# Patient Record
Sex: Male | Born: 1940 | Race: White | Hispanic: No | Marital: Married | Smoking: Former smoker
Health system: Southern US, Community
[De-identification: ages and names within clinical notes are randomized; demographics above are authoritative.]

## PROBLEM LIST (undated history)

## (undated) DIAGNOSIS — I10 Essential (primary) hypertension: Secondary | ICD-10-CM

## (undated) DIAGNOSIS — N210 Calculus in bladder: Secondary | ICD-10-CM

## (undated) DIAGNOSIS — Z87442 Personal history of urinary calculi: Secondary | ICD-10-CM

## (undated) DIAGNOSIS — K635 Polyp of colon: Secondary | ICD-10-CM

## (undated) DIAGNOSIS — H269 Unspecified cataract: Secondary | ICD-10-CM

## (undated) DIAGNOSIS — J302 Other seasonal allergic rhinitis: Secondary | ICD-10-CM

## (undated) DIAGNOSIS — K449 Diaphragmatic hernia without obstruction or gangrene: Secondary | ICD-10-CM

## (undated) DIAGNOSIS — G4730 Sleep apnea, unspecified: Secondary | ICD-10-CM

## (undated) DIAGNOSIS — N400 Enlarged prostate without lower urinary tract symptoms: Secondary | ICD-10-CM

## (undated) DIAGNOSIS — Z8679 Personal history of other diseases of the circulatory system: Secondary | ICD-10-CM

## (undated) DIAGNOSIS — K2270 Barrett's esophagus without dysplasia: Secondary | ICD-10-CM

## (undated) DIAGNOSIS — T7840XA Allergy, unspecified, initial encounter: Secondary | ICD-10-CM

## (undated) DIAGNOSIS — Z974 Presence of external hearing-aid: Secondary | ICD-10-CM

## (undated) DIAGNOSIS — Z87898 Personal history of other specified conditions: Secondary | ICD-10-CM

## (undated) DIAGNOSIS — E785 Hyperlipidemia, unspecified: Secondary | ICD-10-CM

## (undated) DIAGNOSIS — M1990 Unspecified osteoarthritis, unspecified site: Secondary | ICD-10-CM

## (undated) DIAGNOSIS — T783XXA Angioneurotic edema, initial encounter: Secondary | ICD-10-CM

## (undated) DIAGNOSIS — G709 Myoneural disorder, unspecified: Secondary | ICD-10-CM

## (undated) DIAGNOSIS — C801 Malignant (primary) neoplasm, unspecified: Secondary | ICD-10-CM

## (undated) HISTORY — PX: VASECTOMY: SHX75

## (undated) HISTORY — DX: Polyp of colon: K63.5

## (undated) HISTORY — PX: BLADDER STONE REMOVAL: SHX568

## (undated) HISTORY — DX: Diaphragmatic hernia without obstruction or gangrene: K44.9

## (undated) HISTORY — DX: Allergy, unspecified, initial encounter: T78.40XA

## (undated) HISTORY — DX: Unspecified cataract: H26.9

## (undated) HISTORY — DX: Malignant (primary) neoplasm, unspecified: C80.1

## (undated) HISTORY — DX: Unspecified osteoarthritis, unspecified site: M19.90

## (undated) HISTORY — PX: UPPER GASTROINTESTINAL ENDOSCOPY: SHX188

## (undated) HISTORY — DX: Angioneurotic edema, initial encounter: T78.3XXA

## (undated) HISTORY — PX: FRACTURE SURGERY: SHX138

## (undated) HISTORY — DX: Barrett's esophagus without dysplasia: K22.70

---

## 1974-11-07 HISTORY — PX: CERVICAL FUSION: SHX112

## 1999-04-12 ENCOUNTER — Ambulatory Visit (HOSPITAL_COMMUNITY): Admission: RE | Admit: 1999-04-12 | Discharge: 1999-04-12 | Payer: Self-pay | Admitting: *Deleted

## 2000-07-07 ENCOUNTER — Encounter: Payer: Self-pay | Admitting: Neurosurgery

## 2000-07-07 ENCOUNTER — Ambulatory Visit (HOSPITAL_COMMUNITY): Admission: RE | Admit: 2000-07-07 | Discharge: 2000-07-07 | Payer: Self-pay | Admitting: Neurosurgery

## 2001-11-07 HISTORY — PX: QUADRICEPS TENDON REPAIR: SHX756

## 2002-06-09 ENCOUNTER — Inpatient Hospital Stay (HOSPITAL_COMMUNITY): Admission: EM | Admit: 2002-06-09 | Discharge: 2002-06-12 | Payer: Self-pay | Admitting: Emergency Medicine

## 2002-06-09 ENCOUNTER — Encounter: Payer: Self-pay | Admitting: Orthopaedic Surgery

## 2003-03-24 ENCOUNTER — Ambulatory Visit (HOSPITAL_COMMUNITY): Admission: RE | Admit: 2003-03-24 | Discharge: 2003-03-24 | Payer: Self-pay | Admitting: *Deleted

## 2003-03-24 ENCOUNTER — Encounter (INDEPENDENT_AMBULATORY_CARE_PROVIDER_SITE_OTHER): Payer: Self-pay

## 2003-12-24 ENCOUNTER — Encounter (INDEPENDENT_AMBULATORY_CARE_PROVIDER_SITE_OTHER): Payer: Self-pay | Admitting: *Deleted

## 2003-12-24 ENCOUNTER — Ambulatory Visit (HOSPITAL_COMMUNITY): Admission: RE | Admit: 2003-12-24 | Discharge: 2003-12-24 | Payer: Self-pay | Admitting: *Deleted

## 2004-10-25 ENCOUNTER — Ambulatory Visit (HOSPITAL_COMMUNITY): Admission: RE | Admit: 2004-10-25 | Discharge: 2004-10-25 | Payer: Self-pay | Admitting: *Deleted

## 2004-10-25 ENCOUNTER — Encounter (INDEPENDENT_AMBULATORY_CARE_PROVIDER_SITE_OTHER): Payer: Self-pay | Admitting: *Deleted

## 2006-12-04 ENCOUNTER — Ambulatory Visit (HOSPITAL_COMMUNITY): Admission: RE | Admit: 2006-12-04 | Discharge: 2006-12-04 | Payer: Self-pay | Admitting: *Deleted

## 2006-12-04 ENCOUNTER — Encounter (INDEPENDENT_AMBULATORY_CARE_PROVIDER_SITE_OTHER): Payer: Self-pay | Admitting: Specialist

## 2008-10-20 ENCOUNTER — Encounter (INDEPENDENT_AMBULATORY_CARE_PROVIDER_SITE_OTHER): Payer: Self-pay | Admitting: *Deleted

## 2008-10-20 ENCOUNTER — Ambulatory Visit (HOSPITAL_COMMUNITY): Admission: RE | Admit: 2008-10-20 | Discharge: 2008-10-20 | Payer: Self-pay | Admitting: *Deleted

## 2008-11-07 DIAGNOSIS — K2270 Barrett's esophagus without dysplasia: Secondary | ICD-10-CM

## 2008-11-07 HISTORY — DX: Barrett's esophagus without dysplasia: K22.70

## 2010-07-14 ENCOUNTER — Ambulatory Visit (HOSPITAL_BASED_OUTPATIENT_CLINIC_OR_DEPARTMENT_OTHER): Admission: RE | Admit: 2010-07-14 | Discharge: 2010-07-14 | Payer: Self-pay | Admitting: Urology

## 2010-11-07 HISTORY — PX: EYE SURGERY: SHX253

## 2011-01-20 LAB — POCT I-STAT 4, (NA,K, GLUC, HGB,HCT)
Glucose, Bld: 105 mg/dL — ABNORMAL HIGH (ref 70–99)
HCT: 47 % (ref 39.0–52.0)
Hemoglobin: 16 g/dL (ref 13.0–17.0)
Potassium: 3.9 mEq/L (ref 3.5–5.1)
Sodium: 138 mEq/L (ref 135–145)

## 2011-02-14 ENCOUNTER — Other Ambulatory Visit: Payer: Self-pay | Admitting: Gastroenterology

## 2011-03-22 NOTE — Op Note (Signed)
NAMESHARIF, RENDELL NO.:  [id number]   MEDICAL RECORD NO.:  [id number]          PATIENT TYPE:  AMB   LOCATION:  ENDO                         FACILITY:  [facility]   PHYSICIAN:  Georgiana Spinner, M.D.    DATE OF BIRTH:  Feb 12, 1941   DATE OF PROCEDURE:  DATE OF DISCHARGE:                               OPERATIVE REPORT   PROCEDURE:  Upper endoscopy.   INDICATIONS:  GERD with Barrett esophagus.   ANESTHESIA:  Fentanyl 62.5 mcg, Versed 7 mg.   PROCEDURE:  With the patient mildly sedated in the left lateral  decubitus position the Pentax videoscopic endoscope was inserted in the  mouth and passed under direct vision through the esophagus which  appeared normal until we reached distal esophagus and there appeared to  be two areas of Barrett's, photographed and biopsied.  We entered into  the stomach fundus, body, antrum, duodenal bulb, second portion duodenum  and all appeared normal.  From this point, the endoscope was slowly  withdrawn taking circumferential views of duodenal mucosa until the  endoscope had been pulled back into stomach, placed in retroflexion to  view the stomach from below.  The endoscope was then straightened and  withdrawn taking circumferential views of the remaining gastric and  esophageal mucosa.  The patient's vital signs and pulse oximeter  remained stable.  The patient tolerated the procedure well without  apparent complication.   FINDINGS:  Question of Barrett esophagus biopsied.  Await biopsy report.  The patient will call me for results and follow-up with me as an  outpatient.  Proceed to colonoscopy as planned.           ______________________________  Georgiana Spinner, M.D.     GMO/MEDQ  D:  10/20/2008  T:  10/20/2008  Job:  [id number]   cc:   Jonita Albee, M.D.  Fax: [phone]

## 2011-03-22 NOTE — Op Note (Signed)
NAMEANEL, PUROHIT NO.:  [id number]   MEDICAL RECORD NO.:  [id number]          PATIENT TYPE:  AMB   LOCATION:  ENDO                         FACILITY:  [facility]   PHYSICIAN:  Georgiana Spinner, M.D.    DATE OF BIRTH:  [birth year]   DATE OF PROCEDURE:  DATE OF DISCHARGE:                               OPERATIVE REPORT   PROCEDURE:  Colonoscopy.   INDICATIONS:  1. Colon polyps.  2. Colon cancer screening.   ANESTHESIA:  1. Fentanyl 37.5 mcg.  2. Versed 3 mg.   PROCEDURE:  With the patient mildly sedated in the left lateral  decubitus position, a rectal examination was performed which was  unremarkable to my examination.  The prostate felt normal.  Subsequently, the Pentax videoscopic colonoscope was inserted in the  rectum and passed under direct vision to the cecum identified by  ileocecal valve and appendiceal orifice, both of which were  photographed.  From this point, the colonoscope was slowly withdrawn  taking circumferential views of colonic mucosa stopping only in the  rectum which appeared normal on direct and showed hemorrhoids on  retroflexed view.  The endoscope was straightened and withdrawn.  The  patient's vital signs and pulse oximeter remained stable.  The patient  tolerated the procedure well without apparent complications.   FINDINGS:  Internal hemorrhoids, otherwise unremarkable colonoscopic  examination to the cecum.   PLAN:  Repeat examination in 5 years           ______________________________  Georgiana Spinner, M.D.     GMO/MEDQ  D:  10/20/2008  T:  10/20/2008  Job:  [id number]   cc:   Jonita Albee, M.D.  Fax: [phone]

## 2011-03-25 NOTE — Op Note (Signed)
NAME:  Todd [name], Todd [name]                            ACCOUNT NO.:  [id number]   MEDICAL RECORD NO.:  [id number]                   PATIENT TYPE:  INP   LOCATION:  1823                                 FACILITY:  [facility]   PHYSICIAN:  Lubertha Basque. Jerl Santos, M.D.             DATE OF BIRTH:  01/14/1941   DATE OF PROCEDURE:  DATE OF DISCHARGE:                                 OPERATIVE REPORT   DATE OF PROCEDURE:  June 09, 2002   PREOPERATIVE DIAGNOSIS:  Right and left quadriceps tendon ruptures.   POSTOPERATIVE DIAGNOSIS:  Right and left quadriceps tendon ruptures.   OPERATION:  Right and left quadriceps tendon repairs.   ANESTHESIA:  General   SURGEON:  Lubertha Basque. Jerl Santos, M.D.   INDICATIONS FOR PROCEDURE:  The patient is a 70 year old man who fell down  his garage steps this afternoon and sustained bilateral complete quadriceps  tendon ruptures.  He was unable to stand.  [facility] was taken to [facility]  and then to the [facility] Emergency Room where  orthopedics was consulted for treatment.  He was offered bilateral  quadriceps tendon repairs in hopes that he might again walk.  The procedures  were discussed with the patient and informed operative consent was obtained  after discussion and possible complications of, reactions to anesthesia,  infection, and DVT.   DESCRIPTION OF PROCEDURE:  The patient was taken to the operating room suite  where general anesthesia was achieved without difficulty.  He was positioned  supine and prepped and draped in a normal sterile fashion.  After the  administration of preoperative IV antibiotics, the right leg was elevated,  exsanguinated, and a tourniquet inflated about the thigh.  A longitudinal  incision was made in the area of the rupture with dissection down to the  distal quadriceps and the patella.  Some small pieces of bone had been  avulsed with the tendon and these were excised.  The superior pole of the  tendon was then  cleared with a rongeur in the area of our intended repair.  Retinaculum was also torn in both directions.  The knee was thoroughly  irrigated to remove the hemarthrosis.  Ethi bon, #2 was placed in [place]  fashion in the distal quadriceps tendon.  Porous bands eminated for the  rupture down to this tendon.  These were then passed through a total of  three longitudinal drill holes in the patella.  The sutures were tied near  the inferior pole of the patella.  I repaired the retinaculum on both  aspects with #2 Ethibon as well.  The knee was flexed to 90 and the repair  appeared to be stable.  The tourniquet was deflated and the leg became pink  and warm immediately.  The wound was again irrigated, followed by  reapproximation of subcutaneous tissues with 2-0 undyed Vicryl and skin with  staples.  Marcaine  was injected about the wound, followed by Adaptic and a  dry gauze dressing with loose Ace wrap and a knee immobilizer.  At this  point, the left leg was elevated, exsanguinated, and a tourniquet inflated  about the thigh. An identical procedure was done here.  At the end, this was  injected with Marcaine at the end of the case and a sterile dressing and  knee immobilizer were applied.  Estimated blood loss and intraoperative  fluids can be obtained from anesthesia record as can accurate tourniquet  times.    DISPOSITION:  The patient was extubated in the operating room and taken to  the recovery room in stable condition.  Plans were for him to stay at least  overnight with probable discharge home in the morning once he is independent  with transfers and for crutch ambulation in his braces.                                                 Lubertha Basque Jerl Santos, M.D.    PGD/MEDQ  D:  06/09/2002  T:  06/13/2002  Job:  [id number]

## 2011-03-25 NOTE — Op Note (Signed)
NAMECHEYENNE, Todd [name] NO.:  [id number]   MEDICAL RECORD NO.:  [id number]          PATIENT TYPE:  AMB   LOCATION:  ENDO                         FACILITY:  [facility]   PHYSICIAN:  Georgiana Spinner, M.D.    DATE OF BIRTH:  09-02-1941   DATE OF PROCEDURE:  10/25/2004  DATE OF DISCHARGE:                                 OPERATIVE REPORT   PROCEDURE:  Upper endoscopy.   INDICATIONS FOR PROCEDURE:  GERD with known Barrett's esophagus.   ANESTHESIA:  Demerol 50, Versed 6 mg.   DESCRIPTION OF PROCEDURE:  With the patient mildly sedated in the left  lateral decubitus position, the Olympus videoscopic endoscope was inserted  in the mouth and passed under direct vision through the esophagus which  appeared normal until we reached the distal esophagus and then we  photographed this area and took biopsies of areas of presumed Barrett's.  We  entered into the stomach. The fundus, body, antrum, duodenal bulb and second  portion of the duodenum and were visualized. From this point, the endoscope  was slowly withdrawn taking circumferential views of the  duodenal mucosa  until the endoscope had been pulled back into the stomach, placed in  retroflexion to view the stomach from below. The endoscope was then  straightened and withdrawn taking circumferential views of the remaining  gastric and esophageal mucosa. The patient's vital signs and pulse oximeter  remained stable. The patient tolerated the procedure well without apparent  complications.   FINDINGS:  Hiatal hernia with Barrett's esophagus. Await biopsy report. The  patient will call me for results and followup with me as an outpatient.      GMO/MEDQ  D:  10/25/2004  T:  10/25/2004  Job:  [id number]

## 2011-03-25 NOTE — Op Note (Signed)
   NAME:  Todd [name], DOEDEN NO.:  [id number]   MEDICAL RECORD NO.:  [id number]                   PATIENT TYPE:  AMB   LOCATION:  ENDO                                 FACILITY:  [facility]   PHYSICIAN:  Georgiana Spinner, M.D.                 DATE OF BIRTH:  1941-07-25   DATE OF PROCEDURE:  DATE OF DISCHARGE:                                 OPERATIVE REPORT   PROCEDURE:  Upper endoscopy.   INDICATION:  GERD.   ANESTHESIA:  Demerol 60 mg, Versed 6 mg.   DESCRIPTION OF PROCEDURE:  With the patient mildly sedated in the left  lateral decubitus position, the Olympus videoscopic endoscope was inserted  into the mouth and passed under direct vision through the esophagus which  appeared normal except at the distal esophagus there were some changes of  possibly esophagitis photographed and biopsied.  We entered into the stomach  and the fundus, body, antrum, duodenal bulb, and second portion of the  duodenum all were visualized and photographed.  From this point, the  endoscope was slowly withdrawn, taking circumferential views of the duodenal  mucosa until the endoscope was then pulled into the stomach and placed in  retroflexion, viewing the stomach from below.  The endoscope was  straightened and withdrawn, taking circumferential views of the remaining  gastric and esophageal mucosa.  The patient's vital signs and pulse oximetry  remained stable and the patient tolerated the procedure well without  apparent complications.   FINDINGS:  Changes of distal esophagus as noted, biopsied.   PLAN:  Await biopsy report, the patient will call me for results and follow  up with me as an outpatient.  Proceed to colonoscopy as planned.                                               Georgiana Spinner, M.D.    GMO/MEDQ  D:  03/24/2003  T:  03/24/2003  Job:  [id number]   cc:   Jonita Albee, M.D.  [facility]  [address]  [place]  [place] [address]  Fax:  [phone]

## 2011-03-25 NOTE — Op Note (Signed)
   NAME:  Todd [name], Todd [name] NO.:  [id number]   MEDICAL RECORD NO.:  [id number]                   PATIENT TYPE:  AMB   LOCATION:  ENDO                                 FACILITY:  [facility]   PHYSICIAN:  Georgiana Spinner, M.D.                 DATE OF BIRTH:  Aug 16, 1941   DATE OF PROCEDURE:  DATE OF DISCHARGE:                                 OPERATIVE REPORT   PROCEDURE:  Colonoscopy.   INDICATION:  Colon polyp.   ANESTHESIA:  Demerol 20 mg, Versed 2 mg.   DESCRIPTION OF PROCEDURE:  With the patient mildly sedated in the left  lateral decubitus position, the Olympus videoscopic colonoscope was inserted  in the rectum and passed under direct vision to the cecum identified by the  crow's foot of the cecum and the ileocecal valve, both of which were  photographed.  From this point, the colonoscope was slowly withdrawn, taking  circumferential views of the entire colonic mucosa and stopping only in the  rectum which appeared normal on direct and retroflex view.  The endoscope  was straightened and withdrawn.  The patient's vital signs and pulse  oximetry remained stable.  The patient tolerated the procedure well without  apparent complications.   FINDINGS:  Unremarkable colonoscopic examination to the cecum.   PLAN:  Repeat examination possibly in five years.                                               Georgiana Spinner, M.D.    GMO/MEDQ  D:  03/24/2003  T:  03/24/2003  Job:  [id number]   cc:   Jonita Albee, M.D.  [facility]  [address]  [place]  [place] [address]  Fax: [phone]

## 2011-03-25 NOTE — Op Note (Signed)
NAMESTEADMAN, PROSPERI NO.:  [id number]   MEDICAL RECORD NO.:  [id number]          PATIENT TYPE:  AMB   LOCATION:  ENDO                         FACILITY:  [facility]   PHYSICIAN:  Georgiana Spinner, M.D.    DATE OF BIRTH:  1940-12-01   DATE OF PROCEDURE:  DATE OF DISCHARGE:                               OPERATIVE REPORT   PROCEDURE:  Upper endoscopy.   INDICATIONS:  GERD with Barrett's esophagus.   ANESTHESIA:  Fentanyl 50 mcg, Versed 6 mg.   PROCEDURE:  With the patient mildly sedated in the left lateral  decubitus position, the Pentax videoscopic endoscope was inserted into  the mouth and passed under direct vision through the esophagus which  appeared normal until we reached the distal esophagus, and there were  changes of Barrett's photographed and biopsied.  We entered into the  stomach.  The fundus, body, antrum, duodenal bulb, and second portion  duodenum were visualized.  From this point the endoscope was slowly  withdrawn, taking circumferential views of the duodenal mucosa until the  endoscope had been pulled back into the stomach, placed in retroflexion  to view the stomach from below.  The endoscope was then straightened and  withdrawn, taking circumferential views of the remaining gastric and  esophageal mucosa.  The patient's vital signs and pulse oximetry  remained stable.  The patient tolerated the procedure well without  apparent complications.   FINDINGS:  Barrett's esophagus; otherwise, unremarkable exam.  Await  biopsy report.  The patient will call me for results and follow up with  me as an outpatient.           ______________________________  Georgiana Spinner, M.D.     GMO/MEDQ  D:  12/04/2006  T:  12/04/2006  Job:  [id number]

## 2011-03-25 NOTE — Op Note (Signed)
NAME:  Todd [name], Todd [name] NO.:  [id number]   MEDICAL RECORD NO.:  [id number]                   PATIENT TYPE:  AMB   LOCATION:  ENDO                                 FACILITY:  [facility]   PHYSICIAN:  Georgiana Spinner, M.D.                 DATE OF BIRTH:  [birth year]   DATE OF PROCEDURE:  12/24/2003  DATE OF DISCHARGE:                                 OPERATIVE REPORT   PROCEDURE:  Upper endoscopy with biopsy.   INDICATIONS:  Barrett's esophagus, see previous notes.   ANESTHESIA:  Demerol 60 mg, Versed 6 mg.   DESCRIPTION OF PROCEDURE:  With the patient mildly sedated in the left  lateral decubitus position, the Olympus videoscopic endoscope was inserted  the mouth, passed under direct vision through the esophagus.  The distal  esophagus was approached, and there were some possible areas of Barrett's  photographed and biopsied.  We entered into the stomach.  The fundus, body,  antrum, duodenal bulb, second portion of the duodenum appeared normal.  From  this point the endoscope was slowly withdrawn taking circumferential views  of the duodenal mucosa until the endoscope was pulled back into the stomach,  placed in retroflexion to view the stomach from below.  The endoscope was  straightened and withdrawn, taking circumferential views of the remaining  gastric and esophageal mucosa.  The patient's vital signs and pulse oximetry  remained stable.  The patient tolerated the procedure well without apparent  complications.   FINDINGS:  Changes of Barrett's esophagus above a hiatal hernia.   Await biopsy report.  The patient will call me for results and follow up  with me as an outpatient.                                               Georgiana Spinner, M.D.    GMO/MEDQ  D:  12/24/2003  T:  12/24/2003  Job:  [id number]   cc:   Jonita Albee, M.D.  [facility]  [address]  [place]  [place] [address]  Fax: [phone]

## 2011-03-25 NOTE — Discharge Summary (Signed)
   NAME:  Todd [name], Todd [name]                            ACCOUNT NO.:  [id number]   MEDICAL RECORD NO.:  [id number]                   PATIENT TYPE:  INP   LOCATION:  5016                                 FACILITY:  [facility]   PHYSICIAN:  Lubertha Basque. Jerl Santos, M.D.             DATE OF BIRTH:  1941/01/08   DATE OF ADMISSION:  06/09/2002  DATE OF DISCHARGE:  06/12/2002                                 DISCHARGE SUMMARY   ADMITTING DIAGNOSIS:  Bilateral quadriceps tendon ruptures.   DISCHARGE DIAGNOSIS:  Bilateral quadriceps tendon ruptures.   OPERATIONS:  Bilateral quadriceps tendon repair.   BRIEF HISTORY:  This is a 70 year old white male who slipped and fell  unloading his car on the way to the beach on vacation.  He was transported  to the emergency room, at which time he was unable to extend either knee,  unable to bear weight comfortably.  Upon examination and x-ray, it was noted  that he had had bilateral quadriceps tendon ruptures.  Discussed treatment  options with the patient and his wife, that being repair of these quadriceps  tendon ruptures.   PERTINENT LABORATORY AND X-RAY FINDINGS:  He was in a normal sinus rhythm on  an EKG.  Hemoglobin was 11.6, hematocrit 36.4.  INR 1.0.  Other blood  chemistries normal.   HOSPITAL COURSE:  He was admitted through the emergency room, taken to the  operating room for repair of both quadriceps tendons.  He was in knee  immobilizers.  He could bear weight in the brace with the help of physical  therapy and crutches while walking.  He was given appropriate pain  medication and IV antibiotics.  A gram of Ancef q.8h. x3 doses.  Dressings  were changed numerous times in his hospital stay and his wounds were benign.  He was also on Lovenox x7 days 30 mg subcu b.i.d.  Occupational therapist  and physical therapist consultation were also held and he was discharged  home.   CONDITION ON DISCHARGE:  Improved.   PLAN:  We had discontinued his Lovenox, as  he had had some blood in his  urine, and we put him on an 81-mg aspirin per day, and given a prescription  for Percocet for pain.  Arrangements for home equipment was arranged through  physical therapy.  We discussed with him care of his wounds and staying in  his knee immobilizers and weightbearing as tolerated.  He will return to our  office in 7-10 days.      Prince Rome, P.A.                 Lubertha Basque Jerl Santos, M.D.    MRC/MEDQ  D:  06/20/2002  T:  06/24/2002  Job:  [id number]

## 2011-11-13 ENCOUNTER — Ambulatory Visit (INDEPENDENT_AMBULATORY_CARE_PROVIDER_SITE_OTHER): Payer: Medicare Other

## 2011-11-13 DIAGNOSIS — J069 Acute upper respiratory infection, unspecified: Secondary | ICD-10-CM

## 2011-11-13 DIAGNOSIS — R911 Solitary pulmonary nodule: Secondary | ICD-10-CM

## 2011-11-13 DIAGNOSIS — R05 Cough: Secondary | ICD-10-CM

## 2011-12-05 ENCOUNTER — Ambulatory Visit (INDEPENDENT_AMBULATORY_CARE_PROVIDER_SITE_OTHER): Payer: Medicare Other | Admitting: Internal Medicine

## 2011-12-05 DIAGNOSIS — I10 Essential (primary) hypertension: Secondary | ICD-10-CM

## 2011-12-05 DIAGNOSIS — E782 Mixed hyperlipidemia: Secondary | ICD-10-CM

## 2011-12-05 DIAGNOSIS — R05 Cough: Secondary | ICD-10-CM

## 2011-12-05 DIAGNOSIS — M25549 Pain in joints of unspecified hand: Secondary | ICD-10-CM

## 2011-12-05 DIAGNOSIS — R209 Unspecified disturbances of skin sensation: Secondary | ICD-10-CM

## 2011-12-05 DIAGNOSIS — Z888 Allergy status to other drugs, medicaments and biological substances status: Secondary | ICD-10-CM

## 2012-01-12 ENCOUNTER — Other Ambulatory Visit: Payer: Self-pay | Admitting: Orthopedic Surgery

## 2012-01-12 ENCOUNTER — Encounter (HOSPITAL_BASED_OUTPATIENT_CLINIC_OR_DEPARTMENT_OTHER): Payer: Self-pay | Admitting: *Deleted

## 2012-01-12 NOTE — Progress Notes (Signed)
No labs needed

## 2012-01-16 NOTE — H&P (Signed)
Todd [name] is an 71 y.o. male.   Chief Complaint: Complaining of chronic and progressive numbness and tingling bilateral arms and hands. HPI: .Todd [name] is a very active 71 year old right hand dominant retired [employer]. He has had a history of bilateral hand numbness and discomfort dating back to May 2012. He saw you for an evaluation and was very appropriately advised that he more likely than not had carpal tunnel syndrome. He is very active in his retirement. He plays the piano 2-3 hours daily. He enjoys hunting and walking with his bird dog.   Past Medical History  Diagnosis Date  . Hypertension     no meds  . Hyperlipemia   . Neuromuscular disorder     numbness rt hand    Past Surgical History  Procedure Date  . Bladder stone removal 2011  . Colonoscopy   . Upper gastrointestinal endoscopy   . Quadriceps tendon repair 2003    bilateral  . Cervical fusion 1976  . Eye surgery 2012    both cataracts    History reviewed. No pertinent family history. Social History:  reports that he quit smoking about 38 years ago. He does not have any smokeless tobacco history on file. He reports that he drinks alcohol. He reports that he does not use illicit drugs.  Allergies:  Allergies  Allergen Reactions  . Amoxicillin Nausea And Vomiting  . Darvon     Chest pain    No current facility-administered medications on file as of .   Medications Prior to Admission  Medication Sig Dispense Refill  . aspirin 325 MG tablet Take 325 mg by mouth daily.      Marland Kitchen atorvastatin (LIPITOR) 20 MG tablet Take 20 mg by mouth every evening.      . fish oil-omega-3 fatty acids 1000 MG capsule Take 2 g by mouth daily.      . niacin 100 MG tablet Take 100 mg by mouth daily with breakfast.      . saw palmetto 160 MG capsule Take 160 mg by mouth 2 (two) times daily.        No results found for this or any previous visit (from the past 48 hour(s)).  No results  found.   Pertinent items are noted in HPI.  Height 6' (1.829 m), weight 97.523 kg (215 lb).  General appearance: alert Head: Normocephalic, without obvious abnormality Neck: supple, symmetrical, trachea midline Resp: clear to auscultation bilaterally Cardio: regular rate and rhythm, S1, S2 normal, no murmur, click, rub or gallop GI: normal findings: bowel sounds normal Extremities:. Inspection of his hands reveals no intrinsic atrophy. He has marked stiffness of his neck with limited flexion/extension, rotation right and left of approximately 40 degrees. He has full ROM of his fingers in flexion/extension. He has diminished sensibility in the median nerve distribution bilaterally. He has no intrinsic atrophy. There is no sign of stenosing tenosynovitis. His pulses and capillary refill are intact bilaterally. His dermatoglyphics are preserved.  Dr. Johna Roles completed screening electrodiagnostic studies of the median and ulnar nerves. 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 He had moderate left carpal tunnel syndrome and moderate ulnar neuropathy at the left cubital tunnel. There was no evidence of generalized polyneuropathy.  Based on his multiple entrapment neuropathy symptoms and his history of prior neck injury and surgery, screening films of his cervical spine are obtained. 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 He has large posterior projecting osteophytes at C6/7 that compromise his canal diameter moderately.   His deep tendon reflexes are checked in the upper and lower extremities. He is hypoactive in the biceps, triceps, brachioradialis, knee jerks and ankle jerks bilaterally. He has no clonus.   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 Transposition ulnar nerve right cubital tunnel. The procedure, risks and post-op course were discussed with the patient and he was in agreement with the plan.  Todd [name] 01/16/2012, 4:32 PM   H&P documentation: 01/17/2012  -History and Physical Reviewed  -Patient has been re-examined  -No change in the plan of care  [name], MD

## 2012-01-17 ENCOUNTER — Encounter (HOSPITAL_BASED_OUTPATIENT_CLINIC_OR_DEPARTMENT_OTHER): Payer: Self-pay

## 2012-01-17 ENCOUNTER — Encounter (HOSPITAL_BASED_OUTPATIENT_CLINIC_OR_DEPARTMENT_OTHER): Payer: Self-pay | Admitting: Anesthesiology

## 2012-01-17 ENCOUNTER — Ambulatory Visit (HOSPITAL_BASED_OUTPATIENT_CLINIC_OR_DEPARTMENT_OTHER)
Admission: RE | Admit: 2012-01-17 | Discharge: 2012-01-17 | Disposition: A | Discharge status: Home / Self Care | Payer: Medicare Other | Source: Ambulatory Visit | Attending: Orthopedic Surgery | Admitting: Orthopedic Surgery

## 2012-01-17 ENCOUNTER — Ambulatory Visit (HOSPITAL_BASED_OUTPATIENT_CLINIC_OR_DEPARTMENT_OTHER): Payer: Medicare Other | Admitting: Anesthesiology

## 2012-01-17 ENCOUNTER — Encounter (HOSPITAL_BASED_OUTPATIENT_CLINIC_OR_DEPARTMENT_OTHER)
Admission: RE | Disposition: A | Discharge status: Home / Self Care | Payer: Self-pay | Source: Ambulatory Visit | Attending: Orthopedic Surgery

## 2012-01-17 DIAGNOSIS — I10 Essential (primary) hypertension: Secondary | ICD-10-CM | POA: Insufficient documentation

## 2012-01-17 DIAGNOSIS — E785 Hyperlipidemia, unspecified: Secondary | ICD-10-CM | POA: Insufficient documentation

## 2012-01-17 DIAGNOSIS — G5620 Lesion of ulnar nerve, unspecified upper limb: Secondary | ICD-10-CM | POA: Insufficient documentation

## 2012-01-17 DIAGNOSIS — Z01812 Encounter for preprocedural laboratory examination: Secondary | ICD-10-CM | POA: Insufficient documentation

## 2012-01-17 DIAGNOSIS — G5600 Carpal tunnel syndrome, unspecified upper limb: Secondary | ICD-10-CM | POA: Insufficient documentation

## 2012-01-17 HISTORY — PX: ULNAR NERVE TRANSPOSITION: SHX2595

## 2012-01-17 HISTORY — DX: Myoneural disorder, unspecified: G70.9

## 2012-01-17 HISTORY — PX: CARPAL TUNNEL RELEASE: SHX101

## 2012-01-17 HISTORY — DX: Essential (primary) hypertension: I10

## 2012-01-17 HISTORY — DX: Hyperlipidemia, unspecified: E78.5

## 2012-01-17 LAB — POCT HEMOGLOBIN-HEMACUE: Hemoglobin: 15 g/dL (ref 13.0–17.0)

## 2012-01-17 SURGERY — CARPAL TUNNEL RELEASE
Anesthesia: General | Site: Wrist | Laterality: Right | Wound class: Clean

## 2012-01-17 MED ORDER — OXYCODONE-ACETAMINOPHEN 5-325 MG PO TABS: 1.0000 | ORAL_TABLET | ORAL | Status: AC | PRN

## 2012-01-17 MED ORDER — MIDAZOLAM HCL 2 MG/2ML IJ SOLN
0.5000 mg | INTRAMUSCULAR | Status: DC | PRN
  Administered 2012-01-17: 2 mg via INTRAVENOUS

## 2012-01-17 MED ORDER — ONDANSETRON HCL 4 MG/2ML IJ SOLN
INTRAMUSCULAR | Status: DC | PRN
  Administered 2012-01-17: 4 mg via INTRAVENOUS

## 2012-01-17 MED ORDER — LIDOCAINE HCL 1 % IJ SOLN
INTRAMUSCULAR | Status: DC | PRN
  Administered 2012-01-17: 2 mL via INTRADERMAL

## 2012-01-17 MED ORDER — LACTATED RINGERS IV SOLN
INTRAVENOUS | Status: DC
  Administered 2012-01-17 (×2): via INTRAVENOUS

## 2012-01-17 MED ORDER — PROPOFOL 10 MG/ML IV EMUL
INTRAVENOUS | Status: DC | PRN
  Administered 2012-01-17: 250 mg via INTRAVENOUS

## 2012-01-17 MED ORDER — FENTANYL CITRATE 0.05 MG/ML IJ SOLN
50.0000 ug | INTRAMUSCULAR | Status: DC | PRN
  Administered 2012-01-17: 100 ug via INTRAVENOUS

## 2012-01-17 MED ORDER — DOXYCYCLINE HYCLATE 100 MG PO TABS: 100.0000 mg | ORAL_TABLET | Freq: Two times a day (BID) | ORAL | Status: AC

## 2012-01-17 MED ORDER — DEXAMETHASONE SODIUM PHOSPHATE 10 MG/ML IJ SOLN
INTRAMUSCULAR | Status: DC | PRN
  Administered 2012-01-17: 10 mg via INTRAVENOUS

## 2012-01-17 MED ORDER — ROPIVACAINE HCL 5 MG/ML IJ SOLN
INTRAMUSCULAR | Status: DC | PRN
  Administered 2012-01-17: 30 mL via EPIDURAL

## 2012-01-17 MED ORDER — LIDOCAINE HCL (CARDIAC) 20 MG/ML IV SOLN
INTRAVENOUS | Status: DC | PRN
  Administered 2012-01-17: 60 mg via INTRAVENOUS

## 2012-01-17 SURGICAL SUPPLY — 50 items
BANDAGE ADHESIVE 1X3 (GAUZE/BANDAGES/DRESSINGS) IMPLANT
BANDAGE ELASTIC 3 VELCRO ST LF (GAUZE/BANDAGES/DRESSINGS) ×3 IMPLANT
BANDAGE ELASTIC 4 VELCRO ST LF (GAUZE/BANDAGES/DRESSINGS) ×3 IMPLANT
BLADE MINI RND TIP GREEN BEAV (BLADE) ×3 IMPLANT
BLADE SURG 15 STRL LF DISP TIS (BLADE) ×2 IMPLANT
BLADE SURG 15 STRL SS (BLADE) ×1
BNDG ESMARK 4X9 LF (GAUZE/BANDAGES/DRESSINGS) ×3 IMPLANT
BRUSH SCRUB EZ PLAIN DRY (MISCELLANEOUS) ×3 IMPLANT
CLOTH BEACON ORANGE TIMEOUT ST (SAFETY) ×3 IMPLANT
CORDS BIPOLAR (ELECTRODE) ×3 IMPLANT
COVER MAYO STAND STRL (DRAPES) ×3 IMPLANT
COVER TABLE BACK 60X90 (DRAPES) ×3 IMPLANT
CUFF TOURNIQUET SINGLE 18IN (TOURNIQUET CUFF) ×3 IMPLANT
DECANTER SPIKE VIAL GLASS SM (MISCELLANEOUS) IMPLANT
DRAPE EXTREMITY T 121X128X90 (DRAPE) ×3 IMPLANT
DRAPE SURG 17X23 STRL (DRAPES) ×3 IMPLANT
DRSG TEGADERM 4X4.75 (GAUZE/BANDAGES/DRESSINGS) ×3 IMPLANT
GAUZE SPONGE 4X4 12PLY STRL LF (GAUZE/BANDAGES/DRESSINGS) IMPLANT
GLOVE BIO SURGEON STRL SZ 6.5 (GLOVE) ×3 IMPLANT
GLOVE BIOGEL M STRL SZ7.5 (GLOVE) ×3 IMPLANT
GLOVE EXAM NITRILE PF MED BLUE (GLOVE) ×3 IMPLANT
GLOVE ORTHO TXT STRL SZ7.5 (GLOVE) ×3 IMPLANT
GOWN BRE IMP PREV XXLGXLNG (GOWN DISPOSABLE) ×6 IMPLANT
GOWN PREVENTION PLUS XLARGE (GOWN DISPOSABLE) ×3 IMPLANT
GOWN PREVENTION PLUS XXLARGE (GOWN DISPOSABLE) IMPLANT
LOOP VESSEL MAXI BLUE (MISCELLANEOUS) IMPLANT
NEEDLE 27GAX1X1/2 (NEEDLE) IMPLANT
PACK BASIN DAY SURGERY FS (CUSTOM PROCEDURE TRAY) ×3 IMPLANT
PAD CAST 3X4 CTTN HI CHSV (CAST SUPPLIES) ×2 IMPLANT
PADDING CAST ABS 4INX4YD NS (CAST SUPPLIES)
PADDING CAST ABS COTTON 4X4 ST (CAST SUPPLIES) IMPLANT
PADDING CAST COTTON 3X4 STRL (CAST SUPPLIES) ×1
SLEEVE SCD COMPRESS KNEE MED (MISCELLANEOUS) ×3 IMPLANT
SLING ARM FOAM STRAP XLG (SOFTGOODS) ×3 IMPLANT
SPLINT PLASTER CAST XFAST 3X15 (CAST SUPPLIES) ×10 IMPLANT
SPLINT PLASTER XTRA FASTSET 3X (CAST SUPPLIES) ×5
SPONGE GAUZE 4X4 12PLY (GAUZE/BANDAGES/DRESSINGS) IMPLANT
STOCKINETTE 4X48 STRL (DRAPES) ×3 IMPLANT
STRIP CLOSURE SKIN 1/2X4 (GAUZE/BANDAGES/DRESSINGS) ×3 IMPLANT
SUT PROLENE 3 0 PS 2 (SUTURE) ×3 IMPLANT
SUT VIC AB 3-0 X1 27 (SUTURE) IMPLANT
SUT VIC AB 4-0 P-3 18XBRD (SUTURE) IMPLANT
SUT VIC AB 4-0 P3 18 (SUTURE)
SYR 3ML 23GX1 SAFETY (SYRINGE) IMPLANT
SYR BULB 3OZ (MISCELLANEOUS) ×3 IMPLANT
SYR CONTROL 10ML LL (SYRINGE) IMPLANT
TOWEL OR 17X24 6PK STRL BLUE (TOWEL DISPOSABLE) ×6 IMPLANT
TRAY DSU PREP LF (CUSTOM PROCEDURE TRAY) ×3 IMPLANT
UNDERPAD 30X30 INCONTINENT (UNDERPADS AND DIAPERS) ×3 IMPLANT
WATER STERILE IRR 1000ML POUR (IV SOLUTION) ×3 IMPLANT

## 2012-01-17 NOTE — Anesthesia Preprocedure Evaluation (Signed)
Anesthesia Evaluation  Patient identified by MRN, date of birth, ID band Patient awake    Reviewed: Allergy & Precautions, H&P , NPO status , Patient's Chart, lab work & pertinent test results, reviewed documented beta blocker date and time   Airway Mallampati: II TM Distance: >3 FB Neck ROM: full    Dental   Pulmonary neg pulmonary ROS,          Cardiovascular hypertension, On Medications     Neuro/Psych  Neuromuscular disease negative psych ROS   GI/Hepatic negative GI ROS, Neg liver ROS,   Endo/Other  negative endocrine ROS  Renal/GU negative Renal ROS  negative genitourinary   Musculoskeletal   Abdominal   Peds  Hematology negative hematology ROS (+)   Anesthesia Other Findings See surgeon's H&P   Reproductive/Obstetrics negative OB ROS                           Anesthesia Physical Anesthesia Plan  ASA: II  Anesthesia Plan: General   Post-op Pain Management:    Induction: Intravenous  Airway Management Planned: LMA  Additional Equipment:   Intra-op Plan:   Post-operative Plan: Extubation in OR  Informed Consent: I have reviewed the patients History and Physical, chart, labs and discussed the procedure including the risks, benefits and alternatives for the proposed anesthesia with the patient or authorized representative who has indicated his/her understanding and acceptance.     Plan Discussed with: CRNA and Surgeon  Anesthesia Plan Comments:         Anesthesia Quick Evaluation

## 2012-01-17 NOTE — Progress Notes (Signed)
Assisted Dr. Frederick with right, ultrasound guided, supraclavicular block. Side rails up, monitors on throughout procedure. See vital signs in flow sheet. Tolerated Procedure well. 

## 2012-01-17 NOTE — Transfer of Care (Signed)
Immediate Anesthesia Transfer of Care Note  Patient: Todd [name]  Procedure(s) Performed: Procedure(s) (LRB): CARPAL TUNNEL RELEASE (Right) ULNAR NERVE DECOMPRESSION/TRANSPOSITION (Right)  Patient Location: PACU  Anesthesia Type: GA combined with regional for post-op pain  Level of Consciousness: sedated  Airway & Oxygen Therapy: Patient Spontanous Breathing and Patient connected to face mask oxygen  Post-op Assessment: Report given to PACU RN and Post -op Vital signs reviewed and stable  Post vital signs: Reviewed and stable  Complications: No apparent anesthesia complications

## 2012-01-17 NOTE — Discharge Instructions (Signed)
[facility] Instructions Hand Surgery  Wound Care: Keep your hand elevated above the level of your heart.  Do not allow it to dangle  by your side.  Keep the dressing dry and do not remove it unless your doctor advises you to do so.  He will usually change it at the time of your post-op visit.  Moving your fingers is advised to stimulate circulation but will depend on the site of your surgery.  If you have a splint applied, your doctor will advise you regarding movement.  Activity: Do not drive or operate machinery today.  Rest today and then you may return to your normal activity and work as indicated by your physician.  Diet:  Drink liquids today or eat a light diet.  You may resume a regular diet tomorrow.    General expectations: Pain for two to three days. Fingers may become slightly swollen.  Call your doctor if any of the following occur: Severe pain not relieved by pain medication. Elevated temperature. Dressing soaked with blood. Inability to move fingers. White or bluish color to fingers.  Regional Anesthesia Blocks  1. Numbness or the inability to move the "blocked" extremity may last from 3-48 hours after placement. The length of time depends on the medication injected and your individual response to the medication. If the numbness is not going away after 48 hours, call your surgeon.  2. The extremity that is blocked will need to be protected until the numbness is gone and the  Strength has returned. Because you cannot feel it, you will need to take extra care to avoid injury. Because it may be weak, you may have difficulty moving it or using it. You may not know what position it is in without looking at it while the block is in effect.  3. For blocks in the legs and feet, returning to weight bearing and walking needs to be done carefully. You will need to wait until the numbness is entirely gone and the strength has returned. You should be able to move your leg and foot  normally before you try and bear weight or walk. You will need someone to be with you when you first try to ensure you do not fall and possibly risk injury.  4. Bruising and tenderness at the needle site are common side effects and will resolve in a few days.  5. Persistent numbness or new problems with movement should be communicated to the surgeon or the [facility] ([phone]).    [facility]  [address] [place], [place] [address] [phone]   Post Anesthesia Home Care Instructions  Activity: Get plenty of rest for the remainder of the day. A responsible adult should stay with you for 24 hours following the procedure.  For the next 24 hours, DO NOT: -Drive a car -Operate machinery -Drink alcoholic beverages -Take any medication unless instructed by your physician -Make any legal decisions or sign important papers.  Meals: Start with liquid foods such as gelatin or soup. Progress to regular foods as tolerated. Avoid greasy, spicy, heavy foods. If nausea and/or vomiting occur, drink only clear liquids until the nausea and/or vomiting subsides. Call your physician if vomiting continues.  Special Instructions/Symptoms: Your throat may feel dry or sore from the anesthesia or the breathing tube placed in your throat during surgery. If this causes discomfort, gargle with warm salt water. The discomfort should disappear within 24 hours.   

## 2012-01-17 NOTE — Anesthesia Procedure Notes (Addendum)
Anesthesia Regional Block:  Supraclavicular block  Pre-Anesthetic Checklist: ,, timeout performed, Correct Patient, Correct Site, Correct Laterality, Correct Procedure, Correct Position, site marked, Risks and benefits discussed,  Surgical consent,  Pre-op evaluation,  At surgeon's request and post-op pain management  Laterality: Right  Prep: chloraprep       Needles:   Needle Type: Other   (Arrow Echogenic)   Needle Length: 9cm  Needle Gauge: 21    Additional Needles:  Procedures: ultrasound guided Supraclavicular block Narrative:  Start time: 01/17/2012 7:00 AM End time: 01/17/2012 7:07 AM Injection made incrementally with aspirations every 5 mL.  Performed by: Personally  Anesthesiologist: C Frederick  Additional Notes: Ultrasound guidance used to: id relevant anatomy, confirm needle position, local anesthetic spread, avoidance of vascular puncture. Picture saved. No complications. Block performed personally by Janetta Hora. Gelene Mink, MD    Supraclavicular block Procedure Name: LMA Insertion Date/Time: 01/17/2012 7:53 AM Performed by: Burna Cash Pre-anesthesia Checklist: Patient identified, Emergency Drugs available, Suction available and Patient being monitored Patient Re-evaluated:Patient Re-evaluated prior to inductionOxygen Delivery Method: Circle System Utilized Preoxygenation: Pre-oxygenation with 100% oxygen Intubation Type: IV induction Ventilation: Mask ventilation without difficulty LMA: LMA inserted LMA Size: 5.0 Number of attempts: 1 Airway Equipment and Method: bite block Placement Confirmation: positive ETCO2 Tube secured with: Tape Dental Injury: Teeth and Oropharynx as per pre-operative assessment

## 2012-01-17 NOTE — Op Note (Signed)
OP NOTE DICTATED: 01/17/12 [id number]

## 2012-01-17 NOTE — Anesthesia Postprocedure Evaluation (Signed)
Anesthesia Post Note  Patient: Todd [name]  Procedure(s) Performed: Procedure(s) (LRB): CARPAL TUNNEL RELEASE (Right) ULNAR NERVE DECOMPRESSION/TRANSPOSITION (Right)  Anesthesia type: General  Patient location: PACU  Post pain: Pain level controlled  Post assessment: Patient's Cardiovascular Status Stable  Last Vitals:  Filed Vitals:   01/17/12 0915  BP: 129/76  Pulse: 63  Temp:   Resp: 16    Post vital signs: Reviewed and stable  Level of consciousness: alert  Complications: No apparent anesthesia complications

## 2012-01-17 NOTE — Brief Op Note (Signed)
01/17/2012  8:28 AM  PATIENT:  Todd [name]  71 y.o. 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  PROCEDURE:  Procedure(s) (LRB): CARPAL TUNNEL RELEASE (Right) ULNAR NERVE DECOMPRESSION IN SITU RIGHT ARM   SURGEON: [name]., MD   PHYSICIAN ASSISTANT:   ASSISTANTS:Esaias Cleavenger [name],P.A-C   ANESTHESIA:   general  EBL:  Total I/O In: 900 [I.V.:900] Out: -   BLOOD ADMINISTERED:none  DRAINS: none   LOCAL MEDICATIONS USED:  NONE  SPECIMEN:  No Specimen  DISPOSITION OF SPECIMEN:  N/A  COUNTS:  YES  TOURNIQUET:   Total Tourniquet Time Documented: Upper Arm (Right) - 23 minutes  DICTATION: .Other Dictation: Dictation Number [id number]  PLAN OF CARE: Discharge to home after PACU  PATIENT DISPOSITION:  PACU - hemodynamically stable.

## 2012-01-18 ENCOUNTER — Encounter (HOSPITAL_BASED_OUTPATIENT_CLINIC_OR_DEPARTMENT_OTHER): Payer: Self-pay | Admitting: Orthopedic Surgery

## 2012-01-19 NOTE — Op Note (Signed)
NAME:  ,                                 ACCOUNT NO.:  MEDICAL RECORD NO.:  [id number]  LOCATION:                                 FACILITY:  PHYSICIAN:  Katy Fitch. Aloysious Vangieson, M.D.      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  PROCEDURE: 1. Release of right transcarpal ligament. 2. In situ decompression of right ulnar nerve at cubital tunnel.  OPERATING SURGEON:  Katy Fitch. Sache Sane, MD  ASSISTANT:  Marveen Reeks [name], PA-C  ANESTHESIA:  General by LMA supplemented by right interscalene block.  SUPERVISING ANESTHESIOLOGIST:  Janetta Hora. Gelene Mink, M.D.  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 Nili Honda Bellow for evaluation and management of hand and arm numbness.  He is a healthy and very active 71 year old gentleman.  He exercises regularly.  He developed bilateral hand numbness that was impairing his ability to do household activities and play piano which is one of his passions.  Clinical examination suggested that he had degenerative disk disease of cervical spine as well as carpal tunnel syndrome and possible ulnar neuropathy.  Electrodiagnostic studies revealed very significant bilateral carpal tunnel syndrome and bilateral ulnar nerve entrapment at the cubital tunnels.  X-rays of cervical spine demonstrated significant degenerative disk disease.  He had a prior posterior disk procedure by Dr. Milagros Loll many years ago.  He did not show signs of spinal stenosis, however.  He had hypoactive reflexes, no clonus.  We advised him to undergo staged bilateral  median and ulnar nerve release.  He was brought to the operating at this time anticipating that procedure.  Preoperatively, he was reminded of the potential risks and benefits of surgery.  He understands that he will notice a change following carpal tunnel surgery usually within several months; however, ulnar nerve decompression can take up to 18 months to see the full results of surgery.  After informed consent, he was brought to the operating at this time.  PROCEDURE:  The patient was brought to room #2 of the [facility] and placed in supine position on the operating table.  Under Dr. Thornton Dales direct supervision, general anesthesia by LMA technique was induced.  The patient had a scalene block placed in the holding area by Dr. Gelene Mink with ultrasound control leading to very satisfactory anesthesia of the right upper extremity.  A pneumatic tourniquet was applied to proximal right brachium followed by routine Betadine scrub and paint.  The arm was exsanguinated with an Esmarch bandage and the arterial tourniquet on the proximal brachium inflated to 250 mmHg.  Routine surgical time-out was accomplished followed by initiation of surgery.  At the level of the palm, a short incision was fashioned in line of the ring finger.  Subcutaneous tissues were  carefully divided revealing the palmar fascia.  This was split longitudinally in line of its fibers to reveal the common sense branch of the median nerve and superficial palmar arch.  The carpal canal was found with a Penfield 4 elevator followed by use of scissors to release the transcarpal ligament along its ulnar border extending into the distal forearm.  The ulnar bursa was quite hypertrophic.  Bleeding points along the margin of the released ligament were electrocauterized with bipolar current followed by repair of the skin with intradermal 3-0 Prolene suture.  Compressive dressing was applied with Steri-Strips,  sterile gauze, sterile Webril, and later a plaster splint. Attention was then directed to the medial elbow.  The ulnar nerve was palpated posterior to the epicondyle.  A 3 cm incision was fashioned paralleling the path of the ulnar nerve posterior to the epicondyle. Subcutaneous tissues were carefully divided revealing a very hypertrophic fascia at the head of the flexor carpi ulnaris.  The arcuate ligament was identified and released with scissors.  The ulnar nerve was decompressed 6 cm above the epicondyle and 6 cm distal by release of the flexor carpi ulnaris fascia, release of multiple fibrous bands deep to the head of flexor carpi ulnaris, and gentle dissection of multiple vessels crossing the nerve.  Proximally, the brachial fascia was released to the level of the arcade of Struthers.  Bleeding points were electrocauterized with bipolar current followed by examination of the nerve.  I had left a anteriorly based flap of the flexor carpi ulnaris fascia to prevent anterior subluxation of the nerve in flexion.  We were able to arrange the elbow from a 15 degree flexion contracture to further flexion of 130 degrees, noting the nerve remained stable in the groove.  In view of this, I elected to provide a in situ decompression.  It did not appear indications are transposition at this time.  The wound was inspected for bleeding points followed by repair in layers with subcutaneous 3-0 Vicryl and intradermal 3-0 Prolene.  This wound was then dressed with Steri-Strips, sterile gauze, Tegaderm followed by Ace wrap.  A volar plaster splint was applied to the wrist maintaining the wrist in 10 degrees of dorsiflexion.  The tourniquet was released with immediate refill to the fingers and thumb.  The patient was awakened from general anesthesia and transferred to recovery room with stable signs.  We will see him back for followup in our office in approximately 8 days for suture removal  from the palm followed by removal of suture from the elbow at 14-15 days.     Katy Fitch Courtnie Brenes, M.D.     RVS/MEDQ  D:  01/17/2012  T:  01/18/2012  Job:  [id number]  cc:   Jonita Albee, M.D.

## 2012-01-30 ENCOUNTER — Ambulatory Visit (INDEPENDENT_AMBULATORY_CARE_PROVIDER_SITE_OTHER): Payer: Medicare Other | Admitting: Internal Medicine

## 2012-01-30 ENCOUNTER — Encounter: Payer: Self-pay | Admitting: Internal Medicine

## 2012-01-30 VITALS — BP 141/84 | HR 91 | Temp 97.4°F | Resp 16 | Ht 70.5 in | Wt 218.4 lb

## 2012-01-30 DIAGNOSIS — R030 Elevated blood-pressure reading, without diagnosis of hypertension: Secondary | ICD-10-CM

## 2012-01-30 DIAGNOSIS — Z79899 Other long term (current) drug therapy: Secondary | ICD-10-CM

## 2012-01-30 DIAGNOSIS — E7889 Other lipoprotein metabolism disorders: Secondary | ICD-10-CM | POA: Insufficient documentation

## 2012-01-30 NOTE — Progress Notes (Signed)
  Subjective:    Patient ID: Todd [name], male    DOB: Jun 12, 1941, 71 y.o.   MRN: [id number]  HPI See home bps, off meds bp is normal, no meds needed. CTS surgery is healing nicely   Review of Systems See ROS form    Objective:   Physical Exam Normal  Home BPs run 107/67--138/88 ovver 2 months     Assessment & Plan:  CPE August

## 2012-05-03 ENCOUNTER — Other Ambulatory Visit: Payer: Self-pay | Admitting: Internal Medicine

## 2012-06-18 ENCOUNTER — Encounter: Payer: Self-pay | Admitting: Internal Medicine

## 2012-06-18 ENCOUNTER — Ambulatory Visit (INDEPENDENT_AMBULATORY_CARE_PROVIDER_SITE_OTHER): Payer: Medicare Other | Admitting: Internal Medicine

## 2012-06-18 VITALS — BP 126/82 | HR 76 | Temp 98.0°F | Resp 16 | Ht 70.5 in | Wt 223.0 lb

## 2012-06-18 DIAGNOSIS — I10 Essential (primary) hypertension: Secondary | ICD-10-CM

## 2012-06-18 DIAGNOSIS — Z79899 Other long term (current) drug therapy: Secondary | ICD-10-CM

## 2012-06-18 DIAGNOSIS — E782 Mixed hyperlipidemia: Secondary | ICD-10-CM

## 2012-06-18 DIAGNOSIS — Z125 Encounter for screening for malignant neoplasm of prostate: Secondary | ICD-10-CM

## 2012-06-18 DIAGNOSIS — Z0000 Encounter for general adult medical examination without abnormal findings: Secondary | ICD-10-CM

## 2012-06-18 LAB — CBC WITH DIFFERENTIAL/PLATELET
Basophils Absolute: 0 10*3/uL (ref 0.0–0.1)
Eosinophils Absolute: 0.2 10*3/uL (ref 0.0–0.7)
Eosinophils Relative: 4 % (ref 0–5)
MCH: 29.9 pg (ref 26.0–34.0)
MCHC: 35.6 g/dL (ref 30.0–36.0)
MCV: 83.9 fL (ref 78.0–100.0)
Monocytes Absolute: 0.5 10*3/uL (ref 0.1–1.0)
Platelets: 238 10*3/uL (ref 150–400)
RDW: 13.6 % (ref 11.5–15.5)

## 2012-06-18 LAB — POCT URINALYSIS DIPSTICK
Glucose, UA: NEGATIVE
Ketones, UA: NEGATIVE
Leukocytes, UA: NEGATIVE
Protein, UA: NEGATIVE
Spec Grav, UA: 1.02
Urobilinogen, UA: 0.2

## 2012-06-18 LAB — COMPREHENSIVE METABOLIC PANEL
ALT: 35 U/L (ref 0–53)
AST: 26 U/L (ref 0–37)
Alkaline Phosphatase: 81 U/L (ref 39–117)
CO2: 26 mEq/L (ref 19–32)
Sodium: 138 mEq/L (ref 135–145)
Total Bilirubin: 0.9 mg/dL (ref 0.3–1.2)
Total Protein: 6.9 g/dL (ref 6.0–8.3)

## 2012-06-18 LAB — POCT UA - MICROSCOPIC ONLY
Bacteria, U Microscopic: NEGATIVE
Casts, Ur, LPF, POC: NEGATIVE

## 2012-06-18 LAB — LIPID PANEL
LDL Cholesterol: 74 mg/dL (ref 0–99)
Total CHOL/HDL Ratio: 3.8 Ratio
VLDL: 36 mg/dL (ref 0–40)

## 2012-06-18 LAB — IFOBT (OCCULT BLOOD): IFOBT: NEGATIVE

## 2012-06-18 NOTE — Progress Notes (Signed)
  Subjective:    Patient ID: Todd [name], male    DOB: 1941/04/10, 71 y.o.   MRN: [id number]  HPI Doing well. BP controlled on no meds. See scanned hx   Review of Systems See scanned ros    Objective:   Physical Exam  Constitutional: He is oriented to person, place, and time. He appears well-developed and well-nourished.  HENT:  Right Ear: External ear normal.  Left Ear: External ear normal.  Nose: Nose normal.  Eyes: EOM are normal. Pupils are equal, round, and reactive to light.  Neck: Normal range of motion. No thyromegaly present.  Cardiovascular: Normal rate, regular rhythm and normal heart sounds.   Pulmonary/Chest: Effort normal and breath sounds normal.  Abdominal: Soft. He exhibits no mass. There is no tenderness.  Genitourinary: Rectum normal, prostate normal and penis normal.  Musculoskeletal: Normal range of motion.  Lymphadenopathy:    He has no cervical adenopathy.  Neurological: He is alert and oriented to person, place, and time. He has normal reflexes. No cranial nerve deficit. He exhibits normal muscle tone. Coordination normal.  Skin: Skin is warm and dry.  Psychiatric: He has a normal mood and affect.   ekg ok  Results for orders placed in visit on 06/18/12  POCT UA - MICROSCOPIC ONLY      Component Value Range   WBC, Ur, HPF, POC neg     RBC, urine, microscopic 0-1     Bacteria, U Microscopic neg     Mucus, UA neg     Epithelial cells, urine per micros 0-2     Crystals, Ur, HPF, POC neg     Casts, Ur, LPF, POC neg     Yeast, UA neg    POCT URINALYSIS DIPSTICK      Component Value Range   Color, UA yellow     Clarity, UA clear     Glucose, UA neg     Bilirubin, UA neg     Ketones, UA neg     Spec Grav, UA 1.020     Blood, UA neg     pH, UA 7.0     Protein, UA neg     Urobilinogen, UA 0.2     Nitrite, UA neg     Leukocytes, UA Negative    IFOBT (OCCULT BLOOD)      Component Value Range   IFOBT Negative          Assessment & Plan:    Healthy RF meds 16yr

## 2012-06-20 ENCOUNTER — Encounter: Payer: Self-pay | Admitting: *Deleted

## 2012-08-16 ENCOUNTER — Ambulatory Visit (INDEPENDENT_AMBULATORY_CARE_PROVIDER_SITE_OTHER): Payer: Medicare Other | Admitting: Family Medicine

## 2012-08-16 VITALS — BP 132/78 | HR 95 | Temp 97.7°F | Resp 18 | Ht 71.0 in | Wt 222.8 lb

## 2012-08-16 DIAGNOSIS — M5380 Other specified dorsopathies, site unspecified: Secondary | ICD-10-CM

## 2012-08-16 DIAGNOSIS — M545 Low back pain: Secondary | ICD-10-CM

## 2012-08-16 DIAGNOSIS — M62830 Muscle spasm of back: Secondary | ICD-10-CM

## 2012-08-16 LAB — POCT URINALYSIS DIPSTICK
Glucose, UA: NEGATIVE
Leukocytes, UA: NEGATIVE
Protein, UA: NEGATIVE
Spec Grav, UA: 1.02
Urobilinogen, UA: 0.2

## 2012-08-16 LAB — POCT UA - MICROSCOPIC ONLY
Bacteria, U Microscopic: NEGATIVE
Casts, Ur, LPF, POC: NEGATIVE
Crystals, Ur, HPF, POC: NEGATIVE

## 2012-08-16 MED ORDER — CYCLOBENZAPRINE HCL 10 MG PO TABS: 10.0000 mg | ORAL_TABLET | Freq: Three times a day (TID) | ORAL | Status: DC | PRN

## 2012-08-16 MED ORDER — MELOXICAM 7.5 MG PO TABS: 7.5000 mg | ORAL_TABLET | Freq: Every day | ORAL | Status: DC | PRN

## 2012-08-16 NOTE — Patient Instructions (Addendum)

## 2012-08-16 NOTE — Progress Notes (Signed)
Subjective:    Patient ID: Todd [name], male    DOB: May 01, 1941, 71 y.o.   MRN: [id number] Chief Complaint  Patient presents with  . Back Pain    lower back has been hurting off and on since summer time.  thinks it started in the spring with lifting something wrong.  does have two kidney stones but not having any urinary sx    HPI Todd [name] is a delightful 72 yo man who has had left low back pain at his beltline intermittently for sev mos, no numbness, but shoots pain when he begins squatting but doesn't hurt to bend over and touch toes. Does know he had 2 kidney stones 2 yrs prev but does not have any urine sxs. Urine clear and unchanged in freq. He is very active  Runs, hunts, works out, no pain yest and so he did go work out but then felt it today when he started moving again.  Has tried some aleve - yest and today but this morning it has not helped so he decided to come in for eval.   Past Medical History  Diagnosis Date  . Hypertension     no meds  . Hyperlipemia   . Neuromuscular disorder     numbness rt hand   Current Outpatient Prescriptions on File Prior to Visit  Medication Sig Dispense Refill  . aspirin 325 MG tablet Take 325 mg by mouth daily.      Marland Kitchen atorvastatin (LIPITOR) 20 MG tablet TAKE 1 TABLET BY MOUTH AT BEDTIME  30 tablet  7  . fish oil-omega-3 fatty acids 1000 MG capsule Take 2 g by mouth daily.      Marland Kitchen GARLIC PO Take 409 mg by mouth 3 (three) times daily.      Marland Kitchen GLUCOSAMINE PO Take by mouth daily.      . Multiple Vitamin (MULTIVITAMIN) tablet Take 1 tablet by mouth daily.      . niacin 100 MG tablet Take 500 mg by mouth daily with breakfast.       . POLICOSANOL PO Take 20 mg by mouth 2 (two) times daily.      . saw palmetto 160 MG capsule Take 160 mg by mouth 2 (two) times daily.       No current facility-administered medications on file prior to visit.   Allergies  Allergen Reactions  . Amoxicillin Nausea And Vomiting  . Darvon     Chest pain     Review  of Systems  Constitutional: Negative for fever, chills, diaphoresis, activity change, appetite change, fatigue and unexpected weight change.  Cardiovascular: Negative for leg swelling.  Gastrointestinal: Negative for nausea, vomiting, abdominal pain, diarrhea, constipation and blood in stool.  Genitourinary: Positive for flank pain. Negative for dysuria, urgency, frequency, hematuria, decreased urine volume, discharge, enuresis and difficulty urinating.  Musculoskeletal: Positive for myalgias, back pain and arthralgias. Negative for joint swelling and gait problem.  Skin: Negative for rash.  Neurological: Negative for tremors, weakness and numbness.  Hematological: Negative for adenopathy. Does not bruise/bleed easily.  Psychiatric/Behavioral: Negative for sleep disturbance.      BP 132/78  Pulse 95  Temp(Src) 97.7 F (36.5 C) (Oral)  Resp 18  Ht 5\' 11"  (1.803 m)  Wt 222 lb 12.8 oz (101.061 kg)  BMI 31.09 kg/m2  SpO2 96% Objective:   Physical Exam  Constitutional: He is oriented to person, place, and time. He appears well-developed and well-nourished. No distress.  HENT:  Head: Normocephalic and atraumatic.  Cardiovascular: Intact distal pulses.   Pulmonary/Chest: Effort normal.  Musculoskeletal: Normal range of motion. He exhibits tenderness. He exhibits no edema.       Thoracic back: Normal. He exhibits normal range of motion, no tenderness, no bony tenderness, no swelling, no deformity and no spasm.       Lumbar back: He exhibits tenderness and spasm. He exhibits normal range of motion, no bony tenderness, no edema and no deformity.  Negative straight leg raise bilaterally  Neurological: He is alert and oriented to person, place, and time. He has normal strength and normal reflexes. He displays no atrophy. No sensory deficit. He exhibits normal muscle tone. Coordination and gait normal.  Reflex Scores:      Patellar reflexes are 2+ on the right side and 2+ on the left side.       Achilles reflexes are 2+ on the right side and 2+ on the left side. Skin: Skin is warm and dry. No rash noted. He is not diaphoretic. No erythema.  Psychiatric: He has a normal mood and affect. His behavior is normal.       Results for orders placed in visit on 08/16/12  POCT UA - MICROSCOPIC ONLY      Result Value Range   WBC, Ur, HPF, POC 0-1     RBC, urine, microscopic 1-2     Bacteria, U Microscopic neg     Mucus, UA neg     Epithelial cells, urine per micros neg     Crystals, Ur, HPF, POC neg     Casts, Ur, LPF, POC neg     Yeast, UA neg    POCT URINALYSIS DIPSTICK      Result Value Range   Color, UA yellow     Clarity, UA clear     Glucose, UA neg     Bilirubin, UA neg     Ketones, UA neg     Spec Grav, UA 1.020     Blood, UA neg     pH, UA 6.5     Protein, UA neg     Urobilinogen, UA 0.2     Nitrite, UA neg     Leukocytes, UA Negative      Assessment & Plan:   1. Muscle spasm of back    2. Low back pain without sciatica  POCT UA - Microscopic Only   POCT UA - Microscopic Only   POCT urinalysis dipstick   Meds ordered this encounter  Medications         . cyclobenzaprine (FLEXERIL) 10 MG tablet    Sig: Take 1 tablet (10 mg total) by mouth 3 (three) times daily as needed for muscle spasms.    Dispense:  30 tablet    Refill:  0  . meloxicam (MOBIC) 7.5 MG tablet    Sig: Take 1 tablet (7.5 mg total) by mouth daily as needed for pain.    Dispense:  30 tablet    Refill:  1  Start mobic daily with prn flexeril followed by heat and gentle stretching - try to do these tid. May consider gentle massage.

## 2012-11-07 DIAGNOSIS — Z8719 Personal history of other diseases of the digestive system: Secondary | ICD-10-CM

## 2012-11-07 HISTORY — DX: Personal history of other diseases of the digestive system: Z87.19

## 2012-11-20 ENCOUNTER — Ambulatory Visit: Payer: Medicare Other

## 2012-11-20 ENCOUNTER — Encounter: Payer: Self-pay | Admitting: Internal Medicine

## 2012-11-20 ENCOUNTER — Ambulatory Visit (INDEPENDENT_AMBULATORY_CARE_PROVIDER_SITE_OTHER): Payer: Medicare Other | Admitting: Internal Medicine

## 2012-11-20 VITALS — BP 151/83 | HR 83 | Temp 97.7°F | Resp 16 | Ht 73.0 in | Wt 228.6 lb

## 2012-11-20 DIAGNOSIS — IMO0001 Reserved for inherently not codable concepts without codable children: Secondary | ICD-10-CM

## 2012-11-20 DIAGNOSIS — R109 Unspecified abdominal pain: Secondary | ICD-10-CM

## 2012-11-20 DIAGNOSIS — M7918 Myalgia, other site: Secondary | ICD-10-CM

## 2012-11-20 DIAGNOSIS — R102 Pelvic and perineal pain: Secondary | ICD-10-CM

## 2012-11-20 DIAGNOSIS — M79604 Pain in right leg: Secondary | ICD-10-CM

## 2012-11-20 DIAGNOSIS — M79605 Pain in left leg: Secondary | ICD-10-CM

## 2012-11-20 DIAGNOSIS — M79609 Pain in unspecified limb: Secondary | ICD-10-CM

## 2012-11-20 LAB — CK: Total CK: 228 U/L (ref 7–232)

## 2012-11-20 NOTE — Patient Instructions (Addendum)
Back Exercises Back exercises help treat and prevent back injuries. The goal of back exercises is to increase the strength of your abdominal and back muscles and the flexibility of your back. These exercises should be started when you no longer have back pain. Back exercises include:  Pelvic Tilt. Lie on your back with your knees bent. Tilt your pelvis until the lower part of your back is against the floor. Hold this position 5 to 10 sec and repeat 5 to 10 times.  Knee to Chest. Pull first 1 knee up against your chest and hold for 20 to 30 seconds, repeat this with the other knee, and then both knees. This may be done with the other leg straight or bent, whichever feels better.  Sit-Ups or Curl-Ups. Bend your knees 90 degrees. Start with tilting your pelvis, and do a partial, slow sit-up, lifting your trunk only 30 to 45 degrees off the floor. Take at least 2 to 3 seconds for each sit-up. Do not do sit-ups with your knees out straight. If partial sit-ups are difficult, simply do the above but with only tightening your abdominal muscles and holding it as directed.  Hip-Lift. Lie on your back with your knees flexed 90 degrees. Push down with your feet and shoulders as you raise your hips a couple inches off the floor; hold for 10 seconds, repeat 5 to 10 times.  Back arches. Lie on your stomach, propping yourself up on bent elbows. Slowly press on your hands, causing an arch in your low back. Repeat 3 to 5 times. Any initial stiffness and discomfort should lessen with repetition over time.  Shoulder-Lifts. Lie face down with arms beside your body. Keep hips and torso pressed to floor as you slowly lift your head and shoulders off the floor. Do not overdo your exercises, especially in the beginning. Exercises may cause you some mild back discomfort which lasts for a few minutes; however, if the pain is more severe, or lasts for more than 15 minutes, do not continue exercises until you see your caregiver.  Improvement with exercise therapy for back problems is slow.  See your caregivers for assistance with developing a proper back exercise program. Document Released: 12/01/2004 Document Revised: 01/16/2012 Document Reviewed: 08/25/2011 [employer] Patient Information 2013 [employer].  

## 2012-11-20 NOTE — Progress Notes (Signed)
  Subjective:    Patient ID: Todd [name], male    DOB: 11-Oct-1941, 72 y.o.   MRN: [id number]  HPI Has 2 mos of leg and buttock pain, no lbp.Does exercise a lot. Is on lipitor and wonders about side affect, he did stop it with no improvement. Has full rom, mobility, and strength. Home bps 120-130/70-80 x many. Does not have this pain while exercising.  Review of Systems     Objective:   Physical Exam  Vitals reviewed. Constitutional: He is oriented to person, place, and time. He appears well-developed and well-nourished.  Musculoskeletal: Normal range of motion. He exhibits no tenderness.       Right knee: Normal.       Left knee: Normal.       Lumbar back: Normal.  Neurological: He is alert and oriented to person, place, and time. He has normal reflexes. He exhibits normal muscle tone. Coordination normal.   Alert appears well and strong.   [facility] reading (PRIMARY) by  Dr Perrin Maltese. Moderate spondylosis       Assessment & Plan:  Buttock and back pain Stretches

## 2012-11-24 ENCOUNTER — Encounter: Payer: Self-pay | Admitting: *Deleted

## 2012-12-17 ENCOUNTER — Ambulatory Visit (INDEPENDENT_AMBULATORY_CARE_PROVIDER_SITE_OTHER): Payer: Medicare Other | Admitting: Emergency Medicine

## 2012-12-17 VITALS — BP 133/82 | HR 90 | Temp 97.9°F | Resp 18 | Wt 229.0 lb

## 2012-12-17 DIAGNOSIS — K1370 Unspecified lesions of oral mucosa: Secondary | ICD-10-CM

## 2012-12-17 DIAGNOSIS — R070 Pain in throat: Secondary | ICD-10-CM

## 2012-12-17 DIAGNOSIS — K1379 Other lesions of oral mucosa: Secondary | ICD-10-CM

## 2012-12-17 MED ORDER — METHYLPREDNISOLONE SODIUM SUCC 125 MG IJ SOLR
125.0000 mg | Freq: Once | INTRAMUSCULAR | Status: AC
  Administered 2012-12-17: 125 mg via INTRAMUSCULAR

## 2012-12-17 MED ORDER — METHYLPREDNISOLONE ACETATE 80 MG/ML IJ SUSP
120.0000 mg | Freq: Once | INTRAMUSCULAR | Status: AC
  Administered 2012-12-17: 120 mg via INTRAMUSCULAR
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
## 2012-12-22 ENCOUNTER — Other Ambulatory Visit: Payer: Self-pay

## 2013-01-06 ENCOUNTER — Other Ambulatory Visit: Payer: Self-pay | Admitting: Physician Assistant

## 2013-01-06 NOTE — Telephone Encounter (Signed)
Needs office visit before runs out

## 2013-02-10 ENCOUNTER — Ambulatory Visit (INDEPENDENT_AMBULATORY_CARE_PROVIDER_SITE_OTHER): Payer: Medicare Other | Admitting: Emergency Medicine

## 2013-02-10 VITALS — BP 134/72 | HR 98 | Temp 98.5°F | Resp 18 | Ht 73.0 in | Wt 220.0 lb

## 2013-02-10 DIAGNOSIS — L255 Unspecified contact dermatitis due to plants, except food: Secondary | ICD-10-CM

## 2013-02-10 DIAGNOSIS — L259 Unspecified contact dermatitis, unspecified cause: Secondary | ICD-10-CM

## 2013-02-10 MED ORDER — METHYLPREDNISOLONE SODIUM SUCC 125 MG IJ SOLR
125.0000 mg | Freq: Once | INTRAMUSCULAR | Status: AC
  Administered 2013-02-10: 125 mg via INTRAMUSCULAR

## 2013-02-10 MED ORDER — METHYLPREDNISOLONE ACETATE 80 MG/ML IJ SUSP
80.0000 mg | Freq: Once | INTRAMUSCULAR | Status: AC
  Administered 2013-02-10: 80 mg via INTRAMUSCULAR
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
## 2013-02-10 NOTE — Patient Instructions (Addendum)

## 2013-02-14 ENCOUNTER — Ambulatory Visit (INDEPENDENT_AMBULATORY_CARE_PROVIDER_SITE_OTHER): Payer: Medicare Other | Admitting: Internal Medicine

## 2013-02-14 VITALS — BP 132/80 | HR 79 | Temp 97.9°F | Resp 18 | Ht 73.0 in | Wt 220.0 lb

## 2013-02-14 DIAGNOSIS — T783XXD Angioneurotic edema, subsequent encounter: Secondary | ICD-10-CM

## 2013-02-14 DIAGNOSIS — K148 Other diseases of tongue: Secondary | ICD-10-CM

## 2013-02-14 DIAGNOSIS — J384 Edema of larynx: Secondary | ICD-10-CM

## 2013-02-14 DIAGNOSIS — R220 Localized swelling, mass and lump, head: Secondary | ICD-10-CM

## 2013-02-14 DIAGNOSIS — T783XXA Angioneurotic edema, initial encounter: Secondary | ICD-10-CM

## 2013-02-14 MED ORDER — PREDNISONE 10 MG PO TABS: ORAL_TABLET | ORAL | Status: DC

## 2013-02-14 MED ORDER — EPINEPHRINE 0.3 MG/0.3ML IJ DEVI: 0.3000 mg | Freq: Once | INTRAMUSCULAR | Status: DC

## 2013-02-14 MED ORDER — LORATADINE 10 MG PO TABS: 10.0000 mg | ORAL_TABLET | Freq: Every day | ORAL | Status: DC

## 2013-02-14 MED ORDER — CETIRIZINE HCL 10 MG PO TABS
10.0000 mg | ORAL_TABLET | Freq: Every day | ORAL | Status: DC
Start: 2013-02-14 — End: 2013-03-12

## 2013-02-14 MED ORDER — METHYLPREDNISOLONE SODIUM SUCC 125 MG IJ SOLR
125.0000 mg | Freq: Once | INTRAMUSCULAR | Status: AC
  Administered 2013-02-14: 125 mg via INTRAVENOUS

## 2013-02-14 NOTE — Patient Instructions (Addendum)
You have an appt scheduled with the allergist for April 23rd at 9:30 You are on a cancellation list, they will call you if anything opens sooner. They would like for you to discontinue your antihistamines 3 days prior to appt. If you have swelling or mouth symptoms, please take the antihistamines. The office will mail you information. The phone number for the allergist is [phone]. Angioedema Angioedema (AE) is a sudden swelling of the eyelids, lips, lobes of ears, external genitalia, skin, and other parts of the body. AE can happen by itself. It usually begins during the night and is found on awakening. It can happen with hives and other allergic reactions. Attacks can be mild and annoying, or life-threatening if the air passages swell. AE generally occurs in a short time period (over minutes to hours) and gets better in 24 to 48 hours. It usually does not cause any serious problems.  There are 2 different kinds of AE:   Allergic AE.  Nonallergic AE.  There may be an overreaction or direct stimulation of cells that are a part of the immune system (mast cells).  There may be problems with the release of chemicals made by the body that cause swelling and inflammation (kinins). AE due to kinins can be inherited from parents (hereditary), or it can develop on its own (acquired). Acquired AE either shows up before, or along with, certain diseases or is due to the body's immune system attacking parts of the body's own cells (autoimmune). CAUSES  Allergic  AE due to allergic reactions are caused by something that causes the body to react (trigger). Common triggers include:  Foods.  Medicines.  Latex.  Direct contact with certain fruits, vegetables, or animal saliva.  Insect stings. Nonallergic  Mast cell stimulation may be caused by:  Medicines.  Dyes used in X-rays.  The body's own immune system reactions to parts of the body (autoimmune disease).  Possibly, some virus  infections.  AE due to problems with kinins can be hereditary or acquired. Attacks are triggered by:  Mild injury.  Dental work or any surgery.  Stress.  Sudden changes in temperature.  Exercise.  Medicines.  AE due to problems with kinins can also be due to certain medicines, especially blood pressure medicines like angiotensin-converting enzyme (ACE) inhibitors. African Americans are at nearly 5 times greater risk of developing AE than Caucasians from ACE inhibitors. SYMPTOMS  Allergic symptoms:  Non-itchy swelling of the skin. Often the swelling is on the face and lips, but any area of the skin can swell. Sometimes, the swelling can be painful. If hives are present, there is intense itching.  Breathing problems if the air passages swell. Nonallergic symptoms:  If internal organs are involved, there may be:  Nausea.  Abdominal pain.  Vomiting.  Difficulty swallowing.  Difficulty passing urine.  Breathing problems if the air passages swell. Depending on the cause of AE, episodes may:  Only happen once (if triggers are removed or avoided).  Come back in unpredictable patterns.  Repeat for several years and then gradually fade away. DIAGNOSIS  AE is diagnosed by:   Asking questions to find out how fast the symptoms began.  Taking a family history.  Physical exam.  Diagnostic tests. Tests could include:  Allergy skin tests to see if the problem is allergic.  Blood tests to diagnose hereditary and some acquired types of AE.  Other tests to see if there is a hidden disease leading to the AE. TREATMENT  Treatment  depends on the type and cause (if any) of the AE. Allergic  Allergic types of AE are treated with:  Immediate removal of the trigger or medicine (if any).  Epinephrine injection.  Steroids.  Antihistamines.  Hospitalization for severe attacks. Nonallergic  Mast cell stimulation types of AE are treated with:  Immediate removal of the  trigger or medicine (if any).  Epinephrine injection.  Steroids.  Antihistamines.  Hospitalization for severe attacks.  Hereditary AE is treated with:  Medicines to prevent and treat attacks. There is little response to antihistamines, epinephrine, or steroids.  Preventive medicines before dental work or surgery.  Removing or avoiding medicines that trigger attacks.  Hospitalization for severe attacks.  Acquired AE is treated with:  Treating underlying disease (if any).  Medicines to prevent and treat attacks. HOME CARE INSTRUCTIONS   Always carry your emergency allergy treatment medicines with you.  Wear a medical bracelet.  Avoid known triggers. SEEK MEDICAL CARE IF:   You get repeat attacks.  Your attacks are more frequent or more severe despite preventive measures.  You have hereditary AE and are considering having children. It is important to discuss the risks of passing this on to your children. SEEK IMMEDIATE MEDICAL CARE IF:   You have difficulty breathing.  You have difficulty swallowing.  You experience fainting. This condition should be treated immediately. It can be life-threatening if it involves throat swelling. Document Released: 01/02/2002 Document Revised: 01/16/2012 Document Reviewed: 10/23/2008 [facility] Patient Information 2013 [place], [place].
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
## 2013-02-16 ENCOUNTER — Ambulatory Visit (INDEPENDENT_AMBULATORY_CARE_PROVIDER_SITE_OTHER): Payer: Medicare Other | Admitting: Internal Medicine

## 2013-02-16 VITALS — BP 131/72 | HR 78 | Temp 97.4°F | Resp 16 | Ht 70.75 in | Wt 211.2 lb

## 2013-02-16 DIAGNOSIS — T783XXA Angioneurotic edema, initial encounter: Secondary | ICD-10-CM | POA: Insufficient documentation

## 2013-02-16 DIAGNOSIS — Z79899 Other long term (current) drug therapy: Secondary | ICD-10-CM

## 2013-02-16 DIAGNOSIS — T783XXD Angioneurotic edema, subsequent encounter: Secondary | ICD-10-CM

## 2013-02-16 DIAGNOSIS — Z87898 Personal history of other specified conditions: Secondary | ICD-10-CM

## 2013-02-16 NOTE — Progress Notes (Signed)
  Subjective:    Patient ID: Todd [name], male    DOB: July 12, 1941, 72 y.o.   MRN: [id number]  HPI Angioedema resolved, no problems with prednisone and antihistamines. Is scheduled to see allergist in 2 weeks. Further hx his brother has had spontaneous angioedema. Feels well   Review of Systems     Objective:   Physical Exam  Vitals reviewed. Constitutional: He is oriented to person, place, and time. He appears well-nourished.  HENT:  Head: Normocephalic.  Right Ear: External ear normal.  Left Ear: External ear normal.  Nose: Nose normal.  Mouth/Throat: Oropharynx is clear and moist.  Eyes: EOM are normal. Pupils are equal, round, and reactive to light.  Cardiovascular: Normal rate, regular rhythm and normal heart sounds.   Pulmonary/Chest: Effort normal and breath sounds normal. No respiratory distress.  Musculoskeletal: Normal range of motion.  Neurological: He is alert and oriented to person, place, and time. No cranial nerve deficit. He exhibits normal muscle tone. Coordination normal.  Skin: No rash noted. No erythema.  Psychiatric: He has a normal mood and affect.          Assessment & Plan:  See allergist

## 2013-03-12 ENCOUNTER — Emergency Department (HOSPITAL_COMMUNITY): Payer: Medicare Other

## 2013-03-12 ENCOUNTER — Encounter (HOSPITAL_COMMUNITY): Payer: Self-pay | Admitting: *Deleted

## 2013-03-12 ENCOUNTER — Observation Stay (HOSPITAL_COMMUNITY)
Admission: EM | Admit: 2013-03-12 | Discharge: 2013-03-12 | Disposition: A | Discharge status: Home / Self Care | Payer: Medicare Other | Attending: Family Medicine | Admitting: Family Medicine

## 2013-03-12 DIAGNOSIS — G709 Myoneural disorder, unspecified: Secondary | ICD-10-CM | POA: Insufficient documentation

## 2013-03-12 DIAGNOSIS — E785 Hyperlipidemia, unspecified: Secondary | ICD-10-CM | POA: Insufficient documentation

## 2013-03-12 DIAGNOSIS — I10 Essential (primary) hypertension: Secondary | ICD-10-CM | POA: Insufficient documentation

## 2013-03-12 DIAGNOSIS — E789 Disorder of lipoprotein metabolism, unspecified: Secondary | ICD-10-CM

## 2013-03-12 DIAGNOSIS — T783XXA Angioneurotic edema, initial encounter: Secondary | ICD-10-CM

## 2013-03-12 DIAGNOSIS — R209 Unspecified disturbances of skin sensation: Secondary | ICD-10-CM | POA: Insufficient documentation

## 2013-03-12 DIAGNOSIS — E7889 Other lipoprotein metabolism disorders: Secondary | ICD-10-CM

## 2013-03-12 DIAGNOSIS — R079 Chest pain, unspecified: Principal | ICD-10-CM | POA: Insufficient documentation

## 2013-03-12 LAB — CBC WITH DIFFERENTIAL/PLATELET
Basophils Absolute: 0 10*3/uL (ref 0.0–0.1)
Basophils Relative: 0 % (ref 0–1)
MCHC: 36.7 g/dL — ABNORMAL HIGH (ref 30.0–36.0)
Monocytes Absolute: 0.8 10*3/uL (ref 0.1–1.0)
Neutro Abs: 4.4 10*3/uL (ref 1.7–7.7)
Neutrophils Relative %: 68 % (ref 43–77)
Platelets: 221 10*3/uL (ref 150–400)
RDW: 14.1 % (ref 11.5–15.5)
WBC: 6.6 10*3/uL (ref 4.0–10.5)

## 2013-03-12 LAB — COMPREHENSIVE METABOLIC PANEL
ALT: 28 U/L (ref 0–53)
Alkaline Phosphatase: 74 U/L (ref 39–117)
BUN: 19 mg/dL (ref 6–23)
CO2: 27 mEq/L (ref 19–32)
GFR calc Af Amer: 83 mL/min — ABNORMAL LOW (ref >90)
GFR calc non Af Amer: 72 mL/min — ABNORMAL LOW (ref >90)
Glucose, Bld: 107 mg/dL — ABNORMAL HIGH (ref 70–99)
Potassium: 3.5 mEq/L (ref 3.5–5.1)
Sodium: 138 mEq/L (ref 135–145)
Total Bilirubin: 0.4 mg/dL (ref 0.3–1.2)

## 2013-03-12 LAB — CBC
MCH: 30.3 pg (ref 26.0–34.0)
MCV: 82.1 fL (ref 78.0–100.0)
Platelets: 216 10*3/uL (ref 150–400)
RBC: 4.42 MIL/uL (ref 4.22–5.81)
RDW: 14 % (ref 11.5–15.5)

## 2013-03-12 LAB — BASIC METABOLIC PANEL
BUN: 19 mg/dL (ref 6–23)
CO2: 27 mEq/L (ref 19–32)
Calcium: 9 mg/dL (ref 8.4–10.5)
Creatinine, Ser: 1.01 mg/dL (ref 0.50–1.35)
Glucose, Bld: 97 mg/dL (ref 70–99)

## 2013-03-12 LAB — LIPID PANEL: Total CHOL/HDL Ratio: 2.8 RATIO

## 2013-03-12 LAB — TROPONIN I: Troponin I: <0.3 ng/mL (ref <0.30)

## 2013-03-12 MED ORDER — METOPROLOL SUCCINATE ER 50 MG PO TB24: 50.0000 mg | ORAL_TABLET | Freq: Every day | ORAL | Status: DC

## 2013-03-12 MED ORDER — ASPIRIN EC 81 MG PO TBEC: 81.0000 mg | DELAYED_RELEASE_TABLET | Freq: Every evening | ORAL | Status: DC

## 2013-03-12 MED ORDER — EPINEPHRINE 0.3 MG/0.3ML IJ DEVI: 0.3000 mg | Freq: Once | INTRAMUSCULAR | Status: DC

## 2013-03-12 MED ORDER — NITROGLYCERIN 0.4 MG SL SUBL: 0.4000 mg | SUBLINGUAL_TABLET | SUBLINGUAL | Status: DC | PRN

## 2013-03-12 MED ORDER — SAW PALMETTO (SERENOA REPENS) 160 MG PO CAPS: 160.0000 mg | ORAL_CAPSULE | Freq: Two times a day (BID) | ORAL | Status: DC

## 2013-03-12 MED ORDER — ADULT MULTIVITAMIN W/MINERALS CH
1.0000 | ORAL_TABLET | Freq: Every day | ORAL | Status: DC
  Administered 2013-03-12: 1 via ORAL
  Filled 2013-03-12: qty 1

## 2013-03-12 MED ORDER — SODIUM CHLORIDE 0.9 % IV SOLN: 250.0000 mL | INTRAVENOUS | Status: DC | PRN

## 2013-03-12 MED ORDER — ASPIRIN 81 MG PO CHEW: 324.0000 mg | CHEWABLE_TABLET | Freq: Once | ORAL | Status: AC

## 2013-03-12 MED ORDER — ACETAMINOPHEN 325 MG PO TABS: 650.0000 mg | ORAL_TABLET | ORAL | Status: DC | PRN

## 2013-03-12 MED ORDER — SODIUM CHLORIDE 0.9 % IJ SOLN: 3.0000 mL | Freq: Two times a day (BID) | INTRAMUSCULAR | Status: DC

## 2013-03-12 MED ORDER — OMEGA-3 FATTY ACIDS 1000 MG PO CAPS: 1.0000 g | ORAL_CAPSULE | Freq: Two times a day (BID) | ORAL | Status: DC

## 2013-03-12 MED ORDER — ATORVASTATIN CALCIUM 20 MG PO TABS
20.0000 mg | ORAL_TABLET | Freq: Every day | ORAL | Status: DC
  Filled 2013-03-12: qty 1

## 2013-03-12 MED ORDER — HEPARIN SODIUM (PORCINE) 5000 UNIT/ML IJ SOLN
5000.0000 [IU] | Freq: Three times a day (TID) | INTRAMUSCULAR | Status: DC
  Filled 2013-03-12 (×3): qty 1

## 2013-03-12 MED ORDER — OMEGA-3-ACID ETHYL ESTERS 1 G PO CAPS
1.0000 g | ORAL_CAPSULE | Freq: Two times a day (BID) | ORAL | Status: DC
  Administered 2013-03-12: 1 g via ORAL
  Filled 2013-03-12 (×2): qty 1

## 2013-03-12 MED ORDER — ASPIRIN 300 MG RE SUPP: 300.0000 mg | RECTAL | Status: DC

## 2013-03-12 MED ORDER — ASPIRIN 81 MG PO CHEW: 324.0000 mg | CHEWABLE_TABLET | ORAL | Status: DC

## 2013-03-12 MED ORDER — EPINEPHRINE 0.3 MG/0.3ML IJ SOAJ
0.3000 mg | Freq: Once | INTRAMUSCULAR | Status: DC | PRN
  Filled 2013-03-12: qty 0.6

## 2013-03-12 MED ORDER — ASPIRIN EC 81 MG PO TBEC: 81.0000 mg | DELAYED_RELEASE_TABLET | Freq: Every day | ORAL | Status: DC

## 2013-03-12 MED ORDER — SODIUM CHLORIDE 0.9 % IJ SOLN: 3.0000 mL | INTRAMUSCULAR | Status: DC | PRN

## 2013-03-12 MED ORDER — NIACIN 500 MG PO TABS
500.0000 mg | ORAL_TABLET | Freq: Every day | ORAL | Status: DC
  Filled 2013-03-12 (×2): qty 1

## 2013-03-12 MED ORDER — ONE-DAILY MULTI VITAMINS PO TABS: 1.0000 | ORAL_TABLET | Freq: Every day | ORAL | Status: DC

## 2013-03-12 MED ORDER — ONDANSETRON HCL 4 MG/2ML IJ SOLN: 4.0000 mg | Freq: Four times a day (QID) | INTRAMUSCULAR | Status: DC | PRN

## 2013-03-12 NOTE — Progress Notes (Signed)
Utilization review completed. Devi Hopman, RN, BSN. 

## 2013-03-12 NOTE — ED Provider Notes (Signed)
History     CSN: [id number]  Arrival date & time 03/12/13  0032   First MD Initiated Contact with Patient 03/12/13 [phone]      Chief Complaint  Patient presents with  . Chest Pain    (Consider location/radiation/quality/duration/timing/severity/associated sxs/prior treatment) HPI 72 year old male presents to emergency department with complaint of chest tightness, with extension into his jaw.  Symptoms started around 9 PM tonight.  He denies diaphoresis, nausea or shortness of breath.  Patient checked his blood pressure, and noted it was elevated, 140s over 90s.  He reports this is unusual for him.  He denies previous history of coronary disease.  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  He reports recently being seen by allergist, blood tests and scratch test, showing only allergy to mold.  He is taking a baby aspirin a day.  He has history of hyperlipidemia and hypertension.  No family history of coronary disease.  He is nonsmoker.  Patient reports he recently started back on an exercise regimen after being off for 2 weeks due to house renovations.  Initially blamed his jaw and neck pain on exercising this morning.  He reports it was harder to get to his target heart rate when he was on the treadmill this morning.  Past Medical History  Diagnosis Date  . Hypertension     no meds  . Hyperlipemia   . Neuromuscular disorder     numbness rt hand  . Angioedema     Past Surgical History  Procedure Laterality Date  . Bladder stone removal  2011  . Colonoscopy    . Upper gastrointestinal endoscopy    . Quadriceps tendon repair  2003    bilateral  . Cervical fusion  1976  . Eye surgery  2012    both cataracts  . Carpal tunnel release  01/17/2012    Procedure: CARPAL TUNNEL RELEASE;  Surgeon: [name]., MD;  Location: [facility];  Service: Orthopedics;  Laterality: Right;  . Ulnar nerve transposition  01/17/2012    Procedure: ULNAR NERVE  DECOMPRESSION/TRANSPOSITION;  Surgeon: [name]., MD;  Location: [facility];  Service: Orthopedics;  Laterality: Right;  decompression of ulnar nerve only  at right cubital tunnel  . Fracture surgery      No family history on file.  History  Substance Use Topics  . Smoking status: Former Smoker    Quit date: 01/11/1974  . Smokeless tobacco: Not on file  . Alcohol Use: Yes     Comment: occ      Review of Systems  All other systems reviewed and are negative.    Allergies  Amoxicillin and Darvon  Home Medications   Current Outpatient Rx  Name  Route  Sig  Dispense  Refill  . aspirin 325 MG tablet   Oral   Take 81 mg by mouth daily.          Marland Kitchen atorvastatin (LIPITOR) 20 MG tablet      TAKE 1 TABLET BY MOUTH AT BEDTIME   30 tablet   2     Then needs office visit   . cetirizine (ZYRTEC) 10 MG tablet   Oral   Take 1 tablet (10 mg total) by mouth daily.   3 tablet   3   . cyclobenzaprine (FLEXERIL) 10 MG tablet   Oral   Take 1 tablet (10 mg total) by mouth 3 (three) times daily as needed for muscle spasms.  30 tablet   0   . EPINEPHrine (EPIPEN) 0.3 mg/0.3 mL DEVI   Intramuscular   Inject 0.3 mLs (0.3 mg total) into the muscle once.   2 Device   1   . fish oil-omega-3 fatty acids 1000 MG capsule   Oral   Take 2 g by mouth daily.         Marland Kitchen GARLIC PO   Oral   Take 500 mg by mouth 3 (three) times daily.         Marland Kitchen GLUCOSAMINE PO   Oral   Take by mouth daily.         . Multiple Vitamin (MULTIVITAMIN) tablet   Oral   Take 1 tablet by mouth daily.         . naproxen sodium (ANAPROX) 220 MG tablet   Oral   Take 220 mg by mouth 2 (two) times daily with a meal.         . niacin 100 MG tablet   Oral   Take 500 mg by mouth daily with breakfast.          . OVER THE COUNTER MEDICATION      Amidren Andro T-5         . predniSONE (DELTASONE) 10 MG tablet      6-5-02-08-20-   21 tablet   3     BP 134/84   Pulse 76  Temp(Src) 97.4 F (36.3 C) (Oral)  Resp 16  SpO2 96%  Physical Exam  Nursing note and vitals reviewed. Constitutional: He is oriented to person, place, and time. He appears well-developed and well-nourished. He appears distressed (anxious appearing).  HENT:  Head: Normocephalic and atraumatic.  Nose: Nose normal.  Mouth/Throat: Oropharynx is clear and moist.  Eyes: Conjunctivae and EOM are normal. Pupils are equal, round, and reactive to light.  Neck: Normal range of motion. Neck supple. No JVD present. No tracheal deviation present. No thyromegaly present.  Cardiovascular: Normal rate, regular rhythm, normal heart sounds and intact distal pulses.  Exam reveals no gallop and no friction rub.   No murmur heard. Pulmonary/Chest: Effort normal and breath sounds normal. No stridor. No respiratory distress. He has no wheezes. He has no rales. He exhibits no tenderness.  Abdominal: Soft. Bowel sounds are normal. He exhibits no distension and no mass. There is no tenderness. There is no rebound and no guarding.  Musculoskeletal: Normal range of motion. He exhibits no edema and no tenderness.  Lymphadenopathy:    He has no cervical adenopathy.  Neurological: He is alert and oriented to person, place, and time. He exhibits normal muscle tone. Coordination normal.  Skin: Skin is warm and dry. No rash noted. No erythema. No pallor.  Psychiatric: His behavior is normal. Judgment and thought content normal.  Mild anxiety noted.    ED Course  Procedures (including critical care time)  Labs Reviewed  CBC WITH DIFFERENTIAL - Abnormal; Notable for the following:    HCT 38.1 (*)    MCHC 36.7 (*)    All other components within normal limits  COMPREHENSIVE METABOLIC PANEL - Abnormal; Notable for the following:    Glucose, Bld 107 (*)    GFR calc non Af Amer 72 (*)    GFR calc Af Amer 83 (*)    All other components within normal limits  POCT I-STAT TROPONIN I   Dg Chest 2  View  03/12/2013  *RADIOLOGY REPORT*  Clinical Data: 72 year old male with chest pain.  CHEST -  2 VIEW  Comparison: Chest radiographs 06/09/2002.  Findings: Upper limits of normal lung volumes.  Cardiac size and mediastinal contours are within normal limits.  Visualized tracheal air column is within normal limits.  No pneumothorax, pulmonary edema, pleural effusion or confluent pulmonary opacity. No acute osseous abnormality identified.  IMPRESSION: No acute cardiopulmonary abnormality.   Original Report Authenticated By: Erskine Speed, M.D.      Date: 03/12/2013  Rate: 75  Rhythm: normal sinus rhythm  QRS Axis: normal  Intervals: normal  ST/T Wave abnormalities: normal  Conduction Disutrbances:none  Narrative Interpretation:   Old EKG Reviewed: unchanged    1. Chest pain       MDM  71 year old man with chest pressure with radiation into his jaw.  He is currently asymptomatic.  No prior history of same.  EKG without ST elevation.  We'll get labs and chest x-ray, and continue to monitor.       Olivia Mackie, MD 03/12/13 [phone]

## 2013-03-12 NOTE — H&P (Signed)
Family Medicine Teaching [facility] Admission History and Physical Service Pager: [phone]  Patient name: Todd [name] Medical record number: [id number] Date of birth: 1941/08/17 Age: 72 y.o. Gender: male  Primary Care Provider: Tally Due, MD  Chief Complaint: chest pain  Assessment and Plan: Todd [name] is a 72 y.o. year old male with a history of hyperlipidemia and angioedema presenting with with complaint of chest pain.  1. Chest pain: patient with new onset chest pain while sitting in chair. Gives history of some typical features such as location and radiation of pain, though atypical in occurring at rest. Must consider angina/ACS (TIMI score of 2 placing patient at 8% risk at 14 days of: all-cause mortality, new or recurrent MI, or severe recurrent ischemia requiring urgent revascularization), PE (though less likely given stable vital signs and normal O2 sat), aortic dissection (unlikely given stable vitals and no mediastinal enlargement), MSK pain (possible given exercise today following break, though pain not reproducible on exam). -will admit to tele, FMTS, Attending Dr. Leveda Anna -will cycle troponins and obtain am EKG -risk stratify-lipid panel and A1c-last LDL 74 -will continue on ASA 81 mg daily -continue on home lipitor 20 mg daily, fish oil capsule, niacin 500 mg daily -SL nitro prn  2. HLD: patient with last LDL of 74. -continue home statin, fish oil, and niacin per above -will obtain lipid panel  3. H/o angioedema: being evaluated in OP setting by Dr. Perrin Maltese. No respiratory compromise with this, had tongue swelling and voice change. Has not had any issues in the past several weeks. No trigger identified. Has been treated with steroids, zyrtec, and zantac. Also given epi pen. -will continue to monitor for signs of angio edema -epi pen ordered-consider steroids if has issue with this while in the hospital  FEN/GI: heart healthy, SLIV Prophylaxis: SQ  heparin Disposition: admit to tele for CP rule out, discharge pending negative cardiac work-up Code Status: full  History of Present Illness: Todd [name] is a 72 y.o. year old male with a history of hyperlipidemia and angioedema presenting with complaint of chest pain.  Started around 9:30 pm while he was sitting down. Described as tightness in chest in a band across his chest. Then radiated to neck and into back. Additionally notes some jaw pain on way to hospital. When initially had pain patient took Goody powder and the pain eased off, though came back at less intensity until 2 am when it let off completely. Patient checked BP at home and noted to be 140-90 at time of onset of pain, which is abnormal for the patient. Denies diaphoresis and dyspnea. Has never had this occur before. Denies family history of CV issues. Is a former smoker. Notably patient had taken 2-3 weeks off from working out until today. States he initially attributed chest pain to having worked out. He did not have pain while working out, though notes that he had a more difficult time getting to his target HR.  In the ED a POC trop was negative, EKG revealed NSR with no ST or T wave abnormalities, CXR revealed no acute processes. Patient received chewable ASA. Currently having minimal pain.  @ 7:00 AM: He reports his pain is now completely resolved. He is not sure what made it better. He wonders if anxiety contributed to the pain, although he denies new significant stressors; he was watching television at the onset of the chest pain.   Patient Active Problem List   Diagnosis Date Noted  .  Angioedema 02/16/2013  . Lipids abnormal 01/30/2012   Past Medical History: Past Medical History  Diagnosis Date  . Hypertension     no meds  . Hyperlipemia   . Neuromuscular disorder     numbness rt hand  . Angioedema    Past Surgical History: Past Surgical History  Procedure Laterality Date  . Bladder stone removal  2011  .  Colonoscopy    . Upper gastrointestinal endoscopy    . Quadriceps tendon repair  2003    bilateral  . Cervical fusion  1976  . Eye surgery  2012    both cataracts  . Carpal tunnel release  01/17/2012    Procedure: CARPAL TUNNEL RELEASE;  Surgeon: [name]., MD;  Location: [facility];  Service: Orthopedics;  Laterality: Right;  . Ulnar nerve transposition  01/17/2012    Procedure: ULNAR NERVE DECOMPRESSION/TRANSPOSITION;  Surgeon: [name]., MD;  Location: [facility];  Service: Orthopedics;  Laterality: Right;  decompression of ulnar nerve only  at right cubital tunnel  . Fracture surgery     Social History: History  Substance Use Topics  . Smoking status: Former Smoker    Quit date: 01/11/1974  . Smokeless tobacco: Not on file  . Alcohol Use: Yes     Comment: occ  Smoked from high school to age of 72 He lives with his wife at home He is a social alcohol drinker; last drank 2 beers and a glass of wine a couple of days ago at a wedding He denies other drugs  For any additional social history documentation, please refer to relevant sections of EMR.  Family History: No family history on file. Allergies: Allergies  Allergen Reactions  . Amoxicillin Nausea And Vomiting  . Darvon     Chest pain   Current Facility-Administered Medications on File Prior to Encounter  Medication Dose Route Frequency Provider Last Rate Last Dose  . loratadine (CLARITIN) tablet 10 mg  10 mg Oral Daily Jonita Albee, MD       Current Outpatient Prescriptions on File Prior to Encounter  Medication Sig Dispense Refill  . EPINEPHrine (EPIPEN) 0.3 mg/0.3 mL DEVI Inject 0.3 mLs (0.3 mg total) into the muscle once.  2 Device  1  . fish oil-omega-3 fatty acids 1000 MG capsule Take 1 g by mouth 2 (two) times daily.       Marland Kitchen GARLIC PO Take 161 mg by mouth 2 (two) times daily.       Marland Kitchen GLUCOSAMINE PO Take 30 mLs by mouth every morning. 2 oz      . Multiple Vitamin  (MULTIVITAMIN) tablet Take 1 tablet by mouth daily.       Review Of Systems: Per HPI with the following additions: none Otherwise 12 point review of systems was performed and was unremarkable.  Physical Exam: BP 124/81  Pulse 68  Temp(Src) 97.4 F (36.3 C) (Oral)  Resp 20  SpO2 96% Exam: General: NAD, resting comfortably in bed HEENT: NCAT, PERRLA, MMM Cardiovascular: rrr, no mrg Respiratory: NI WOB; CTAB, no crackles or wheezes Chest wall: NT to palpation  Abdomen: s, NT, ND, +BS Extremities: no edema or cyanosis Skin: no lesions noted Neuro: alert, no focal deficits, moves all extremities well, alert and oriented, appropriate to all questions  Labs and Imaging: CBC BMET   Recent Labs Lab 03/12/13 0044  WBC 6.6  HGB 14.0  HCT 38.1*  PLT 221    Recent Labs Lab  03/12/13 0044  NA 138  K 3.5  CL 101  CO2 27  BUN 19  CREATININE 1.03  GLUCOSE 107*  CALCIUM 9.6     Results for orders placed during the hospital encounter of 03/12/13 (from the past 24 hour(s))  CBC WITH DIFFERENTIAL     Status: Abnormal   Collection Time    03/12/13 12:44 AM      Result Value Range   WBC 6.6  4.0 - 10.5 K/uL   RBC 4.68  4.22 - 5.81 MIL/uL   Hemoglobin 14.0  13.0 - 17.0 g/dL   HCT 16.1 (*) 09.6 - 04.5 %   MCV 81.4  78.0 - 100.0 fL   MCH 29.9  26.0 - 34.0 pg   MCHC 36.7 (*) 30.0 - 36.0 g/dL   RDW 40.9  81.1 - 91.4 %   Platelets 221  150 - 400 K/uL   Neutrophils Relative 68  43 - 77 %   Neutro Abs 4.4  1.7 - 7.7 K/uL   Lymphocytes Relative 17  12 - 46 %   Lymphs Abs 1.1  0.7 - 4.0 K/uL   Monocytes Relative 12  3 - 12 %   Monocytes Absolute 0.8  0.1 - 1.0 K/uL   Eosinophils Relative 3  0 - 5 %   Eosinophils Absolute 0.2  0.0 - 0.7 K/uL   Basophils Relative 0  0 - 1 %   Basophils Absolute 0.0  0.0 - 0.1 K/uL  COMPREHENSIVE METABOLIC PANEL     Status: Abnormal   Collection Time    03/12/13 12:44 AM      Result Value Range   Sodium 138  135 - 145 mEq/L   Potassium 3.5  3.5  - 5.1 mEq/L   Chloride 101  96 - 112 mEq/L   CO2 27  19 - 32 mEq/L   Glucose, Bld 107 (*) 70 - 99 mg/dL   BUN 19  6 - 23 mg/dL   Creatinine, Ser 7.82  0.50 - 1.35 mg/dL   Calcium 9.6  8.4 - 95.6 mg/dL   Total Protein 6.6  6.0 - 8.3 g/dL   Albumin 3.7  3.5 - 5.2 g/dL   AST 31  0 - 37 U/L   ALT 28  0 - 53 U/L   Alkaline Phosphatase 74  39 - 117 U/L   Total Bilirubin 0.4  0.3 - 1.2 mg/dL   GFR calc non Af Amer 72 (*) >90 mL/min   GFR calc Af Amer 83 (*) >90 mL/min  POCT I-STAT TROPONIN I     Status: None   Collection Time    03/12/13  1:08 AM      Result Value Range   Troponin i, poc 0.01  0.00 - 0.08 ng/mL   Comment 3            Dg Chest 2 View  03/12/2013 IMPRESSION: No acute cardiopulmonary abnormality.   Original Report Authenticated By: Erskine Speed, M.D.    Marikay Alar, MD 03/12/2013, 4:09 AM  Madolyn Frieze, Marylene Land 03/12/2013, 7:00 AM

## 2013-03-12 NOTE — Progress Notes (Signed)
Pt provided with dc instructions and education. Pt verbalized understanding. Pt has no questions. IV removed with tip intact. Heart monitor cleaned and returned to front. Makynli Stills, RN 

## 2013-03-12 NOTE — Discharge Summary (Signed)
Physician Discharge Summary  Patient ID: EDDY [name] MRN: [id number] DOB: 07-30-1941 Age: 72 y.o.  Admit date: 03/12/2013 Discharge date: 03/12/2013 Admitting Physician: Sanjuana Letters, MD  PCP: Tally Due, MD  Consultants: none     Discharge Diagnosis: Principal Problem:   Chest pain at rest Active Problems:   Lipids abnormal   Angioedema    Hospital Course Todd [name] is a 72 y.o. year old male with a history of hyperlipidemia and angioedema presenting with with complaint of chest pain.   1. Chest pain: patient with new onset chest pain while sitting in chair at home. Gives history of some typical features such as location and radiation of pain, though atypical in occurring at rest. Patient admitted for chest pain rule-out. Troponins were negative x3. EKG negative for ischemic changes. A1c 6.0. LDL 74. Continued ASA 81 mg and lipitor 20 mg. SL nitro prn. Corinda Gubler was contacted to set up cardiology f/u to arrange for outpatient stress test. He was discharged with supply of SL nitro.   2. HLD: LDL 74. Continued home statin, fish oil, and niacin per above.   3. H/o angioedema: being evaluated in OP setting by Dr. Perrin Maltese. No respiratory compromise with this, had tongue swelling and voice change. Has not had any issues in the past several weeks. No trigger identified. Has been treated with steroids, zyrtec, and zantac. Also given epi pen. Monitored for signs of angioedema and had none. Epi pen ordered.  Problem List 1. Chest pain 2. HLD 3. H/o angioedema    Procedures/Imaging:  Dg Chest 2 View  03/12/2013  *RADIOLOGY REPORT*  Clinical Data: 72 year old male with chest pain.  CHEST - 2 VIEW  Comparison: Chest radiographs 06/09/2002.  Findings: Upper limits of normal lung volumes.  Cardiac size and mediastinal contours are within normal limits.  Visualized tracheal air column is within normal limits.  No pneumothorax, pulmonary edema, pleural effusion or confluent pulmonary  opacity. No acute osseous abnormality identified.  IMPRESSION: No acute cardiopulmonary abnormality.   Original Report Authenticated By: Erskine Speed, M.D.     Labs  CBC  Recent Labs Lab 03/12/13 0044 03/12/13 0545  WBC 6.6 5.8  HGB 14.0 13.4  HCT 38.1* 36.3*  PLT 221 216   BMET  Recent Labs Lab 03/12/13 0044 03/12/13 0545  NA 138 138  K 3.5 3.4*  CL 101 101  CO2 27 27  BUN 19 19  CREATININE 1.03 1.01  CALCIUM 9.6 9.0  PROT 6.6  --   BILITOT 0.4  --   ALKPHOS 74  --   ALT 28  --   AST 31  --   GLUCOSE 107* 97   Results for orders placed during the hospital encounter of 03/12/13 (from the past 72 hour(s))  CBC WITH DIFFERENTIAL     Status: Abnormal   Collection Time    03/12/13 12:44 AM      Result Value Range   WBC 6.6  4.0 - 10.5 K/uL   RBC 4.68  4.22 - 5.81 MIL/uL   Hemoglobin 14.0  13.0 - 17.0 g/dL   HCT 14.7 (*) 82.9 - 56.2 %   MCV 81.4  78.0 - 100.0 fL   MCH 29.9  26.0 - 34.0 pg   MCHC 36.7 (*) 30.0 - 36.0 g/dL   RDW 13.0  86.5 - 78.4 %   Platelets 221  150 - 400 K/uL   Neutrophils Relative 68  43 - 77 %   Neutro Abs  4.4  1.7 - 7.7 K/uL   Lymphocytes Relative 17  12 - 46 %   Lymphs Abs 1.1  0.7 - 4.0 K/uL   Monocytes Relative 12  3 - 12 %   Monocytes Absolute 0.8  0.1 - 1.0 K/uL   Eosinophils Relative 3  0 - 5 %   Eosinophils Absolute 0.2  0.0 - 0.7 K/uL   Basophils Relative 0  0 - 1 %   Basophils Absolute 0.0  0.0 - 0.1 K/uL  COMPREHENSIVE METABOLIC PANEL     Status: Abnormal   Collection Time    03/12/13 12:44 AM      Result Value Range   Sodium 138  135 - 145 mEq/L   Potassium 3.5  3.5 - 5.1 mEq/L   Chloride 101  96 - 112 mEq/L   CO2 27  19 - 32 mEq/L   Glucose, Bld 107 (*) 70 - 99 mg/dL   BUN 19  6 - 23 mg/dL   Creatinine, Ser 6.57  0.50 - 1.35 mg/dL   Calcium 9.6  8.4 - 84.6 mg/dL   Total Protein 6.6  6.0 - 8.3 g/dL   Albumin 3.7  3.5 - 5.2 g/dL   AST 31  0 - 37 U/L   ALT 28  0 - 53 U/L   Alkaline Phosphatase 74  39 - 117 U/L    Total Bilirubin 0.4  0.3 - 1.2 mg/dL   GFR calc non Af Amer 72 (*) >90 mL/min   GFR calc Af Amer 83 (*) >90 mL/min   Comment:            The eGFR has been calculated     using the CKD EPI equation.     This calculation has not been     validated in all clinical     situations.     eGFR's persistently     <90 mL/min signify     possible Chronic Kidney Disease.  POCT I-STAT TROPONIN I     Status: None   Collection Time    03/12/13  1:08 AM      Result Value Range   Troponin i, poc 0.01  0.00 - 0.08 ng/mL   Comment 3            Comment: Due to the release kinetics of cTnI,     a negative result within the first hours     of the onset of symptoms does not rule out     myocardial infarction with certainty.     If myocardial infarction is still suspected,     repeat the test at appropriate intervals.  TROPONIN I     Status: None   Collection Time    03/12/13  5:14 AM      Result Value Range   Troponin I <0.30  <0.30 ng/mL   Comment:            Due to the release kinetics of cTnI,     a negative result within the first hours     of the onset of symptoms does not rule out     myocardial infarction with certainty.     If myocardial infarction is still suspected,     repeat the test at appropriate intervals.  BASIC METABOLIC PANEL     Status: Abnormal   Collection Time    03/12/13  5:45 AM      Result Value Range   Sodium 138  135 - 145  mEq/L   Potassium 3.4 (*) 3.5 - 5.1 mEq/L   Chloride 101  96 - 112 mEq/L   CO2 27  19 - 32 mEq/L   Glucose, Bld 97  70 - 99 mg/dL   BUN 19  6 - 23 mg/dL   Creatinine, Ser 9.56  0.50 - 1.35 mg/dL   Calcium 9.0  8.4 - 21.3 mg/dL   GFR calc non Af Amer 73 (*) >90 mL/min   GFR calc Af Amer 85 (*) >90 mL/min   Comment:            The eGFR has been calculated     using the CKD EPI equation.     This calculation has not been     validated in all clinical     situations.     eGFR's persistently     <90 mL/min signify     possible Chronic Kidney  Disease.  CBC     Status: Abnormal   Collection Time    03/12/13  5:45 AM      Result Value Range   WBC 5.8  4.0 - 10.5 K/uL   RBC 4.42  4.22 - 5.81 MIL/uL   Hemoglobin 13.4  13.0 - 17.0 g/dL   HCT 08.6 (*) 57.8 - 46.9 %   MCV 82.1  78.0 - 100.0 fL   MCH 30.3  26.0 - 34.0 pg   MCHC 36.9 (*) 30.0 - 36.0 g/dL   RDW 62.9  52.8 - 41.3 %   Platelets 216  150 - 400 K/uL  LIPID PANEL     Status: None   Collection Time    03/12/13  5:45 AM      Result Value Range   Cholesterol 138  0 - 200 mg/dL   Triglycerides 95  <244 mg/dL   HDL 49  >01 mg/dL   Total CHOL/HDL Ratio 2.8     VLDL 19  0 - 40 mg/dL   LDL Cholesterol 70  0 - 99 mg/dL   Comment:            Total Cholesterol/HDL:CHD Risk     Coronary Heart Disease Risk Table                         Men   Women      1/2 Average Risk   3.4   3.3      Average Risk       5.0   4.4      2 X Average Risk   9.6   7.1      3 X Average Risk  23.4   11.0                Use the calculated Patient Ratio     above and the CHD Risk Table     to determine the patient's CHD Risk.                ATP III CLASSIFICATION (LDL):      <100     mg/dL   Optimal      027-253  mg/dL   Near or Above                        Optimal      130-159  mg/dL   Borderline      664-403  mg/dL   High      >474  mg/dL   Very High       Patient condition at time of discharge/disposition: stable  Disposition-home   Follow up issues: 1. Stress test-patient was supposed to have this scheduled by [facility], please ensure that this occurred 2. Continue to follow angioedema  Discharge follow up:  Follow-up Information   Schedule an appointment as soon as possible for a visit with GUEST, Loretha Stapler, MD.   Contact information:   [address] [place] [address] [phone]       Follow up with [facility]). (Someone from [facility] cardiology will call you to schedule a stress test. If you do not hear from them, please  discuss with Dr. Perrin Maltese or call [facility] cardiology)    Contact information:   [address] [place] [address] [phone]        Discharge Instructions: Please refer to Patient Instructions section of EMR for full details.  Patient was counseled important signs and symptoms that should prompt return to medical care, changes in medications, dietary instructions, activity restrictions, and follow up appointments.    Discharge Medications   Medication List    TAKE these medications       aspirin EC 81 MG tablet  Take 81 mg by mouth every evening.     atorvastatin 20 MG tablet  Commonly known as:  LIPITOR  Take 20 mg by mouth at bedtime.     EPINEPHrine 0.3 mg/0.3 mL Devi  Commonly known as:  EPIPEN  Inject 0.3 mLs (0.3 mg total) into the muscle once.     fish oil-omega-3 fatty acids 1000 MG capsule  Take 1 g by mouth 2 (two) times daily.     GARLIC PO  Take 500 mg by mouth 2 (two) times daily.     GLUCOSAMINE PO  Take 30 mLs by mouth every morning. 2 oz     GOODY HEADACHE PO  Take 1 packet by mouth once.     multivitamin tablet  Take 1 tablet by mouth daily.     niacin 500 MG tablet  Take 500 mg by mouth daily with breakfast.     saw palmetto 160 MG capsule  Take 160 mg by mouth 2 (two) times daily.     VITAMIN C PO  Take 1 capsule by mouth daily.        Marikay Alar, MD of [facility] 03/14/2013 12:13 PM

## 2013-03-12 NOTE — ED Notes (Signed)
03/11/13 - 2100 -  Anterior chest wall pain, radiating to back; took goody powder with relief. Takes a baby asa everyday. Tried Rolaids and didn't begin to touch him.

## 2013-03-12 NOTE — H&P (Signed)
Seen and examined.  Chart reviewed.  Discussed with Dr. Madolyn Frieze.  Agree with admit and her management and documentation. 72 yo male with know GERD (Barrett's esophagitis), high cholesterol well controled on statin admitted for episode of chest pain last a few hours.  No palpitations, nausea, vomiting or diaphoresis.  Today pain free and wonders if anxiety played a role.  Issues: Chest pain.  Ruling out for MI.  Despite few risk factors (age is biggest one), his story is good enough that I would favor stress test - which can be done as outpatient provided it can be promptly arranged.  Of note, Todd [name] is working on his [employer].  He would like the stress test to be arranged promptly.  He has a ride in a [employer] car and [place] trip [place] planned this [place].

## 2013-03-12 NOTE — ED Notes (Signed)
MD Otter at bedside. 

## 2013-03-14 ENCOUNTER — Encounter: Payer: Self-pay | Admitting: Cardiovascular Disease

## 2013-03-14 ENCOUNTER — Ambulatory Visit (INDEPENDENT_AMBULATORY_CARE_PROVIDER_SITE_OTHER): Payer: Medicare Other | Admitting: Cardiovascular Disease

## 2013-03-14 VITALS — BP 128/76 | HR 70 | Ht 72.0 in | Wt 210.0 lb

## 2013-03-14 DIAGNOSIS — R079 Chest pain, unspecified: Secondary | ICD-10-CM

## 2013-03-14 NOTE — Discharge Summary (Signed)
FMTS Attending Admission Note: Todd Mccole,MD I  have seen and examined this patient, reviewed their chart. I have discussed this patient with the resident. I agree with the resident's findings, assessment and care plan.  

## 2013-03-14 NOTE — Patient Instructions (Signed)
Your physician recommends that you schedule a follow-up appointment in:  5-6 weeks.  Your physician has requested that you have an exercise stress myoview. For further information please visit [email or web address] Please follow instruction sheet, as given.  Your physician has requested that you have an echocardiogram. Echocardiography is a painless test that uses sound waves to create images of your heart. It provides your doctor with information about the size and shape of your heart and how well your heart's chambers and valves are working. This procedure takes approximately one hour. There are no restrictions for this procedure.

## 2013-03-14 NOTE — Progress Notes (Signed)
History of Present Illness: 72 yo male with history of HTN, HLD admitted to [facility] 03/12/13 with chest pain. He ruled out for MI with serial markers. He was managed by the [facility] service. Discharged home on 03/12/13 with plans for cardiac follow up. He has had recent angioedema treated in primary care. This seems to be getting better. He tells me that he worked out three days ago, did well with workout without chest pain or SOB. Later that night he was watching TV and had a band-like tightness across his chest with radiation to his jaws that lasted for several hours and resolved in the hospital.   He has felt well since discharge without recurrent chest pains or SOB since discharge. He exercised yesterday without chest pain.   Primary Care Physician: Robert Bellow  Last Lipid Profile:Lipid Panel     Component Value Date/Time   CHOL 138 03/12/2013 0545   TRIG 95 03/12/2013 0545   HDL 49 03/12/2013 0545   CHOLHDL 2.8 03/12/2013 0545   VLDL 19 03/12/2013 0545   LDLCALC 70 03/12/2013 0545     Past Medical History  Diagnosis Date  . Hypertension     no meds  . Hyperlipemia   . Neuromuscular disorder     numbness rt hand  . Angioedema     Past Surgical History  Procedure Laterality Date  . Bladder stone removal  2011  . Colonoscopy    . Upper gastrointestinal endoscopy    . Quadriceps tendon repair  2003    bilateral  . Cervical fusion  1976  . Eye surgery  2012    both cataracts  . Carpal tunnel release  01/17/2012    Procedure: CARPAL TUNNEL RELEASE;  Surgeon: [name]., MD;  Location: [facility];  Service: Orthopedics;  Laterality: Right;  . Ulnar nerve transposition  01/17/2012    Procedure: ULNAR NERVE DECOMPRESSION/TRANSPOSITION;  Surgeon: [name]., MD;  Location: [facility];  Service: Orthopedics;  Laterality: Right;  decompression of ulnar nerve only  at right cubital tunnel  . Fracture surgery      Current Outpatient  Prescriptions  Medication Sig Dispense Refill  . Ascorbic Acid (VITAMIN C PO) Take 1 capsule by mouth daily.      Marland Kitchen aspirin EC 81 MG tablet Take 81 mg by mouth every evening.      . Aspirin-Acetaminophen-Caffeine (GOODY HEADACHE PO) Take 1 packet by mouth once.      Marland Kitchen atorvastatin (LIPITOR) 20 MG tablet Take 20 mg by mouth at bedtime.      Marland Kitchen EPINEPHrine (EPIPEN) 0.3 mg/0.3 mL DEVI Inject 0.3 mLs (0.3 mg total) into the muscle once.  2 Device  1  . fish oil-omega-3 fatty acids 1000 MG capsule Take 1 g by mouth 2 (two) times daily.       . fluticasone (FLONASE) 50 MCG/ACT nasal spray AS NEEDED      . GARLIC PO Take 161 mg by mouth 2 (two) times daily.       Marland Kitchen GLUCOSAMINE PO Take 30 mLs by mouth every morning. 2 oz      . Multiple Vitamin (MULTIVITAMIN) tablet Take 1 tablet by mouth daily.      . niacin 500 MG tablet Take 500 mg by mouth daily with breakfast.      . predniSONE (DELTASONE) 10 MG tablet AS DIRECTED      . saw palmetto 160 MG capsule Take 160 mg by  mouth 2 (two) times daily.       Current Facility-Administered Medications  Medication Dose Route Frequency Provider Last Rate Last Dose  . loratadine (CLARITIN) tablet 10 mg  10 mg Oral Daily Jonita Albee, MD        Allergies  Allergen Reactions  . Amoxicillin Nausea And Vomiting  . Darvon     Chest pain    History   Social History  . Marital Status: Married    Spouse Name: N/A    Number of Children: 2  . Years of Education: N/A   Occupational History  . Retired    Social History Main Topics  . Smoking status: Former Smoker -- 2.00 packs/day for 15 years    Types: Cigarettes    Quit date: 01/11/1974  . Smokeless tobacco: Not on file  . Alcohol Use: 0.5 oz/week    1 drink(s) per week     Comment: occ  . Drug Use: No  . Sexually Active: No   Other Topics Concern  . Not on file   Social History Narrative  . No narrative on file    Family History  Problem Relation Age of Onset  . Cirrhosis Father   .  Heart failure Mother     in 55s    Review of Systems:  As stated in the HPI and otherwise negative.   BP 128/76  Pulse 70  Ht 6' (1.829 m)  Wt 210 lb (95.255 kg)  BMI 28.47 kg/m2  Physical Examination: General: Well developed, well nourished, NAD HEENT: OP clear, mucus membranes moist SKIN: warm, dry. No rashes. Neuro: No focal deficits Musculoskeletal: Muscle strength 5/5 all ext Psychiatric: Mood and affect normal Neck: No JVD, no carotid bruits, no thyromegaly, no lymphadenopathy. Lungs:Clear bilaterally, no wheezes, rhonci, crackles Cardiovascular: Regular rate and rhythm. No murmurs, gallops or rubs. Abdomen:Soft. Bowel sounds present. Non-tender.  Extremities: No lower extremity edema. Pulses are 2 + in the bilateral DP/PT.  EKG: 03/12/13: NSR, no ST changes.   Assessment and Plan:   1. Chest pain:  His risk factors for CAD include age and HTN but BP well controlled. Symptoms have some typical and atypical features. Will arrange an exercise stress myoview to exclude ischemia. Will arrange echo to exclude structural heart disease.

## 2013-03-15 ENCOUNTER — Ambulatory Visit (HOSPITAL_COMMUNITY): Payer: Medicare Other | Attending: Cardiovascular Disease

## 2013-03-15 DIAGNOSIS — R079 Chest pain, unspecified: Secondary | ICD-10-CM | POA: Insufficient documentation

## 2013-03-15 DIAGNOSIS — I10 Essential (primary) hypertension: Secondary | ICD-10-CM | POA: Insufficient documentation

## 2013-03-15 DIAGNOSIS — R072 Precordial pain: Secondary | ICD-10-CM

## 2013-03-15 DIAGNOSIS — Z87891 Personal history of nicotine dependence: Secondary | ICD-10-CM | POA: Insufficient documentation

## 2013-03-15 DIAGNOSIS — E785 Hyperlipidemia, unspecified: Secondary | ICD-10-CM | POA: Insufficient documentation

## 2013-03-15 NOTE — Progress Notes (Signed)
Echocardiogram performed.  

## 2013-03-19 ENCOUNTER — Ambulatory Visit (HOSPITAL_COMMUNITY): Payer: Medicare Other | Attending: Cardiology | Admitting: Radiology

## 2013-03-19 VITALS — BP 136/80 | HR 74 | Ht 72.0 in | Wt 208.0 lb

## 2013-03-19 DIAGNOSIS — I10 Essential (primary) hypertension: Secondary | ICD-10-CM | POA: Insufficient documentation

## 2013-03-19 DIAGNOSIS — E785 Hyperlipidemia, unspecified: Secondary | ICD-10-CM | POA: Insufficient documentation

## 2013-03-19 DIAGNOSIS — Z87891 Personal history of nicotine dependence: Secondary | ICD-10-CM | POA: Insufficient documentation

## 2013-03-19 DIAGNOSIS — R0789 Other chest pain: Secondary | ICD-10-CM | POA: Insufficient documentation

## 2013-03-19 DIAGNOSIS — I4949 Other premature depolarization: Secondary | ICD-10-CM

## 2013-03-19 DIAGNOSIS — R079 Chest pain, unspecified: Secondary | ICD-10-CM

## 2013-03-19 MED ORDER — TECHNETIUM TC 99M SESTAMIBI GENERIC - CARDIOLITE
30.0000 | Freq: Once | INTRAVENOUS | Status: AC | PRN
  Administered 2013-03-19: 30 via INTRAVENOUS

## 2013-03-19 MED ORDER — TECHNETIUM TC 99M SESTAMIBI GENERIC - CARDIOLITE
10.0000 | Freq: Once | INTRAVENOUS | Status: AC | PRN
  Administered 2013-03-19: 10 via INTRAVENOUS

## 2013-03-19 NOTE — Progress Notes (Signed)
  [facility] SITE 3 NUCLEAR MED [address] [place], [place] [address] [phone]    Cardiology Nuclear Med Study  Todd [name] is a 72 y.o. male     MRN : [id number]     DOB: Mar 31, 1941  Procedure Date: 03/19/2013  Nuclear Med Background Indication for Stress Test:  Evaluation for Ischemia and Post Hospital on 03/12/13 [facility] with Chest Pain and (-) Enzymes History:  ~25 yrs ago ION:GEXBMW per patient (no report) Cardiac Risk Factors: History of Smoking, Hypertension and Lipids  Symptoms:  Chest Tightness>Back/Jaw (last episode of chest discomfort has been none since discharge)   Nuclear Pre-Procedure Caffeine/Decaff Intake:  None NPO After: 7:00pm   Lungs:  Clear. O2 Sat: 95% on room air. IV 0.9% NS with Angio Cath:  20g  IV Site: R Antecubital  IV Started by:  Irean Hong, RN  Chest Size (in):  50 Cup Size: n/a  Height: 6' (1.829 m)  Weight:  208 lb (94.348 kg)  BMI:  Body mass index is 28.2 kg/(m^2). Tech Comments:  n/a    Nuclear Med Study 1 or 2 day study: 1 day  Stress Test Type:  Stress  Reading MD: Cassell Clement, MD  Order Authorizing Provider:  Verne Carrow, MD  Resting Radionuclide: Technetium 2m Sestamibi  Resting Radionuclide Dose: 11.0 mCi   Stress Radionuclide:  Technetium 48m Sestamibi  Stress Radionuclide Dose: 33.0 mCi           Stress Protocol Rest HR: 74 Stress HR: 139  Rest BP: 136/80 Stress BP: 170/75  Exercise Time (min): 10:15 METS: 11.5   Predicted Max HR: 150 bpm % Max HR: 92.67 bpm Rate Pressure Product: 41324   Dose of Adenosine (mg):  n/a Dose of Lexiscan: n/a mg  Dose of Atropine (mg): n/a Dose of Dobutamine: n/a mcg/kg/min (at max HR)  Stress Test Technologist: Smiley Houseman, CMA-N  Nuclear Technologist:  Domenic Polite, CNMT     Rest Procedure:  Myocardial perfusion imaging was performed at rest 45 minutes following the intravenous administration of Technetium 55m Sestamibi.  Rest ECG: NSR - Normal  EKG  Stress Procedure:  The patient exercised on the treadmill utilizing the Bruce Protocol for 10:15 minutes. The patient stopped due to fatigue and denied any chest pain.  Technetium 11m Sestamibi was injected at peak exercise and myocardial perfusion imaging was performed after a brief delay.  Stress ECG: No significant change from baseline ECG  QPS Raw Data Images:  Normal; no motion artifact; normal heart/lung ratio. Stress Images:  Normal homogeneous uptake in all areas of the myocardium. Rest Images:  Normal homogeneous uptake in all areas of the myocardium. Subtraction (SDS):  No evidence of ischemia. Transient Ischemic Dilatation (Normal <1.22):  0.93 Lung/Heart Ratio (Normal <0.45):  0.27  Quantitative Gated Spect Images QGS EDV:  82 ml QGS ESV:  34 ml  Impression Exercise Capacity:  Good exercise capacity. BP Response:  Normal blood pressure response. Clinical Symptoms:  No chest pain. ECG Impression:  No significant ST segment change suggestive of ischemia. Comparison with Prior Nuclear Study: No images to compare  Overall Impression:  Normal stress nuclear study.  LV Ejection Fraction: 58%.  LV Wall Motion:  NL LV Function; NL Wall Motion  [employer]

## 2013-05-07 ENCOUNTER — Other Ambulatory Visit: Payer: Self-pay | Admitting: Physician Assistant

## 2013-05-07 ENCOUNTER — Encounter: Payer: Self-pay | Admitting: *Deleted

## 2013-05-08 ENCOUNTER — Ambulatory Visit (INDEPENDENT_AMBULATORY_CARE_PROVIDER_SITE_OTHER): Payer: Medicare Other | Admitting: Cardiovascular Disease

## 2013-05-08 ENCOUNTER — Encounter: Payer: Self-pay | Admitting: Cardiovascular Disease

## 2013-05-08 VITALS — BP 121/79 | HR 88 | Ht 72.0 in | Wt 222.0 lb

## 2013-05-08 DIAGNOSIS — R079 Chest pain, unspecified: Secondary | ICD-10-CM

## 2013-05-08 NOTE — Patient Instructions (Addendum)
Your physician recommends that you schedule a follow-up appointment  As needed with Dr. McAlhany  

## 2013-05-08 NOTE — Progress Notes (Signed)
History of Present Illness: 72 yo male with history of HTN, HLD and chest pain here today for cardiac follow up. He was admitted to [facility] 03/12/13 with chest pain. He ruled out for MI with serial markers. He was managed by the [facility] Teaching service. Discharged home on 03/12/13 with plans for cardiac follow up. I saw him 03/14/13 for the first time in the office. He had recent angioedema treated in primary care. He described the episode of chest pain which prompted ED visit as band-like tightness across his chest with radiation to his jaws that lasted for several hours and resolved in the hospital. I arranged an echo and exercise stress myoview. Exercise stress myoview 03/19/13 with no evidence of ischemia. He exercised for 10 minutes. Echo 03/15/13 overall normal.   He is here today for follow up. He has been feeling well. No recurrent chest pain. No SOB. He is very active.   Primary Care Physician: Robert Bellow  Last Lipid Profile:Lipid Panel     Component Value Date/Time   CHOL 138 03/12/2013 0545   TRIG 95 03/12/2013 0545   HDL 49 03/12/2013 0545   CHOLHDL 2.8 03/12/2013 0545   VLDL 19 03/12/2013 0545   LDLCALC 70 03/12/2013 0545     Past Medical History  Diagnosis Date  . Hypertension     no meds  . Hyperlipemia   . Neuromuscular disorder     numbness rt hand  . Angioedema     Past Surgical History  Procedure Laterality Date  . Bladder stone removal  2011  . Colonoscopy    . Upper gastrointestinal endoscopy    . Quadriceps tendon repair  2003    bilateral  . Cervical fusion  1976  . Eye surgery  2012    both cataracts  . Carpal tunnel release  01/17/2012    Procedure: CARPAL TUNNEL RELEASE;  Surgeon: [name]., MD;  Location: [facility];  Service: Orthopedics;  Laterality: Right;  . Ulnar nerve transposition  01/17/2012    Procedure: ULNAR NERVE DECOMPRESSION/TRANSPOSITION;  Surgeon: [name]., MD;  Location: [facility];   Service: Orthopedics;  Laterality: Right;  decompression of ulnar nerve only  at right cubital tunnel  . Fracture surgery      Current Outpatient Prescriptions  Medication Sig Dispense Refill  . Ascorbic Acid (VITAMIN C PO) Take 1 capsule by mouth daily.      Marland Kitchen aspirin EC 81 MG tablet Take 81 mg by mouth every evening.      . Aspirin-Acetaminophen-Caffeine (GOODY HEADACHE PO) Take 1 packet by mouth once. As needed      . atorvastatin (LIPITOR) 20 MG tablet Take 20 mg by mouth at bedtime.      . cetirizine (ZYRTEC) 10 MG tablet Take 10 mg by mouth daily.      Marland Kitchen EPINEPHrine (EPIPEN) 0.3 mg/0.3 mL DEVI Inject 0.3 mLs (0.3 mg total) into the muscle once.  2 Device  1  . fish oil-omega-3 fatty acids 1000 MG capsule Take 1 g by mouth 2 (two) times daily.       . fluticasone (FLONASE) 50 MCG/ACT nasal spray AS NEEDED      . GARLIC PO Take 161 mg by mouth 2 (two) times daily.       Marland Kitchen GLUCOSAMINE PO Take 30 mLs by mouth every morning. 2 oz      . Multiple Vitamin (MULTIVITAMIN) tablet Take 1 tablet by mouth daily.      Marland Kitchen  niacin 500 MG tablet Take 500 mg by mouth Nightly.       . predniSONE (DELTASONE) 10 MG tablet AS DIRECTED      . saw palmetto 160 MG capsule Take 160 mg by mouth 2 (two) times daily.       Current Facility-Administered Medications  Medication Dose Route Frequency Provider Last Rate Last Dose  . loratadine (CLARITIN) tablet 10 mg  10 mg Oral Daily Jonita Albee, MD        Allergies  Allergen Reactions  . Amoxicillin Nausea And Vomiting  . Darvon     Chest pain    History   Social History  . Marital Status: Married    Spouse Name: N/A    Number of Children: 2  . Years of Education: N/A   Occupational History  . Retired    Social History Main Topics  . Smoking status: Former Smoker -- 2.00 packs/day for 15 years    Types: Cigarettes    Quit date: 01/11/1974  . Smokeless tobacco: Not on file  . Alcohol Use: 0.5 oz/week    1 drink(s) per week     Comment: occ    . Drug Use: No  . Sexually Active: No   Other Topics Concern  . Not on file   Social History Narrative  . No narrative on file    Family History  Problem Relation Age of Onset  . Cirrhosis Father   . Heart failure Mother     in 18s    Review of Systems:  As stated in the HPI and otherwise negative.   BP 121/79  Pulse 88  Ht 6' (1.829 m)  Wt 222 lb (100.699 kg)  BMI 30.1 kg/m2  Physical Examination: General: Well developed, well nourished, NAD HEENT: OP clear, mucus membranes moist SKIN: warm, dry. No rashes. Neuro: No focal deficits Musculoskeletal: Muscle strength 5/5 all ext Psychiatric: Mood and affect normal Neck: No JVD, no carotid bruits, no thyromegaly, no lymphadenopathy. Lungs:Clear bilaterally, no wheezes, rhonci, crackles Cardiovascular: Regular rate and rhythm. No murmurs, gallops or rubs. Abdomen:Soft. Bowel sounds present. Non-tender.  Extremities: No lower extremity edema. Pulses are 2 + in the bilateral DP/PT.  Exercise stress myoview 03/19/13: Rest ECG: NSR - Normal EKG  Stress Procedure: The patient exercised on the treadmill utilizing the Bruce Protocol for 10:15 minutes. The patient stopped due to fatigue and denied any chest pain. Technetium 76m Sestamibi was injected at peak exercise and myocardial perfusion imaging was performed after a brief delay.  Stress ECG: No significant change from baseline ECG  QPS  Raw Data Images: Normal; no motion artifact; normal heart/lung ratio.  Stress Images: Normal homogeneous uptake in all areas of the myocardium.  Rest Images: Normal homogeneous uptake in all areas of the myocardium.  Subtraction (SDS): No evidence of ischemia.  Transient Ischemic Dilatation (Normal <1.22): 0.93  Lung/Heart Ratio (Normal <0.45): 0.27  Quantitative Gated Spect Images  QGS EDV: 82 ml  QGS ESV: 34 ml  Impression  Exercise Capacity: Good exercise capacity.  BP Response: Normal blood pressure response.  Clinical Symptoms: No  chest pain.  ECG Impression: No significant ST segment change suggestive of ischemia.  Comparison with Prior Nuclear Study: No images to compare  Overall Impression: Normal stress nuclear study.  LV Ejection Fraction: 58%. LV Wall Motion: NL LV Function; NL Wall Motion  Echo 03/15/13: Left ventricle: The cavity size was normal. Wall thickness was increased in a pattern of moderate LVH. The estimated  ejection fraction was 60%. Wall motion was normal; there were no regional wall motion abnormalities. Doppler parameters are consistent with abnormal left ventricular relaxation (grade 1 diastolic dysfunction). - Right ventricle: The cavity size was normal. Systolic function was normal.  Assessment and Plan:   1. Chest pain: No evidence of ischemia on stress myoview. No chest pain with exercise. Echo overall normal. No further cardiac workup at this time.

## 2013-06-12 ENCOUNTER — Other Ambulatory Visit: Payer: Self-pay

## 2013-06-14 ENCOUNTER — Other Ambulatory Visit: Payer: Self-pay | Admitting: Physician Assistant

## 2013-07-15 ENCOUNTER — Ambulatory Visit (INDEPENDENT_AMBULATORY_CARE_PROVIDER_SITE_OTHER): Payer: Medicare Other | Admitting: Internal Medicine

## 2013-07-15 ENCOUNTER — Encounter: Payer: Self-pay | Admitting: Internal Medicine

## 2013-07-15 VITALS — BP 128/80 | HR 73 | Temp 97.5°F | Resp 16 | Ht 71.0 in | Wt 226.0 lb

## 2013-07-15 DIAGNOSIS — Z0000 Encounter for general adult medical examination without abnormal findings: Secondary | ICD-10-CM

## 2013-07-15 DIAGNOSIS — E782 Mixed hyperlipidemia: Secondary | ICD-10-CM

## 2013-07-15 DIAGNOSIS — I10 Essential (primary) hypertension: Secondary | ICD-10-CM

## 2013-07-15 DIAGNOSIS — Z87898 Personal history of other specified conditions: Secondary | ICD-10-CM

## 2013-07-15 DIAGNOSIS — Z139 Encounter for screening, unspecified: Secondary | ICD-10-CM

## 2013-07-15 DIAGNOSIS — E785 Hyperlipidemia, unspecified: Secondary | ICD-10-CM

## 2013-07-15 DIAGNOSIS — Z79899 Other long term (current) drug therapy: Secondary | ICD-10-CM

## 2013-07-15 DIAGNOSIS — Z23 Encounter for immunization: Secondary | ICD-10-CM

## 2013-07-15 LAB — CBC WITH DIFFERENTIAL/PLATELET
Basophils Absolute: 0 10*3/uL (ref 0.0–0.1)
Basophils Relative: 0 % (ref 0–1)
Eosinophils Absolute: 0.1 10*3/uL (ref 0.0–0.7)
Eosinophils Relative: 3 % (ref 0–5)
Hemoglobin: 15.4 g/dL (ref 13.0–17.0)
MCHC: 35.5 g/dL (ref 30.0–36.0)
MCV: 82 fL (ref 78.0–100.0)
Monocytes Absolute: 0.6 10*3/uL (ref 0.1–1.0)
Platelets: 253 10*3/uL (ref 150–400)
RBC: 5.29 MIL/uL (ref 4.22–5.81)
RDW: 14.3 % (ref 11.5–15.5)
WBC: 4.5 10*3/uL (ref 4.0–10.5)

## 2013-07-15 LAB — POCT URINALYSIS DIPSTICK
Bilirubin, UA: NEGATIVE
Leukocytes, UA: NEGATIVE
Nitrite, UA: NEGATIVE
pH, UA: 5.5

## 2013-07-15 LAB — IFOBT (OCCULT BLOOD): IFOBT: NEGATIVE

## 2013-07-15 LAB — POCT UA - MICROSCOPIC ONLY
Bacteria, U Microscopic: NEGATIVE
Casts, Ur, LPF, POC: NEGATIVE
Yeast, UA: NEGATIVE

## 2013-07-15 MED ORDER — ATORVASTATIN CALCIUM 20 MG PO TABS: 20.0000 mg | ORAL_TABLET | Freq: Every day | ORAL | Status: DC

## 2013-07-15 NOTE — Progress Notes (Signed)
  Subjective:    Patient ID: Todd [name], male    DOB: 13-May-1941, 72 y.o.   MRN: [id number]  HPI Doing well. Angioedema recurs occ, not taking daily antihistamines and needs to. HTN not an issue Home BPs all normal on no meds.   Review of Systems  Constitutional: Negative.   HENT: Positive for facial swelling.   Eyes: Negative.   Respiratory: Negative.   Cardiovascular: Negative.   Gastrointestinal: Negative.   Endocrine: Negative.   Musculoskeletal: Negative.   Skin: Negative.   Allergic/Immunologic: Positive for environmental allergies.  Neurological: Negative.   Hematological: Negative.   Psychiatric/Behavioral: Negative.        Objective:   Physical Exam  Vitals reviewed. Constitutional: He is oriented to person, place, and time. He appears well-developed and well-nourished. No distress.  HENT:  Head: Normocephalic.  Right Ear: External ear normal.  Left Ear: External ear normal.  Nose: Nose normal.  Mouth/Throat: Oropharynx is clear and moist.  Eyes: Conjunctivae and EOM are normal. Pupils are equal, round, and reactive to light. Right eye exhibits no discharge. Left eye exhibits no discharge. No scleral icterus.  Neck: Normal range of motion. Neck supple. No tracheal deviation present. No thyromegaly present.  Cardiovascular: Normal rate, regular rhythm, normal heart sounds and intact distal pulses.   Pulmonary/Chest: Breath sounds normal.  Abdominal: Soft. Bowel sounds are normal.  Genitourinary: Rectum normal, prostate normal and penis normal.  Musculoskeletal: Normal range of motion.  Lymphadenopathy:    He has no cervical adenopathy.  Neurological: He is alert and oriented to person, place, and time. He has normal reflexes. No cranial nerve deficit. He exhibits normal muscle tone. Coordination normal.  Skin: No rash noted.  Psychiatric: He has a normal mood and affect. His behavior is normal. Judgment and thought content normal.     Results for orders  placed in visit on 07/15/13  POCT URINALYSIS DIPSTICK      Result Value Range   Color, UA yellow     Clarity, UA clear     Glucose, UA neg     Bilirubin, UA neg     Ketones, UA neg     Spec Grav, UA 1.025     Blood, UA trace     pH, UA 5.5     Protein, UA neg     Urobilinogen, UA 0.2     Nitrite, UA neg     Leukocytes, UA Negative    POCT UA - MICROSCOPIC ONLY      Result Value Range   WBC, Ur, HPF, POC 0-1     RBC, urine, microscopic 0-4     Bacteria, U Microscopic neg     Mucus, UA trace     Epithelial cells, urine per micros 0-3     Crystals, Ur, HPF, POC neg     Casts, Ur, LPF, POC neg     Yeast, UA neg          Assessment & Plan:  Healthy/Flu shot RF meds 1 yr

## 2013-07-15 NOTE — Patient Instructions (Signed)
Immunization Schedule, Adult  Influenza vaccine.  Adults should be given 1 dose every year.  Tetanus, diphtheria, and pertussis (Td, Tdap) vaccine.  Adults who have not previously been given Tdap or who do not know their vaccine status should be given 1 dose of Tdap.  Adults should have a Td booster every 10 years.  Doses should be given if needed to catch up on missed doses in the past.  Pregnant women should be given 1 dose of Tdap vaccine during each pregnancy.  Varicella vaccine.  All adults without evidence of immunity to varicella should receive 2 doses or a second dose if they have received only 1 dose.  Pregnant women who do not have evidence of immunity should be given the first dose after their pregnancy.  Human papillomavirus (HPV) vaccine.  Women aged 13 through 26 years who have not been given the vaccine previously should be given the 3 dose series. The second dose should be given 1 to 2 months after the first dose. The third dose should be given at least 24 weeks after the first dose.  The vaccine is not recommended for use in pregnant women. However, pregnancy testing is not needed before being given a dose. If a woman is found to be pregnant after being given a dose, no treatment is needed. In that case, the remaining doses should be delayed until after the pregnancy.  Men aged 13 through 21 years who have not been given the vaccine previously should be given the 3 dose series. Men aged 22 through 26 years may be given the 3 dose series. The second dose should be given 1 to 2 months after the first dose. The third dose should be given at least 24 weeks after the first dose.  Zoster vaccine.  One dose is recommended for adults aged 60 years and older unless certain conditions are present.  Measles, mumps, and rubella (MMR) vaccine.  Adults born before 1957 generally are considered immune to measles and mumps. Healthcare workers born before 1957 who do not have  evidence of immunity should consider vaccination.  Adults born in 1957 or later should have 1 or more doses of MMR vaccine unless there is a contraindication for the vaccine or they have evidence of immunity to the diseases. A second dose should be given at least 28 days after the first dose. Adults receiving certain types of previous vaccines should consider or be given vaccine doses.  For women of childbearing age, rubella immunity should be determined. If there is no evidence of immunity, women who are not pregnant should be vaccinated. If there is no evidence of immunity, women who are pregnant should delay vaccination until after their pregnancy.  Pneumococcal polysaccharide (PPSV23) vaccine.  All adults aged 65 years and older should be given 1 dose.  Adults younger than age 65 years who have certain medical conditions, who smoke cigarettes, who reside in nursing homes or long-term care facilities, or who have an unknown vaccination history should usually be given 1 or 2 doses of the vaccine.  Pneumococcal 13-valent conjugate (PVC13) vaccine.  Adults aged 19 years or older with certain medical conditions and an unknown or incomplete pneumococcal vaccination history should usually be given 1 dose of the vaccine. This dose may be in addition to a PPSV23 vaccine dose.  Meningococcal vaccine.  First-year college students up to age 21 years who are living in residence halls should be given a dose if they did not receive a dose on   or after their 16th birthday.  A dose should be given to microbiologists working with certain meningitis bacteria, military recruits, and people who travel to or live in countries with a high rate of meningitis.  One or 2 doses should be given to adults who have certain high-risk conditions.  Hepatitis A vaccine.  Adults who wish to be protected from this disease, who have certain high-risk conditions, who work with hepatitis A-infected animals, who work in  hepatitis A research labs, or who travel to or work in countries with a high rate of hepatitis A should be given the 2 dose series of the vaccine.  Adults who were previously unvaccinated and who anticipate close contact with an international adoptee during the first 60 days after arrival in the [place] from a country with a high rate of hepatitis A should be given the vaccine. The first dose of the 2 dose series should be given 2 or more weeks before the arrival of the adoptee.  Hepatitis B vaccine.  Adults who wish to be protected from this disease, who have certain high-risk conditions, who may be exposed to blood or other infectious body fluids, who are household contacts or sex partners of hepatitis B positive people, who are clients or workers in certain care facilities, or who travel to or work in countries with a high rate of hepatitis B should be given the 3 dose series of the vaccine. If you travel outside the [place], additional vaccines may be needed. The Centers for Disease Control and Prevention (CDC) provides information about the vaccines, medicines, and other measures necessary to prevent illness and injury during international travel. Visit the CDC website at www.cdc.gov/travel or call (800) CDC-INFO [800-232-4636]. You may also consult a travel clinic or your caregiver. Document Released: 01/14/2004 Document Revised: 01/16/2012 Document Reviewed: 12/09/2011 [employer] Patient Information 2014 [employer].  

## 2013-09-21 ENCOUNTER — Other Ambulatory Visit: Payer: Self-pay | Admitting: Internal Medicine

## 2013-10-20 DIAGNOSIS — K449 Diaphragmatic hernia without obstruction or gangrene: Secondary | ICD-10-CM

## 2013-10-20 HISTORY — DX: Diaphragmatic hernia without obstruction or gangrene: K44.9

## 2013-10-20 HISTORY — PX: ESOPHAGOGASTRODUODENOSCOPY: SHX1529

## 2013-10-20 HISTORY — PX: COLONOSCOPY: SHX174

## 2013-10-21 ENCOUNTER — Other Ambulatory Visit: Payer: Self-pay | Admitting: Gastroenterology

## 2014-02-10 ENCOUNTER — Ambulatory Visit (INDEPENDENT_AMBULATORY_CARE_PROVIDER_SITE_OTHER): Payer: Medicare Other | Admitting: Internal Medicine

## 2014-02-10 VITALS — BP 120/80 | HR 93 | Temp 98.0°F | Resp 17 | Ht 71.0 in | Wt 223.0 lb

## 2014-02-10 DIAGNOSIS — F439 Reaction to severe stress, unspecified: Secondary | ICD-10-CM

## 2014-02-10 DIAGNOSIS — I10 Essential (primary) hypertension: Secondary | ICD-10-CM

## 2014-02-10 DIAGNOSIS — Z733 Stress, not elsewhere classified: Secondary | ICD-10-CM

## 2014-02-10 MED ORDER — METOPROLOL SUCCINATE ER 25 MG PO TB24: 25.0000 mg | ORAL_TABLET | Freq: Every day | ORAL | Status: DC

## 2014-02-10 NOTE — Progress Notes (Signed)
   Subjective:    Patient ID: Todd [name], male    DOB: 04-Apr-1941, 73 y.o.   MRN: [id number]  HPI 73 year old male here for HTN. It has been running high for the past 2 weeks. He also complains of nasal congestion and headaches. He states his left ear feels clogged. He has been having sinus issues for the past 3 days. He taken a couple of goody powders and holds his head in the steam to relieve the headaches. Had full cardiac w/up in er all normal, thought to be stress.   Review of Systems     Objective:   Physical Exam  Constitutional: He is oriented to person, place, and time. He appears well-developed and well-nourished. No distress.  HENT:  Head: Normocephalic.  Right Ear: External ear normal.  Left Ear: External ear normal.  Nose: Nose normal.  Mouth/Throat: Oropharynx is clear and moist.  Eyes: EOM are normal. Pupils are equal, round, and reactive to light.  Neck: Normal range of motion. Neck supple.  Cardiovascular: Normal rate, regular rhythm, normal heart sounds and intact distal pulses.   No murmur heard. Pulmonary/Chest: Effort normal and breath sounds normal.  Abdominal: Soft.  Musculoskeletal: Normal range of motion.  Neurological: He is alert and oriented to person, place, and time. No cranial nerve deficit. He exhibits normal muscle tone. Coordination normal.  Skin: No rash noted.  Psychiatric: He has a normal mood and affect. His behavior is normal.          Assessment & Plan:  BP elevation at home often Trial toprol xl 25mg  qd RTC 1 month

## 2014-02-10 NOTE — Patient Instructions (Signed)

## 2014-04-18 ENCOUNTER — Ambulatory Visit (INDEPENDENT_AMBULATORY_CARE_PROVIDER_SITE_OTHER): Payer: Medicare Other

## 2014-04-18 ENCOUNTER — Ambulatory Visit (INDEPENDENT_AMBULATORY_CARE_PROVIDER_SITE_OTHER): Payer: Medicare Other | Admitting: Internal Medicine

## 2014-04-18 VITALS — BP 136/82 | HR 85 | Temp 97.8°F | Resp 16 | Ht 71.0 in | Wt 221.2 lb

## 2014-04-18 DIAGNOSIS — M25562 Pain in left knee: Secondary | ICD-10-CM

## 2014-04-18 DIAGNOSIS — M6580 Other synovitis and tenosynovitis, unspecified site: Secondary | ICD-10-CM

## 2014-04-18 DIAGNOSIS — M25569 Pain in unspecified knee: Secondary | ICD-10-CM

## 2014-04-18 DIAGNOSIS — M76899 Other specified enthesopathies of unspecified lower limb, excluding foot: Secondary | ICD-10-CM

## 2014-04-18 DIAGNOSIS — S8392XA Sprain of unspecified site of left knee, initial encounter: Secondary | ICD-10-CM

## 2014-04-18 DIAGNOSIS — IMO0002 Reserved for concepts with insufficient information to code with codable children: Secondary | ICD-10-CM

## 2014-04-18 MED ORDER — PREDNISONE 10 MG PO TABS: ORAL_TABLET | ORAL | Status: DC

## 2014-04-18 MED ORDER — METHYLPREDNISOLONE ACETATE 80 MG/ML IJ SUSP
120.0000 mg | Freq: Once | INTRAMUSCULAR | Status: AC
  Administered 2014-04-18: 120 mg via INTRAMUSCULAR

## 2014-04-18 MED ORDER — CLOBETASOL PROPIONATE 0.05 % EX OINT: 1.0000 "application " | TOPICAL_OINTMENT | Freq: Two times a day (BID) | CUTANEOUS | Status: DC

## 2014-04-18 NOTE — Progress Notes (Signed)
   Subjective:    Patient ID: Todd [name], male    DOB: 1941-03-25, 73 y.o.   MRN: [id number]  HPI    Review of Systems     Objective:   Physical Exam        Assessment & Plan:

## 2014-04-18 NOTE — Progress Notes (Signed)
Subjective:    Patient ID: Todd [name], male    DOB: July 05, 1941, 73 y.o.   MRN: [id number]  HPI 73 year old male complains of left knee pain. He twisted his knee yesterday and it is still swollen and painful. It hurt more when trying to lift his left leg up. No swelling of joint. Pain medial knee and over infa patella tendon.. No erythema, no hx of gout, no hx for infection. He was fly fishin [place] w[place] in [place] and twisted knee as cause of pain.   Review of Systems     Objective:   Physical Exam  Constitutional: He is oriented to person, place, and time. He appears well-developed and well-nourished. No distress.  HENT:  Head: Normocephalic.  Eyes: EOM are normal.  Neck: Normal range of motion.  Pulmonary/Chest: Effort normal.  Musculoskeletal:       Left knee: He exhibits swelling and abnormal alignment. He exhibits normal range of motion, no effusion, no ecchymosis, no deformity, no laceration, no erythema, no LCL laxity, normal patellar mobility and no MCL laxity. Tenderness found. Medial joint line and patellar tendon tenderness noted. No lateral joint line, no MCL and no LCL tenderness noted.  Neurological: He is alert and oriented to person, place, and time. No cranial nerve deficit or sensory deficit. He exhibits normal muscle tone. Coordination and gait normal.  Skin: No rash noted.  Psychiatric: He has a normal mood and affect. His behavior is normal. Judgment and thought content normal.      [facility] reading (PRIMARY) by  Dr.Maximos Zayas DJD knee, no fx seen      Assessment & Plan:  Sprain/Tendonitis knee/Arthritis knee Clobetasol TID apply tendonitis/Depomedrol Tylenol for pain Prednisone 6d taper

## 2014-04-18 NOTE — Patient Instructions (Signed)
Meniscus Tear with Phase I Rehab The meniscus is a C-shaped cartilage structure, located in the knee joint between the thigh bone (femur) and the shinbone (tibia). Two menisci are located in each knee joint: the inner and outer meniscus. The meniscus acts as an adapter between the thigh bone and shinbone, allowing them to fit properly together. It also functions as a shock absorber, to reduce the stress placed on the knee joint and to help supply nutrients to the knee joint cartilage. As people age, the meniscus begins to harden and become more vulnerable to injury. Meniscus tears are a common injury, especially in older athletes. Inner meniscus tears are more common than outer meniscus tears.  SYMPTOMS   Pain in the knee, especially with standing or squatting with the affected leg.  Tenderness along the joint line.  Swelling in the knee joint (effusion), usually starting 1 to 2 days after injury.  Locking or catching of the knee joint, causing inability to straighten the knee completely.  Giving way or buckling of the knee. CAUSES  A meniscus tear occurs when a force is placed on the meniscus that is greater than it can handle. Common causes of injury include:  Direct hit (trauma) to the knee.  Twisting, pivoting, or cutting (rapidly changing direction while running), kneeling or squatting.  Without injury, due to aging. RISK INCREASES WITH:  Contact sports (football, rugby).  Sports in which cleats are used with pivoting (soccer, lacrosse) or sports in which good shoe grip and sudden change in direction are required (racquetball, basketball, squash).  Previous knee injury.  Associated knee injury, particularly ligament injuries.  Poor strength and flexibility. PREVENTION  Warm up and stretch properly before activity.  Maintain physical fitness:  Strength, flexibility, and endurance.  Cardiovascular fitness.  Protect the knee with a brace or elastic bandage.  Wear  properly fitted protective equipment (proper cleats for the surface). PROGNOSIS  Sometimes, meniscus tears heal on their own. However, definitive treatment requires surgery, followed by at least 6 weeks of recovery.  RELATED COMPLICATIONS   Recurring symptoms that result in a chronic problem.  Repeated knee injury, especially if sports are resumed too soon after injury or surgery.  Progression of the tear (the tear gets larger), if untreated.  Arthritis of the knee in later years (with or without surgery).  Complications of surgery, including infection, bleeding, injury to nerves (numbness, weakness, paralysis) continued pain, giving way, locking, nonhealing of meniscus (if repaired), need for further surgery, and knee stiffness (loss of motion). TREATMENT  Treatment first involves the use of ice and medicine, to reduce pain and inflammation. You may find using crutches to walk more comfortable. However, it is okay to bear weight on the injured knee, if the pain will allow it. Surgery is often advised as a definitive treatment. Surgery is performed through an incision near the joint (arthroscopically). The torn piece of the meniscus is removed, and if possible the joint cartilage is repaired. After surgery, the joint must be restrained. After restraint, it is important to perform strengthening and stretching exercises to help regain strength and a full range of motion. These exercises may be completed at home or with a therapist.  MEDICATION  If pain medicine is needed, nonsteroidal anti-inflammatory medicines (aspirin and ibuprofen), or other minor pain relievers (acetaminophen), are often advised.  Do not take pain medicine for 7 days before surgery.  Prescription pain relievers may be given, if your caregiver thinks they are needed. Use only as directed and   only as much as you need. HEAT AND COLD  Cold treatment (icing) should be applied for 10 to 15 minutes every 2 to 3 hours for  inflammation and pain, and immediately after activity that aggravates your symptoms. Use ice packs or an ice massage.  Heat treatment may be used before performing stretching and strengthening activities prescribed by your caregiver, physical therapist, or athletic trainer. Use a heat pack or a warm water soak. SEEK MEDICAL CARE IF:   Symptoms get worse or do not improve in 2 weeks, despite treatment.  New, unexplained symptoms develop. (Drugs used in treatment may produce side effects.) EXERCISES RANGE OF MOTION (ROM) AND STRETCHING EXERCISES - Meniscus Tear, Non-operative, Phase I These are some of the initial exercises with which you may start your rehabilitation program, until you see your caregiver again or until your symptoms are resolved. Remember:   These initial exercises are intended to be gentle. They will help you restore motion without increasing any swelling.  Completing these exercises allows less painful movement and prepares you for the more aggressive strengthening exercises in Phase II.  An effective stretch should be held for at least 30 seconds.  A stretch should never be painful. You should only feel a gentle lengthening or release in the stretched tissue. RANGE OF MOTION - Knee Flexion, Active  Lie on your back with both knees straight. (If this causes back discomfort, bend your healthy knee, placing your foot flat on the floor.)  Slowly slide your heel back toward your buttocks until you feel a gentle stretch in the front of your knee or thigh.  Hold for __________ seconds. Slowly slide your heel back to the starting position. Repeat __________ times. Complete this exercise __________ times per day.  RANGE OF MOTION - Knee Flexion and Extension, Active-Assisted  Sit on the edge of a table or chair with your thighs firmly supported. It may be helpful to place a folded towel under the end of your right / left thigh.  Flexion (bending): Place the ankle of your  healthy leg on top of the other ankle. Use your healthy leg to gently bend your right / left knee until you feel a mild tension across the top of your knee.  Hold for __________ seconds.  Extension (straightening): Switch your ankles so your right / left leg is on top. Use your healthy leg to straighten your right / left knee until you feel a mild tension on the backside of your knee.  Hold for __________ seconds. Repeat __________ times. Complete __________ times per day. STRETCH - Knee Flexion, Supine  Lie on the floor with your right / left heel and foot lightly touching the wall. (Place both feet on the wall if you do not use a door frame.)  Without using any effort, allow gravity to slide your foot down the wall slowly until you feel a gentle stretch in the front of your right / left knee.  Hold this stretch for __________ seconds. Then return the leg to the starting position, using your healthy leg for help, if needed. Repeat __________ times. Complete this stretch __________ times per day.  STRETCH - Knee Extension Sitting  Sit with your right / left leg/heel propped on another chair, coffee table, or foot stool.  Allow your leg muscles to relax, letting gravity straighten out your knee.*  You should feel a stretch behind your right / left knee. Hold this position for __________ seconds. Repeat __________ times. Complete this stretch __________   times per day.  *Your physician, physical therapist or athletic trainer may instruct you place a __________ weight on your thigh, just above your kneecap, to deepen the stretch.  STRENGTHENING EXERCISES - Meniscus Tear, Non-operative, Phase I These exercises may help you when beginning to rehabilitate your injury. They may resolve your symptoms with or without further involvement from your physician, physical therapist or athletic trainer. While completing these exercises, remember:   Muscles can gain both the endurance and the strength  needed for everyday activities through controlled exercises.  Complete these exercises as instructed by your physician, physical therapist or athletic trainer. Progress the resistance and repetitions only as guided. STRENGTH - Quadriceps, Isometrics  Lie on your back with your right / left leg extended and your opposite knee bent.  Gradually tense the muscles in the front of your right / left thigh. You should see either your knee cap slide up toward your hip or increased dimpling just above the knee. This motion will push the back of the knee down toward the floor, mat, or bed on which you are lying.  Hold the muscle as tight as you can, without increasing your pain, for __________ seconds.  Relax the muscles slowly and completely between each repetition. Repeat __________ times. Complete this exercise __________ times per day.  STRENGTH - Quadriceps, Short Arcs   Lie on your back. Place a __________ inch towel roll under your right / left knee, so that the knee bends slightly.  Raise only your lower leg by tightening the muscles in the front of your thigh. Do not allow your thigh to rise.  Hold this position for __________ seconds. Repeat __________ times. Complete this exercise __________ times per day.  OPTIONAL ANKLE WEIGHTS: Begin with ____________________, but DO NOT exceed ____________________. Increase in 1 pound/0.5 kilogram increments. STRENGTH - Quadriceps, Straight Leg Raises  Quality counts! Watch for signs that the quadriceps muscle is working, to be sure you are strengthening the correct muscles and not "cheating" by substituting with healthier muscles.  Lay on your back with your right / left leg extended and your opposite knee bent.  Tense the muscles in the front of your right / left thigh. You should see either your knee cap slide up or increased dimpling just above the knee. Your thigh may even shake a bit.  Tighten these muscles even more and raise your leg 4 to 6  inches off the floor. Hold for __________ seconds.  Keeping these muscles tense, lower your leg.  Relax the muscles slowly and completely in between each repetition. Repeat __________ times. Complete this exercise __________ times per day.  STRENGTH - Hamstring, Curls   Lay on your stomach with your legs extended. (If you lay on a bed, your feet may hang over the edge.)  Tighten the muscles in the back of your thigh to bend your right / left knee up to 90 degrees. Keep your hips flat on the bed.  Hold this position for __________ seconds.  Slowly lower your leg back to the starting position. Repeat __________ times. Complete this exercise __________ times per day.  STRENGTH  Quadriceps, Squats  Stand in a door frame so that your feet and knees are in line with the frame.  Use your hands for balance, not support, on the frame.  Slowly lower your weight, bending at the hips and knees. Keep your lower legs upright so that they are parallel with the door frame. Squat only within the range that does   not increase your knee pain. Never let your hips drop below your knees.  Slowly return upright, pushing with your legs, not pulling with your hands. Repeat __________ times. Complete this exercise __________ times per day.  STRENGTH - Quad/VMO, Isometric   Sit in a chair with your right / left knee slightly bent. With your fingertips, feel the VMO muscle just above the inside of your knee. The VMO is important in controlling the position of your kneecap.  Keeping your fingertips on this muscle. Without actually moving your leg, attempt to drive your knee down as if straightening your leg. You should feel your VMO tense. If you have a difficult time, you may wish to try the same exercise on your healthy knee first.  Tense this muscle as hard as you can without increasing any knee pain.  Hold for __________ seconds. Relax the muscles slowly and completely in between each repetition. Repeat  __________ times. Complete exercise __________ times per day.  Document Released: 11/07/1998 Document Revised: 01/16/2012 Document Reviewed: 02/05/2009 [facility] Patient Information 2014 [place], [place]. Arthritis, Nonspecific Arthritis is inflammation of a joint. This usually means pain, redness, warmth or swelling are present. One or more joints may be involved. There are a number of types of arthritis. Your caregiver may not be able to tell what type of arthritis you have right away. CAUSES  The most common cause of arthritis is the wear and tear on the joint (osteoarthritis). This causes damage to the cartilage, which can break down over time. The knees, hips, back and neck are most often affected by this type of arthritis. Other types of arthritis and common causes of joint pain include:  Sprains and other injuries near the joint. Sometimes minor sprains and injuries cause pain and swelling that develop hours later.  Rheumatoid arthritis. This affects hands, feet and knees. It usually affects both sides of your body at the same time. It is often associated with chronic ailments, fever, weight loss and general weakness.  Crystal arthritis. Gout and pseudo gout can cause occasional acute severe pain, redness and swelling in the foot, ankle, or knee.  Infectious arthritis. Bacteria can get into a joint through a break in overlying skin. This can cause infection of the joint. Bacteria and viruses can also spread through the blood and affect your joints.  Drug, infectious and allergy reactions. Sometimes joints can become mildly painful and slightly swollen with these types of illnesses. SYMPTOMS   Pain is the main symptom.  Your joint or joints can also be red, swollen and warm or hot to the touch.  You may have a fever with certain types of arthritis, or even feel overall ill.  The joint with arthritis will hurt with movement. Stiffness is present with some types of arthritis. DIAGNOSIS    Your caregiver will suspect arthritis based on your description of your symptoms and on your exam. Testing may be needed to find the type of arthritis:  Blood and sometimes urine tests.  X-ray tests and sometimes CT or MRI scans.  Removal of fluid from the joint (arthrocentesis) is done to check for bacteria, crystals or other causes. Your caregiver (or a specialist) will numb the area over the joint with a local anesthetic, and use a needle to remove joint fluid for examination. This procedure is only minimally uncomfortable.  Even with these tests, your caregiver may not be able to tell what kind of arthritis you have. Consultation with a specialist (rheumatologist) may be helpful. TREATMENT  Your caregiver will discuss with you treatment specific to your type of arthritis. If the specific type cannot be determined, then the following general recommendations may apply. Treatment of severe joint pain includes:  Rest.  Elevation.  Anti-inflammatory medication (for example, ibuprofen) may be prescribed. Avoiding activities that cause increased pain.  Only take over-the-counter or prescription medicines for pain and discomfort as recommended by your caregiver.  Cold packs over an inflamed joint may be used for 10 to 15 minutes every hour. Hot packs sometimes feel better, but do not use overnight. Do not use hot packs if you are diabetic without your caregiver's permission.  A cortisone shot into arthritic joints may help reduce pain and swelling.  Any acute arthritis that gets worse over the next 1 to 2 days needs to be looked at to be sure there is no joint infection. Long-term arthritis treatment involves modifying activities and lifestyle to reduce joint stress jarring. This can include weight loss. Also, exercise is needed to nourish the joint cartilage and remove waste. This helps keep the muscles around the joint strong. HOME CARE INSTRUCTIONS   Do not take aspirin to relieve pain  if gout is suspected. This elevates uric acid levels.  Only take over-the-counter or prescription medicines for pain, discomfort or fever as directed by your caregiver.  Rest the joint as much as possible.  If your joint is swollen, keep it elevated.  Use crutches if the painful joint is in your leg.  Drinking plenty of fluids may help for certain types of arthritis.  Follow your caregiver's dietary instructions.  Try low-impact exercise such as:  Swimming.  Water aerobics.  Biking.  Walking.  Morning stiffness is often relieved by a warm shower.  Put your joints through regular range-of-motion. SEEK MEDICAL CARE IF:   You do not feel better in 24 hours or are getting worse.  You have side effects to medications, or are not getting better with treatment. SEEK IMMEDIATE MEDICAL CARE IF:   You have a fever.  You develop severe joint pain, swelling or redness.  Many joints are involved and become painful and swollen.  There is severe back pain and/or leg weakness.  You have loss of bowel or bladder control. Document Released: 12/01/2004 Document Revised: 01/16/2012 Document Reviewed: 12/17/2008 [facility] Patient Information 2014 [place].

## 2014-07-21 ENCOUNTER — Ambulatory Visit (INDEPENDENT_AMBULATORY_CARE_PROVIDER_SITE_OTHER): Payer: Medicare Other | Admitting: Emergency Medicine

## 2014-07-21 VITALS — BP 144/90 | HR 80 | Temp 97.8°F | Resp 20 | Ht 69.0 in | Wt 222.6 lb

## 2014-07-21 DIAGNOSIS — M79609 Pain in unspecified limb: Secondary | ICD-10-CM

## 2014-07-21 DIAGNOSIS — Z23 Encounter for immunization: Secondary | ICD-10-CM

## 2014-07-21 DIAGNOSIS — I10 Essential (primary) hypertension: Secondary | ICD-10-CM

## 2014-07-21 DIAGNOSIS — M79606 Pain in leg, unspecified: Secondary | ICD-10-CM

## 2014-07-21 LAB — COMPREHENSIVE METABOLIC PANEL
ALK PHOS: 66 U/L (ref 39–117)
ALT: 22 U/L (ref 0–53)
AST: 23 U/L (ref 0–37)
Albumin: 4.5 g/dL (ref 3.5–5.2)
BUN: 14 mg/dL (ref 6–23)
CO2: 27 mEq/L (ref 19–32)
Calcium: 9.5 mg/dL (ref 8.4–10.5)
Chloride: 99 mEq/L (ref 96–112)
Creat: 0.98 mg/dL (ref 0.50–1.35)
GLUCOSE: 90 mg/dL (ref 70–99)
POTASSIUM: 4.1 meq/L (ref 3.5–5.3)
Sodium: 136 mEq/L (ref 135–145)
TOTAL PROTEIN: 7 g/dL (ref 6.0–8.3)
Total Bilirubin: 0.8 mg/dL (ref 0.2–1.2)

## 2014-07-21 LAB — POCT CBC
GRANULOCYTE PERCENT: 63.8 % (ref 37–80)
HCT, POC: 46.9 % (ref 43.5–53.7)
Hemoglobin: 15.9 g/dL (ref 14.1–18.1)
Lymph, poc: 1.3 (ref 0.6–3.4)
MCH, POC: 30 pg (ref 27–31.2)
MCHC: 33.9 g/dL (ref 31.8–35.4)
MCV: 88.7 fL (ref 80–97)
MID (CBC): 0.5 (ref 0–0.9)
MPV: 8.1 fL (ref 0–99.8)
PLATELET COUNT, POC: 214 10*3/uL (ref 142–424)
POC GRANULOCYTE: 3.2 (ref 2–6.9)
POC LYMPH %: 26.2 % (ref 10–50)
POC MID %: 10 %M (ref 0–12)
RBC: 5.28 M/uL (ref 4.69–6.13)
RDW, POC: 13.9 %
WBC: 5 10*3/uL (ref 4.6–10.2)

## 2014-07-21 LAB — MAGNESIUM: Magnesium: 2.1 mg/dL (ref 1.5–2.5)

## 2014-07-21 LAB — CK: Total CK: 166 U/L (ref 7–232)

## 2014-07-21 NOTE — Patient Instructions (Signed)

## 2014-07-21 NOTE — Progress Notes (Signed)
   Subjective:    Patient ID: Todd [name], male    DOB: February 07, 1941, 73 y.o.   MRN: [id number]  HPI patient enters with a chief complaint of weakness in his legs. This is not a true pain or cramps. He is currently on metoprolol tartrate he states when he gets up especially in the middle of the night he feels weak and dizzy and feels as if he may fall. He denies any chest pain or shortness of breath. During the day when he works out he is able to lift weights without any difficulty. He is also on niacin at night.    Review of Systems     Objective:   Physical Exam patient is alert and cooperative he is not in any distress. Chest was clear heart regular rate no murmurs rubs or gallops appreciated abdomen soft and no masses deep tendon reflexes of the lower extremities are trace motor strength 5 out of 5 EKG normal sinus rhythm no acute changes. No orders of the defined types were placed in this encounter.        Assessment & Plan:  Will hold BP meds at the present time. Re-evaluate at his PE.

## 2014-08-11 ENCOUNTER — Ambulatory Visit (INDEPENDENT_AMBULATORY_CARE_PROVIDER_SITE_OTHER): Payer: Medicare Other | Admitting: Family Medicine

## 2014-08-11 VITALS — BP 127/85 | HR 85 | Temp 97.6°F | Resp 16 | Ht 71.5 in | Wt 216.0 lb

## 2014-08-11 DIAGNOSIS — Z0000 Encounter for general adult medical examination without abnormal findings: Secondary | ICD-10-CM

## 2014-08-11 DIAGNOSIS — E7889 Other lipoprotein metabolism disorders: Secondary | ICD-10-CM

## 2014-08-11 DIAGNOSIS — Z23 Encounter for immunization: Secondary | ICD-10-CM

## 2014-08-11 DIAGNOSIS — I10 Essential (primary) hypertension: Secondary | ICD-10-CM

## 2014-08-11 DIAGNOSIS — Z125 Encounter for screening for malignant neoplasm of prostate: Secondary | ICD-10-CM

## 2014-08-11 LAB — CBC WITH DIFFERENTIAL/PLATELET
Basophils Absolute: 0 10*3/uL (ref 0.0–0.1)
Basophils Relative: 0 % (ref 0–1)
Eosinophils Absolute: 0.1 10*3/uL (ref 0.0–0.7)
Eosinophils Relative: 2 % (ref 0–5)
HCT: 46.5 % (ref 39.0–52.0)
Hemoglobin: 16.6 g/dL (ref 13.0–17.0)
Lymphocytes Relative: 27 % (ref 12–46)
Lymphs Abs: 1.4 10*3/uL (ref 0.7–4.0)
MCH: 30.5 pg (ref 26.0–34.0)
MCHC: 35.7 g/dL (ref 30.0–36.0)
MCV: 85.3 fL (ref 78.0–100.0)
Monocytes Absolute: 0.6 10*3/uL (ref 0.1–1.0)
Monocytes Relative: 12 % (ref 3–12)
Neutro Abs: 3 10*3/uL (ref 1.7–7.7)
Neutrophils Relative %: 59 % (ref 43–77)
Platelets: 268 10*3/uL (ref 150–400)
RBC: 5.45 MIL/uL (ref 4.22–5.81)
RDW: 13.3 % (ref 11.5–15.5)
WBC: 5 10*3/uL (ref 4.0–10.5)

## 2014-08-11 LAB — COMPREHENSIVE METABOLIC PANEL
ALT: 24 U/L (ref 0–53)
AST: 26 U/L (ref 0–37)
Albumin: 5 g/dL (ref 3.5–5.2)
Alkaline Phosphatase: 71 U/L (ref 39–117)
BUN: 13 mg/dL (ref 6–23)
CHLORIDE: 100 meq/L (ref 96–112)
CO2: 29 mEq/L (ref 19–32)
CREATININE: 1.11 mg/dL (ref 0.50–1.35)
Calcium: 9.8 mg/dL (ref 8.4–10.5)
GLUCOSE: 97 mg/dL (ref 70–99)
Potassium: 4.3 mEq/L (ref 3.5–5.3)
Sodium: 137 mEq/L (ref 135–145)
Total Bilirubin: 1 mg/dL (ref 0.2–1.2)
Total Protein: 7.7 g/dL (ref 6.0–8.3)

## 2014-08-11 LAB — POCT URINALYSIS DIPSTICK
Bilirubin, UA: NEGATIVE
Blood, UA: NEGATIVE
Glucose, UA: NEGATIVE
Ketones, UA: NEGATIVE
Leukocytes, UA: NEGATIVE
Nitrite, UA: NEGATIVE
Protein, UA: NEGATIVE
Spec Grav, UA: 1.015
Urobilinogen, UA: 0.2
pH, UA: 7.5

## 2014-08-11 LAB — LIPID PANEL
Cholesterol: 226 mg/dL — ABNORMAL HIGH (ref 0–200)
HDL: 39 mg/dL — ABNORMAL LOW (ref >39)
LDL Cholesterol: 146 mg/dL — ABNORMAL HIGH (ref 0–99)
Total CHOL/HDL Ratio: 5.8 Ratio
Triglycerides: 203 mg/dL — ABNORMAL HIGH (ref <150)
VLDL: 41 mg/dL — ABNORMAL HIGH (ref 0–40)

## 2014-08-11 NOTE — Patient Instructions (Signed)

## 2014-08-11 NOTE — Progress Notes (Addendum)
Subjective:    Patient ID: Todd [name], male    DOB: 1941-10-18, 73 y.o.   MRN: [id number] This chart was scribed for Reginia Forts, MD by Zola Button, Medical Scribe. This patient was seen in Room 22 and the patient's care was started at 9:22 AM.   08/11/2014  Annual Exam, Hypertension and Hyperlipidemia   HPI HPI Comments: Todd [name] is a 73 y.o. male who presents to the [facility] for an annual wellness exam. His last physical exam was 1 year ago and his last colonoscopy was done on 10/2013 by Dr. Watt Climes with no abnormal results. 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   He has been taking his blood pressure at home.  He was advised to stop Metoprolol by Dr. Everlene Farrier last month due to orthostatic dizziness.  Blood pressures have been good at home off of medication.   In May, he was taking a cholesterol medicine but had pain in his legs so he stopped. He currently takes fish oil and vitamins. Patient has lost weight over the last few months. Patient did have some occasional mild dizziness when laying down at night the past winter.   Patient is UTD on tetanus, flu shot and shingles, and he has had his first shot of PNA. Patient has an eye exam every year with cataracts in Jan and sees his dentist every 6 months.   Both of his parents are deceased. His mother died at 3 of old age and sudden heart problems and his father died at 50 due to cirrhosis and possibly hepatitis. Patient has 2 sisters and 1 brother; his brother developed CVA with rheumatoid arthritis 2 years ago.   Patient also has a hx of angioedema for which he takes cetirizine daily.   Patient has been married for 43 years with 2 daughters; one in [place] and one here. 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 Patient currently lives with his wife. Previously smoked, but quit when he was 57. Patient  does not use EtOH regularly, but only on special occasions. He goes to the gym 3 days a week but exercises 7 days a week. Patient still does everything independently. He does have a living will and would like to be resuscitated if necessary.   He has allergies to amoxicillin and darvon.   Patient takes goody powder every day for arthritis pains.  He usually sleeps around 10-11 PM. Patient notes bowel movement every day and does snore at night. He has noted frequent urination at night, as many as 5 times a night, but he denies weak stream or urine leaking. Patient has occasional ringing in his ears. He is followed by Dr. Nevada Crane for skin cancer on his right shoulder and left arm. Patient notes less frequent erections and that he masturbates occasionally to clear it out, although he has not done so lately. He states that he has regular irritability, but not outside of baseline. He has had a vasectomy done in the past. He denies shoulder pain, CP, palpitations, HA, SOB, cough, numbness and tingling, constipation, blood in stool, melena, diarrhea, vomiting, abdominal pain, prostate infection, dysuria, weak stream, trouble sleeping, and anxiety. Patient denies hx of MI, CVA, CA, and DM.   Review of Systems  Constitutional: Positive for unexpected weight change (decrease). Negative for fever, chills, diaphoresis, activity change, appetite change and fatigue.  HENT: Positive for sneezing. Negative for congestion, dental problem, drooling, ear discharge, ear pain, facial swelling, hearing loss, mouth sores, nosebleeds, postnasal drip, rhinorrhea, sinus pressure, sore throat, tinnitus, trouble swallowing and voice change.   Eyes: Negative for photophobia, pain, discharge, redness, itching and visual disturbance.  Respiratory: Negative for apnea, cough, choking, chest tightness, shortness of breath, wheezing and stridor.   Cardiovascular: Negative for chest pain, palpitations and leg swelling.  Gastrointestinal:  Negative for nausea, vomiting, abdominal pain, diarrhea, constipation and blood in stool.  Endocrine: Negative for cold intolerance, heat intolerance, polydipsia, polyphagia and polyuria.  Genitourinary: Positive for frequency. Negative for dysuria, urgency, hematuria, flank pain, decreased urine volume, discharge, penile swelling, scrotal swelling, enuresis, difficulty urinating, genital sores, penile pain and testicular pain.  Musculoskeletal: Negative for arthralgias, back pain, gait problem, joint swelling, myalgias, neck pain and neck stiffness.  Skin: Negative for color change, pallor, rash and wound.  Allergic/Immunologic: Positive for environmental allergies. Negative for food allergies and immunocompromised state.  Neurological: Positive for dizziness. Negative for tremors, seizures, syncope, facial asymmetry, speech difficulty, weakness, light-headedness, numbness and headaches.  Hematological: Negative for adenopathy. Does not bruise/bleed easily.  Psychiatric/Behavioral: Positive for agitation. Negative for suicidal ideas, hallucinations, behavioral problems, confusion, sleep disturbance, self-injury, dysphoric mood and decreased concentration. The patient is not nervous/anxious and is not hyperactive.     Past Medical History  Diagnosis Date  . Hypertension     no meds  . Hyperlipemia   . Neuromuscular disorder     numbness rt hand  . Angioedema   . Cataract   . Colon polyps      Previous polyps on colonoscopy.  [facility].    . 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  [facility].  . Arthritis    Past Surgical History  Procedure Laterality Date  . Bladder stone removal  2011  . Colonoscopy    . Upper gastrointestinal endoscopy    . Quadriceps tendon repair  2003    bilateral  . Cervical fusion  1976  . Eye surgery  2012    both cataracts  . Carpal tunnel release  01/17/2012    Procedure: CARPAL TUNNEL RELEASE;  Surgeon: Cammie Sickle., MD;  Location: [facility];  Service: Orthopedics;  Laterality: Right;  . Ulnar nerve transposition  01/17/2012    Procedure: ULNAR NERVE DECOMPRESSION/TRANSPOSITION;  Surgeon: Cammie Sickle., MD;  Location: [facility];  Service: Orthopedics;  Laterality: Right;  decompression of ulnar nerve only  at right cubital tunnel  . Fracture surgery     Allergies  Allergen Reactions  . Ace Inhibitors Cough  . Amoxicillin Nausea And Vomiting  . Darvon     Chest pain  . Toprol Xl [Metoprolol Tartrate]    Current Outpatient Prescriptions  Medication Sig Dispense Refill  . Ascorbic Acid (VITAMIN C PO) Take 1 capsule by mouth daily.      . Aspirin-Acetaminophen-Caffeine (GOODY HEADACHE PO) Take 1 packet by mouth once. As needed      . cetirizine (ZYRTEC) 10 MG tablet Take 10 mg by mouth daily.      Marland Kitchen EPINEPHrine (EPIPEN) 0.3 mg/0.3 mL DEVI Inject 0.3 mLs (0.3 mg total) into the muscle once.  2 Device  1  . fish oil-omega-3 fatty acids 1000 MG capsule Take 1 g by mouth 2 (two) times daily.       . fluticasone (FLONASE) 50 MCG/ACT nasal spray AS NEEDED      .  GARLIC PO Take 952 mg by mouth 2 (two) times daily.       Marland Kitchen GLUCOSAMINE PO Take 30 mLs by mouth every morning. 2 oz      . Multiple Vitamin (MULTIVITAMIN) tablet Take 1 tablet by mouth daily.      . niacin 500 MG tablet Take 500 mg by mouth Nightly.       Marland Kitchen PRESCRIPTION MEDICATION Policosanol 20 mg taking 1 daily at night time.      . saw palmetto 160 MG capsule Take 160 mg by mouth 2 (two) times daily.       Current Facility-Administered Medications  Medication Dose Route Frequency Provider Last Rate Last Dose  . loratadine (CLARITIN) tablet 10 mg  10 mg Oral Daily Orma Flaming, MD           Objective:    BP 127/85  Pulse 85  Temp(Src) 97.6 F (36.4 C)  Resp 16  Ht 5' 11.5" (1.816 m)  Wt 216 lb (97.977 kg)  BMI 29.71 kg/m2  SpO2 96% Physical Exam  Nursing note and vitals reviewed. Constitutional: He is oriented to person,  place, and time. He appears well-developed and well-nourished. No distress.  HENT:  Head: Normocephalic and atraumatic.  Right Ear: External ear normal.  Left Ear: External ear normal.  Nose: Nose normal.  Mouth/Throat: Oropharynx is clear and moist. No oropharyngeal exudate.  Eyes: Conjunctivae and EOM are normal. Pupils are equal, round, and reactive to light.  Neck: Normal range of motion. Neck supple. Carotid bruit is not present. No thyromegaly present.  Cardiovascular: Normal rate, regular rhythm, normal heart sounds and intact distal pulses.  Exam reveals no gallop and no friction rub.   No murmur heard. Pulmonary/Chest: Effort normal and breath sounds normal. No respiratory distress. He has no wheezes. He has no rales.  Abdominal: Soft. Bowel sounds are normal. He exhibits no distension and no mass. There is no tenderness. There is no rebound and no guarding. Hernia confirmed negative in the right inguinal area and confirmed negative in the left inguinal area.  Genitourinary: Rectum normal, prostate normal and penis normal. 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 Left testis shows no mass, no swelling and no tenderness. Circumcised. No penile tenderness.  Musculoskeletal: Normal range of motion. He exhibits no edema.       Right shoulder: Normal.       Left shoulder: Normal.       Cervical back: Normal.  Lymphadenopathy:    He has no cervical adenopathy.       Right: No inguinal adenopathy present.       Left: No inguinal adenopathy present.  Neurological: He is alert and oriented to person, place, and time. He has normal reflexes. No cranial nerve deficit. He exhibits normal muscle tone. Coordination normal.  Skin: Skin is warm and dry. No rash noted. He is not diaphoretic.  Diffuse sun related changes on extremities and torso.  Psychiatric: He has a normal mood and affect. His behavior is normal. Judgment and thought content normal.   Results for orders placed in visit on 08/11/14  CBC  WITH DIFFERENTIAL      Result Value Ref Range   WBC 5.0  4.0 - 10.5 K/uL   RBC 5.45  4.22 - 5.81 MIL/uL   Hemoglobin 16.6  13.0 - 17.0 g/dL   HCT 46.5  39.0 - 52.0 %   MCV 85.3  78.0 - 100.0 fL   MCH 30.5  26.0 - 34.0 pg  MCHC 35.7  30.0 - 36.0 g/dL   RDW 13.3  11.5 - 15.5 %   Platelets 268  150 - 400 K/uL   Neutrophils Relative % 59  43 - 77 %   Neutro Abs 3.0  1.7 - 7.7 K/uL   Lymphocytes Relative 27  12 - 46 %   Lymphs Abs 1.4  0.7 - 4.0 K/uL   Monocytes Relative 12  3 - 12 %   Monocytes Absolute 0.6  0.1 - 1.0 K/uL   Eosinophils Relative 2  0 - 5 %   Eosinophils Absolute 0.1  0.0 - 0.7 K/uL   Basophils Relative 0  0 - 1 %   Basophils Absolute 0.0  0.0 - 0.1 K/uL   Smear Review Criteria for review not met    COMPREHENSIVE METABOLIC PANEL      Result Value Ref Range   Sodium 137  135 - 145 mEq/L   Potassium 4.3  3.5 - 5.3 mEq/L   Chloride 100  96 - 112 mEq/L   CO2 29  19 - 32 mEq/L   Glucose, Bld 97  70 - 99 mg/dL   BUN 13  6 - 23 mg/dL   Creat 1.11  0.50 - 1.35 mg/dL   Total Bilirubin 1.0  0.2 - 1.2 mg/dL   Alkaline Phosphatase 71  39 - 117 U/L   AST 26  0 - 37 U/L   ALT 24  0 - 53 U/L   Total Protein 7.7  6.0 - 8.3 g/dL   Albumin 5.0  3.5 - 5.2 g/dL   Calcium 9.8  8.4 - 10.5 mg/dL  LIPID PANEL      Result Value Ref Range   Cholesterol 226 (*) 0 - 200 mg/dL   Triglycerides 203 (*) <150 mg/dL   HDL 39 (*) >39 mg/dL   Total CHOL/HDL Ratio 5.8     VLDL 41 (*) 0 - 40 mg/dL   LDL Cholesterol 146 (*) 0 - 99 mg/dL  PSA      Result Value Ref Range   PSA 1.15  <=4.00 ng/mL  POCT URINALYSIS DIPSTICK      Result Value Ref Range   Color, UA yellow     Clarity, UA clear     Glucose, UA neg     Bilirubin, UA neg     Ketones, UA neg     Spec Grav, UA 1.015     Blood, UA neg     pH, UA 7.5     Protein, UA neg]     Urobilinogen, UA 0.2     Nitrite, UA neg     Leukocytes, UA Negative     PREVNAR-13 ADMINISTERED IN OFFICE.    Assessment & Plan:   1. Encounter for  Medicare annual wellness exam   2. Essential hypertension   3. Lipids abnormal   4. Screening for prostate cancer   5. Need for prophylactic vaccination with Streptococcus pneumoniae (Pneumococcus) and Influenza vaccines     1. Annual Wellness Exam:  Anticipatory guidance --- recommend developing formalized advanced directives including HCPOA and Living Will; desires FULL CODE but no prolonged measures.  2.  HTN: controlled off of medication.   3.  Hyperlipidemia: stable; obtain labs; stopped statin in past six months.  4.  Screening for prostate cancer: DRE completed and PSA obtained.   5.  S/p Prevnar-13.     No orders of the defined types were placed in this encounter.    Return in  about 1 year (around 08/12/2015) for complete physical examiniation.   I personally performed the services described in this documentation, which was scribed in my presence.  The recorded information has been reviewed and is accurate.  Reginia Forts, M.D.  [facility] [address] [place], [place]  [address] [phone] phone [phone] fax

## 2014-08-12 LAB — PSA: PSA: 1.15 ng/mL (ref <=4.00)

## 2014-08-14 ENCOUNTER — Encounter: Payer: Self-pay | Admitting: Family Medicine

## 2014-08-23 ENCOUNTER — Encounter: Payer: Self-pay | Admitting: Family Medicine

## 2014-10-17 ENCOUNTER — Other Ambulatory Visit: Payer: Self-pay | Admitting: Internal Medicine

## 2014-10-17 NOTE — Telephone Encounter (Signed)
Updated sig according to Dr Thompson Caul lab result notes 08/2014.

## 2015-01-25 ENCOUNTER — Other Ambulatory Visit: Payer: Self-pay | Admitting: Family Medicine

## 2015-02-21 ENCOUNTER — Other Ambulatory Visit: Payer: Self-pay | Admitting: Family Medicine

## 2015-02-23 NOTE — Telephone Encounter (Signed)
Call --- I provided pt with one month refill of Atorvastatin; he is due for six month follow-up for cholesterol check. He must be seen to receive further refills.  Please schedule OV with me in upcoming month for repeat cholesterol check. Pt should be fasting for appointment.

## 2015-02-23 NOTE — Telephone Encounter (Signed)
Dr Tamala Julian, pt has scheduled appt, but not until 08/2015 for a CPE. Do you need to see him before then for med check up?

## 2015-02-24 NOTE — Telephone Encounter (Signed)
Left message for patient to call back to schedule an appointment with Dr Tamala Julian for a follow-up.

## 2015-03-09 ENCOUNTER — Ambulatory Visit (INDEPENDENT_AMBULATORY_CARE_PROVIDER_SITE_OTHER): Payer: Medicare Other | Admitting: Family Medicine

## 2015-03-09 ENCOUNTER — Encounter: Payer: Self-pay | Admitting: Family Medicine

## 2015-03-09 VITALS — BP 146/76 | HR 96 | Temp 98.2°F | Resp 16 | Ht 71.5 in | Wt 223.2 lb

## 2015-03-09 DIAGNOSIS — S86812A Strain of other muscle(s) and tendon(s) at lower leg level, left leg, initial encounter: Secondary | ICD-10-CM

## 2015-03-09 DIAGNOSIS — E785 Hyperlipidemia, unspecified: Secondary | ICD-10-CM | POA: Diagnosis not present

## 2015-03-09 DIAGNOSIS — J301 Allergic rhinitis due to pollen: Secondary | ICD-10-CM

## 2015-03-09 DIAGNOSIS — S86912A Strain of unspecified muscle(s) and tendon(s) at lower leg level, left leg, initial encounter: Secondary | ICD-10-CM

## 2015-03-09 DIAGNOSIS — IMO0001 Reserved for inherently not codable concepts without codable children: Secondary | ICD-10-CM

## 2015-03-09 DIAGNOSIS — R030 Elevated blood-pressure reading, without diagnosis of hypertension: Secondary | ICD-10-CM

## 2015-03-09 NOTE — Patient Instructions (Signed)

## 2015-03-09 NOTE — Progress Notes (Signed)
Subjective:    Patient ID: Todd [name], male    DOB: [birth year], 73 y.o.   MRN: [id number]  03/09/2015  Follow-up   HPI This 74 y.o. male presents for evaluation of hyperlipidemia.  Six months ago, started on Lipitor 20mg  1/2 tablet daily.  Has known hand OA with joint swelling and ongoing L knee pain.  Myalgias did not improve after holding statin for six months.  Doing well on Lipitor 20mg  1/2 tablet daily at this time.  Denies CP/palp/SOB/leg swelling. Denies HA/dizziness/focal weakness/paresthesias.    2.  L knee strain: onset one year ago;  s/p evaluation by Dr. Fara Chute; s/p xrays.  Changed work out; no more squatting.  This past week, L knee pain has improved. + L lateral and posterior pain.  Can walk all day and climb steps without pain or swelling.  With prolonged sitting, develops pain.  Taking Aleve one per day with food with relief.  Started Aleve two weeks ago.  Went on cruise in past month; this week, doing a real walk one mile; doing 65 steps with improvement in knee pain.  Also, quit doing reverse curls.  Does an elliptical.  Stopped cholesterol medication in 12/2014.  Then restarted medication in October 2015.  Realized that knee pain not due to cholesterol medication.  Goes to the gym.  Also very active outside of gym.    3. HTN: blood pressure checked daily; 117/82; last night 120/70s.  Stopped medication one year ago.   Review of Systems  Constitutional: Negative for fever, chills, diaphoresis, activity change, appetite change and fatigue.  Respiratory: Negative for cough and shortness of breath.   Cardiovascular: Negative for chest pain, palpitations and leg swelling.  Gastrointestinal: Negative for nausea, vomiting, abdominal pain and diarrhea.  Endocrine: Negative for cold intolerance, heat intolerance, polydipsia, polyphagia and polyuria.  Musculoskeletal: Positive for joint swelling and arthralgias.  Skin: Negative for color change, rash and wound.  Neurological:  Negative for dizziness, tremors, seizures, syncope, facial asymmetry, speech difficulty, weakness, light-headedness, numbness and headaches.  Psychiatric/Behavioral: Negative for sleep disturbance and dysphoric mood. The patient is not nervous/anxious.     Past Medical History  Diagnosis Date  . Hypertension     no meds  . Hyperlipemia   . Neuromuscular disorder     numbness rt hand  . Angioedema   . Cataract   . Colon polyps      Previous polyps on colonoscopy.  [facility].    . 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  [facility].  . Arthritis   . Hiatal hernia 10/20/2013    EGD confirmed.  [facility].  . Cancer     skin cancer multiple; followed by [facility]/dermatology yearly.   Past Surgical History  Procedure Laterality Date  . Bladder stone removal  2011  . Colonoscopy  10/20/2013    [facility].  Polyps.   Marland Kitchen Upper gastrointestinal endoscopy    . Quadriceps tendon repair  2003    bilateral  . Cervical fusion  1976  . Eye surgery  2012    both cataracts  . Carpal tunnel release  01/17/2012    Procedure: CARPAL TUNNEL RELEASE;  Surgeon: [name]., MD;  Location: [facility];  Service: Orthopedics;  Laterality: Right;  . Ulnar nerve transposition  01/17/2012    Procedure: ULNAR NERVE DECOMPRESSION/TRANSPOSITION;  Surgeon: [name]., MD;  Location: [facility];  Service: Orthopedics;  Laterality: Right;  decompression of ulnar  nerve only  at right cubital tunnel  . Fracture surgery    . Esophagogastroduodenoscopy  10/20/2013    [facility]; biopsy of area concernig for Barrett's; gastritis.  . Vasectomy     Allergies  Allergen Reactions  . Ace Inhibitors Cough  . Amoxicillin Nausea And Vomiting  . Darvon     Chest pain  . Toprol Xl [Metoprolol Tartrate]    Current Outpatient Prescriptions  Medication Sig Dispense Refill  . Ascorbic Acid (VITAMIN C PO) Take 1 capsule by mouth daily.    . Aspirin-Acetaminophen-Caffeine (GOODY HEADACHE PO) Take 1  packet by mouth once. As needed    . atorvastatin (LIPITOR) 20 MG tablet Take 0.5 tablets (10 mg total) by mouth daily. 15 tablet 0  . cetirizine (ZYRTEC) 10 MG tablet Take 10 mg by mouth daily.    . fish oil-omega-3 fatty acids 1000 MG capsule Take 1 g by mouth 2 (two) times daily.     . fluticasone (FLONASE) 50 MCG/ACT nasal spray AS NEEDED    . GARLIC PO Take 725 mg by mouth 2 (two) times daily.     . Glucosamine HCl (GLUCOSAMINE PO) Take by mouth daily.    . Multiple Vitamin (MULTIVITAMIN) tablet Take 1 tablet by mouth daily.    . Naproxen Sodium (ALEVE PO) Take by mouth as needed.    . niacin 500 MG tablet Take 500 mg by mouth Nightly.     Marland Kitchen PRESCRIPTION MEDICATION Policosanol 20 mg taking 1 daily at night time.    . saw palmetto 160 MG capsule Take 160 mg by mouth 2 (two) times daily.    Marland Kitchen EPINEPHrine (EPIPEN) 0.3 mg/0.3 mL DEVI Inject 0.3 mLs (0.3 mg total) into the muscle once. (Patient not taking: Reported on 03/09/2015) 2 Device 1   Current Facility-Administered Medications  Medication Dose Route Frequency Provider Last Rate Last Dose  . loratadine (CLARITIN) tablet 10 mg  10 mg Oral Daily Jonita Albee, MD           Objective:    BP 146/76 mmHg  Pulse 96  Temp(Src) 98.2 F (36.8 C) (Oral)  Resp 16  Ht 5' 11.5" (1.816 m)  Wt 223 lb 3.2 oz (101.243 kg)  BMI 30.70 kg/m2  SpO2 96% Physical Exam  Constitutional: Todd [name] is oriented to person, place, and time. Todd [name] appears well-developed and well-nourished. No distress.  HENT:  Head: Normocephalic and atraumatic.  Right Ear: External ear normal.  Left Ear: External ear normal.  Nose: Nose normal.  Mouth/Throat: Oropharynx is clear and moist.  Eyes: Conjunctivae and EOM are normal. Pupils are equal, round, and reactive to light.  Neck: Normal range of motion. Neck supple. Carotid bruit is not present. No thyromegaly present.  Cardiovascular: Normal rate, regular rhythm, normal heart sounds and intact distal pulses.  Exam reveals  no gallop and no friction rub.   No murmur heard. Pulmonary/Chest: Effort normal and breath sounds normal. Todd [name] has no wheezes. Todd [name] has no rales.  Abdominal: Soft. Bowel sounds are normal. Todd [name] exhibits no distension and no mass. There is no tenderness. There is no rebound and no guarding.  Musculoskeletal:       Left knee: Todd [name] exhibits normal range of motion, no swelling, no effusion and no bony tenderness. No tenderness found. No medial joint line, no lateral joint line, no MCL, no LCL and no patellar tendon tenderness noted.  Lymphadenopathy:    Todd [name] has no cervical adenopathy.  Neurological: Todd [name] is alert and oriented to person,  place, and time. No cranial nerve deficit.  Skin: Skin is warm and dry. No rash noted. Todd [name] is not diaphoretic.  Psychiatric: Todd [name] has a normal mood and affect. His behavior is normal.  Nursing note and vitals reviewed.  Results for orders placed or performed in visit on 08/11/14  CBC with Differential  Result Value Ref Range   WBC 5.0 4.0 - 10.5 K/uL   RBC 5.45 4.22 - 5.81 MIL/uL   Hemoglobin 16.6 13.0 - 17.0 g/dL   HCT 32.4 40.1 - 02.7 %   MCV 85.3 78.0 - 100.0 fL   MCH 30.5 26.0 - 34.0 pg   MCHC 35.7 30.0 - 36.0 g/dL   RDW 25.3 66.4 - 40.3 %   Platelets 268 150 - 400 K/uL   Neutrophils Relative % 59 43 - 77 %   Neutro Abs 3.0 1.7 - 7.7 K/uL   Lymphocytes Relative 27 12 - 46 %   Lymphs Abs 1.4 0.7 - 4.0 K/uL   Monocytes Relative 12 3 - 12 %   Monocytes Absolute 0.6 0.1 - 1.0 K/uL   Eosinophils Relative 2 0 - 5 %   Eosinophils Absolute 0.1 0.0 - 0.7 K/uL   Basophils Relative 0 0 - 1 %   Basophils Absolute 0.0 0.0 - 0.1 K/uL   Smear Review Criteria for review not met   Comprehensive metabolic panel  Result Value Ref Range   Sodium 137 135 - 145 mEq/L   Potassium 4.3 3.5 - 5.3 mEq/L   Chloride 100 96 - 112 mEq/L   CO2 29 19 - 32 mEq/L   Glucose, Bld 97 70 - 99 mg/dL   BUN 13 6 - 23 mg/dL   Creat 4.74 2.59 - 5.63 mg/dL   Total Bilirubin 1.0 0.2 - 1.2 mg/dL    Alkaline Phosphatase 71 39 - 117 U/L   AST 26 0 - 37 U/L   ALT 24 0 - 53 U/L   Total Protein 7.7 6.0 - 8.3 g/dL   Albumin 5.0 3.5 - 5.2 g/dL   Calcium 9.8 8.4 - 87.5 mg/dL  Lipid panel  Result Value Ref Range   Cholesterol 226 (H) 0 - 200 mg/dL   Triglycerides 643 (H) <150 mg/dL   HDL 39 (L) >32 mg/dL   Total CHOL/HDL Ratio 5.8 Ratio   VLDL 41 (H) 0 - 40 mg/dL   LDL Cholesterol 951 (H) 0 - 99 mg/dL  PSA  Result Value Ref Range   PSA 1.15 <=4.00 ng/mL  POCT urinalysis dipstick  Result Value Ref Range   Color, UA yellow    Clarity, UA clear    Glucose, UA neg    Bilirubin, UA neg    Ketones, UA neg    Spec Grav, UA 1.015    Blood, UA neg    pH, UA 7.5    Protein, UA neg]    Urobilinogen, UA 0.2    Nitrite, UA neg    Leukocytes, UA Negative        Assessment & Plan:   1. Hyperlipidemia   2. Knee strain, left, initial encounter   3. Blood pressure elevated   4. Allergic rhinitis due to pollen     1. Hyperlipidemia: uncontrolled; tolerating Lipitor 20mg  1/2 tablet daily; RTC tomorrow for fasting labs.  Will adjust Lipitor as warranted. 2.  L knee strain/tendonitis: persistent with mild improvement in past two weeks with Aleve once daily, walking.  S/p ortho consultation without abnormal xrays; benign exam in office; proceed  with current treatment plan. 3.  Blood pressure elevated: stable home readings; continue to monitor daily at home.  No current medications and doing well. 4.  Allergic Rhinitis: worsening during allergy season; continue current medications.   Meds ordered this encounter  Medications  . Naproxen Sodium (ALEVE PO)    Sig: Take by mouth as needed.  . Glucosamine HCl (GLUCOSAMINE PO)    Sig: Take by mouth daily.    Return in about 6 months (around 09/09/2015) for complete physical examiniation.     Chalon Zobrist Paulita Fujita, M.D. [facility] [address] [place], [place]  [address] [phone] phone [phone]  fax

## 2015-03-10 ENCOUNTER — Other Ambulatory Visit (INDEPENDENT_AMBULATORY_CARE_PROVIDER_SITE_OTHER): Payer: Medicare Other

## 2015-03-10 DIAGNOSIS — E785 Hyperlipidemia, unspecified: Secondary | ICD-10-CM

## 2015-03-10 LAB — CBC WITH DIFFERENTIAL/PLATELET
Basophils Absolute: 0 10*3/uL (ref 0.0–0.1)
Basophils Relative: 0 % (ref 0–1)
EOS ABS: 0.2 10*3/uL (ref 0.0–0.7)
Eosinophils Relative: 5 % (ref 0–5)
HEMATOCRIT: 45.4 % (ref 39.0–52.0)
Hemoglobin: 15.9 g/dL (ref 13.0–17.0)
Lymphocytes Relative: 25 % (ref 12–46)
Lymphs Abs: 1.2 10*3/uL (ref 0.7–4.0)
MCH: 29.7 pg (ref 26.0–34.0)
MCHC: 35 g/dL (ref 30.0–36.0)
MCV: 84.7 fL (ref 78.0–100.0)
MONO ABS: 0.6 10*3/uL (ref 0.1–1.0)
MONOS PCT: 13 % — AB (ref 3–12)
MPV: 10.4 fL (ref 8.6–12.4)
NEUTROS ABS: 2.7 10*3/uL (ref 1.7–7.7)
Neutrophils Relative %: 57 % (ref 43–77)
Platelets: 234 10*3/uL (ref 150–400)
RBC: 5.36 MIL/uL (ref 4.22–5.81)
RDW: 13.9 % (ref 11.5–15.5)
WBC: 4.7 10*3/uL (ref 4.0–10.5)

## 2015-03-10 LAB — COMPREHENSIVE METABOLIC PANEL
ALT: 33 U/L (ref 0–53)
AST: 26 U/L (ref 0–37)
Albumin: 4.3 g/dL (ref 3.5–5.2)
Alkaline Phosphatase: 78 U/L (ref 39–117)
BILIRUBIN TOTAL: 0.7 mg/dL (ref 0.2–1.2)
BUN: 19 mg/dL (ref 6–23)
CO2: 24 mEq/L (ref 19–32)
CREATININE: 0.94 mg/dL (ref 0.50–1.35)
Calcium: 9.1 mg/dL (ref 8.4–10.5)
Chloride: 104 mEq/L (ref 96–112)
Glucose, Bld: 104 mg/dL — ABNORMAL HIGH (ref 70–99)
Potassium: 4.3 mEq/L (ref 3.5–5.3)
SODIUM: 137 meq/L (ref 135–145)
TOTAL PROTEIN: 6.9 g/dL (ref 6.0–8.3)

## 2015-03-10 LAB — LIPID PANEL
Cholesterol: 149 mg/dL (ref 0–200)
HDL: 44 mg/dL (ref >=40)
LDL Cholesterol: 79 mg/dL (ref 0–99)
Total CHOL/HDL Ratio: 3.4 Ratio
Triglycerides: 131 mg/dL (ref <150)
VLDL: 26 mg/dL (ref 0–40)

## 2015-03-10 NOTE — Progress Notes (Signed)
Pt is here for lab work only. 

## 2015-03-13 ENCOUNTER — Other Ambulatory Visit: Payer: Self-pay | Admitting: Family Medicine

## 2015-03-13 ENCOUNTER — Telehealth: Payer: Self-pay

## 2015-03-13 MED ORDER — ATORVASTATIN CALCIUM 20 MG PO TABS: 10.0000 mg | ORAL_TABLET | Freq: Every day | ORAL | Status: DC

## 2015-03-13 NOTE — Telephone Encounter (Signed)
Refill on lipitor.

## 2015-03-13 NOTE — Telephone Encounter (Signed)
Pt would like a refill on his cholesterol medication, he uses [facility] on [address]  Best# [phone]

## 2015-03-13 NOTE — Telephone Encounter (Signed)
Refilled. Pt notified.

## 2015-03-22 ENCOUNTER — Ambulatory Visit (INDEPENDENT_AMBULATORY_CARE_PROVIDER_SITE_OTHER): Payer: Medicare Other | Admitting: Family Medicine

## 2015-03-22 VITALS — BP 128/80 | HR 86 | Temp 98.6°F | Resp 18 | Ht 71.5 in | Wt 229.0 lb

## 2015-03-22 DIAGNOSIS — K148 Other diseases of tongue: Secondary | ICD-10-CM

## 2015-03-22 DIAGNOSIS — J384 Edema of larynx: Secondary | ICD-10-CM

## 2015-03-22 DIAGNOSIS — J0100 Acute maxillary sinusitis, unspecified: Secondary | ICD-10-CM

## 2015-03-22 DIAGNOSIS — T783XXD Angioneurotic edema, subsequent encounter: Secondary | ICD-10-CM

## 2015-03-22 MED ORDER — CEFDINIR 300 MG PO CAPS: 300.0000 mg | ORAL_CAPSULE | Freq: Two times a day (BID) | ORAL | Status: DC

## 2015-03-22 MED ORDER — EPINEPHRINE 0.15 MG/0.3ML IJ SOAJ: 0.1500 mg | INTRAMUSCULAR | Status: DC | PRN

## 2015-03-22 MED ORDER — CETIRIZINE HCL 10 MG PO TABS: 10.0000 mg | ORAL_TABLET | Freq: Every day | ORAL | Status: DC

## 2015-03-22 MED ORDER — GUAIFENESIN ER 1200 MG PO TB12: 1.0000 | ORAL_TABLET | Freq: Two times a day (BID) | ORAL | Status: DC | PRN

## 2015-03-22 MED ORDER — FLUTICASONE PROPIONATE 50 MCG/ACT NA SUSP: 2.0000 | Freq: Every day | NASAL | Status: DC

## 2015-03-22 NOTE — Patient Instructions (Signed)

## 2015-03-22 NOTE — Progress Notes (Signed)
Subjective:  This chart was scribed for Delman Cheadle MD, by Tamsen Roers, at [facility]  This patient was seen in room 9 and the patient's care was started at 9:21 AM.    Patient ID: Todd [name], male    DOB: 08-Oct-1941, 74 y.o.   MRN: [id number] Chief Complaint  Patient presents with  . URI    x 1 week   . Ear Pain    URI  Associated symptoms include congestion, ear pain, headaches, rhinorrhea and a sore throat. Pertinent negatives include no coughing, nausea or vomiting.    HPI Comments: Todd [name] is a 74 y.o. male who presents to the [facility] complaining of ear pain, sinus pressure, congestion, a scratchy throat and headache onset a week ago.  Has associated symptoms of post nasal drip and is getting up 2-3 times a night.  Patient takes Flonase and cetrizine everyday and took aleve for relief. He currenly denies a cough.  He has not been on antibiotics for the last three months and this is the first time he has come in for sinus issues.      Patient has a history of vertigo in the past.    He is also requesting an epi pen today since he has had an allergic reaction in the past.    Patient has no other complaints today.      Past Medical History  Diagnosis Date  . Hypertension     no meds  . Hyperlipemia   . Neuromuscular disorder     numbness rt hand  . Angioedema   . Cataract   . Colon polyps      Previous polyps on colonoscopy.  [facility].    . 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  [facility].  . Arthritis   . Hiatal hernia 10/20/2013    EGD confirmed.  [facility].  . Cancer     skin cancer multiple; followed by [facility]/dermatology yearly.    Current Outpatient Prescriptions on File Prior to Visit  Medication Sig Dispense Refill  . Ascorbic Acid (VITAMIN C PO) Take 1 capsule by mouth daily.    . Aspirin-Acetaminophen-Caffeine (GOODY HEADACHE PO) Take 1 packet by mouth once. As needed    . atorvastatin (LIPITOR) 20 MG  tablet Take 0.5 tablets (10 mg total) by mouth daily. 30 tablet 3  . cetirizine (ZYRTEC) 10 MG tablet Take 10 mg by mouth daily.    . fish oil-omega-3 fatty acids 1000 MG capsule Take 1 g by mouth 2 (two) times daily.     . fluticasone (FLONASE) 50 MCG/ACT nasal spray AS NEEDED    . GARLIC PO Take 166 mg by mouth 2 (two) times daily.     . Glucosamine HCl (GLUCOSAMINE PO) Take by mouth daily.    . Multiple Vitamin (MULTIVITAMIN) tablet Take 1 tablet by mouth daily.    . Naproxen Sodium (ALEVE PO) Take by mouth as needed.    . niacin 500 MG tablet Take 500 mg by mouth Nightly.     Marland Kitchen PRESCRIPTION MEDICATION Policosanol 20 mg taking 1 daily at night time.    . saw palmetto 160 MG capsule Take 160 mg by mouth 2 (two) times daily.    Marland Kitchen EPINEPHrine (EPIPEN) 0.3 mg/0.3 mL DEVI Inject 0.3 mLs (0.3 mg total) into the muscle once. (Patient not taking: Reported on 03/09/2015) 2 Device 1   Current Facility-Administered Medications on File Prior to  Visit  Medication Dose Route Frequency Provider Last Rate Last Dose  . loratadine (CLARITIN) tablet 10 mg  10 mg Oral Daily Orma Flaming, MD        Filed Vitals:   03/22/15 0852  BP: 128/80  Pulse: 86  Temp: 98.6 F (37 C)  Resp: 18     Review of Systems  Constitutional: Negative for fever and chills.  HENT: Positive for congestion, ear pain, rhinorrhea, sinus pressure and sore throat.   Respiratory: Negative for cough, choking and shortness of breath.   Gastrointestinal: Negative for nausea and vomiting.  Neurological: Positive for headaches.       Objective:   Physical Exam  Constitutional: He appears well-developed and well-nourished. No distress.  HENT:  Head: Normocephalic and atraumatic.  Left TM is severely retracted and injected with erythema.   Nasal mucosa - erythema edema and purulent rhinorrhea.  oraphayrnx- erythema and petechia Clear postnasal drip Right TM had a little bit of retraction.   Eyes: Pupils are equal, round, and  reactive to light. Right eye exhibits no discharge. Left eye exhibits no discharge.  Neck: No thyromegaly present.  Cardiovascular: Normal rate, regular rhythm, S1 normal, S2 normal and normal heart sounds.   No murmur heard. Pulmonary/Chest: Effort normal and breath sounds normal. No respiratory distress. He has no wheezes. He has no rales.  Good air movement.   Lymphadenopathy:    He has no cervical adenopathy.  Skin: He is not diaphoretic.  Psychiatric: He has a normal mood and affect. His behavior is normal.  Nursing note and vitals reviewed.         Assessment & Plan:  Omnicef  Mucin ex.   1. Acute maxillary sinusitis, recurrence not specified   2. Angioedema, subsequent encounter - h/o, non currently  3. Tongue edema    refilled epipen  Meds ordered this encounter  Medications  . cefdinir (OMNICEF) 300 MG capsule    Sig: Take 1 capsule (300 mg total) by mouth 2 (two) times daily.    Dispense:  20 capsule    Refill:  0  . fluticasone (FLONASE) 50 MCG/ACT nasal spray    Sig: Place 2 sprays into both nostrils daily. AS NEEDED    Dispense:  16 g    Refill:  11  . cetirizine (ZYRTEC) 10 MG tablet    Sig: Take 1 tablet (10 mg total) by mouth at bedtime.    Dispense:  30 tablet    Refill:  11  . EPINEPHrine (EPIPEN JR) 0.15 MG/0.3ML injection    Sig: Inject 0.3 mLs (0.15 mg total) into the muscle as needed for anaphylaxis.    Dispense:  1 each    Refill:  1  . Guaifenesin (MUCINEX MAXIMUM STRENGTH) 1200 MG TB12    Sig: Take 1 tablet (1,200 mg total) by mouth every 12 (twelve) hours as needed.    Dispense:  14 tablet    Refill:  1    I personally performed the services described in this documentation, which was scribed in my presence. The recorded information has been reviewed and considered, and addended by me as needed.  Delman Cheadle, MD MPH

## 2015-04-28 ENCOUNTER — Ambulatory Visit (INDEPENDENT_AMBULATORY_CARE_PROVIDER_SITE_OTHER): Payer: Medicare Other | Admitting: Internal Medicine

## 2015-04-28 ENCOUNTER — Ambulatory Visit: Payer: Self-pay

## 2015-04-28 VITALS — BP 122/82 | HR 91 | Temp 98.0°F | Resp 18 | Ht 71.75 in | Wt 224.2 lb

## 2015-04-28 DIAGNOSIS — H9202 Otalgia, left ear: Secondary | ICD-10-CM | POA: Diagnosis not present

## 2015-04-28 MED ORDER — CIPROFLOXACIN-DEXAMETHASONE 0.3-0.1 % OT SUSP: 4.0000 [drp] | Freq: Two times a day (BID) | OTIC | Status: DC

## 2015-04-28 NOTE — Progress Notes (Signed)
   Subjective:    Patient ID: Todd [name], male    DOB: 1941/05/02, 74 y.o.   MRN: [id number]  HPI This note is scribed by [facility] for Dr. Lou Miner, MD. 74 y.o male c/o ear pain in his left ear. This is an on going problem with ear infection in both ears. He does not have a sore throat. He has ringing in his ear but it has been on going since he was [age]. He hasn't experienced any hearing loss. He doesn't c/o pain while chewing or by touch. He does have a Gaffer. He has been taking Allegra D.     Review of Systems     Objective:   Physical Exam        Assessment & Plan:

## 2015-04-28 NOTE — Patient Instructions (Signed)

## 2015-07-07 ENCOUNTER — Ambulatory Visit (INDEPENDENT_AMBULATORY_CARE_PROVIDER_SITE_OTHER): Payer: Medicare Other | Admitting: Internal Medicine

## 2015-07-07 VITALS — BP 130/76 | HR 82 | Temp 97.6°F | Resp 16 | Ht 72.0 in | Wt 225.8 lb

## 2015-07-07 DIAGNOSIS — Z23 Encounter for immunization: Secondary | ICD-10-CM

## 2015-07-07 DIAGNOSIS — Z719 Counseling, unspecified: Secondary | ICD-10-CM

## 2015-07-07 DIAGNOSIS — Z7189 Other specified counseling: Secondary | ICD-10-CM | POA: Diagnosis not present

## 2015-07-07 NOTE — Progress Notes (Signed)
Patient ID: Todd [name], male   DOB: December 22, 1940, 74 y.o.   MRN: [id number]   07/07/2015 at 9:29 AM  Todd [name] / DOB: 06/22/1941 / MRN: [id number]  Problem list reviewed and updated by me where necessary.   SUBJECTIVE  Todd [name] is a 74 y.o. well appearing male presenting for the chief complaint of bp running little high, and assoc with HA at times.He kept record of BPs for last few weeks and lowest 107/62 and highest 142/88. He is not on BP meds. He exercises every day. 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    Medications reviewed and updated by myself where necessary, and exist elsewhere in the encounter.   Todd [name] is allergic to ace inhibitors; amoxicillin; darvon; and toprol xl. He  reports that he quit smoking about 41 years ago. His smoking use included Cigarettes. He has a 30 pack-year smoking history. He does not have any smokeless tobacco history on file. He reports that he does not drink alcohol or use illicit drugs. He  reports that he does not engage in sexual activity. The patient  has past surgical history that includes Bladder stone removal (2011); Colonoscopy (10/20/2013); Upper gastrointestinal endoscopy; Quadriceps tendon repair (2003); Cervical fusion (1976); Eye surgery (2012); Carpal tunnel release (01/17/2012); Ulnar nerve transposition (01/17/2012); Fracture surgery; Esophagogastroduodenoscopy (10/20/2013); and Vasectomy.  His family history includes Alcohol abuse in his father; Arthritis in his brother; Cirrhosis in his father; Heart disease in his mother; Heart failure in his mother; Hypertension in his father.  Review of Systems  Constitutional: Negative for fever.  Respiratory: Negative for shortness of breath.   Cardiovascular: Negative for chest pain.  Gastrointestinal: Negative  for nausea.  Skin: Negative for rash.  Neurological: Negative for dizziness and headaches.    OBJECTIVE  His  height is 6' (1.829 m) and weight is 225 lb 12.8 oz (102.422 kg). His oral temperature is 97.6 F (36.4 C). His blood pressure is 130/76 and his pulse is 82. His respiration is 16 and oxygen saturation is 97%.  The patient's body mass index is 30.62 kg/(m^2).  Physical Exam  Constitutional: He is oriented to person, place, and time. He appears well-developed and well-nourished. No distress.  HENT:  Head: Normocephalic.  Nose: Nose normal.  Eyes: Conjunctivae and EOM are normal.  Respiratory: Effort normal.  Neurological: He is alert and oriented to person, place, and time. He exhibits normal muscle tone. Coordination normal.  Psychiatric: He has a normal mood and affect.    No results found for this or any previous visit (from the past 24 hour(s)).  ASSESSMENT & PLAN  Bence was seen today for headache, hypertension and immunizations.  Diagnoses and all orders for this visit:  Health counseling  Flu vaccine need

## 2015-07-07 NOTE — Patient Instructions (Signed)
Stress Stress-related medical problems are becoming increasingly common. The body has a built-in physical response to stressful situations. Faced with pressure, challenge or danger, we need to react quickly. Our bodies release hormones such as cortisol and adrenaline to help do this. These hormones are part of the "fight or flight" response and affect the metabolic rate, heart rate and blood pressure, resulting in a heightened, stressed state that prepares the body for optimum performance in dealing with a stressful situation. It is likely that early man required these mechanisms to stay alive, but usually modern stresses do not call for this, and the same hormones released in today's world can damage health and reduce coping ability. CAUSES  Pressure to perform at work, at school or in sports.  Threats of physical violence.  Money worries.  Arguments.  Family conflicts.  Divorce or separation from significant other.  Bereavement.  New job or unemployment.  Changes in location.  Alcohol or drug abuse. SOMETIMES, THERE IS NO PARTICULAR REASON FOR DEVELOPING STRESS. Almost all people are at risk of being stressed at some time in their lives. It is important to know that some stress is temporary and some is long term.  Temporary stress will go away when a situation is resolved. Most people can cope with short periods of stress, and it can often be relieved by relaxing, taking a walk or getting any type of exercise, chatting through issues with friends, or having a good night's sleep.  Chronic (long-term, continuous) stress is much harder to deal with. It can be psychologically and emotionally damaging. It can be harmful both for an individual and for friends and family. SYMPTOMS Everyone reacts to stress differently. There are some common effects that help us recognize it. In times of extreme stress, people may:  Shake uncontrollably.  Breathe faster and deeper than normal  (hyperventilate).  Vomit.  For people with asthma, stress can trigger an attack.  For some people, stress may trigger migraine headaches, ulcers, and body pain. PHYSICAL EFFECTS OF STRESS MAY INCLUDE:  Loss of energy.  Skin problems.  Aches and pains resulting from tense muscles, including neck ache, backache and tension headaches.  Increased pain from arthritis and other conditions.  Irregular heart beat (palpitations).  Periods of irritability or anger.  Apathy or depression.  Anxiety (feeling uptight or worrying).  Unusual behavior.  Loss of appetite.  Comfort eating.  Lack of concentration.  Loss of, or decreased, sex-drive.  Increased smoking, drinking, or recreational drug use.  For women, missed periods.  Ulcers, joint pain, and muscle pain. Post-traumatic stress is the stress caused by any serious accident, strong emotional damage, or extremely difficult or violent experience such as rape or war. Post-traumatic stress victims can experience mixtures of emotions such as fear, shame, depression, guilt or anger. It may include recurrent memories or images that may be haunting. These feelings can last for weeks, months or even years after the traumatic event that triggered them. Specialized treatment, possibly with medicines and psychological therapies, is available. If stress is causing physical symptoms, severe distress or making it difficult for you to function as normal, it is worth seeing your caregiver. It is important to remember that although stress is a usual part of life, extreme or prolonged stress can lead to other illnesses that will need treatment. It is better to visit a doctor sooner rather than later. Stress has been linked to the development of high blood pressure and heart disease, as well as insomnia and depression.   There is no diagnostic test for stress since everyone reacts to it differently. But a caregiver will be able to spot the physical  symptoms, such as:  Headaches.  Shingles.  Ulcers. Emotional distress such as intense worry, low mood or irritability should be detected when the doctor asks pertinent questions to identify any underlying problems that might be the cause. In case there are physical reasons for the symptoms, the doctor may also want to do some tests to exclude certain conditions. If you feel that you are suffering from stress, try to identify the aspects of your life that are causing it. Sometimes you may not be able to change or avoid them, but even a small change can have a positive ripple effect. A simple lifestyle change can make all the difference. STRATEGIES THAT CAN HELP DEAL WITH STRESS:  Delegating or sharing responsibilities.  Avoiding confrontations.  Learning to be more assertive.  Regular exercise.  Avoid using alcohol or street drugs to cope.  Eating a healthy, balanced diet, rich in fruit and vegetables and proteins.  Finding humor or absurdity in stressful situations.  Never taking on more than you know you can handle comfortably.  Organizing your time better to get as much done as possible.  Talking to friends or family and sharing your thoughts and fears.  Listening to music or relaxation tapes.  Relaxation techniques like deep breathing, meditation, and yoga.  Tensing and then relaxing your muscles, starting at the toes and working up to the head and neck. If you think that you would benefit from help, either in identifying the things that are causing your stress or in learning techniques to help you relax, see a caregiver who is capable of helping you with this. Rather than relying on medications, it is usually better to try and identify the things in your life that are causing stress and try to deal with them. There are many techniques of managing stress including counseling, psychotherapy, aromatherapy, yoga, and exercise. Your caregiver can help you determine what is best  for you. Document Released: 01/14/2003 Document Revised: 10/29/2013 Document Reviewed: 12/11/2007 [facility] Patient Information 2015 [place], [place]. This information is not intended to replace advice given to you by your health care provider. Make sure you discuss any questions you have with your health care provider. DASH Eating Plan DASH stands for "Dietary Approaches to Stop Hypertension." The DASH eating plan is a healthy eating plan that has been shown to reduce high blood pressure (hypertension). Additional health benefits may include reducing the risk of type 2 diabetes mellitus, heart disease, and stroke. The DASH eating plan may also help with weight loss. WHAT DO I NEED TO KNOW ABOUT THE DASH EATING PLAN? For the DASH eating plan, you will follow these general guidelines:  Choose foods with a percent daily value for sodium of less than 5% (as listed on the food label).  Use salt-free seasonings or herbs instead of table salt or sea salt.  Check with your health care provider or pharmacist before using salt substitutes.  Eat lower-sodium products, often labeled as "lower sodium" or "no salt added."  Eat fresh foods.  Eat more vegetables, fruits, and low-fat dairy products.  Choose whole grains. Look for the word "whole" as the first word in the ingredient list.  Choose fish and skinless chicken or [place] more often than red meat. Limit fish, poultry, and meat to 6 oz (170 g) each day.  Limit sweets, desserts, sugars, and sugary drinks.  Choose  heart-healthy fats.  Limit cheese to 1 oz (28 g) per day.  Eat more home-cooked food and less restaurant, buffet, and fast food.  Limit fried foods.  Cook foods using methods other than frying.  Limit canned vegetables. If you do use them, rinse them well to decrease the sodium.  When eating at a restaurant, ask that your food be prepared with less salt, or no salt if possible. WHAT FOODS CAN I EAT? Seek help from a dietitian  for individual calorie needs. Grains Whole grain or whole wheat bread. Brown rice. Whole grain or whole wheat pasta. Quinoa, bulgur, and whole grain cereals. Low-sodium cereals. Corn or whole wheat flour tortillas. Whole grain cornbread. Whole grain crackers. Low-sodium crackers. Vegetables Fresh or frozen vegetables (raw, steamed, roasted, or grilled). Low-sodium or reduced-sodium tomato and vegetable juices. Low-sodium or reduced-sodium tomato sauce and paste. Low-sodium or reduced-sodium canned vegetables.  Fruits All fresh, canned (in natural juice), or frozen fruits. Meat and Other Protein Products Ground beef (85% or leaner), grass-fed beef, or beef trimmed of fat. Skinless chicken or [place]. Ground chicken or [place]. Pork trimmed of fat. All fish and seafood. Eggs. Dried beans, peas, or lentils. Unsalted nuts and seeds. Unsalted canned beans. Dairy Low-fat dairy products, such as skim or 1% milk, 2% or reduced-fat cheeses, low-fat ricotta or cottage cheese, or plain low-fat yogurt. Low-sodium or reduced-sodium cheeses. Fats and Oils Tub margarines without trans fats. Light or reduced-fat mayonnaise and salad dressings (reduced sodium). Avocado. Safflower, olive, or canola oils. Natural peanut or almond butter. Other Unsalted popcorn and pretzels. The items listed above may not be a complete list of recommended foods or beverages. Contact your dietitian for more options. WHAT FOODS ARE NOT RECOMMENDED? Grains White bread. White pasta. White rice. Refined cornbread. Bagels and croissants. Crackers that contain trans fat. Vegetables Creamed or fried vegetables. Vegetables in a cheese sauce. Regular canned vegetables. Regular canned tomato sauce and paste. Regular tomato and vegetable juices. Fruits Dried fruits. Canned fruit in light or heavy syrup. Fruit juice. Meat and Other Protein Products Fatty cuts of meat. Ribs, chicken wings, bacon, sausage, bologna, salami, chitterlings, fatback,  hot dogs, bratwurst, and packaged luncheon meats. Salted nuts and seeds. Canned beans with salt. Dairy Whole or 2% milk, cream, half-and-half, and cream cheese. Whole-fat or sweetened yogurt. Full-fat cheeses or blue cheese. Nondairy creamers and whipped toppings. Processed cheese, cheese spreads, or cheese curds. Condiments Onion and garlic salt, seasoned salt, table salt, and sea salt. Canned and packaged gravies. Worcestershire sauce. Tartar sauce. Barbecue sauce. Teriyaki sauce. Soy sauce, including reduced sodium. Steak sauce. Fish sauce. Oyster sauce. Cocktail sauce. Horseradish. Ketchup and mustard. Meat flavorings and tenderizers. Bouillon cubes. Hot sauce. Tabasco sauce. Marinades. Taco seasonings. Relishes. Fats and Oils Butter, stick margarine, lard, shortening, ghee, and bacon fat. Coconut, palm kernel, or palm oils. Regular salad dressings. Other Pickles and olives. Salted popcorn and pretzels. The items listed above may not be a complete list of foods and beverages to avoid. Contact your dietitian for more information. WHERE CAN I FIND MORE INFORMATION? National Heart, Lung, and Blood Institute: travelstabloid.com Document Released: 10/13/2011 Document Revised: 03/10/2014 Document Reviewed: 08/28/2013 [facility] Patient Information 2015 [place], [place]. This information is not intended to replace advice given to you by your health care provider. Make sure you discuss any questions you have with your health care provider.

## 2015-08-17 ENCOUNTER — Ambulatory Visit (INDEPENDENT_AMBULATORY_CARE_PROVIDER_SITE_OTHER): Payer: Medicare Other | Admitting: Family Medicine

## 2015-08-17 ENCOUNTER — Encounter: Payer: Self-pay | Admitting: Family Medicine

## 2015-08-17 VITALS — BP 132/88 | HR 85 | Temp 98.0°F | Resp 16 | Ht 71.0 in | Wt 224.4 lb

## 2015-08-17 DIAGNOSIS — Z125 Encounter for screening for malignant neoplasm of prostate: Secondary | ICD-10-CM | POA: Diagnosis not present

## 2015-08-17 DIAGNOSIS — I10 Essential (primary) hypertension: Secondary | ICD-10-CM | POA: Diagnosis not present

## 2015-08-17 DIAGNOSIS — E785 Hyperlipidemia, unspecified: Secondary | ICD-10-CM | POA: Diagnosis not present

## 2015-08-17 DIAGNOSIS — Z0000 Encounter for general adult medical examination without abnormal findings: Secondary | ICD-10-CM

## 2015-08-17 LAB — CBC WITH DIFFERENTIAL/PLATELET
BASOS PCT: 1 % (ref 0–1)
Basophils Absolute: 0.1 10*3/uL (ref 0.0–0.1)
Eosinophils Absolute: 0.2 10*3/uL (ref 0.0–0.7)
Eosinophils Relative: 3 % (ref 0–5)
HCT: 46.4 % (ref 39.0–52.0)
HEMOGLOBIN: 16.4 g/dL (ref 13.0–17.0)
Lymphocytes Relative: 25 % (ref 12–46)
Lymphs Abs: 1.3 10*3/uL (ref 0.7–4.0)
MCH: 29.7 pg (ref 26.0–34.0)
MCHC: 35.3 g/dL (ref 30.0–36.0)
MCV: 84.1 fL (ref 78.0–100.0)
MPV: 10 fL (ref 8.6–12.4)
Monocytes Absolute: 0.7 10*3/uL (ref 0.1–1.0)
Monocytes Relative: 13 % — ABNORMAL HIGH (ref 3–12)
NEUTROS ABS: 3.1 10*3/uL (ref 1.7–7.7)
NEUTROS PCT: 58 % (ref 43–77)
Platelets: 245 10*3/uL (ref 150–400)
RBC: 5.52 MIL/uL (ref 4.22–5.81)
RDW: 13.6 % (ref 11.5–15.5)
WBC: 5.3 10*3/uL (ref 4.0–10.5)

## 2015-08-17 LAB — LIPID PANEL
CHOLESTEROL: 166 mg/dL (ref 125–200)
HDL: 38 mg/dL — ABNORMAL LOW (ref >=40)
LDL Cholesterol: 85 mg/dL (ref <130)
Total CHOL/HDL Ratio: 4.4 Ratio (ref <=5.0)
Triglycerides: 213 mg/dL — ABNORMAL HIGH (ref <150)
VLDL: 43 mg/dL — ABNORMAL HIGH (ref <30)

## 2015-08-17 LAB — POCT URINALYSIS DIP (MANUAL ENTRY)
Bilirubin, UA: NEGATIVE
Blood, UA: NEGATIVE
Glucose, UA: NEGATIVE
Ketones, POC UA: NEGATIVE
LEUKOCYTES UA: NEGATIVE
Nitrite, UA: NEGATIVE
Protein Ur, POC: NEGATIVE
Spec Grav, UA: 1.015
UROBILINOGEN UA: 0.2
pH, UA: 7

## 2015-08-17 LAB — COMPREHENSIVE METABOLIC PANEL
ALT: 32 U/L (ref 9–46)
AST: 27 U/L (ref 10–35)
Albumin: 4.7 g/dL (ref 3.6–5.1)
Alkaline Phosphatase: 81 U/L (ref 40–115)
BILIRUBIN TOTAL: 1.1 mg/dL (ref 0.2–1.2)
BUN: 16 mg/dL (ref 7–25)
CO2: 25 mmol/L (ref 20–31)
CREATININE: 0.95 mg/dL (ref 0.70–1.18)
Calcium: 9.3 mg/dL (ref 8.6–10.3)
Chloride: 100 mmol/L (ref 98–110)
Glucose, Bld: 103 mg/dL — ABNORMAL HIGH (ref 65–99)
Potassium: 4.1 mmol/L (ref 3.5–5.3)
SODIUM: 137 mmol/L (ref 135–146)
Total Protein: 7.2 g/dL (ref 6.1–8.1)

## 2015-08-17 MED ORDER — ATORVASTATIN CALCIUM 20 MG PO TABS: 10.0000 mg | ORAL_TABLET | Freq: Every day | ORAL | Status: DC

## 2015-08-17 NOTE — Progress Notes (Signed)
Subjective:    Patient ID: Todd [name], male    DOB: 1941/03/06, 74 y.o.   MRN: [id number]  08/17/2015  Annual Exam   HPI This 74 y.o. male presents for Complete Physical Examination.  Last physical:  08-11-2014 Colonoscopy: 2014 TDAP:  2011 Pneumovax:  2009; Prevnar 13 2015 Zostavax: 2009 [facility] Influenza: 07/07/2015 Eye exam:  2016; s/p cataract surgery B.   Dental exam:  Every six months.  HTN: blood pressure running 120-139/80s  Hyperlipidemia:  Patient reports good compliance with medication, good tolerance to medication, and good symptom control.      Review of Systems  Constitutional: Negative for fever, chills, diaphoresis, activity change, appetite change, fatigue and unexpected weight change.  HENT: Negative for congestion, dental problem, drooling, ear discharge, ear pain, facial swelling, hearing loss, mouth sores, nosebleeds, postnasal drip, rhinorrhea, sinus pressure, sneezing, sore throat, tinnitus, trouble swallowing and voice change.   Eyes: Negative for photophobia, pain, discharge, redness, itching and visual disturbance.  Respiratory: Negative for apnea, cough, choking, chest tightness, shortness of breath, wheezing and stridor.   Cardiovascular: Negative for chest pain, palpitations and leg swelling.  Gastrointestinal: Negative for nausea, vomiting, abdominal pain, diarrhea, constipation and blood in stool.  Endocrine: Negative for cold intolerance, heat intolerance, polydipsia, polyphagia and polyuria.  Genitourinary: Negative for dysuria, urgency, frequency, hematuria, flank pain, decreased urine volume, discharge, penile swelling, scrotal swelling, enuresis, difficulty urinating, genital sores, penile pain and testicular pain.  Musculoskeletal: Negative for myalgias, back pain, joint swelling, arthralgias, gait problem, neck pain and neck stiffness.  Skin: Negative for color change, pallor, rash and wound.  Allergic/Immunologic: Negative for  environmental allergies, food allergies and immunocompromised state.  Neurological: Negative for dizziness, tremors, seizures, syncope, facial asymmetry, speech difficulty, weakness, light-headedness, numbness and headaches.  Hematological: Negative for adenopathy. Does not bruise/bleed easily.  Psychiatric/Behavioral: Negative for suicidal ideas, hallucinations, behavioral problems, confusion, sleep disturbance, self-injury, dysphoric mood, decreased concentration and agitation. The patient is not nervous/anxious and is not hyperactive.     Past Medical History  Diagnosis Date  . Hypertension     no meds  . Hyperlipemia   . Neuromuscular disorder (HCC)     numbness rt hand  . Angioedema   . Colon polyps      Previous polyps on colonoscopy.  [facility].    . 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  [facility].  . Arthritis   . Hiatal hernia 10/20/2013    EGD confirmed.  [facility].  . Cancer (HCC)     skin cancer multiple; followed by [facility]/dermatology yearly.  . Cataract     4 YEARS - BILATERAL   Past Surgical History  Procedure Laterality Date  . Bladder stone removal  2011  . Colonoscopy  10/20/2013    [facility].  Polyps.   Marland Kitchen Upper gastrointestinal endoscopy    . Quadriceps tendon repair  2003    bilateral  . Cervical fusion  1976  . Eye surgery  2012    both cataracts  . Carpal tunnel release  01/17/2012    Procedure: CARPAL TUNNEL RELEASE;  Surgeon: [name]., MD;  Location: [facility];  Service: Orthopedics;  Laterality: Right;  . Ulnar nerve transposition  01/17/2012    Procedure: ULNAR NERVE DECOMPRESSION/TRANSPOSITION;  Surgeon: [name]., MD;  Location: [facility];  Service: Orthopedics;  Laterality: Right;  decompression of ulnar nerve only  at right cubital tunnel  . Fracture surgery    .  Esophagogastroduodenoscopy  10/20/2013    [facility]; biopsy of area concernig for Barrett's; gastritis.  . Vasectomy     Allergies  Allergen  Reactions  . Ace Inhibitors Cough  . Amoxicillin Nausea And Vomiting  . Darvon     Chest pain  . Toprol Xl [Metoprolol Tartrate]    Current Outpatient Prescriptions  Medication Sig Dispense Refill  . Ascorbic Acid (VITAMIN C PO) Take 1 capsule by mouth daily.    Marland Kitchen atorvastatin (LIPITOR) 20 MG tablet Take 0.5 tablets (10 mg total) by mouth daily. 30 tablet 5  . cetirizine (ZYRTEC) 10 MG tablet Take 1 tablet (10 mg total) by mouth at bedtime. 30 tablet 11  . fish oil-omega-3 fatty acids 1000 MG capsule Take 1 g by mouth 2 (two) times daily.     . fluticasone (FLONASE) 50 MCG/ACT nasal spray Place 2 sprays into both nostrils daily. AS NEEDED 16 g 11  . GARLIC PO Take 034 mg by mouth 2 (two) times daily.     . Glucosamine HCl (GLUCOSAMINE PO) Take by mouth daily.    . Multiple Vitamin (MULTIVITAMIN) tablet Take 1 tablet by mouth daily.    . Naproxen Sodium (ALEVE PO) Take by mouth as needed.    . niacin 500 MG tablet Take 500 mg by mouth Nightly.     Marland Kitchen OVER THE COUNTER MEDICATION as needed.    Marland Kitchen PRESCRIPTION MEDICATION Policosanol 20 mg taking 1 daily at night time.    . saw palmetto 160 MG capsule Take 160 mg by mouth 2 (two) times daily.     Current Facility-Administered Medications  Medication Dose Route Frequency Provider Last Rate Last Dose  . loratadine (CLARITIN) tablet 10 mg  10 mg Oral Daily Jonita Albee, MD       Social History   Social History  . Marital Status: Married    Spouse Name: N/A  . Number of Children: 2  . Years of Education: N/A   Occupational History  . Retired    Social History Main Topics  . Smoking status: Former Smoker -- 2.00 packs/day for 15 years    Types: Cigarettes    Quit date: 01/11/1974  . Smokeless tobacco: Not on file  . Alcohol Use: No     Comment: occ  . Drug Use: No  . Sexual Activity: No   Other Topics Concern  . Not on file   Social History Narrative   Marital status:  Married x 41 years      Children: 2 daughters; 3  grandchildren.      Employment; retired in 1998 from [employer]; retired age 17 from [employer] after 14 years.      Lives: with wife.      Tobacco: former smoker; quit at age 69.  Smoked 15 years.      Alcohol:  On vacation or special occasions.        Education: [employer].       Exercise: Gym 3-4 times a week for 2 hours.; exercises 7 days per week.   Elliptical for one hour.      Advanced Directives: none; +desires FULL CODE.        ADLs: independent with ADLs; drives.  Does not walk with assistant devices.     Family History  Problem Relation Age of Onset  . Cirrhosis Father   . Hypertension Father   . Alcohol abuse Father   . Heart failure Mother     in 67s  .  Arthritis Brother     Rheumatoid arthritis  . Hypertension Brother        Objective:    BP 132/88 mmHg  Pulse 85  Temp(Src) 98 F (36.7 C) (Oral)  Resp 16  Ht 5\' 11"  (1.803 m)  Wt 224 lb 6.4 oz (101.787 kg)  BMI 31.31 kg/m2 Physical Exam  Constitutional: He is oriented to person, place, and time. He appears well-developed and well-nourished. No distress.  HENT:  Head: Normocephalic and atraumatic.  Right Ear: External ear normal.  Left Ear: External ear normal.  Nose: Nose normal.  Mouth/Throat: Oropharynx is clear and moist.  Eyes: Conjunctivae and EOM are normal. Pupils are equal, round, and reactive to light.  Neck: Normal range of motion. Neck supple. Carotid bruit is not present. No thyromegaly present.  Cardiovascular: Normal rate, regular rhythm, normal heart sounds and intact distal pulses.  Exam reveals no gallop and no friction rub.   No murmur heard. Pulmonary/Chest: Effort normal and breath sounds normal. He has no wheezes. He has no rales.  Abdominal: Soft. Bowel sounds are normal. He exhibits no distension and no mass. There is no tenderness. There is no rebound and no guarding.  Genitourinary: Rectum normal, prostate normal and penis normal.  Musculoskeletal:       Right  shoulder: Normal.       Left shoulder: Normal.       Cervical back: Normal.  Lymphadenopathy:    He has no cervical adenopathy.  Neurological: He is alert and oriented to person, place, and time. He has normal reflexes. No cranial nerve deficit. He exhibits normal muscle tone. Coordination normal.  Skin: Skin is warm and dry. No rash noted. He is not diaphoretic.  Psychiatric: He has a normal mood and affect. His behavior is normal. Judgment and thought content normal.        Assessment & Plan:   1. Encounter for Medicare annual wellness exam   2. Routine physical examination   3. Essential hypertension   4. Hyperlipidemia   5. Encounter for prostate cancer screening     Orders Placed This Encounter  Procedures  . CBC with Differential/Platelet  . Comprehensive metabolic panel    Order Specific Question:  Has the patient fasted?    Answer:  Yes  . Lipid panel    Order Specific Question:  Has the patient fasted?    Answer:  Yes  . PSA, Medicare  . POCT urinalysis dipstick   Meds ordered this encounter  Medications  . OVER THE COUNTER MEDICATION    Sig: as needed.  Marland Kitchen atorvastatin (LIPITOR) 20 MG tablet    Sig: Take 0.5 tablets (10 mg total) by mouth daily.    Dispense:  30 tablet    Refill:  5    Return in about 6 months (around 02/15/2016) for recheck high cholesterol.   Noelle Hoogland Paulita Fujita, M.D. [facility] [address] [place], [place]  [address] [phone] phone [phone] fax

## 2015-08-17 NOTE — Patient Instructions (Signed)

## 2015-08-18 LAB — PSA, MEDICARE: PSA: 0.89 ng/mL (ref <=4.00)

## 2015-11-25 ENCOUNTER — Ambulatory Visit (INDEPENDENT_AMBULATORY_CARE_PROVIDER_SITE_OTHER): Payer: Medicare Other | Admitting: Family Medicine

## 2015-11-25 VITALS — BP 128/80 | HR 86 | Temp 98.2°F | Resp 16 | Ht 72.0 in | Wt 229.0 lb

## 2015-11-25 DIAGNOSIS — R002 Palpitations: Secondary | ICD-10-CM

## 2015-11-25 DIAGNOSIS — I10 Essential (primary) hypertension: Secondary | ICD-10-CM

## 2015-11-25 LAB — POCT URINALYSIS DIP (MANUAL ENTRY)
BILIRUBIN UA: NEGATIVE
GLUCOSE UA: NEGATIVE
Ketones, POC UA: NEGATIVE
Leukocytes, UA: NEGATIVE
NITRITE UA: NEGATIVE
Protein Ur, POC: NEGATIVE
RBC UA: NEGATIVE
Spec Grav, UA: 1.02
Urobilinogen, UA: 0.2
pH, UA: 6.5

## 2015-11-25 MED ORDER — AMLODIPINE BESYLATE 2.5 MG PO TABS: 2.5000 mg | ORAL_TABLET | Freq: Every day | ORAL | Status: DC

## 2015-11-25 NOTE — Patient Instructions (Signed)
DASH Eating Plan  DASH stands for "Dietary Approaches to Stop Hypertension." The DASH eating plan is a healthy eating plan that has been shown to reduce high blood pressure (hypertension). Additional health benefits may include reducing the risk of type 2 diabetes mellitus, heart disease, and stroke. The DASH eating plan may also help with weight loss.  WHAT DO I NEED TO KNOW ABOUT THE DASH EATING PLAN?  For the DASH eating plan, you will follow these general guidelines:  · Choose foods with a percent daily value for sodium of less than 5% (as listed on the food label).  · Use salt-free seasonings or herbs instead of table salt or sea salt.  · Check with your health care provider or pharmacist before using salt substitutes.  · Eat lower-sodium products, often labeled as "lower sodium" or "no salt added."  · Eat fresh foods.  · Eat more vegetables, fruits, and low-fat dairy products.  · Choose whole grains. Look for the word "whole" as the first word in the ingredient list.  · Choose fish and skinless chicken or turkey more often than red meat. Limit fish, poultry, and meat to 6 oz (170 g) each day.  · Limit sweets, desserts, sugars, and sugary drinks.  · Choose heart-healthy fats.  · Limit cheese to 1 oz (28 g) per day.  · Eat more home-cooked food and less restaurant, buffet, and fast food.  · Limit fried foods.  · Cook foods using methods other than frying.  · Limit canned vegetables. If you do use them, rinse them well to decrease the sodium.  · When eating at a restaurant, ask that your food be prepared with less salt, or no salt if possible.  WHAT FOODS CAN I EAT?  Seek help from a dietitian for individual calorie needs.  Grains  Whole grain or whole wheat bread. Brown rice. Whole grain or whole wheat pasta. Quinoa, bulgur, and whole grain cereals. Low-sodium cereals. Corn or whole wheat flour tortillas. Whole grain cornbread. Whole grain crackers. Low-sodium crackers.  Vegetables  Fresh or frozen vegetables  (raw, steamed, roasted, or grilled). Low-sodium or reduced-sodium tomato and vegetable juices. Low-sodium or reduced-sodium tomato sauce and paste. Low-sodium or reduced-sodium canned vegetables.   Fruits  All fresh, canned (in natural juice), or frozen fruits.  Meat and Other Protein Products  Ground beef (85% or leaner), grass-fed beef, or beef trimmed of fat. Skinless chicken or turkey. Ground chicken or turkey. Pork trimmed of fat. All fish and seafood. Eggs. Dried beans, peas, or lentils. Unsalted nuts and seeds. Unsalted canned beans.  Dairy  Low-fat dairy products, such as skim or 1% milk, 2% or reduced-fat cheeses, low-fat ricotta or cottage cheese, or plain low-fat yogurt. Low-sodium or reduced-sodium cheeses.  Fats and Oils  Tub margarines without trans fats. Light or reduced-fat mayonnaise and salad dressings (reduced sodium). Avocado. Safflower, olive, or canola oils. Natural peanut or almond butter.  Other  Unsalted popcorn and pretzels.  The items listed above may not be a complete list of recommended foods or beverages. Contact your dietitian for more options.  WHAT FOODS ARE NOT RECOMMENDED?  Grains  White bread. White pasta. White rice. Refined cornbread. Bagels and croissants. Crackers that contain trans fat.  Vegetables  Creamed or fried vegetables. Vegetables in a cheese sauce. Regular canned vegetables. Regular canned tomato sauce and paste. Regular tomato and vegetable juices.  Fruits  Dried fruits. Canned fruit in light or heavy syrup. Fruit juice.  Meat and Other Protein   Products  Fatty cuts of meat. Ribs, chicken wings, bacon, sausage, bologna, salami, chitterlings, fatback, hot dogs, bratwurst, and packaged luncheon meats. Salted nuts and seeds. Canned beans with salt.  Dairy  Whole or 2% milk, cream, half-and-half, and cream cheese. Whole-fat or sweetened yogurt. Full-fat cheeses or blue cheese. Nondairy creamers and whipped toppings. Processed cheese, cheese spreads, or cheese  curds.  Condiments  Onion and garlic salt, seasoned salt, table salt, and sea salt. Canned and packaged gravies. Worcestershire sauce. Tartar sauce. Barbecue sauce. Teriyaki sauce. Soy sauce, including reduced sodium. Steak sauce. Fish sauce. Oyster sauce. Cocktail sauce. Horseradish. Ketchup and mustard. Meat flavorings and tenderizers. Bouillon cubes. Hot sauce. Tabasco sauce. Marinades. Taco seasonings. Relishes.  Fats and Oils  Butter, stick margarine, lard, shortening, ghee, and bacon fat. Coconut, palm kernel, or palm oils. Regular salad dressings.  Other  Pickles and olives. Salted popcorn and pretzels.  The items listed above may not be a complete list of foods and beverages to avoid. Contact your dietitian for more information.  WHERE CAN I FIND MORE INFORMATION?  National Heart, Lung, and Blood Institute: www.nhlbi.nih.gov/health/health-topics/topics/dash/     This information is not intended to replace advice given to you by your health care provider. Make sure you discuss any questions you have with your health care provider.     Document Released: 10/13/2011 Document Revised: 11/14/2014 Document Reviewed: 08/28/2013  Elsevier Interactive Patient Education ©2016 Elsevier Inc.

## 2015-11-25 NOTE — Progress Notes (Signed)
Subjective:    Patient ID: Todd [name], male    DOB: 1941-09-02, 76 y.o.   MRN: [id number]  11/25/2015  Hypertension   HPI This 75 y.o. male presents for evaluation for high blood pressure.  Morning readings are 134/102, 139/101, 127/87.  Still exercising; watching diet.  No stressors. Weight up five pounds with holidays.   Previous medicatoin and developed cough; then Guest started 20mg  of something; caused dizziness.  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  Still taking zyrtec daily to prevent angioedema. Did not tolerate Metoprolol.  Gets up every hour to urinate. Denies HA/dizziness/blurred vision/chest pain/SOB/leg swelling. +intermittent palpitations.   Sinus congestion:  Much better.  Review of Systems  Constitutional: Negative for fever, chills, diaphoresis, activity change, appetite change and fatigue.  HENT: Positive for congestion.   Respiratory: Negative for cough and shortness of breath.   Cardiovascular: Positive for palpitations. Negative for chest pain and leg swelling.  Gastrointestinal: Negative for nausea, vomiting, abdominal pain and diarrhea.  Endocrine: Negative for cold intolerance, heat intolerance, polydipsia, polyphagia and polyuria.  Skin: Negative for color change, rash and wound.  Neurological: Negative for dizziness, tremors, seizures, syncope, facial asymmetry, speech difficulty, weakness, light-headedness, numbness and headaches.  Psychiatric/Behavioral: Negative for sleep disturbance and dysphoric mood. The patient is not nervous/anxious.     Past Medical History  Diagnosis Date  . Hypertension     no meds  . Hyperlipemia   . Neuromuscular disorder (HCC)     numbness rt hand  . Angioedema   . Colon polyps      Previous polyps on colonoscopy.  [facility].    . 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  [facility].  . Arthritis   . Hiatal hernia 10/20/2013    EGD confirmed.  [facility].  . Cancer (HCC)     skin cancer multiple; followed by  [facility]/dermatology yearly.  . Cataract     4 YEARS - BILATERAL   Past Surgical History  Procedure Laterality Date  . Bladder stone removal  2011  . Colonoscopy  10/20/2013    [facility].  Polyps.   Marland Kitchen Upper gastrointestinal endoscopy    . Quadriceps tendon repair  2003    bilateral  . Cervical fusion  1976  . Eye surgery  2012    both cataracts  . Carpal tunnel release  01/17/2012    Procedure: CARPAL TUNNEL RELEASE;  Surgeon: [name]., MD;  Location: [facility];  Service: Orthopedics;  Laterality: Right;  . Ulnar nerve transposition  01/17/2012    Procedure: ULNAR NERVE DECOMPRESSION/TRANSPOSITION;  Surgeon: [name]., MD;  Location: [facility];  Service: Orthopedics;  Laterality: Right;  decompression of ulnar nerve only  at right cubital tunnel  . Fracture surgery    . Esophagogastroduodenoscopy  10/20/2013    [facility]; biopsy of area concernig for Barrett's; gastritis.  . Vasectomy     Allergies  Allergen Reactions  . Ace Inhibitors Cough  . Amoxicillin Nausea And Vomiting  . Darvon     Chest pain  . Toprol Xl [Metoprolol Tartrate]    Current Outpatient Prescriptions  Medication Sig Dispense Refill  . Ascorbic Acid (VITAMIN C PO) Take 1 capsule by mouth daily.    Marland Kitchen atorvastatin (LIPITOR) 20 MG tablet Take 0.5 tablets (10 mg total) by mouth daily. 30 tablet 5  . cetirizine (ZYRTEC) 10 MG tablet Take 1 tablet (10 mg total) by mouth at bedtime. 30 tablet 11  .  fish oil-omega-3 fatty acids 1000 MG capsule Take 1 g by mouth 2 (two) times daily.     . fluticasone (FLONASE) 50 MCG/ACT nasal spray Place 2 sprays into both nostrils daily. AS NEEDED 16 g 11  . GARLIC PO Take 324 mg by mouth 2 (two) times daily.     . Glucosamine HCl (GLUCOSAMINE PO) Take by mouth daily.    . Multiple Vitamin (MULTIVITAMIN) tablet Take 1 tablet by mouth daily.    . Naproxen Sodium (ALEVE PO) Take by mouth as needed.    . niacin 500 MG tablet Take 500 mg by mouth  Nightly.     Marland Kitchen OVER THE COUNTER MEDICATION as needed.    Marland Kitchen PRESCRIPTION MEDICATION Policosanol 20 mg taking 1 daily at night time.    . saw palmetto 160 MG capsule Take 160 mg by mouth 2 (two) times daily.    Marland Kitchen amLODipine (NORVASC) 2.5 MG tablet Take 1 tablet (2.5 mg total) by mouth daily. 30 tablet 5   Current Facility-Administered Medications  Medication Dose Route Frequency Provider Last Rate Last Dose  . loratadine (CLARITIN) tablet 10 mg  10 mg Oral Daily Jonita Albee, MD       Social History   Social History  . Marital Status: Married    Spouse Name: N/A  . Number of Children: 2  . Years of Education: N/A   Occupational History  . Retired    Social History Main Topics  . Smoking status: Former Smoker -- 2.00 packs/day for 15 years    Types: Cigarettes    Quit date: 01/11/1974  . Smokeless tobacco: Not on file  . Alcohol Use: No     Comment: occ  . Drug Use: No  . Sexual Activity: No   Other Topics Concern  . Not on file   Social History Narrative   Marital status:  Married x 41 years      Children: 2 daughters; 3 grandchildren.      Employment; retired in 1998 from [employer]; retired age 20 from [employer] after 14 years.      Lives: with wife.      Tobacco: former smoker; quit at age 32.  Smoked 15 years.      Alcohol:  On vacation or special occasions.        Education: [employer].       Exercise: Gym 3-4 times a week for 2 hours.; exercises 7 days per week.   Elliptical for one hour.      Advanced Directives: none; +desires FULL CODE.        ADLs: independent with ADLs; drives.  Does not walk with assistant devices.     Family History  Problem Relation Age of Onset  . Cirrhosis Father   . Hypertension Father   . Alcohol abuse Father   . Heart failure Mother     in 33s  . Arthritis Brother     Rheumatoid arthritis  . Hypertension Brother        Objective:    BP 128/80 mmHg  Pulse 86  Temp(Src) 98.2 F (36.8 C)  Resp 16   Ht 6' (1.829 m)  Wt 229 lb (103.874 kg)  BMI 31.05 kg/m2  SpO2 94% Physical Exam  Constitutional: He is oriented to person, place, and time. He appears well-developed and well-nourished. No distress.  HENT:  Head: Normocephalic and atraumatic.  Right Ear: External ear normal.  Left Ear: External ear normal.  Nose: Nose  normal.  Mouth/Throat: Oropharynx is clear and moist.  Eyes: Conjunctivae and EOM are normal. Pupils are equal, round, and reactive to light.  Neck: Normal range of motion. Neck supple. Carotid bruit is not present. No thyromegaly present.  Cardiovascular: Normal rate, regular rhythm, normal heart sounds and intact distal pulses.  Exam reveals no gallop and no friction rub.   No murmur heard. Pulmonary/Chest: Effort normal and breath sounds normal. He has no wheezes. He has no rales.  Abdominal: Soft. Bowel sounds are normal. He exhibits no distension and no mass. There is no tenderness. There is no rebound and no guarding.  Lymphadenopathy:    He has no cervical adenopathy.  Neurological: He is alert and oriented to person, place, and time. No cranial nerve deficit.  Skin: Skin is warm and dry. No rash noted. He is not diaphoretic.  Psychiatric: He has a normal mood and affect. His behavior is normal.  Nursing note and vitals reviewed.  Results for orders placed or performed in visit on 08/17/15  CBC with Differential/Platelet  Result Value Ref Range   WBC 5.3 4.0 - 10.5 K/uL   RBC 5.52 4.22 - 5.81 MIL/uL   Hemoglobin 16.4 13.0 - 17.0 g/dL   HCT 40.9 81.1 - 91.4 %   MCV 84.1 78.0 - 100.0 fL   MCH 29.7 26.0 - 34.0 pg   MCHC 35.3 30.0 - 36.0 g/dL   RDW 78.2 95.6 - 21.3 %   Platelets 245 150 - 400 K/uL   MPV 10.0 8.6 - 12.4 fL   Neutrophils Relative % 58 43 - 77 %   Neutro Abs 3.1 1.7 - 7.7 K/uL   Lymphocytes Relative 25 12 - 46 %   Lymphs Abs 1.3 0.7 - 4.0 K/uL   Monocytes Relative 13 (H) 3 - 12 %   Monocytes Absolute 0.7 0.1 - 1.0 K/uL   Eosinophils Relative  3 0 - 5 %   Eosinophils Absolute 0.2 0.0 - 0.7 K/uL   Basophils Relative 1 0 - 1 %   Basophils Absolute 0.1 0.0 - 0.1 K/uL   Smear Review Criteria for review not met   Comprehensive metabolic panel  Result Value Ref Range   Sodium 137 135 - 146 mmol/L   Potassium 4.1 3.5 - 5.3 mmol/L   Chloride 100 98 - 110 mmol/L   CO2 25 20 - 31 mmol/L   Glucose, Bld 103 (H) 65 - 99 mg/dL   BUN 16 7 - 25 mg/dL   Creat 0.86 5.78 - 4.69 mg/dL   Total Bilirubin 1.1 0.2 - 1.2 mg/dL   Alkaline Phosphatase 81 40 - 115 U/L   AST 27 10 - 35 U/L   ALT 32 9 - 46 U/L   Total Protein 7.2 6.1 - 8.1 g/dL   Albumin 4.7 3.6 - 5.1 g/dL   Calcium 9.3 8.6 - 62.9 mg/dL  Lipid panel  Result Value Ref Range   Cholesterol 166 125 - 200 mg/dL   Triglycerides 528 (H) <150 mg/dL   HDL 38 (L) >=41 mg/dL   Total CHOL/HDL Ratio 4.4 <=5.0 Ratio   VLDL 43 (H) <30 mg/dL   LDL Cholesterol 85 <324 mg/dL  PSA, Medicare  Result Value Ref Range   PSA 0.89 <=4.00 ng/mL  POCT urinalysis dipstick  Result Value Ref Range   Color, UA yellow yellow   Clarity, UA clear clear   Glucose, UA negative negative   Bilirubin, UA negative negative   Ketones, POC UA negative negative  Spec Grav, UA 1.015    Blood, UA negative negative   pH, UA 7.0    Protein Ur, POC negative negative   Urobilinogen, UA 0.2    Nitrite, UA Negative Negative   Leukocytes, UA Negative Negative       Assessment & Plan:   1. Essential hypertension, benign   2. Palpitations     Orders Placed This Encounter  Procedures  . CBC with Differential/Platelet  . Comprehensive metabolic panel  . POCT urinalysis dipstick  . EKG 12-Lead   Meds ordered this encounter  Medications  . amLODipine (NORVASC) 2.5 MG tablet    Sig: Take 1 tablet (2.5 mg total) by mouth daily.    Dispense:  30 tablet    Refill:  5    No Follow-up on file.    Kayton Ripp Paulita Fujita, M.D. [facility] [address] [place], [place]   [address] [phone] phone [phone] fax

## 2015-11-26 LAB — CBC WITH DIFFERENTIAL/PLATELET
Basophils Absolute: 0.1 10*3/uL (ref 0.0–0.1)
Basophils Relative: 1 % (ref 0–1)
Eosinophils Absolute: 0.2 10*3/uL (ref 0.0–0.7)
Eosinophils Relative: 3 % (ref 0–5)
HEMATOCRIT: 45.5 % (ref 39.0–52.0)
HEMOGLOBIN: 15.7 g/dL (ref 13.0–17.0)
LYMPHS ABS: 1.4 10*3/uL (ref 0.7–4.0)
LYMPHS PCT: 23 % (ref 12–46)
MCH: 28.9 pg (ref 26.0–34.0)
MCHC: 34.5 g/dL (ref 30.0–36.0)
MCV: 83.8 fL (ref 78.0–100.0)
MONOS PCT: 12 % (ref 3–12)
MPV: 10.4 fL (ref 8.6–12.4)
Monocytes Absolute: 0.8 10*3/uL (ref 0.1–1.0)
NEUTROS ABS: 3.8 10*3/uL (ref 1.7–7.7)
NEUTROS PCT: 61 % (ref 43–77)
Platelets: 274 10*3/uL (ref 150–400)
RBC: 5.43 MIL/uL (ref 4.22–5.81)
RDW: 13.7 % (ref 11.5–15.5)
WBC: 6.3 10*3/uL (ref 4.0–10.5)

## 2015-11-26 LAB — COMPREHENSIVE METABOLIC PANEL
ALBUMIN: 4.6 g/dL (ref 3.6–5.1)
ALT: 32 U/L (ref 9–46)
AST: 30 U/L (ref 10–35)
Alkaline Phosphatase: 80 U/L (ref 40–115)
BUN: 16 mg/dL (ref 7–25)
CHLORIDE: 103 mmol/L (ref 98–110)
CO2: 28 mmol/L (ref 20–31)
CREATININE: 1.05 mg/dL (ref 0.70–1.18)
Calcium: 9.5 mg/dL (ref 8.6–10.3)
Glucose, Bld: 100 mg/dL — ABNORMAL HIGH (ref 65–99)
POTASSIUM: 4 mmol/L (ref 3.5–5.3)
Sodium: 136 mmol/L (ref 135–146)
TOTAL PROTEIN: 7.2 g/dL (ref 6.1–8.1)
Total Bilirubin: 0.7 mg/dL (ref 0.2–1.2)

## 2015-11-30 ENCOUNTER — Encounter: Payer: Self-pay | Admitting: Family Medicine

## 2015-12-01 ENCOUNTER — Encounter: Payer: Self-pay | Admitting: Family Medicine

## 2015-12-10 ENCOUNTER — Other Ambulatory Visit: Payer: Self-pay | Admitting: Family Medicine

## 2015-12-11 MED ORDER — AMLODIPINE BESYLATE 5 MG PO TABS: 5.0000 mg | ORAL_TABLET | Freq: Every day | ORAL | Status: DC

## 2016-02-15 ENCOUNTER — Ambulatory Visit: Payer: Medicare Other | Admitting: Family Medicine

## 2016-03-01 ENCOUNTER — Ambulatory Visit (INDEPENDENT_AMBULATORY_CARE_PROVIDER_SITE_OTHER): Payer: Medicare Other | Admitting: Family Medicine

## 2016-03-01 ENCOUNTER — Encounter: Payer: Self-pay | Admitting: Family Medicine

## 2016-03-01 VITALS — BP 132/80 | HR 83 | Temp 97.7°F | Resp 16 | Ht 70.5 in | Wt 224.4 lb

## 2016-03-01 DIAGNOSIS — E669 Obesity, unspecified: Secondary | ICD-10-CM | POA: Insufficient documentation

## 2016-03-01 DIAGNOSIS — I10 Essential (primary) hypertension: Secondary | ICD-10-CM | POA: Diagnosis not present

## 2016-03-01 DIAGNOSIS — M19041 Primary osteoarthritis, right hand: Secondary | ICD-10-CM | POA: Diagnosis not present

## 2016-03-01 DIAGNOSIS — Z6831 Body mass index (BMI) 31.0-31.9, adult: Secondary | ICD-10-CM | POA: Diagnosis not present

## 2016-03-01 DIAGNOSIS — M19042 Primary osteoarthritis, left hand: Secondary | ICD-10-CM | POA: Diagnosis not present

## 2016-03-01 DIAGNOSIS — E7800 Pure hypercholesterolemia, unspecified: Secondary | ICD-10-CM

## 2016-03-01 DIAGNOSIS — J301 Allergic rhinitis due to pollen: Secondary | ICD-10-CM

## 2016-03-01 DIAGNOSIS — E782 Mixed hyperlipidemia: Secondary | ICD-10-CM | POA: Insufficient documentation

## 2016-03-01 DIAGNOSIS — H6982 Other specified disorders of Eustachian tube, left ear: Secondary | ICD-10-CM | POA: Diagnosis not present

## 2016-03-01 LAB — COMPREHENSIVE METABOLIC PANEL
ALK PHOS: 80 U/L (ref 40–115)
ALT: 33 U/L (ref 9–46)
AST: 25 U/L (ref 10–35)
Albumin: 4.3 g/dL (ref 3.6–5.1)
BILIRUBIN TOTAL: 0.9 mg/dL (ref 0.2–1.2)
BUN: 16 mg/dL (ref 7–25)
CO2: 25 mmol/L (ref 20–31)
Calcium: 9.1 mg/dL (ref 8.6–10.3)
Chloride: 103 mmol/L (ref 98–110)
Creat: 0.88 mg/dL (ref 0.70–1.18)
GLUCOSE: 103 mg/dL — AB (ref 65–99)
POTASSIUM: 4.2 mmol/L (ref 3.5–5.3)
Sodium: 139 mmol/L (ref 135–146)
TOTAL PROTEIN: 7.1 g/dL (ref 6.1–8.1)

## 2016-03-01 LAB — LIPID PANEL
CHOLESTEROL: 152 mg/dL (ref 125–200)
HDL: 40 mg/dL (ref >=40)
LDL Cholesterol: 79 mg/dL (ref <130)
Total CHOL/HDL Ratio: 3.8 Ratio (ref <=5.0)
Triglycerides: 165 mg/dL — ABNORMAL HIGH (ref <150)
VLDL: 33 mg/dL — AB (ref <30)

## 2016-03-01 NOTE — Progress Notes (Signed)
Subjective:    Patient ID: Todd [name], male    DOB: 1941/04/04, 75 y.o.   MRN: [id number]  03/01/2016  Follow-up and Hyperlipidemia   HPI This 75 y.o. male presents for six month follow-up of the following:  1. Hyperlipidemia: Patient reports good compliance with medication, good tolerance to medication, and good symptom control.    2. HTN: Patient reports good compliance with medication, good tolerance to medication, and good symptom control.  Home BPs running 110-138/70-85.  No side effects.    3.  Allergic Rhinitis: doing well with "my personal mister".  Using Flonase and Zyrtec.  Using mister twice daily.  No headaches.  If cuts grass, uses mister tid.    4. OA hands: takes Aleve qhs.  Some stiffness.    5. Urinary screening: NO URINARY INCONTINENCE/LEAKAGE.   Review of Systems  Constitutional: Negative for fever, chills, diaphoresis, activity change, appetite change and fatigue.  HENT: Positive for ear pain and sneezing. Negative for congestion, postnasal drip, rhinorrhea, sinus pressure, sore throat, trouble swallowing and voice change.   Respiratory: Negative for cough and shortness of breath.   Cardiovascular: Negative for chest pain, palpitations and leg swelling.  Gastrointestinal: Negative for nausea, vomiting, abdominal pain and diarrhea.  Endocrine: Negative for cold intolerance, heat intolerance, polydipsia, polyphagia and polyuria.  Musculoskeletal: Positive for arthralgias.  Skin: Negative for color change, rash and wound.  Neurological: Negative for dizziness, tremors, seizures, syncope, facial asymmetry, speech difficulty, weakness, light-headedness, numbness and headaches.  Psychiatric/Behavioral: Negative for sleep disturbance and dysphoric mood. The patient is not nervous/anxious.     Past Medical History  Diagnosis Date  . Hypertension     no meds  . Hyperlipemia   . Neuromuscular disorder (HCC)     numbness rt hand  . Angioedema   . Colon polyps        Previous polyps on colonoscopy.  [facility].    . 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  [facility].  . Arthritis   . Hiatal hernia 10/20/2013    EGD confirmed.  [facility].  . Cancer (HCC)     skin cancer multiple; followed by [facility] yearly.  . Cataract     4 YEARS - BILATERAL   Past Surgical History  Procedure Laterality Date  . Bladder stone removal  2011  . Colonoscopy  10/20/2013    [facility].  Polyps.   Marland Kitchen Upper gastrointestinal endoscopy    . Quadriceps tendon repair  2003    bilateral  . Cervical fusion  1976  . Eye surgery  2012    both cataracts  . Carpal tunnel release  01/17/2012    Procedure: CARPAL TUNNEL RELEASE;  Surgeon: [name]., MD;  Location: [facility];  Service: Orthopedics;  Laterality: Right;  . Ulnar nerve transposition  01/17/2012    Procedure: ULNAR NERVE DECOMPRESSION/TRANSPOSITION;  Surgeon: [name]., MD;  Location: [facility];  Service: Orthopedics;  Laterality: Right;  decompression of ulnar nerve only  at right cubital tunnel  . Fracture surgery    . Esophagogastroduodenoscopy  10/20/2013    [facility]; biopsy of area concernig for Barrett's; gastritis.  . Vasectomy     Allergies  Allergen Reactions  . Ace Inhibitors Cough  . Amoxicillin Nausea And Vomiting  . Darvon     Chest pain  . Toprol Xl [Metoprolol Tartrate]    Current Outpatient Prescriptions  Medication Sig Dispense Refill  . amLODipine (NORVASC) 5  MG tablet Take 1 tablet (5 mg total) by mouth daily. 30 tablet 5  . Ascorbic Acid (VITAMIN C PO) Take 1 capsule by mouth daily.    Marland Kitchen atorvastatin (LIPITOR) 20 MG tablet Take 0.5 tablets (10 mg total) by mouth daily. 30 tablet 5  . cetirizine (ZYRTEC) 10 MG tablet Take 1 tablet (10 mg total) by mouth at bedtime. 30 tablet 11  . fish oil-omega-3 fatty acids 1000 MG capsule Take 1 g by mouth 2 (two) times daily.     Marland Kitchen GARLIC PO Take 621 mg by mouth 2 (two) times daily.     . Glucosamine HCl  (GLUCOSAMINE PO) Take by mouth daily.    . Multiple Vitamin (MULTIVITAMIN) tablet Take 1 tablet by mouth daily.    . Naproxen Sodium (ALEVE PO) Take by mouth as needed.    Marland Kitchen OVER THE COUNTER MEDICATION as needed.    Marland Kitchen PRESCRIPTION MEDICATION Policosanol 20 mg taking 1 daily at night time.    . saw palmetto 160 MG capsule Take 160 mg by mouth 2 (two) times daily.    . fluticasone (FLONASE) 50 MCG/ACT nasal spray Place 2 sprays into both nostrils daily. AS NEEDED (Patient not taking: Reported on 03/01/2016) 16 g 11  . niacin 500 MG tablet Take 500 mg by mouth Nightly. Reported on 03/01/2016     Current Facility-Administered Medications  Medication Dose Route Frequency Provider Last Rate Last Dose  . loratadine (CLARITIN) tablet 10 mg  10 mg Oral Daily Jonita Albee, MD       Social History   Social History  . Marital Status: Married    Spouse Name: N/A  . Number of Children: 2  . Years of Education: N/A   Occupational History  . Retired    Social History Main Topics  . Smoking status: Former Smoker -- 2.00 packs/day for 15 years    Types: Cigarettes    Quit date: 01/11/1974  . Smokeless tobacco: Not on file  . Alcohol Use: No     Comment: occ  . Drug Use: No  . Sexual Activity: No   Other Topics Concern  . Not on file   Social History Narrative   Marital status:  Married x 41 years      Children: 2 daughters; 3 grandchildren.      Employment; retired in 1998 from [employer]; retired age 70 from [employer] after 14 years.      Lives: with wife.      Tobacco: former smoker; quit at age 46.  Smoked 15 years.      Alcohol:  On vacation or special occasions.        Education: [employer].       Exercise: Gym 3-4 times a week for 2 hours.; exercises 7 days per week.   Elliptical for one hour.      Advanced Directives: none; +desires FULL CODE.        ADLs: independent with ADLs; drives.  Does not walk with assistant devices.     Family History  Problem  Relation Age of Onset  . Cirrhosis Father   . Hypertension Father   . Alcohol abuse Father   . Heart failure Mother     in 27s  . Arthritis Brother     Rheumatoid arthritis  . Hypertension Brother        Objective:    BP 132/80 mmHg  Pulse 83  Temp(Src) 97.7 F (36.5 C) (Oral)  Resp  16  Ht 5' 10.5" (1.791 m)  Wt 224 lb 6.4 oz (101.787 kg)  BMI 31.73 kg/m2  SpO2 97% Physical Exam  Constitutional: He is oriented to person, place, and time. He appears well-developed and well-nourished. No distress.  HENT:  Head: Normocephalic and atraumatic.  Right Ear: Tympanic membrane, external ear and ear canal normal. Tympanic membrane is not retracted.  Left Ear: External ear normal. Tympanic membrane is retracted.  Nose: Nose normal.  Mouth/Throat: Oropharynx is clear and moist.  Eyes: Conjunctivae and EOM are normal. Pupils are equal, round, and reactive to light.  Neck: Normal range of motion. Neck supple. Carotid bruit is not present. No thyromegaly present.  Cardiovascular: Normal rate, regular rhythm, normal heart sounds and intact distal pulses.  Exam reveals no gallop and no friction rub.   No murmur heard. Pulmonary/Chest: Effort normal and breath sounds normal. He has no wheezes. He has no rales.  Abdominal: Soft. Bowel sounds are normal. He exhibits no distension and no mass. There is no tenderness. There is no rebound and no guarding.  Lymphadenopathy:    He has no cervical adenopathy.  Neurological: He is alert and oriented to person, place, and time. No cranial nerve deficit.  Skin: Skin is warm and dry. No rash noted. He is not diaphoretic.  Psychiatric: He has a normal mood and affect. His behavior is normal.  Nursing note and vitals reviewed.  Results for orders placed or performed in visit on 11/25/15  CBC with Differential/Platelet  Result Value Ref Range   WBC 6.3 4.0 - 10.5 K/uL   RBC 5.43 4.22 - 5.81 MIL/uL   Hemoglobin 15.7 13.0 - 17.0 g/dL   HCT 27.0 35.0 -  09.3 %   MCV 83.8 78.0 - 100.0 fL   MCH 28.9 26.0 - 34.0 pg   MCHC 34.5 30.0 - 36.0 g/dL   RDW 81.8 29.9 - 37.1 %   Platelets 274 150 - 400 K/uL   MPV 10.4 8.6 - 12.4 fL   Neutrophils Relative % 61 43 - 77 %   Neutro Abs 3.8 1.7 - 7.7 K/uL   Lymphocytes Relative 23 12 - 46 %   Lymphs Abs 1.4 0.7 - 4.0 K/uL   Monocytes Relative 12 3 - 12 %   Monocytes Absolute 0.8 0.1 - 1.0 K/uL   Eosinophils Relative 3 0 - 5 %   Eosinophils Absolute 0.2 0.0 - 0.7 K/uL   Basophils Relative 1 0 - 1 %   Basophils Absolute 0.1 0.0 - 0.1 K/uL   Smear Review Criteria for review not met   Comprehensive metabolic panel  Result Value Ref Range   Sodium 136 135 - 146 mmol/L   Potassium 4.0 3.5 - 5.3 mmol/L   Chloride 103 98 - 110 mmol/L   CO2 28 20 - 31 mmol/L   Glucose, Bld 100 (H) 65 - 99 mg/dL   BUN 16 7 - 25 mg/dL   Creat 6.96 7.89 - 3.81 mg/dL   Total Bilirubin 0.7 0.2 - 1.2 mg/dL   Alkaline Phosphatase 80 40 - 115 U/L   AST 30 10 - 35 U/L   ALT 32 9 - 46 U/L   Total Protein 7.2 6.1 - 8.1 g/dL   Albumin 4.6 3.6 - 5.1 g/dL   Calcium 9.5 8.6 - 01.7 mg/dL  POCT urinalysis dipstick  Result Value Ref Range   Color, UA yellow yellow   Clarity, UA clear clear   Glucose, UA negative negative   Bilirubin,  UA negative negative   Ketones, POC UA negative negative   Spec Grav, UA 1.020    Blood, UA negative negative   pH, UA 6.5    Protein Ur, POC negative negative   Urobilinogen, UA 0.2    Nitrite, UA Negative Negative   Leukocytes, UA Negative Negative   Functional Status Survey: Is the patient deaf or have difficulty hearing?: No Does the patient have difficulty seeing, even when wearing glasses/contacts?: No Does the patient have difficulty concentrating, remembering, or making decisions?: No Does the patient have difficulty walking or climbing stairs?: No Does the patient have difficulty dressing or bathing?: No Does the patient have difficulty doing errands alone such as visiting a  doctor's office or shopping?: No  Depression screen [facility] 2/9 03/01/2016 11/25/2015 08/17/2015 07/07/2015 04/28/2015  Decreased Interest 0 0 0 0 0  Down, Depressed, Hopeless 0 0 0 0 0  PHQ - 2 Score 0 0 0 0 0   Fall Risk  03/01/2016 08/17/2015 03/09/2015 08/11/2014  Falls in the past year? No No Yes No  Number falls in past yr: - - 1 -  Injury with Fall? - - Yes -  Current Exercise Habits: Home exercise routine, Type of exercise: treadmill;strength training/weights, Time (Minutes): 35, Frequency (Times/Week): 2, Weekly Exercise (Minutes/Week): 70, Intensity: Moderate Exercise limited by: orthopedic condition(s)      Assessment & Plan:   1. Pure hypercholesterolemia   2. Essential hypertension, benign   3. Seasonal allergic rhinitis due to pollen   4. Primary osteoarthritis of both hands   5. Obesity   6. BMI 31.0-31.9,adult   7. Eustachian tube dysfunction, left    -Add Flonase to daily regimen to help with L eustachian tube dysfunction; continue Zyrtec. -obtain labs. -continue current medications. -recommend exercise, low-calorie low-fat food choices for goal of ongoing weight loss.  -continue one Aleve qhs for OA hands.   Orders Placed This Encounter  Procedures  . Comprehensive metabolic panel    Order Specific Question:  Has the patient fasted?    Answer:  Yes  . Lipid panel    Order Specific Question:  Has the patient fasted?    Answer:  Yes   No orders of the defined types were placed in this encounter.    Return in about 6 months (around 08/31/2016) for complete physical examiniation.    Twyla Dais Paulita Fujita, M.D. [facility] [address] [place], [place]  [address] [phone] phone [phone] fax

## 2016-03-01 NOTE — Patient Instructions (Addendum)
   IF you received an x-ray today, you will receive an invoice from [facility]. Please contact [facility] at [phone] with questions or concerns regarding your invoice.   IF you received labwork today, you will receive an invoice from [facility]. Please contact [facility] at [phone] with questions or concerns regarding your invoice.   Our billing staff will not be able to assist you with questions regarding bills from these companies.  You will be contacted with the lab results as soon as they are available. The fastest way to get your results is to activate your My Chart account. Instructions are located on the last page of this paperwork. If you have not heard from us regarding the results in 2 weeks, please contact this office.     DASH Eating Plan DASH stands for "Dietary Approaches to Stop Hypertension." The DASH eating plan is a healthy eating plan that has been shown to reduce high blood pressure (hypertension). Additional health benefits may include reducing the risk of type 2 diabetes mellitus, heart disease, and stroke. The DASH eating plan may also help with weight loss. WHAT DO I NEED TO KNOW ABOUT THE DASH EATING PLAN? For the DASH eating plan, you will follow these general guidelines:  Choose foods with a percent daily value for sodium of less than 5% (as listed on the food label).  Use salt-free seasonings or herbs instead of table salt or sea salt.  Check with your health care provider or pharmacist before using salt substitutes.  Eat lower-sodium products, often labeled as "lower sodium" or "no salt added."  Eat fresh foods.  Eat more vegetables, fruits, and low-fat dairy products.  Choose whole grains. Look for the word "whole" as the first word in the ingredient list.  Choose fish and skinless chicken or turkey more often than red meat. Limit fish, poultry, and meat to 6 oz (170 g) each day.  Limit sweets,  desserts, sugars, and sugary drinks.  Choose heart-healthy fats.  Limit cheese to 1 oz (28 g) per day.  Eat more home-cooked food and less restaurant, buffet, and fast food.  Limit fried foods.  Cook foods using methods other than frying.  Limit canned vegetables. If you do use them, rinse them well to decrease the sodium.  When eating at a restaurant, ask that your food be prepared with less salt, or no salt if possible. WHAT FOODS CAN I EAT? Seek help from a dietitian for individual calorie needs. Grains Whole grain or whole wheat bread. Brown rice. Whole grain or whole wheat pasta. Quinoa, bulgur, and whole grain cereals. Low-sodium cereals. Corn or whole wheat flour tortillas. Whole grain cornbread. Whole grain crackers. Low-sodium crackers. Vegetables Fresh or frozen vegetables (raw, steamed, roasted, or grilled). Low-sodium or reduced-sodium tomato and vegetable juices. Low-sodium or reduced-sodium tomato sauce and paste. Low-sodium or reduced-sodium canned vegetables.  Fruits All fresh, canned (in natural juice), or frozen fruits. Meat and Other Protein Products Ground beef (85% or leaner), grass-fed beef, or beef trimmed of fat. Skinless chicken or turkey. Ground chicken or turkey. Pork trimmed of fat. All fish and seafood. Eggs. Dried beans, peas, or lentils. Unsalted nuts and seeds. Unsalted canned beans. Dairy Low-fat dairy products, such as skim or 1% milk, 2% or reduced-fat cheeses, low-fat ricotta or cottage cheese, or plain low-fat yogurt. Low-sodium or reduced-sodium cheeses. Fats and Oils Tub margarines without trans fats. Light or reduced-fat mayonnaise and salad dressings (reduced sodium). Avocado. Safflower, olive, or canola   oils. Natural peanut or almond butter. Other Unsalted popcorn and pretzels. The items listed above may not be a complete list of recommended foods or beverages. Contact your dietitian for more options. WHAT FOODS ARE NOT  RECOMMENDED? Grains White bread. White pasta. White rice. Refined cornbread. Bagels and croissants. Crackers that contain trans fat. Vegetables Creamed or fried vegetables. Vegetables in a cheese sauce. Regular canned vegetables. Regular canned tomato sauce and paste. Regular tomato and vegetable juices. Fruits Dried fruits. Canned fruit in light or heavy syrup. Fruit juice. Meat and Other Protein Products Fatty cuts of meat. Ribs, chicken wings, bacon, sausage, bologna, salami, chitterlings, fatback, hot dogs, bratwurst, and packaged luncheon meats. Salted nuts and seeds. Canned beans with salt. Dairy Whole or 2% milk, cream, half-and-half, and cream cheese. Whole-fat or sweetened yogurt. Full-fat cheeses or blue cheese. Nondairy creamers and whipped toppings. Processed cheese, cheese spreads, or cheese curds. Condiments Onion and garlic salt, seasoned salt, table salt, and sea salt. Canned and packaged gravies. Worcestershire sauce. Tartar sauce. Barbecue sauce. Teriyaki sauce. Soy sauce, including reduced sodium. Steak sauce. Fish sauce. Oyster sauce. Cocktail sauce. Horseradish. Ketchup and mustard. Meat flavorings and tenderizers. Bouillon cubes. Hot sauce. Tabasco sauce. Marinades. Taco seasonings. Relishes. Fats and Oils Butter, stick margarine, lard, shortening, ghee, and bacon fat. Coconut, palm kernel, or palm oils. Regular salad dressings. Other Pickles and olives. Salted popcorn and pretzels. The items listed above may not be a complete list of foods and beverages to avoid. Contact your dietitian for more information. WHERE CAN I FIND MORE INFORMATION? National Heart, Lung, and Blood Institute: www.nhlbi.nih.gov/health/health-topics/topics/dash/   This information is not intended to replace advice given to you by your health care provider. Make sure you discuss any questions you have with your health care provider.   Document Released: 10/13/2011 Document Revised: 11/14/2014  Document Reviewed: 08/28/2013 Elsevier Interactive Patient Education 2016 Elsevier Inc.  

## 2016-05-01 ENCOUNTER — Other Ambulatory Visit: Payer: Self-pay | Admitting: Family Medicine

## 2016-05-09 ENCOUNTER — Ambulatory Visit (INDEPENDENT_AMBULATORY_CARE_PROVIDER_SITE_OTHER): Payer: Medicare Other | Admitting: Family Medicine

## 2016-05-09 VITALS — BP 124/74 | HR 83 | Temp 97.8°F | Resp 16 | Ht 70.5 in | Wt 233.8 lb

## 2016-05-09 DIAGNOSIS — I10 Essential (primary) hypertension: Secondary | ICD-10-CM

## 2016-05-09 DIAGNOSIS — M7989 Other specified soft tissue disorders: Secondary | ICD-10-CM | POA: Diagnosis not present

## 2016-05-09 LAB — CBC WITH DIFFERENTIAL/PLATELET
BASOS ABS: 47 {cells}/uL (ref 0–200)
BASOS PCT: 1 %
EOS ABS: 188 {cells}/uL (ref 15–500)
EOS PCT: 4 %
HCT: 46.4 % (ref 38.5–50.0)
HEMOGLOBIN: 15.8 g/dL (ref 13.2–17.1)
LYMPHS ABS: 1222 {cells}/uL (ref 850–3900)
Lymphocytes Relative: 26 %
MCH: 29.7 pg (ref 27.0–33.0)
MCHC: 34.1 g/dL (ref 32.0–36.0)
MCV: 87.2 fL (ref 80.0–100.0)
MONOS PCT: 14 %
MPV: 10.2 fL (ref 7.5–12.5)
Monocytes Absolute: 658 cells/uL (ref 200–950)
NEUTROS ABS: 2585 {cells}/uL (ref 1500–7800)
Neutrophils Relative %: 55 %
PLATELETS: 256 10*3/uL (ref 140–400)
RBC: 5.32 MIL/uL (ref 4.20–5.80)
RDW: 13.5 % (ref 11.0–15.0)
WBC: 4.7 10*3/uL (ref 3.8–10.8)

## 2016-05-09 LAB — COMPREHENSIVE METABOLIC PANEL
ALBUMIN: 4.6 g/dL (ref 3.6–5.1)
ALK PHOS: 86 U/L (ref 40–115)
ALT: 35 U/L (ref 9–46)
AST: 32 U/L (ref 10–35)
BILIRUBIN TOTAL: 0.7 mg/dL (ref 0.2–1.2)
BUN: 16 mg/dL (ref 7–25)
CO2: 26 mmol/L (ref 20–31)
CREATININE: 0.89 mg/dL (ref 0.70–1.18)
Calcium: 9.3 mg/dL (ref 8.6–10.3)
Chloride: 103 mmol/L (ref 98–110)
Glucose, Bld: 91 mg/dL (ref 65–99)
Potassium: 4.3 mmol/L (ref 3.5–5.3)
SODIUM: 140 mmol/L (ref 135–146)
TOTAL PROTEIN: 6.9 g/dL (ref 6.1–8.1)

## 2016-05-09 LAB — POCT URINALYSIS DIP (MANUAL ENTRY)
BILIRUBIN UA: NEGATIVE
BILIRUBIN UA: NEGATIVE
Glucose, UA: NEGATIVE
LEUKOCYTES UA: NEGATIVE
NITRITE UA: NEGATIVE
PH UA: 7
PROTEIN UA: NEGATIVE
Spec Grav, UA: 1.02
Urobilinogen, UA: 0.2

## 2016-05-09 MED ORDER — LOSARTAN POTASSIUM 50 MG PO TABS: 50.0000 mg | ORAL_TABLET | Freq: Every day | ORAL | Status: DC

## 2016-05-09 NOTE — Progress Notes (Signed)
Subjective:    Patient ID: Todd [name], male    DOB: 31-Jan-1941, 75 y.o.   MRN: [id number]  05/09/2016  Medication Problem (ankle swelling x 2 weeks concern that its from Amlodipine)   HPI This 75 y.o. male presents for evaluation of B ankle swelling. Onset four weeks ago.  BP medication working well.  Wants to change medication however due to swelling.  If does not wear supportive shoes, feet will swell. At night swelling goes down.  No chest pain, palpitations, DOE, orthopnea, unusual fatigue.  Wife had diverticulitis; s/p surgery in March.  Wife has been trying to gain weight back.    ACE I caused cough. Metoprolol not sure of side effect. Not sure if has taken diuretic.  Nocturia x 3-4.  Drinks a lot of water.     Review of Systems  Constitutional: Negative for fever, chills, diaphoresis, activity change, appetite change and fatigue.  Respiratory: Negative for cough, shortness of breath, wheezing and stridor.   Cardiovascular: Positive for leg swelling. Negative for chest pain and palpitations.  Gastrointestinal: Negative for nausea, vomiting, abdominal pain and diarrhea.  Endocrine: Negative for cold intolerance, heat intolerance, polydipsia, polyphagia and polyuria.  Skin: Negative for color change, rash and wound.  Neurological: Negative for dizziness, tremors, seizures, syncope, facial asymmetry, speech difficulty, weakness, light-headedness, numbness and headaches.  Psychiatric/Behavioral: Negative for sleep disturbance and dysphoric mood. The patient is not nervous/anxious.     Past Medical History:  Diagnosis Date  . Angioedema   . Arthritis   . 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  [facility].  . Cancer (HCC)    skin cancer multiple; followed by [facility] yearly.  . Cataract    4 YEARS - BILATERAL  . Colon polyps     Previous polyps on colonoscopy.  [facility].    . Hiatal hernia 10/20/2013   EGD confirmed.  [facility].  . Hyperlipemia   . Hypertension    no  meds  . Neuromuscular disorder (HCC)    numbness rt hand   Past Surgical History:  Procedure Laterality Date  . BLADDER STONE REMOVAL  2011  . CARPAL TUNNEL RELEASE  01/17/2012   Procedure: CARPAL TUNNEL RELEASE;  Surgeon: [name]., MD;  Location: [facility];  Service: Orthopedics;  Laterality: Right;  . CERVICAL FUSION  1976  . COLONOSCOPY  10/20/2013   [facility].  Polyps.   . ESOPHAGOGASTRODUODENOSCOPY  10/20/2013   [facility]; biopsy of area concernig for Barrett's; gastritis.  Marland Kitchen EYE SURGERY  2012   both cataracts  . FRACTURE SURGERY    . QUADRICEPS TENDON REPAIR  2003   bilateral  . ULNAR NERVE TRANSPOSITION  01/17/2012   Procedure: ULNAR NERVE DECOMPRESSION/TRANSPOSITION;  Surgeon: [name]., MD;  Location: [facility];  Service: Orthopedics;  Laterality: Right;  decompression of ulnar nerve only  at right cubital tunnel  . UPPER GASTROINTESTINAL ENDOSCOPY    . VASECTOMY     Allergies  Allergen Reactions  . Ace Inhibitors Cough  . Amoxicillin Nausea And Vomiting  . Darvon     Chest pain  . Toprol Xl [Metoprolol Tartrate]     Social History   Social History  . Marital status: Married    Spouse name: N/A  . Number of children: 2  . Years of education: N/A   Occupational History  . Retired    Social History Main Topics  . Smoking status: Former Smoker  Packs/day: 2.00    Years: 15.00    Types: Cigarettes    Quit date: 01/11/1974  . Smokeless tobacco: Not on file  . Alcohol use No     Comment: occ  . Drug use: No  . Sexual activity: No   Other Topics Concern  . Not on file   Social History Narrative   Marital status:  Married x 41 years      Children: 2 daughters; 3 grandchildren.      Employment; retired in 1998 from [employer]; retired age 14 from [employer] after 14 years.      Lives: with wife.      Tobacco: former smoker; quit at age 59.  Smoked 15 years.      Alcohol:  On vacation or special  occasions.        Education: [employer].       Exercise: Gym 3-4 times a week for 2 hours.; exercises 7 days per week.   Elliptical for one hour.      Advanced Directives: none; +desires FULL CODE.        ADLs: independent with ADLs; drives.  Does not walk with assistant devices.     Family History  Problem Relation Age of Onset  . Cirrhosis Father   . Hypertension Father   . Alcohol abuse Father   . Heart failure Mother     in 57s  . Arthritis Brother     Rheumatoid arthritis  . Hypertension Brother        Objective:    BP 124/74   Pulse 83   Temp 97.8 F (36.6 C) (Oral)   Resp 16   Ht 5' 10.5" (1.791 m)   Wt 233 lb 12.8 oz (106.1 kg)   SpO2 96%   BMI 33.07 kg/m  Physical Exam  Constitutional: He is oriented to person, place, and time. He appears well-developed and well-nourished. No distress.  HENT:  Head: Normocephalic and atraumatic.  Right Ear: External ear normal.  Left Ear: External ear normal.  Nose: Nose normal.  Mouth/Throat: Oropharynx is clear and moist.  Eyes: Conjunctivae and EOM are normal. Pupils are equal, round, and reactive to light.  Neck: Normal range of motion. Neck supple. Carotid bruit is not present. No thyromegaly present.  Cardiovascular: Normal rate, regular rhythm, normal heart sounds and intact distal pulses.  Exam reveals no gallop and no friction rub.   No murmur heard. Pulmonary/Chest: Effort normal and breath sounds normal. He has no wheezes. He has no rales.  Abdominal: Soft. Bowel sounds are normal. He exhibits no distension and no mass. There is no tenderness. There is no rebound and no guarding.  Musculoskeletal: He exhibits edema.  Trace pitting edema B lower extremities to distal ankles.  Lymphadenopathy:    He has no cervical adenopathy.  Neurological: He is alert and oriented to person, place, and time. No cranial nerve deficit.  Skin: Skin is warm and dry. No rash noted. He is not diaphoretic.  Psychiatric: He has a normal  mood and affect. His behavior is normal.  Nursing note and vitals reviewed.       Assessment & Plan:   1. Leg swelling   2. Essential hypertension    -New; consistent with side effect from Amlodipine. -stop Amlodipine; start Losartan 50mg . -obtain labs.  -no evidence of new onset CHF.   Orders Placed This Encounter  Procedures  . CBC with Differential/Platelet  . Comprehensive metabolic panel  . POCT urinalysis  dipstick   Meds ordered this encounter  Medications  . Misc Natural Products (GLUCOSAMINE CHOND COMPLEX/MSM PO)    Sig: Take by mouth.  . losartan (COZAAR) 50 MG tablet    Sig: Take 1 tablet (50 mg total) by mouth daily.    Dispense:  30 tablet    Refill:  5    Return in about 4 weeks (around 06/06/2016) for recheck blood pressure, swelling in legs.    Gerron Guidotti Paulita Fujita, M.D. [facility] [address] [place], [place]  [address] [phone] phone [phone] fax

## 2016-05-09 NOTE — Patient Instructions (Addendum)
1.  Decrease Amlodipine to 1/2 tablet daily for two weeks and then stop. 2.  Start Losartan one tablet daily.      IF you received an x-ray today, you will receive an invoice from [facility]. Please contact [facility] at [phone] with questions or concerns regarding your invoice.   IF you received labwork today, you will receive an invoice from [employer]. Please contact [facility] at [phone] with questions or concerns regarding your invoice.   Our billing staff will not be able to assist you with questions regarding bills from these companies.  You will be contacted with the lab results as soon as they are available. The fastest way to get your results is to activate your My Chart account. Instructions are located on the last page of this paperwork. If you have not heard from [place] regarding the results in 2 weeks, please contact this office.     Edema Edema is an abnormal buildup of fluids in your bodytissues. Edema is somewhatdependent on gravity to pull the fluid to the lowest place in your body. That makes the condition more common in the legs and thighs (lower extremities). Painless swelling of the feet and ankles is common and becomes more likely as you get older. It is also common in looser tissues, like around your eyes.  When the affected area is squeezed, the fluid may move out of that spot and leave a dent for a few moments. This dent is called pitting.  CAUSES  There are many possible causes of edema. Eating too much salt and being on your feet or sitting for a long time can cause edema in your legs and ankles. Hot weather may make edema worse. Common medical causes of edema include:  Heart failure.  Liver disease.  Kidney disease.  Weak blood vessels in your legs.  Cancer.  An injury.  Pregnancy.  Some medications.  Obesity. SYMPTOMS  Edema is usually painless.Your skin may look swollen or shiny.  DIAGNOSIS   Your health care provider may be able to diagnose edema by asking about your medical history and doing a physical exam. You may need to have tests such as X-rays, an electrocardiogram, or blood tests to check for medical conditions that may cause edema.  TREATMENT  Edema treatment depends on the cause. If you have heart, liver, or kidney disease, you need the treatment appropriate for these conditions. General treatment may include:  Elevation of the affected body part above the level of your heart.  Compression of the affected body part. Pressure from elastic bandages or support stockings squeezes the tissues and forces fluid back into the blood vessels. This keeps fluid from entering the tissues.  Restriction of fluid and salt intake.  Use of a water pill (diuretic). These medications are appropriate only for some types of edema. They pull fluid out of your body and make you urinate more often. This gets rid of fluid and reduces swelling, but diuretics can have side effects. Only use diuretics as directed by your health care provider. HOME CARE INSTRUCTIONS   Keep the affected body part above the level of your heart when you are lying down.   Do not sit still or stand for prolonged periods.   Do not put anything directly under your knees when lying down.  Do not wear constricting clothing or garters on your upper legs.   Exercise your legs to work the fluid back into your blood vessels. This may help the  swelling go down.   Wear elastic bandages or support stockings to reduce ankle swelling as directed by your health care provider.   Eat a low-salt diet to reduce fluid if your health care provider recommends it.   Only take medicines as directed by your health care provider. SEEK MEDICAL CARE IF:   Your edema is not responding to treatment.  You have heart, liver, or kidney disease and notice symptoms of edema.  You have edema in your legs that does not improve after  elevating them.   You have sudden and unexplained weight gain. SEEK IMMEDIATE MEDICAL CARE IF:   You develop shortness of breath or chest pain.   You cannot breathe when you lie down.  You develop pain, redness, or warmth in the swollen areas.   You have heart, liver, or kidney disease and suddenly get edema.  You have a fever and your symptoms suddenly get worse. MAKE SURE YOU:   Understand these instructions.  Will watch your condition.  Will get help right away if you are not doing well or get worse.   This information is not intended to replace advice given to you by your health care provider. Make sure you discuss any questions you have with your health care provider.   Document Released: 10/24/2005 Document Revised: 11/14/2014 Document Reviewed: 08/16/2013 Elsevier Interactive Patient Education [employer].

## 2016-07-06 ENCOUNTER — Ambulatory Visit (INDEPENDENT_AMBULATORY_CARE_PROVIDER_SITE_OTHER): Payer: Medicare Other | Admitting: Family Medicine

## 2016-07-06 ENCOUNTER — Encounter: Payer: Self-pay | Admitting: Family Medicine

## 2016-07-06 VITALS — BP 138/80 | HR 83 | Temp 97.9°F | Resp 18 | Ht 70.5 in | Wt 232.6 lb

## 2016-07-06 DIAGNOSIS — I10 Essential (primary) hypertension: Secondary | ICD-10-CM

## 2016-07-06 DIAGNOSIS — E669 Obesity, unspecified: Secondary | ICD-10-CM

## 2016-07-06 DIAGNOSIS — J301 Allergic rhinitis due to pollen: Secondary | ICD-10-CM

## 2016-07-06 DIAGNOSIS — Z23 Encounter for immunization: Secondary | ICD-10-CM | POA: Diagnosis not present

## 2016-07-06 DIAGNOSIS — R0683 Snoring: Secondary | ICD-10-CM

## 2016-07-06 DIAGNOSIS — G4710 Hypersomnia, unspecified: Secondary | ICD-10-CM

## 2016-07-06 DIAGNOSIS — R351 Nocturia: Secondary | ICD-10-CM | POA: Diagnosis not present

## 2016-07-06 DIAGNOSIS — Z6830 Body mass index (BMI) 30.0-30.9, adult: Secondary | ICD-10-CM

## 2016-07-06 LAB — BASIC METABOLIC PANEL
BUN: 18 mg/dL (ref 7–25)
CALCIUM: 9.3 mg/dL (ref 8.6–10.3)
CO2: 24 mmol/L (ref 20–31)
Chloride: 99 mmol/L (ref 98–110)
Creat: 0.92 mg/dL (ref 0.70–1.18)
Glucose, Bld: 101 mg/dL — ABNORMAL HIGH (ref 65–99)
Potassium: 4.3 mmol/L (ref 3.5–5.3)
SODIUM: 136 mmol/L (ref 135–146)

## 2016-07-06 NOTE — Patient Instructions (Signed)
     IF you received an x-ray today, you will receive an invoice from [facility]. Please contact [facility] at [phone] with questions or concerns regarding your invoice.   IF you received labwork today, you will receive an invoice from [facility]. Please contact [facility] at [phone] with questions or concerns regarding your invoice.   Our billing staff will not be able to assist you with questions regarding bills from these companies.  You will be contacted with the lab results as soon as they are available. The fastest way to get your results is to activate your My Chart account. Instructions are located on the last page of this paperwork. If you have not heard from us regarding the results in 2 weeks, please contact this office.      

## 2016-07-06 NOTE — Progress Notes (Signed)
Patient ID: Todd [name], male   DOB: 06-Dec-1940, 75 y.o.   MRN: [id number]   Subjective:  By signing my name below, I, Todd [name], attest that this documentation has been prepared under the direction and in the presence of Todd Forts, MD. Electronically Signed: Moises [name], [place]. 07/06/2016 , 10:38 AM .  Patient was seen in Room 4 .   Patient ID: Todd [name], male    DOB: 07/05/1941, 75 y.o.   MRN: [id number]  07/06/2016  Hypertension (follow up on BP. Pt. woke up this morning and noticed his BP was high even with medication. )   HPI Todd [name] is a 75 y.o. male who presents to [facility] to follow up on his [name] pressure. He woke up this morning and noticed BP was high even on his medications. He checked his BP this morning at 135/84, but continued to check it 2 more times with BP going higher each time. When he came in today, his BP was 138/84 during triage. He also brought in his home BP readings.   Home Readings  Date Time - BP Time - BP  8/25 8:55PM - 119/72   8/26 6:33AM - 117/80 8:55PM - 122/73  8/27 6:09AM - 109/77 8:31PM 127/78  8/28 6:45AM - 116/78 8:26PM - 123/72  8/29 7:35AM - 110/77 8:29PM - 129/79  8/30 8:30AM 135/84     Sinus headache He has sinus headaches ongoing for a long time from seasonal allergies. He takes zyrtec daily and uses flonase every few days.   Urologist He's been having more nocturia and plans to see Dr. Diona Fanti on Oct 2nd. He's seen him in the past for bladder stone. He mentions his urine stream will occasionally be strong and other times weak. He states drinking more water during the summer. He also drinks an 8oz glass of grape juice at 7:00~8:00PM, and sleeps at around 10:00PM.   Sleep study He also requests referral for a sleep study. He catches himself waking up at night occasionally and also sleeping while sitting during the day.   Review of Systems  Constitutional: Negative for activity change, appetite change, chills,  diaphoresis, fatigue, fever and unexpected weight change.  Eyes: Negative for visual disturbance.  Respiratory: Negative for cough, chest tightness and shortness of breath.   Cardiovascular: Negative for chest pain, palpitations and leg swelling.  Gastrointestinal: Negative for abdominal pain, [name] in stool, diarrhea, nausea and vomiting.  Endocrine: Negative for cold intolerance, heat intolerance, polydipsia, polyphagia and polyuria.  Skin: Negative for color change, rash and wound.  Allergic/Immunologic: Positive for environmental allergies.  Neurological: Positive for headaches. Negative for dizziness, tremors, seizures, syncope, facial asymmetry, speech difficulty, weakness, light-headedness and numbness.  Psychiatric/Behavioral: Negative for dysphoric mood and sleep disturbance. The patient is not nervous/anxious.     Past Medical History:  Diagnosis Date  . Angioedema   . Arthritis   . 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  [facility].  . Cancer ([facility])    skin cancer multiple; followed by [facility]/dermatology yearly.  . Cataract    4 YEARS - BILATERAL  . Colon polyps     Previous polyps on colonoscopy.  [facility].    . Hiatal hernia 10/20/2013   EGD confirmed.  [facility].  . Hyperlipemia   . Hypertension    no meds  . Neuromuscular disorder ([facility])    numbness rt hand   Past Surgical History:  Procedure Laterality Date  . BLADDER STONE REMOVAL  2011  .  CARPAL TUNNEL RELEASE  01/17/2012   Procedure: CARPAL TUNNEL RELEASE;  Surgeon: Cammie Sickle., MD;  Location: [facility];  Service: Orthopedics;  Laterality: Right;  . [place]  . COLONOSCOPY  10/20/2013   [facility].  Polyps.   . ESOPHAGOGASTRODUODENOSCOPY  10/20/2013   [facility]; biopsy of area concernig for Barrett's; gastritis.  [facility] EYE SURGERY  2012   both cataracts  . FRACTURE SURGERY    . QUADRICEPS TENDON REPAIR  2003   bilateral  . ULNAR NERVE TRANSPOSITION  01/17/2012   Procedure: ULNAR NERVE  DECOMPRESSION/TRANSPOSITION;  Surgeon: Cammie Sickle., MD;  Location: [facility];  Service: Orthopedics;  Laterality: Right;  decompression of ulnar nerve only  at right cubital tunnel  . UPPER GASTROINTESTINAL ENDOSCOPY    . VASECTOMY     Allergies  Allergen Reactions  . Ace Inhibitors Cough  . Amoxicillin Nausea And Vomiting  . Darvon     Chest pain  . Toprol Xl [Metoprolol Tartrate]     Social History   Social History  . Marital status: Married    Spouse name: N/A  . Number of children: 2  . Years of education: N/A   Occupational History  . Retired    Social History Main Topics  . Smoking status: Former Smoker    Packs/day: 2.00    Years: 15.00    Types: Cigarettes    Quit date: 01/11/1974  . Smokeless tobacco: Not on file  . Alcohol use No     Comment: occ  . Drug use: No  . Sexual activity: No   Other Topics Concern  . Not on file   Social History Narrative   Marital status:  Married x 41 years      Children: 2 daughters; 3 grandchildren.      Employment; retired in 1998 from [employer]; retired age 69 from [employer] after 14 years.      Lives: with wife.      Tobacco: former smoker; quit at age 11.  Smoked 15 years.      Alcohol:  On vacation or special occasions.        Education: [employer].       Exercise: Gym 3-4 times a week for 2 hours.; exercises 7 days per week.   Elliptical for one hour.      Advanced Directives: none; +desires FULL CODE.        ADLs: independent with ADLs; drives.  Does not walk with assistant devices.     Family History  Problem Relation Age of Onset  . Cirrhosis Father   . Hypertension Father   . Alcohol abuse Father   . Heart failure Mother     in 75s  . Arthritis Brother     Rheumatoid arthritis  . Hypertension Brother        Objective:    BP 138/80   Pulse 83   Temp 97.9 F (36.6 C) (Oral)   Resp 18   Ht 5' 10.5" (1.791 m)   Wt 232 lb 9.6 oz (105.5 kg)   SpO2 97%    BMI 32.90 kg/m   Physical Exam  Constitutional: He is oriented to person, place, and time. He appears well-developed and well-nourished. No distress.  HENT:  Head: Normocephalic and atraumatic.  Right Ear: External ear normal.  Left Ear: External ear normal.  Nose: Nose normal.  Mouth/Throat: Oropharynx is clear and moist.  Eyes: Conjunctivae and EOM  are normal. Pupils are equal, round, and reactive to light.  Neck: Normal range of motion. Neck supple. No JVD present. Carotid bruit is not present. No thyromegaly present.  Cardiovascular: Normal rate, regular rhythm, normal heart sounds and intact distal pulses.  Exam reveals no gallop and no friction rub.   No murmur heard. Pulmonary/Chest: Effort normal and breath sounds normal. He has no wheezes. He has no rales.  Abdominal: Soft. Bowel sounds are normal. He exhibits no distension and no mass. There is no tenderness. There is no rebound and no guarding.  Musculoskeletal: He exhibits no edema.  Lymphadenopathy:    He has no cervical adenopathy.  Neurological: He is alert and oriented to person, place, and time. No cranial nerve deficit.  Skin: Skin is warm and dry. No rash noted. He is not diaphoretic.  Psychiatric: He has a normal mood and affect. His behavior is normal.  Nursing note and vitals reviewed.  Results for orders placed or performed in visit on [id number]  Basic metabolic panel  Result Value Ref Range   Sodium 136 135 - 146 mmol/L   Potassium 4.3 3.5 - 5.3 mmol/L   Chloride 99 98 - 110 mmol/L   CO2 24 20 - 31 mmol/L   Glucose, Bld 101 (H) 65 - 99 mg/dL   BUN 18 7 - 25 mg/dL   Creat 0.92 0.70 - 1.18 mg/dL   Calcium 9.3 8.6 - 10.3 mg/dL       Assessment & Plan:   1. Essential hypertension, benign   2. Nocturia   3. Hypersomnolence   4. Snoring   5. Obese   6. BMI 30.0-30.9,adult   7. Flu vaccine need   8. Seasonal allergic rhinitis due to pollen     Orders Placed This Encounter  Procedures  . Flu Vaccine  QUAD 36+ mos IM  . Basic metabolic panel  . Ambulatory referral to Sleep Studies    Referral Priority:   Routine    Referral Type:   Consultation    Referral Reason:   Specialty Services Required    Number of Visits Requested:   1  . Ambulatory referral to ENT    Referral Priority:   Routine    Referral Type:   Consultation    Referral Reason:   Specialty Services Required    Requested Specialty:   Otolaryngology    Number of Visits Requested:   1   No orders of the defined types were placed in this encounter.   Return in about 3 months (around 10/06/2016) for recheck.   I personally performed the services described in this documentation, which was scribed in my presence. The recorded information has been reviewed and considered.  Kristi Elayne Guerin, M.D. [facility] [address] [place], [place]  [address] [phone] phone [phone] fax

## 2016-07-20 ENCOUNTER — Other Ambulatory Visit: Payer: Self-pay

## 2016-07-20 MED ORDER — LOSARTAN POTASSIUM 50 MG PO TABS: 50.0000 mg | ORAL_TABLET | Freq: Every day | ORAL | 0 refills | Status: DC

## 2016-07-26 ENCOUNTER — Ambulatory Visit: Payer: Medicare Other | Admitting: Family Medicine

## 2016-08-09 ENCOUNTER — Ambulatory Visit (INDEPENDENT_AMBULATORY_CARE_PROVIDER_SITE_OTHER): Payer: Medicare Other | Admitting: Neurology

## 2016-08-09 ENCOUNTER — Encounter: Payer: Self-pay | Admitting: Neurology

## 2016-08-09 VITALS — BP 129/76 | HR 86 | Resp 18 | Ht 70.5 in | Wt 233.0 lb

## 2016-08-09 DIAGNOSIS — R0683 Snoring: Secondary | ICD-10-CM

## 2016-08-09 DIAGNOSIS — G4719 Other hypersomnia: Secondary | ICD-10-CM

## 2016-08-09 DIAGNOSIS — R351 Nocturia: Secondary | ICD-10-CM | POA: Diagnosis not present

## 2016-08-09 DIAGNOSIS — E669 Obesity, unspecified: Secondary | ICD-10-CM

## 2016-08-09 NOTE — Patient Instructions (Signed)

## 2016-08-09 NOTE — Progress Notes (Signed)
Subjective:    Patient ID: Todd [name] is a 75 y.o. male.  HPI     Star Age, MD, PhD [facility] [address], [address] [place], [place] [address]  Dear Dr. Tamala Julian,   I saw your patient, Todd [name], upon your kind request, in my neurologic clinic today for initial consultation of his sleep disorder, in particular, concern for underlying obstructive sleep apnea. The patient is unaccompanied today. As you know, Todd [name] with an underlying medical history of hypertension, history of arthritis, s/p b/l knee surgeries in 2003, skin cancer, cataract, status post repairs, hiatal hernia, hyperlipidemia, history of angioedema, and obesity, who reports snoring and excessive daytime somnolence.His Epworth sleepiness score is 14 out of 24 today. Fatigue score is 15 out of 63. Snoring can be loud. He denies restless leg symptoms. He has had nocturia for years, worse in the past year with 3-4 bathroom visits per night. He has seen urologist for this was advised that his prostate looked fine. He quit smoking in 1973, drinks alcohol occasionally, and drinks about 3 cups of coffee per day but typically no sodas and decaf tea only. I reviewed your office note from 07/06/2016. 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 He tries to exercise regularly, and tries to drink enough water. He has gained weight in the last few years, especially after his retirement.  He does not watch TV in bed, lives with his wife, has 2 grown daughters, one in [place] and the other in [place], [place]. He suspects that his father may have had obstructive sleep apnea. He tries to keep a scheduled for his sleep time and wake up time. They have no pets at this time. He likes to travel since his retirement.  His Past Medical History Is Significant For: Past Medical History:  Diagnosis Date  . Angioedema   .  Arthritis   . 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  [facility].  . Cancer ([facility])    skin cancer multiple; followed by [facility]/dermatology yearly.  . Cataract    4 YEARS - BILATERAL  . Colon polyps     Previous polyps on colonoscopy.  [facility].    . Hiatal hernia 10/20/2013   EGD confirmed.  [facility].  . Hyperlipemia   . Hypertension    no meds  . Neuromuscular disorder ([facility])    numbness rt hand    His Past Surgical History Is Significant For: Past Surgical History:  Procedure Laterality Date  . BLADDER STONE REMOVAL  2011  . CARPAL TUNNEL RELEASE  01/17/2012   Procedure: CARPAL TUNNEL RELEASE;  Surgeon: Cammie Sickle., MD;  Location: [facility];  Service: Orthopedics;  Laterality: Right;  . [place]  . COLONOSCOPY  10/20/2013   [facility].  Polyps.   . ESOPHAGOGASTRODUODENOSCOPY  10/20/2013   [facility]; biopsy of area concernig for Barrett's; gastritis.  Marland Kitchen EYE SURGERY  2012   both cataracts  . FRACTURE SURGERY    . QUADRICEPS TENDON REPAIR  2003   bilateral  . ULNAR NERVE TRANSPOSITION  01/17/2012   Procedure: ULNAR NERVE DECOMPRESSION/TRANSPOSITION;  Surgeon: Cammie Sickle., MD;  Location: [facility];  Service: Orthopedics;  Laterality: Right;  decompression of ulnar nerve only  at right cubital tunnel  . UPPER GASTROINTESTINAL ENDOSCOPY    . VASECTOMY  His Family History Is Significant For: Family History  Problem Relation Age of Onset  . Cirrhosis Father   . Hypertension Father   . Alcohol abuse Father   . Heart failure Mother     in 14s  . Arthritis Brother     Rheumatoid arthritis  . Hypertension Brother     His Social History Is Significant For: Social History   Social History  . Marital status: Married    Spouse name: N/A  . Number of children: 2  . Years of education: 12   Occupational History  . Retired    Social History Main Topics  . Smoking status: Former Smoker    Packs/day: 2.00    Years: 15.00     Types: Cigarettes    Quit date: 01/11/1974  . Smokeless tobacco: Never Used  . Alcohol use No     Comment: occ  . Drug use: No  . Sexual activity: No   Other Topics Concern  . None   Social History Narrative   Marital status:  Married x 41 years      Children: 2 daughters; 3 grandchildren.      Employment; retired in 1998 from [employer]; retired age 43 from [employer] after 14 years.      Lives: with wife.      Tobacco: former smoker; quit at age 26.  Smoked 15 years.      Alcohol:  On vacation or special occasions.        Education: [employer].       Exercise: Gym 3-4 times a week for 2 hours.; exercises 7 days per week.   Elliptical for one hour.      Advanced Directives: none; +desires FULL CODE.        ADLs: independent with ADLs; drives.  Does not walk with assistant devices.     Drinks 3-4 caffeine drinks a day     His Allergies Are:  Allergies  Allergen Reactions  . Ace Inhibitors Cough  . Amoxicillin Nausea And Vomiting  . Darvon     Chest pain  . Toprol Xl [Metoprolol Tartrate]   :   His Current Medications Are:  Outpatient Encounter Prescriptions as of 08/09/2016  Medication Sig  . Ascorbic Acid (VITAMIN C PO) Take 1 capsule by mouth daily.  Marland Kitchen atorvastatin (LIPITOR) 20 MG tablet Take 0.5 tablets (10 mg total) by mouth daily.  . cetirizine (ZYRTEC) 10 MG tablet Take 1 tablet (10 mg total) by mouth at bedtime.  . fish oil-omega-3 fatty acids 1000 MG capsule Take 1 g by mouth 2 (two) times daily.   . fluticasone (FLONASE) 50 MCG/ACT nasal spray Place 2 sprays into both nostrils daily. AS NEEDED  . GARLIC PO Take [id number] mg by mouth 2 (two) times daily.   . Glucosamine HCl (GLUCOSAMINE PO) Take by mouth daily.  Marland Kitchen losartan (COZAAR) 50 MG tablet Take 1 tablet (50 mg total) by mouth daily.  . Misc Natural Products (GLUCOSAMINE CHOND COMPLEX/MSM PO) Take by mouth.  . Multiple Vitamin (MULTIVITAMIN) tablet Take 1 tablet by mouth daily.  . Naproxen Sodium  (ALEVE PO) Take by mouth as needed.  . niacin 500 MG tablet Take 500 mg by mouth Nightly. Reported on 03/01/2016  . saw palmetto 160 MG capsule Take 160 mg by mouth 2 (two) times daily.  . [DISCONTINUED] amLODipine (NORVASC) 5 MG tablet TAKE 1 TABLET (5 MG TOTAL) BY MOUTH DAILY. (Patient not taking: Reported on 07/06/2016)  No facility-administered encounter medications on file as of 08/09/2016.   :  Review of Systems:  Out of a complete 14 point review of systems, all are reviewed and negative with the exception of these symptoms as listed below: Review of Systems  Neurological:       Patient gets up 3-4 times a night to go to bathroom, snoring, wakes up feeling tired if no activity, morning headaches, daytime fatigue if not busy, takes naps. Falls asleep when sitting still.   Epworth Sleepiness Scale 0= would never doze 1= slight chance of dozing 2= moderate chance of dozing 3= high chance of dozing  Sitting and reading:3 Watching TV:2 Sitting inactive in a public place (ex. Theater or meeting):2 As a passenger in a car for an hour without a break:1 Lying down to rest in the afternoon:3 Sitting and talking to someone:0 Sitting quietly after lunch (no alcohol):3 In a car, while stopped in traffic:0 Total:14   Objective:  Neurologic Exam  Physical Exam Physical Examination:   Vitals:   08/09/16 1055  BP: 129/76  Pulse: 86  Resp: 18    General Examination: The patient is a very pleasant 75 y.o. male in no acute distress. He appears well-developed and well-nourished and well groomed.   HEENT: Normocephalic, atraumatic, pupils are equal, round and reactive to light and accommodation. Funduscopic exam is normal with sharp disc margins noted. Extraocular tracking is good without limitation to gaze excursion or nystagmus noted. Normal smooth pursuit is noted. Hearing is grossly intact. Face is symmetric with normal facial animation and normal facial sensation. Speech is clear  with no dysarthria noted. There is no hypophonia. There is no lip, neck/head, jaw or voice tremor. Neck is supple with full range of passive and active motion. There are no carotid bruits on auscultation. Oropharynx exam reveals: mild mouth dryness, adequate dental hygiene and moderate airway crowding, due to larger uvula, which appears swollen, mild pharyngeal irritation, tonsils of 1+, right side more visible. Mallampati is class II. Tongue protrudes centrally and palate elevates symmetrically. Neck size is 18.75 inches. He has a Mild overbite. Nasal inspection reveals no significant nasal mucosal bogginess or redness but significant septal deviation to the R.   Chest: Clear to auscultation without wheezing, rhonchi or crackles noted.  Heart: S1+S2+0, regular and normal without murmurs, rubs or gallops noted.   Abdomen: Soft, non-tender and non-distended with normal bowel sounds appreciated on auscultation.  Extremities: There is no pitting edema in the distal lower extremities bilaterally. Pedal pulses are intact.  Skin: Warm and dry without trophic changes noted. There are no varicose veins. Chronic sun exposure type changes  Musculoskeletal: exam reveals no obvious joint deformities, tenderness or joint swelling or erythema, has decrease in ROM both arms.   Neurologically:  Mental status: The patient is awake, alert and oriented in all 4 spheres. His immediate and remote memory, attention, language skills and fund of knowledge are appropriate. There is no evidence of aphasia, agnosia, apraxia or anomia. Speech is clear with normal prosody and enunciation. Thought process is linear. Mood is normal and affect is normal.  Cranial nerves II - XII are as described above under HEENT exam. In addition: shoulder shrug is normal with equal shoulder height noted. Motor exam: Normal bulk, strength and tone is noted. There is no drift, tremor or rebound. Romberg is negative. Reflexes are 2+ throughout. B.  Fine motor skills and coordination: intact with normal finger taps, normal hand movements, normal rapid alternating patting, normal foot  taps and normal foot agility.  Cerebellar testing: No dysmetria or intention tremor on finger to nose testing. Heel to shin is unremarkable bilaterally. There is no truncal or gait ataxia.  Sensory exam: intact to light touch, pinprick, vibration, temperature sense in the upper and lower extremities.  Gait, station and balance: He stands easily. No veering to one side is noted. No leaning to one side is noted. Posture is age-appropriate and stance is narrow based. Gait shows normal stride length and normal pace. No problems turning are noted. Tandem walk is unremarkable.        Assessment and plan:  In summary, Todd [name] is a very pleasant 75 y.o.-year old male with an underlying medical history of hypertension, history of arthritis, s/p b/l knee surgeries in 2003, skin cancer, cataract, status post repairs, hiatal hernia, hyperlipidemia, history of angioedema, and obesity, whose history and physical exam are concerning for obstructive sleep apnea (OSA). I had a long chat with the patient about my findings and the diagnosis of OSA, its prognosis and treatment options. We talked about medical treatments, surgical interventions and non-pharmacological approaches. I explained in particular the risks and ramifications of untreated moderate to severe OSA, especially with respect to developing cardiovascular disease down the Road, including congestive heart failure, difficult to treat hypertension, cardiac arrhythmias, or stroke. Even type 2 diabetes has, in part, been linked to untreated OSA. Symptoms of untreated OSA include daytime sleepiness, memory problems, mood irritability and mood disorder such as depression and anxiety, lack of energy, as well as recurrent headaches, especially morning headaches. We talked about trying to maintain a healthy lifestyle in general, as  well as the importance of weight control. I encouraged the patient to eat healthy, exercise daily and keep well hydrated, to keep a scheduled bedtime and wake time routine, to not skip any meals and eat healthy snacks in between meals. I advised the patient not to drive when feeling sleepy. I recommended the following at this time: sleep study with potential positive airway pressure titration. (We will score hypopneas at 4% and split the sleep study into diagnostic and treatment portion, if the estimated. 2 hour AHI is >20/h).   I explained the sleep test procedure to the patient and also outlined possible surgical and non-surgical treatment options of OSA, including the use of a custom-made dental device (which would require a referral to a specialist dentist or oral surgeon), upper airway surgical options, such as pillar implants, radiofrequency surgery, tongue base surgery, and UPPP (which would involve a referral to an ENT surgeon). Rarely, jaw surgery such as mandibular advancement may be considered.  I also explained the CPAP treatment option to the patient, who indicated that he would be willing to try CPAP if the need arises. I explained the importance of being compliant with PAP treatment, not only for insurance purposes but primarily to improve His symptoms, and for the patient's long term health benefit, including to reduce His cardiovascular risks. I answered all his questions today and the patient was in agreement. I would like to see him back after the sleep study is completed and encouraged him to call with any interim questions, concerns, problems or updates.   Thank you very much for allowing me to participate in the care of this nice patient. If I can be of any further assistance to you please do not hesitate to call me at [phone].  Sincerely,   Star Age, MD, PhD

## 2016-08-19 ENCOUNTER — Encounter: Payer: Medicare Other | Admitting: Family Medicine

## 2016-08-21 ENCOUNTER — Ambulatory Visit (INDEPENDENT_AMBULATORY_CARE_PROVIDER_SITE_OTHER): Payer: Medicare Other | Admitting: Neurology

## 2016-08-21 DIAGNOSIS — G4733 Obstructive sleep apnea (adult) (pediatric): Secondary | ICD-10-CM

## 2016-08-21 DIAGNOSIS — G4761 Periodic limb movement disorder: Secondary | ICD-10-CM

## 2016-08-21 DIAGNOSIS — G4720 Circadian rhythm sleep disorder, unspecified type: Secondary | ICD-10-CM

## 2016-08-23 ENCOUNTER — Encounter: Payer: Self-pay | Admitting: Family Medicine

## 2016-08-23 ENCOUNTER — Ambulatory Visit (INDEPENDENT_AMBULATORY_CARE_PROVIDER_SITE_OTHER): Payer: Medicare Other | Admitting: Family Medicine

## 2016-08-23 VITALS — BP 124/82 | HR 95 | Temp 98.9°F | Resp 18 | Ht 70.5 in | Wt 236.0 lb

## 2016-08-23 DIAGNOSIS — E6609 Other obesity due to excess calories: Secondary | ICD-10-CM | POA: Diagnosis not present

## 2016-08-23 DIAGNOSIS — M19041 Primary osteoarthritis, right hand: Secondary | ICD-10-CM

## 2016-08-23 DIAGNOSIS — Z125 Encounter for screening for malignant neoplasm of prostate: Secondary | ICD-10-CM | POA: Diagnosis not present

## 2016-08-23 DIAGNOSIS — M19042 Primary osteoarthritis, left hand: Secondary | ICD-10-CM

## 2016-08-23 DIAGNOSIS — E7800 Pure hypercholesterolemia, unspecified: Secondary | ICD-10-CM

## 2016-08-23 DIAGNOSIS — Z23 Encounter for immunization: Secondary | ICD-10-CM | POA: Diagnosis not present

## 2016-08-23 DIAGNOSIS — J301 Allergic rhinitis due to pollen: Secondary | ICD-10-CM | POA: Diagnosis not present

## 2016-08-23 DIAGNOSIS — Z0000 Encounter for general adult medical examination without abnormal findings: Secondary | ICD-10-CM

## 2016-08-23 DIAGNOSIS — I10 Essential (primary) hypertension: Secondary | ICD-10-CM

## 2016-08-23 DIAGNOSIS — Z6831 Body mass index (BMI) 31.0-31.9, adult: Secondary | ICD-10-CM

## 2016-08-23 LAB — COMPREHENSIVE METABOLIC PANEL
ALK PHOS: 79 U/L (ref 40–115)
ALT: 42 U/L (ref 9–46)
AST: 33 U/L (ref 10–35)
Albumin: 4.4 g/dL (ref 3.6–5.1)
BILIRUBIN TOTAL: 1 mg/dL (ref 0.2–1.2)
BUN: 19 mg/dL (ref 7–25)
CALCIUM: 9.2 mg/dL (ref 8.6–10.3)
CO2: 25 mmol/L (ref 20–31)
Chloride: 100 mmol/L (ref 98–110)
Creat: 0.89 mg/dL (ref 0.70–1.18)
GLUCOSE: 106 mg/dL — AB (ref 65–99)
POTASSIUM: 4.1 mmol/L (ref 3.5–5.3)
Sodium: 137 mmol/L (ref 135–146)
TOTAL PROTEIN: 7 g/dL (ref 6.1–8.1)

## 2016-08-23 LAB — LIPID PANEL
CHOLESTEROL: 152 mg/dL (ref 125–200)
HDL: 39 mg/dL — AB (ref >=40)
LDL Cholesterol: 73 mg/dL (ref <130)
Total CHOL/HDL Ratio: 3.9 Ratio (ref <=5.0)
Triglycerides: 201 mg/dL — ABNORMAL HIGH (ref <150)
VLDL: 40 mg/dL — ABNORMAL HIGH (ref <30)

## 2016-08-23 LAB — CBC WITH DIFFERENTIAL/PLATELET
BASOS PCT: 0 %
Basophils Absolute: 0 cells/uL (ref 0–200)
EOS ABS: 275 {cells}/uL (ref 15–500)
Eosinophils Relative: 5 %
HEMATOCRIT: 44.5 % (ref 38.5–50.0)
HEMOGLOBIN: 15.5 g/dL (ref 13.2–17.1)
LYMPHS ABS: 1265 {cells}/uL (ref 850–3900)
Lymphocytes Relative: 23 %
MCH: 29.6 pg (ref 27.0–33.0)
MCHC: 34.8 g/dL (ref 32.0–36.0)
MCV: 84.9 fL (ref 80.0–100.0)
MONO ABS: 605 {cells}/uL (ref 200–950)
MPV: 10.4 fL (ref 7.5–12.5)
Monocytes Relative: 11 %
NEUTROS ABS: 3355 {cells}/uL (ref 1500–7800)
Neutrophils Relative %: 61 %
Platelets: 233 10*3/uL (ref 140–400)
RBC: 5.24 MIL/uL (ref 4.20–5.80)
RDW: 13.9 % (ref 11.0–15.0)
WBC: 5.5 10*3/uL (ref 3.8–10.8)

## 2016-08-23 LAB — POCT URINALYSIS DIP (MANUAL ENTRY)
BILIRUBIN UA: NEGATIVE
GLUCOSE UA: NEGATIVE
Ketones, POC UA: NEGATIVE
Leukocytes, UA: NEGATIVE
NITRITE UA: NEGATIVE
Protein Ur, POC: NEGATIVE
RBC UA: NEGATIVE
Spec Grav, UA: 1.015
UROBILINOGEN UA: 0.2
pH, UA: 7.5

## 2016-08-23 LAB — PSA: PSA: 0.7 ng/mL (ref <=4.0)

## 2016-08-23 MED ORDER — CETIRIZINE HCL 10 MG PO TABS: 10.0000 mg | ORAL_TABLET | Freq: Every day | ORAL | 3 refills | Status: DC

## 2016-08-23 MED ORDER — LOSARTAN POTASSIUM 50 MG PO TABS: 50.0000 mg | ORAL_TABLET | Freq: Every day | ORAL | 3 refills | Status: DC

## 2016-08-23 MED ORDER — ATORVASTATIN CALCIUM 20 MG PO TABS: 10.0000 mg | ORAL_TABLET | Freq: Every day | ORAL | 1 refills | Status: DC

## 2016-08-23 MED ORDER — FLUTICASONE PROPIONATE 50 MCG/ACT NA SUSP: 2.0000 | Freq: Every day | NASAL | 11 refills | Status: DC

## 2016-08-23 NOTE — Progress Notes (Signed)
Subjective:    Patient ID: Todd [name], male    DOB: 1941-08-12, 75 y.o.   MRN: [id number]  08/23/2016  Annual Exam   HPI This 75 y.o. male presents for Annual Wellness Examination and Complete Physical Examination.  Last physical: 08-17-15 Colonoscopy: 2014 TDAP:  2011 Pneumovax:  2009; 2015 Zostavax:  2012 Influenza:  07/06/16 Eye exam:  2017 Dental exam:  09/2016  Allergic Rhinitis: suffering nasal congestion moderate to severe currently; usually this severe every fall season.  Patient reports good compliance with medication, good tolerance to medication, and good symptom control.    Hypersomnolence: s/p sleep study; CPAP placed part of the night; slept really well.   Screening prostate cancer: s/P DRE; recommend PSA; provided medication but not ready to start.  HTN: Patient reports good compliance with medication, good tolerance to medication, and good symptom control.  Home readings 115-138/67-85.  Skin cancers: followed by Dr. Margo Aye once yearly.  Hypercholesterolemia: Patient reports good compliance with medication, good tolerance to medication, and good symptom control.    OA hands: takes Aleve at night PRN; uses  Goody Powders most days one.  Stiffness.    Review of Systems  Constitutional: Negative for activity change, appetite change, chills, diaphoresis, fatigue, fever and unexpected weight change.  HENT: Positive for sinus pressure, sneezing and sore throat. Negative for congestion, dental problem, drooling, ear discharge, ear pain, facial swelling, hearing loss, mouth sores, nosebleeds, postnasal drip, rhinorrhea, tinnitus, trouble swallowing and voice change.   Eyes: Negative for photophobia, pain, discharge, redness, itching and visual disturbance.  Respiratory: Positive for cough. Negative for apnea, choking, chest tightness, shortness of breath, wheezing and stridor.   Cardiovascular: Negative for chest pain, palpitations and leg swelling.  Gastrointestinal:  Negative for abdominal pain, blood in stool, constipation, diarrhea, nausea and vomiting.  Endocrine: Negative for cold intolerance, heat intolerance, polydipsia, polyphagia and polyuria.  Genitourinary: Negative for decreased urine volume, difficulty urinating, discharge, dysuria, enuresis, flank pain, frequency, genital sores, hematuria, penile pain, penile swelling, scrotal swelling, testicular pain and urgency.       Bedtime 10:30-11:00pm; wakes up at 6:30-7:30am. Nocturia x 1.  Weak urination at night; strong stream during the day; no urinary leakage.  Musculoskeletal: Negative for arthralgias, back pain, gait problem, joint swelling, myalgias, neck pain and neck stiffness.  Skin: Negative for color change, pallor, rash and wound.  Allergic/Immunologic: Negative for environmental allergies, food allergies and immunocompromised state.  Neurological: Negative for dizziness, tremors, seizures, syncope, facial asymmetry, speech difficulty, weakness, light-headedness, numbness and headaches.  Hematological: Negative for adenopathy. Does not bruise/bleed easily.  Psychiatric/Behavioral: Positive for dysphoric mood. Negative for agitation, behavioral problems, confusion, decreased concentration, hallucinations, self-injury, sleep disturbance and suicidal ideas. The patient is not nervous/anxious and is not hyperactive.     Past Medical History:  Diagnosis Date  . Allergy   . Angioedema   . Arthritis   . 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  [facility].  . Cancer (HCC)    skin cancer multiple; followed by [facility]/dermatology yearly.  . Cataract    4 YEARS - BILATERAL  . Colon polyps     Previous polyps on colonoscopy.  [facility].    . Hiatal hernia 10/20/2013   EGD confirmed.  [facility].  . Hyperlipemia   . Hypertension    no meds  . Neuromuscular disorder (HCC)    numbness rt hand   Past Surgical History:  Procedure Laterality Date  . BLADDER STONE REMOVAL  2011  .  CARPAL TUNNEL RELEASE   01/17/2012   Procedure: CARPAL TUNNEL RELEASE;  Surgeon: [name]., MD;  Location: [facility];  Service: Orthopedics;  Laterality: Right;  . CERVICAL FUSION  1976  . COLONOSCOPY  10/20/2013   [facility].  Polyps.   . ESOPHAGOGASTRODUODENOSCOPY  10/20/2013   [facility]; biopsy of area concernig for Barrett's; gastritis.  Marland Kitchen EYE SURGERY  2012   both cataracts  . FRACTURE SURGERY    . QUADRICEPS TENDON REPAIR  2003   bilateral  . ULNAR NERVE TRANSPOSITION  01/17/2012   Procedure: ULNAR NERVE DECOMPRESSION/TRANSPOSITION;  Surgeon: [name]., MD;  Location: [facility];  Service: Orthopedics;  Laterality: Right;  decompression of ulnar nerve only  at right cubital tunnel  . UPPER GASTROINTESTINAL ENDOSCOPY    . VASECTOMY     Allergies  Allergen Reactions  . Ace Inhibitors Cough  . Amoxicillin Nausea And Vomiting  . Darvon     Chest pain  . Toprol Xl [Metoprolol Tartrate]    Current Outpatient Prescriptions  Medication Sig Dispense Refill  . Ascorbic Acid (VITAMIN C PO) Take 1 capsule by mouth daily.    Marland Kitchen atorvastatin (LIPITOR) 20 MG tablet Take 0.5 tablets (10 mg total) by mouth daily. 90 tablet 1  . cetirizine (ZYRTEC) 10 MG tablet Take 1 tablet (10 mg total) by mouth at bedtime. 90 tablet 3  . fish oil-omega-3 fatty acids 1000 MG capsule Take 1 g by mouth 2 (two) times daily.     . fluticasone (FLONASE) 50 MCG/ACT nasal spray Place 2 sprays into both nostrils daily. AS NEEDED 16 g 11  . GARLIC PO Take 086 mg by mouth 2 (two) times daily.     . Glucosamine HCl (GLUCOSAMINE PO) Take by mouth daily.    Marland Kitchen losartan (COZAAR) 50 MG tablet Take 1 tablet (50 mg total) by mouth daily. 90 tablet 3  . Misc Natural Products (GLUCOSAMINE CHOND COMPLEX/MSM PO) Take by mouth.    . Multiple Vitamin (MULTIVITAMIN) tablet Take 1 tablet by mouth daily.    . Naproxen Sodium (ALEVE PO) Take by mouth as needed.    . niacin 500 MG tablet Take 500 mg by mouth Nightly.  Reported on 03/01/2016    . saw palmetto 160 MG capsule Take 160 mg by mouth 2 (two) times daily.     No current facility-administered medications for this visit.    Social History   Social History  . Marital status: Married    Spouse name: N/A  . Number of children: 2  . Years of education: 12   Occupational History  . Retired    Social History Main Topics  . Smoking status: Former Smoker    Packs/day: 2.00    Years: 15.00    Types: Cigarettes    Quit date: 01/11/1974  . Smokeless tobacco: Never Used  . Alcohol use No     Comment: occ  . Drug use: No  . Sexual activity: No   Other Topics Concern  . Not on file   Social History Narrative   Marital status:  Married x 42 years      Children: 2 daughters; 3 grandchildren.      Employment; retired in 1998 from [employer]; retired age 89 from [employer] after 14 years.      Lives: with wife.      Tobacco: former smoker; quit at age 61.  Smoked 15 years.  Alcohol:  On vacation or special occasions.        Education: [employer].       Exercise: Gym 3-4 times a week for 2 hours.; exercises 7 days per week.   Elliptical for one hour.      Advanced Directives: none; +desires FULL CODE.        ADLs: independent with ADLs; drives.  Does not walk with assistant devices.     Drinks 3-4 caffeine drinks a day    Family History  Problem Relation Age of Onset  . Cirrhosis Father   . Hypertension Father   . Alcohol abuse Father   . Heart failure Mother     in 23s  . Arthritis Brother     Rheumatoid arthritis  . Hypertension Brother        Objective:    BP 124/82 (BP Location: Left Arm, Patient Position: Sitting, Cuff Size: Small)   Pulse 95   Temp 98.9 F (37.2 C) (Oral)   Resp 18   Ht 5' 10.5" (1.791 m)   Wt 236 lb (107 kg)   SpO2 95%   BMI 33.38 kg/m  Physical Exam  Constitutional: He is oriented to person, place, and time. He appears well-developed and well-nourished. No distress.  HENT:    Head: Normocephalic and atraumatic.  Right Ear: External ear normal.  Left Ear: External ear normal.  Nose: Nose normal.  Mouth/Throat: Oropharynx is clear and moist.  Eyes: Conjunctivae and EOM are normal. Pupils are equal, round, and reactive to light.  Neck: Normal range of motion. Neck supple. Carotid bruit is not present. No thyromegaly present.  Cardiovascular: Normal rate, regular rhythm, normal heart sounds and intact distal pulses.  Exam reveals no gallop and no friction rub.   No murmur heard. Pulmonary/Chest: Effort normal and breath sounds normal. He has no wheezes. He has no rales.  Abdominal: Soft. Bowel sounds are normal. He exhibits no distension and no mass. There is no tenderness. There is no rebound and no guarding.  Musculoskeletal:       Right shoulder: Normal.       Left shoulder: Normal.       Cervical back: Normal.  Lymphadenopathy:    He has no cervical adenopathy.  Neurological: He is alert and oriented to person, place, and time. He has normal reflexes. No cranial nerve deficit. He exhibits normal muscle tone. Coordination normal.  Skin: Skin is warm and dry. No rash noted. He is not diaphoretic.  Psychiatric: He has a normal mood and affect. His behavior is normal. Judgment and thought content normal.   Results for orders placed or performed in visit on 07/06/16  Basic metabolic panel  Result Value Ref Range   Sodium 136 135 - 146 mmol/L   Potassium 4.3 3.5 - 5.3 mmol/L   Chloride 99 98 - 110 mmol/L   CO2 24 20 - 31 mmol/L   Glucose, Bld 101 (H) 65 - 99 mg/dL   BUN 18 7 - 25 mg/dL   Creat 2.53 6.64 - 4.03 mg/dL   Calcium 9.3 8.6 - 47.4 mg/dL   Depression screen [facility] 2/9 07/06/2016 03/01/2016 11/25/2015 08/17/2015 07/07/2015  Decreased Interest 0 0 0 0 0  Down, Depressed, Hopeless 0 0 0 0 0  PHQ - 2 Score 0 0 0 0 0   Functional Status Survey: Is the patient deaf or have difficulty hearing?: No Does the patient have difficulty seeing, even when wearing  glasses/contacts?: No Does the patient have  difficulty concentrating, remembering, or making decisions?: No Does the patient have difficulty walking or climbing stairs?: No Does the patient have difficulty dressing or bathing?: No Does the patient have difficulty doing errands alone such as visiting a doctor's office or shopping?: No Fall Risk  07/06/2016 03/01/2016 08/17/2015 03/09/2015 08/11/2014  Falls in the past year? No No No Yes No  Number falls in past yr: - - - 1 -  Injury with Fall? - - - Yes -      Assessment & Plan:   1. Encounter for Medicare annual wellness exam   2. Routine physical examination   3. Encounter for prostate cancer screening   4. Need for prophylactic vaccination against Streptococcus pneumoniae (pneumococcus)   5. Essential hypertension, benign   6. Acute seasonal allergic rhinitis due to pollen   7. Primary osteoarthritis of both hands   8. Pure hypercholesterolemia   9. BMI 31.0-31.9,adult   10. Class 1 obesity due to excess calories with serious comorbidity and body mass index (BMI) of 31.0 to 31.9 in adult    -anticipatory guidance --- exercise, weight loss, asa 81mg  daily.  -independent with ADLs, low fall risk; no evidence of hearing loss; discussed/reviewed advanced directives. -NO URINARY INCONTINENCE. -obtain labs; continue current medications; refills provided. -s/p sleep study recently; will return for results and treatment recommendations. -s/p urology consultation; needs PSA; declined medication.   Orders Placed This Encounter  Procedures  . Pneumococcal polysaccharide vaccine 23-valent greater than or equal to 2yo subcutaneous/IM  . CBC with Differential/Platelet  . Comprehensive metabolic panel    Order Specific Question:   Has the patient fasted?    Answer:   Yes  . Lipid panel    Order Specific Question:   Has the patient fasted?    Answer:   Yes  . PSA  . POCT urinalysis dipstick   Meds ordered this encounter  Medications  .  losartan (COZAAR) 50 MG tablet    Sig: Take 1 tablet (50 mg total) by mouth daily.    Dispense:  90 tablet    Refill:  3  . fluticasone (FLONASE) 50 MCG/ACT nasal spray    Sig: Place 2 sprays into both nostrils daily. AS NEEDED    Dispense:  16 g    Refill:  11  . cetirizine (ZYRTEC) 10 MG tablet    Sig: Take 1 tablet (10 mg total) by mouth at bedtime.    Dispense:  90 tablet    Refill:  3  . atorvastatin (LIPITOR) 20 MG tablet    Sig: Take 0.5 tablets (10 mg total) by mouth daily.    Dispense:  90 tablet    Refill:  1    Return in about 6 months (around 02/21/2017) for recheck high blood pressure, high cholesterol.   Cimberly Stoffel Paulita Fujita, M.D. [facility] [address] [place], [place]  [phone] phone [phone] fax

## 2016-08-23 NOTE — Patient Instructions (Signed)
IF you received an x-ray today, you will receive an invoice from [facility]. Please contact [facility] at [phone] with questions or concerns regarding your invoice.   IF you received labwork today, you will receive an invoice from [facility]. Please contact [facility] at [phone] with questions or concerns regarding your invoice.   Our billing staff will not be able to assist you with questions regarding bills from these companies.  You will be contacted with the lab results as soon as they are available. The fastest way to get your results is to activate your My Chart account. Instructions are located on the last page of this paperwork. If you have not heard from [place] regarding the results in 2 weeks, please contact this office.     Advance Directive Advance directives are the legal documents that allow you to make choices about your health care and medical treatment if you cannot speak for yourself. Advance directives are a way for you to communicate your wishes to family, friends, and health care providers. The specified people can then convey your decisions about end-of-life care to avoid confusion if you should become unable to communicate. Ideally, the process of discussing and writing advance directives should happen over time rather than making decisions all at once. Advance directives can be modified as your situation changes, and you can change your mind at any time, even after you have signed the advance directives. Each state has its own laws regarding advance directives. You may want to check with your health care provider, attorney, or state representative about the law in your state. Below are some examples of advance directives. LIVING WILL A living will is a set of instructions documenting your wishes about medical care when you cannot care for yourself. It is used if you become:  Terminally ill.  Incapacitated.  Unable  to communicate.  Unable to make decisions. Items to consider in your living will include:  The use or non-use of life-sustaining equipment, such as dialysis machines and breathing machines (ventilators).  A do not resuscitate (DNR) order, which is the instruction not to use cardiopulmonary resuscitation (CPR) if breathing or heartbeat stops.  Tube feeding.  Withholding of food and fluids.  Comfort (palliative) care when the goal becomes comfort rather than a cure.  Organ and tissue donation. A living will does not give instructions about distribution of your money and property if you should pass away. It is advisable to seek the expert advice of a lawyer in drawing up a will regarding your possessions. Decisions about taxes, beneficiaries, and asset distribution will be legally binding. This process can relieve your family and friends of any burdens surrounding disputes or questions that may come up about the allocation of your assets. DO NOT RESUSCITATE (DNR) A do not resuscitate (DNR) order is a request to not have CPR in the event that your heart stops beating or you stop breathing. Unless given other instructions, a health care provider will try to help any patient whose heart has stopped or who has stopped breathing.  HEALTH CARE PROXY AND DURABLE POWER OF ATTORNEY FOR HEALTH CARE A health care proxy is a person (agent) appointed to make medical decisions for you if you cannot. Generally, people choose someone they know well and trust to represent their preferences when they can no longer do so. You should be sure to ask this person for agreement to act as your agent. An agent may have to exercise judgment  in the event of a medical decision for which your wishes are not known. The durable power of attorney for health care is the legal document that names your health care proxy. Once written, it should be:  Signed.  Notarized.  Dated.  Copied.  Witnessed.  Incorporated into your  medical record. You may also want to appoint someone to manage your financial affairs if you cannot. This is called a durable power of attorney for finances. It is a separate legal document from the durable power of attorney for health care. You may choose the same person or someone different from your health care proxy to act as your agent in financial matters.   This information is not intended to replace advice given to you by your health care provider. Make sure you discuss any questions you have with your health care provider.   Document Released: 01/31/2008 Document Revised: 10/29/2013 Document Reviewed: 03/13/2013 Elsevier Interactive Patient Education 2016 [place] you healthy  Get these tests  Blood pressure- Have your blood pressure checked once a year by your healthcare provider.  Normal blood pressure is 120/80  Weight- Have your body mass index (BMI) calculated to screen for obesity.  BMI is a measure of body fat based on height and weight. You can also calculate your own BMI at ViewBanking.[email or web address].  Cholesterol- Have your cholesterol checked every year.  Diabetes- Have your blood sugar checked regularly if you have high blood pressure, high cholesterol, have a family history of diabetes or if you are overweight.  Screening for Colon Cancer- Colonoscopy starting at age 49.  Screening may begin sooner depending on your family history and other health conditions. Follow up colonoscopy as directed by your Gastroenterologist.  Screening for Prostate Cancer- Both blood work (PSA) and a rectal exam help screen for Prostate Cancer.  Screening begins at age 42 with African-American men and at age 42 with Caucasian men.  Screening may begin sooner depending on your family history.  Take these medicines  Aspirin- One aspirin daily can help prevent Heart disease and Stroke.  Flu shot- Every fall.  Tetanus- Every 10 years.  Zostavax- Once after the age of 51 to  prevent Shingles.  Pneumonia shot- Once after the age of 28; if you are younger than 31, ask your healthcare provider if you need a Pneumonia shot.  Take these steps  Don't smoke- If you do smoke, talk to your doctor about quitting.  For tips on how to quit, go to www.smokefree.gov or call 1-800-QUIT-NOW.  Be physically active- Exercise 5 days a week for at least 30 minutes.  If you are not already physically active start slow and gradually work up to 30 minutes of moderate physical activity.  Examples of moderate activity include walking briskly, mowing the yard, dancing, swimming, bicycling, etc.  Eat a healthy diet- Eat a variety of healthy food such as fruits, vegetables, low fat milk, low fat cheese, yogurt, lean meant, poultry, fish, beans, tofu, etc. For more information go to www.thenutritionsource.org  Drink alcohol in moderation- Limit alcohol intake to less than two drinks a day. Never drink and drive.  Dentist- Brush and floss twice daily; visit your dentist twice a year.  Depression- Your emotional health is as important as your physical health. If you're feeling down, or losing interest in things you would normally enjoy please talk to your healthcare provider.  Eye exam- Visit your eye doctor every year.  Safe sex- If you may be exposed to  a sexually transmitted infection, use a condom.  Seat belts- Seat belts can save your life; always wear one.  Smoke/Carbon Monoxide detectors- These detectors need to be installed on the appropriate level of your home.  Replace batteries at least once a year.  Skin cancer- When out in the sun, cover up and use sunscreen 15 SPF or higher.  Violence- If anyone is threatening you, please tell your healthcare provider.  Living Will/ Health care power of attorney- Speak with your healthcare provider and family.

## 2016-08-26 ENCOUNTER — Telehealth: Payer: Self-pay | Admitting: Neurology

## 2016-08-26 DIAGNOSIS — G4733 Obstructive sleep apnea (adult) (pediatric): Secondary | ICD-10-CM

## 2016-08-26 NOTE — Telephone Encounter (Signed)
Diana:  Patient referred by Dr. Tamala Julian, seen by me on 08/09/16, split study on 08/21/16. Please call and notify patient that the recent sleep study confirmed the diagnosis of OSA. He did adequately with CPAP during the study with improvement of the respiratory events. Therefore, I would like start the patient on CPAP therapy at home by prescribing a machine for home use. I placed the order in the chart. The patient will need a follow up appointment with me in 8 to 10 weeks post set up that has to be scheduled; please go ahead and schedule while you have the patient on the phone and make sure patient understands the importance of keeping this window for the FU appointment, as it is often an insurance requirement and failing to adhere to this may result in losing coverage for sleep apnea treatment.  Please re-enforce the importance of compliance with treatment and the need for [place] to monitor compliance data - again an insurance requirement and good feedback for the patient as far as how they are doing.  Also remind patient, that any upcoming CPAP machine or mask issues, should be first addressed with the DME company. Please ask if patient has a preference regarding DME company.  Please arrange for CPAP set up at home through a DME company of patient's choice - once you have spoken to the patient - and faxed/routed report to PCP and referring MD (if other than PCP), you can close this encounter, thanks,   Star Age, MD, PhD [facility] ([place])

## 2016-08-26 NOTE — Progress Notes (Signed)
PATIENT'S NAME:  Todd [name], Todd [name] DOB:      06-25-1941      MR#:    [id number]     DATE OF RECORDING: 08/21/2016 REFERRING M.D.:  Fidela Salisbury, MD Study Performed:  Split-Night Titration Study HISTORY:  75 year old right-handed gentleman with an underlying medical history of hypertension, history of arthritis, s/p b/l knee surgeries in 2003, skin cancer, cataract, status post repairs, hiatal hernia, hyperlipidemia, history of angioedema, and obesity, who reports snoring and excessive daytime somnolence. The Epworth Sleepiness Scale at 14 points. The patient's weight 234 pounds with a height of 70 (inches), resulting in a BMI of 33.5 kg/m2. The patient's neck circumference measured 18 inches.  CURRENT MEDICATIONS: Vitamin C, Lipitor, Zyrtec, Flonase, Garlic, Glucosamine, Cozaar, Multivitamin, Aleve.    PROCEDURE:  This is a multichannel digital polysomnogram utilizing the Somnostar 11.2 system.  Electrodes and sensors were applied and monitored per AASM Specifications.   EEG, EOG, Chin and Limb EMG, were sampled at 200 Hz.  ECG, Snore and Nasal Pressure, Thermal Airflow, Respiratory Effort, CPAP Flow and Pressure, Oximetry was sampled at 50 Hz. Digital video and audio were recorded.      BASELINE STUDY WITHOUT CPAP RESULTS:  Lights Out was at 22:07 and treatment start was at 01:52. Total recording time (TRT) was 225.5, with a total sleep time (TST) of 114.5 minutes.  The patient's sleep latency was 64 minutes, which is delayed.  REM latency was 99.5 minutes.  The sleep efficiency was 50.8 %, which is markedly reduced.    SLEEP ARCHITECTURE: WASO (Wake after sleep onset) was 83 minutes, Stage N1 was 6.5 minutes, Stage N2 was 70 minutes, Stage N3 was 30 minutes and Stage R (REM sleep) was 8 minutes.  The percentages were Stage N1 5.7%, Stage N2 61.1%, Stage N3 26.2% and Stage R (REM sleep) 7.%.   RESPIRATORY ANALYSIS:  There were a total of 55 respiratory events:  15 obstructive apneas, 0 central apneas  and 1 mixed apneas with a total of 16 apneas and an apnea index (AI) of 8.4. There were 39 hypopneas with a hypopnea index of 20.4. The patient also had 0 respiratory event related arousals (RERAs).  Snoring was noted.     The total APNEA/HYPOPNEA INDEX (AHI) was 28.8/hour and the total RESPIRATORY DISTURBANCE INDEX was 28.8 /hour. 9 events occurred in REM sleep and 75 events in NREM. The REM AHI was 67.5, /hour versus a non-REM AHI of 25.9 /hour. The patient spent 1.5 minutes sleep time in the supine position 191 minutes in non-supine. The supine AHI was 0.0 /hour versus a non-supine AHI of 28.8 /hour.  OXYGEN SATURATION & C02:  The wake baseline 02 saturation was 92%, with the lowest being 83%. Time spent below 89% saturation equaled 8 minutes.   PERIODIC LIMB MOVEMENTS: (Baseline)   The patient had a total of 272 Periodic Limb Movements.  The Periodic Limb Movement (PLM) index was 142.5 /hour and the PLM Arousal index was 6.8 /hour.   TITRATION STUDY WITH CPAP RESULTS:   CPAP was initiated at 5 cmH20 with heated humidity per AASM split night standards and pressure was advanced to 8 cmH20 because of hypopneas, apneas and desaturations.  At a PAP pressure of 8 cmH20, there was a reduction of the AHI to 0/hour.   Total recording time (TRT) was 190.5 minutes, with a total sleep time (TST) of 78 minutes. The patient's sleep latency was 109.5 minutes. REM latency was 130.5 minutes.  The sleep efficiency was 40.9 %,  which is markedly reduced.    SLEEP ARCHITECTURE: Wake after sleep was 71.5 minutes, Stage N1 3.5 minutes, Stage N2 25 minutes, Stage N3 31 minutes and Stage R (REM sleep) 18.5 minutes. The percentages were: Stage N1 4.5%, Stage N2 32.1%, Stage N3 39.7% and Stage R (REM sleep) 23.7%.   RESPIRATORY ANALYSIS:  There were a total of 0 respiratory events: 0 obstructive apneas, 0 central apneas and 0 mixed apneas with a total of 0 apneas and an apnea index (AI) of 0. There were 0 hypopneas  with a hypopnea index of 0 /hour. The patient also had 0 respiratory event related arousals (RERAs).      The total APNEA/HYPOPNEA INDEX  (AHI) was 0 /hour and the total RESPIRATORY DISTURBANCE INDEX was 0 /hour.  0 events occurred in REM sleep and 0 events in NREM. The REM AHI was 0 /hour versus a non-REM AHI of 0 /hour. REM sleep was achieved on a pressure of  cm/h2o (AHI was  .) The patient spent 2% of total sleep time in the supine position. The supine AHI was 0.0 /hour, versus a non-supine AHI of 0.0/hour.  OXYGEN SATURATION & C02:  The wake baseline 02 saturation was 93%, with the lowest being 91%. Time spent below 89% saturation equaled 0 minutes.  PERIODIC LIMB MOVEMENTS: (post-treament)   The patient had a total of 93 Periodic Limb Movements. The Periodic Limb Movement (PLM) index was 71.5/hour and the PLM Arousal index was 2.3/hour.  POLYSOMNOGRAPHY IMPRESSION :   1.  Obstructive Sleep Apnea  2.  Periodic Limb Movement Disorder  3.  Dysfunctions associated with sleep stages or arousals from sleep  RECOMMENDATIONS:  1. This patient has moderate to severe obstructive sleep apnea and responded well on CPAP therapy. I will, therefore, start the patient on home CPAP treatment at a pressure of 8 cm via medium nasal mask with heated humidity. The patient should be reminded to be fully compliant with PAP therapy to improve sleep related symptoms and decrease long term cardiovascular risks. Please note that untreated obstructive sleep apnea carries additional perioperative morbidity. Patients with significant obstructive sleep apnea should receive perioperative PAP therapy and the surgeons and particularly the anesthesiologist should be informed of the diagnosis and the severity of the sleep disordered breathing. 2. This study shows sleep fragmentation and abnormal sleep stage percentages; these are nonspecific findings and per se do not signify an intrinsic sleep disorder or a cause for the  patient's sleep-related symptoms. Causes include (but are not limited to) the first night effect of the sleep study, circadian rhythm disturbances, medication effect or an underlying mood disorder or medical problem.  3. Please note that untreated obstructive sleep apnea carries additional perioperative morbidity. Patients with significant obstructive sleep apnea should receive perioperative PAP therapy and the surgeons and particularly the anesthesiologist should be informed of the diagnosis and the severity of the sleep disordered breathing. 4. Severe PLMs (periodic limb movements of sleep) were noted during the two portions of the study with minimal to mild arousals; clinical correlation is recommended.  5. A follow up appointment will be scheduled in the Sleep Clinic at [facility]. The referring provider will be notified of the test results.     I certify that I have reviewed the entire raw data recording prior to the issuance of this report in accordance with the Standards of Accreditation of the [place] [employer] (AASM)       Star Age, MD,PhD Diplomat, American  Board of Psychiatry and Neurology  [place] [place] [facility]

## 2016-08-29 ENCOUNTER — Other Ambulatory Visit: Payer: Self-pay | Admitting: Family Medicine

## 2016-08-29 ENCOUNTER — Encounter: Payer: Self-pay | Admitting: Family Medicine

## 2016-08-29 NOTE — Telephone Encounter (Signed)
I spoke to patient and he is aware of results and recommendations. He is willing to start treatment. I will send orders to [facility]. I will send report to PCP. Patient will receive a letter reminding him to make f/u appt and stress the importance of compliance.

## 2016-08-29 NOTE — Telephone Encounter (Signed)
Pt called to advise Dr Tamala Julian said he should have rec'd a msg by now reg sleep study. I explained Dr Rexene Alberts just left msg for the RN to call on Friday and RN was not in the office on Friday and she would call him as soon as she could

## 2016-09-13 ENCOUNTER — Telehealth: Payer: Self-pay | Admitting: Neurology

## 2016-09-13 NOTE — Telephone Encounter (Signed)
Patient is starting his CPAP tonight and was given an appointment for a 1st CPAP visit on 11-28-16. He thinks he should come in within 30-60 days or his insurance may not pay. I told him to check with his insurance because it may be 90 days but he wanted me to leave you a message so he can discuss with you.

## 2016-09-14 NOTE — Telephone Encounter (Signed)
I spoke to patient and advised him with his insurance, he is ok to see [place] before 90 days. Patient voiced understanding.

## 2016-10-27 ENCOUNTER — Ambulatory Visit (INDEPENDENT_AMBULATORY_CARE_PROVIDER_SITE_OTHER): Payer: Medicare Other | Admitting: Emergency Medicine

## 2016-10-27 VITALS — BP 118/74 | HR 87 | Temp 98.6°F | Resp 16 | Ht 72.0 in | Wt 227.0 lb

## 2016-10-27 DIAGNOSIS — R310 Gross hematuria: Secondary | ICD-10-CM | POA: Diagnosis not present

## 2016-10-27 DIAGNOSIS — R350 Frequency of micturition: Secondary | ICD-10-CM | POA: Diagnosis not present

## 2016-10-27 DIAGNOSIS — N3001 Acute cystitis with hematuria: Secondary | ICD-10-CM

## 2016-10-27 LAB — POC MICROSCOPIC URINALYSIS (UMFC): Mucus: ABSENT

## 2016-10-27 LAB — POCT URINALYSIS DIP (MANUAL ENTRY)
Glucose, UA: NEGATIVE
LEUKOCYTES UA: NEGATIVE
NITRITE UA: NEGATIVE
PH UA: 5.5
Spec Grav, UA: >=1.03
UROBILINOGEN UA: 0.2

## 2016-10-27 MED ORDER — CIPROFLOXACIN HCL 500 MG PO TABS: 500.0000 mg | ORAL_TABLET | Freq: Two times a day (BID) | ORAL | 0 refills | Status: DC

## 2016-10-27 NOTE — Patient Instructions (Addendum)
     IF you received an x-ray today, you will receive an invoice from [facility]. Please contact [facility] at [phone] with questions or concerns regarding your invoice.   IF you received labwork today, you will receive an invoice from [facility]. Please contact [facility] at [phone] with questions or concerns regarding your invoice.   Our billing staff will not be able to assist you with questions regarding bills from these companies.  You will be contacted with the lab results as soon as they are available. The fastest way to get your results is to activate your My Chart account. Instructions are located on the last page of this paperwork. If you have not heard from us regarding the results in 2 weeks, please contact this office.      Hematuria, Adult Hematuria is blood in your urine. It can be caused by a bladder infection, kidney infection, prostate infection, kidney stone, or cancer of your urinary tract. Infections can usually be treated with medicine, and a kidney stone usually will pass through your urine. If neither of these is the cause of your hematuria, further workup to find out the reason may be needed. It is very important that you tell your health care provider about any blood you see in your urine, even if the blood stops without treatment or happens without causing pain. Blood in your urine that happens and then stops and then happens again can be a symptom of a very serious condition. Also, pain is not a symptom in the initial stages of many urinary cancers. Follow these instructions at home:  Drink lots of fluid, 3-4 quarts a day. If you have been diagnosed with an infection, cranberry juice is especially recommended, in addition to large amounts of water.  Avoid caffeine, tea, and carbonated beverages because they tend to irritate the bladder.  Avoid alcohol because it may irritate the prostate.  Take all medicines as directed by your health  care provider.  If you were prescribed an antibiotic medicine, finish it all even if you start to feel better.  If you have been diagnosed with a kidney stone, follow your health care provider's instructions regarding straining your urine to catch the stone.  Empty your bladder often. Avoid holding urine for long periods of time.  After a bowel movement, women should cleanse front to back. Use each tissue only once.  Empty your bladder before and after sexual intercourse if you are a male. Contact a health care provider if:  You develop back pain.  You have a fever.  You have a feeling of sickness in your stomach (nausea) or vomiting.  Your symptoms are not better in 3 days. Return sooner if you are getting worse. Get help right away if:  You develop severe vomiting and are unable to keep the medicine down.  You develop severe back or abdominal pain despite taking your medicines.  You begin passing a large amount of blood or clots in your urine.  You feel extremely weak or faint, or you pass out. This information is not intended to replace advice given to you by your health care provider. Make sure you discuss any questions you have with your health care provider. Document Released: 10/24/2005 Document Revised: 03/31/2016 Document Reviewed: 06/24/2013 Elsevier Interactive Patient Education  2017 Elsevier Inc.  

## 2016-10-27 NOTE — Progress Notes (Signed)
Todd [name] 75 y.o. Hematuria  This is a new problem. The current episode started in the past 7 days. The problem is unchanged. He describes the hematuria as gross hematuria. The hematuria occurs throughout his entire urinary stream. He reports clotting at the beginning of his urine stream. His pain is at a severity of 0/10. He is experiencing no pain. He describes his urine color as tea colored. Irritative symptoms include frequency. Associated symptoms include inability to urinate. Pertinent negatives include no abdominal pain, chills, dysuria, fever, flank pain, genital pain, nausea or vomiting. His past medical history is significant for kidney stones.  Urinary Frequency   Associated symptoms include frequency and hematuria. Pertinent negatives include no chills, flank pain, nausea or vomiting. His past medical history is significant for kidney stones.    Chief Complaint  Patient presents with  . Hematuria    x 3 days  . Urinary Frequency    HISTORY OF PRESENT ILLNESS: This is a 75 y.o. male complaining of.blood in urine x 3 days. Painless hematuria without systemic symptoms.  Prior to Admission medications   Medication Sig Start Date End Date Taking? Authorizing Provider  Ascorbic Acid (VITAMIN C PO) Take 1 capsule by mouth daily.   Yes Historical Provider, MD  atorvastatin (LIPITOR) 20 MG tablet Take 0.5 tablets (10 mg total) by mouth daily. 08/23/16  Yes Wardell Honour, MD  cetirizine (ZYRTEC) 10 MG tablet Take 1 tablet (10 mg total) by mouth at bedtime. 08/23/16  Yes Wardell Honour, MD  fish oil-omega-3 fatty acids 1000 MG capsule Take 1 g by mouth 2 (two) times daily.    Yes Historical Provider, MD  fluticasone (FLONASE) 50 MCG/ACT nasal spray Place 2 sprays into both nostrils daily. AS NEEDED 08/23/16  Yes Wardell Honour, MD  GARLIC PO Take [id number] mg by mouth 2 (two) times daily.    Yes Historical Provider, MD  Glucosamine HCl (GLUCOSAMINE PO) Take by mouth daily.   Yes Historical  Provider, MD  losartan (COZAAR) 50 MG tablet Take 1 tablet (50 mg total) by mouth daily. 08/23/16  Yes Wardell Honour, MD  Misc Natural Products (GLUCOSAMINE CHOND COMPLEX/MSM PO) Take by mouth.   Yes Historical Provider, MD  Multiple Vitamin (MULTIVITAMIN) tablet Take 1 tablet by mouth daily.   Yes Historical Provider, MD  Naproxen Sodium (ALEVE PO) Take by mouth as needed.   Yes Historical Provider, MD  niacin 500 MG tablet Take 500 mg by mouth Nightly. Reported on 03/01/2016   Yes Historical Provider, MD  saw palmetto 160 MG capsule Take 160 mg by mouth 2 (two) times daily.   Yes Historical Provider, MD    Allergies  Allergen Reactions  . Ace Inhibitors Cough  . Amoxicillin Nausea And Vomiting  . Darvon     Chest pain  . Toprol Xl [Metoprolol Tartrate]     Patient Active Problem List   Diagnosis Date Noted  . Primary osteoarthritis of both hands 03/01/2016  . Pure hypercholesterolemia 03/01/2016  . Essential hypertension, benign 03/01/2016  . Seasonal allergic rhinitis due to pollen 03/01/2016  . BMI 31.0-31.9,adult 03/01/2016  . Obesity 03/01/2016  . Hyperlipidemia 08/17/2015  . Essential hypertension 07/21/2014  . Chest pain at rest 03/12/2013  . Angioedema 02/16/2013  . Lipids abnormal 01/30/2012    Past Medical History:  Diagnosis Date  . Allergy   . Angioedema   . Arthritis   . 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  [facility].  Marland Kitchen  Cancer ([place])    skin cancer multiple; followed by [facility]/dermatology yearly.  . Cataract    4 YEARS - BILATERAL  . Colon polyps     Previous polyps on colonoscopy.  [facility].    . Hiatal hernia 10/20/2013   EGD confirmed.  [facility].  . Hyperlipemia   . Hypertension    no meds  . Neuromuscular disorder ([facility])    numbness rt hand    Past Surgical History:  Procedure Laterality Date  . BLADDER STONE REMOVAL  2011  . CARPAL TUNNEL RELEASE  01/17/2012   Procedure: CARPAL TUNNEL RELEASE;  Surgeon: Cammie Sickle., MD;  Location: [facility];  Service: Orthopedics;  Laterality: Right;  . [place]  . COLONOSCOPY  10/20/2013   [facility].  Polyps.   . ESOPHAGOGASTRODUODENOSCOPY  10/20/2013   [facility]; biopsy of area concernig for Barrett's; gastritis.  [facility] EYE SURGERY  2012   both cataracts  . FRACTURE SURGERY    . QUADRICEPS TENDON REPAIR  2003   bilateral  . ULNAR NERVE TRANSPOSITION  01/17/2012   Procedure: ULNAR NERVE DECOMPRESSION/TRANSPOSITION;  Surgeon: Cammie Sickle., MD;  Location: [facility];  Service: Orthopedics;  Laterality: Right;  decompression of ulnar nerve only  at right cubital tunnel  . UPPER GASTROINTESTINAL ENDOSCOPY    . VASECTOMY      Social History   Social History  . Marital status: Married    Spouse name: N/A  . Number of children: 2  . Years of education: 12   Occupational History  . Retired    Social History Main Topics  . Smoking status: Former Smoker    Packs/day: 2.00    Years: 15.00    Types: Cigarettes    Quit date: 01/11/1974  . Smokeless tobacco: Never Used  . Alcohol use No     Comment: occ  . Drug use: No  . Sexual activity: No   Other Topics Concern  . Not on file   Social History Narrative   Marital status:  Married x 42 years      Children: 2 daughters; 3 grandchildren.      Employment; retired in 1998 from [employer]; retired age 89 from [employer] after 14 years.      Lives: with wife.      Tobacco: former smoker; quit at age 68.  Smoked 15 years.      Alcohol:  On vacation or special occasions.        Education: [employer].       Exercise: Gym 3-4 times a week for 2 hours.; exercises 7 days per week.   Elliptical for one hour.      Advanced Directives: none; +desires FULL CODE.        ADLs: independent with ADLs; drives.  Does not walk with assistant devices.     Drinks 3-4 caffeine drinks a day     Family History  Problem Relation Age of Onset  . Cirrhosis Father   . Hypertension Father   .  Alcohol abuse Father   . Heart failure Mother     in 77s  . Arthritis Brother     Rheumatoid arthritis  . Hypertension Brother      Review of Systems  Constitutional: Negative for chills, fever and weight loss.  HENT: Negative.   Eyes: Negative.  Negative for blurred vision and pain.  Respiratory: Negative.  Negative for cough, hemoptysis and shortness of breath.  Cardiovascular: Negative.  Negative for chest pain and leg swelling.  Gastrointestinal: Negative.  Negative for abdominal pain, nausea and vomiting.  Genitourinary: Positive for frequency and hematuria. Negative for dysuria and flank pain.  Skin: Negative.   Neurological: Negative.   Endo/Heme/Allergies: Negative.  Does not bruise/bleed easily.     Physical Exam  Constitutional: He is oriented to person, place, and time. He appears well-developed and well-nourished.  HENT:  Head: Normocephalic and atraumatic.  Eyes: EOM are normal. Pupils are equal, round, and reactive to light.  Neck: Normal range of motion. Neck supple.  Cardiovascular: Normal rate, regular rhythm, normal heart sounds and intact distal pulses.   Pulmonary/Chest: Effort normal and breath sounds normal.  Abdominal: Soft. Bowel sounds are normal.  Musculoskeletal: Normal range of motion.  Neurological: He is alert and oriented to person, place, and time.  Skin: Skin is warm and dry.  Vitals reviewed.  Vitals:   10/27/16 1530  Weight: 227 lb (103 kg)  Height: 6' (1.829 m)   Vitals:   10/27/16 1530  BP: 118/74  Pulse: 87  Resp: 16  Temp: 98.6 F (37 C)     Results for orders placed or performed in visit on 10/27/16 (from the past 24 hour(s))  POCT urinalysis dipstick     Status: Abnormal   Collection Time: 10/27/16  3:46 PM  Result Value Ref Range   Color, UA brown (A) yellow   Clarity, UA cloudy (A) clear   Glucose, UA negative negative   Bilirubin, UA small (A) negative   Ketones, POC UA trace (5) (A) negative   Spec Grav, UA  >=1.030    Blood, UA large (A) negative   pH, UA 5.5    Protein Ur, POC =100 (A) negative   Urobilinogen, UA 0.2    Nitrite, UA Negative Negative   Leukocytes, UA Negative Negative  POCT Microscopic Urinalysis ([facility])     Status: Abnormal   Collection Time: 10/27/16  4:02 PM  Result Value Ref Range   WBC,UR,HPF,POC None None WBC/hpf   RBC,UR,HPF,POC Too numerous to count  (A) None RBC/hpf   Bacteria None None, Too numerous to count   Mucus Absent Absent   Epithelial Cells, UR Per Microscopy Few (A) None, Too numerous to count cells/hpf     ASSESSMENT & PLAN: 1. Acute cystitis with hematuria stable - Ambulatory referral to Urology - Care order/instruction:  2. Urinary frequency  - POCT Microscopic Urinalysis ([facility]) - POCT urinalysis dipstick - Urine culture - Care order/instruction:  3. Gross hematuria Stable. Needs Urology consult and possible cystoscopy to r/o malignancy. - Care order/instruction:   Agustina Caroli, MD [facility]

## 2016-11-03 LAB — URINE CULTURE: ORGANISM ID, BACTERIA: NO GROWTH

## 2016-11-28 ENCOUNTER — Ambulatory Visit (INDEPENDENT_AMBULATORY_CARE_PROVIDER_SITE_OTHER): Payer: Medicare Other | Admitting: Neurology

## 2016-11-28 ENCOUNTER — Encounter: Payer: Self-pay | Admitting: Neurology

## 2016-11-28 VITALS — BP 144/70 | HR 88 | Resp 16 | Ht 72.0 in | Wt 230.0 lb

## 2016-11-28 DIAGNOSIS — G4733 Obstructive sleep apnea (adult) (pediatric): Secondary | ICD-10-CM | POA: Diagnosis not present

## 2016-11-28 DIAGNOSIS — Z9989 Dependence on other enabling machines and devices: Secondary | ICD-10-CM

## 2016-11-28 NOTE — Patient Instructions (Signed)
Please continue using your CPAP regularly. While your insurance requires that you use CPAP at least 4 hours each night on 70% of the nights, I recommend, that you not skip any nights and use it throughout the night if you can. Getting used to CPAP and staying with the treatment long term does take time and patience and discipline. Untreated obstructive sleep apnea when it is moderate to severe can have an adverse impact on cardiovascular health and raise her risk for heart disease, arrhythmias, hypertension, congestive heart failure, stroke and diabetes. Untreated obstructive sleep apnea causes sleep disruption, nonrestorative sleep, and sleep deprivation. This can have an impact on your day to day functioning and cause daytime sleepiness and impairment of cognitive function, memory loss, mood disturbance, and problems focussing. Using CPAP regularly can improve these symptoms. I would like for you to see one of our nurse practitioners in a couple of months for recheck of your sleep apnea index. I would like to increase your CPAP pressure to 10 cm at this time. I will see you back after that.

## 2016-11-28 NOTE — Progress Notes (Signed)
Subjective:    Patient ID: Todd [name] is a 76 y.o. male.  HPI     Interim history:   Todd [name] is a 76 year old right-handed gentleman with an underlying medical history of hypertension, history of arthritis, s/p b/l knee surgeries in 2003, skin [name], cataract, status post repairs, hiatal hernia, hyperlipidemia, history of angioedema, and obesity, who presents for follow-up consultation of his obstructive sleep apnea, after his split-night sleep study, and now on CPAP therapy. The patient is unaccompanied today. I first met him on 08/09/2016 at the request of his primary care physician, at which time he reported snoring and excessive daytime somnolence as well as worsening nocturia. I invited him for sleep study. He had a split-night sleep study on 08/21/2016. We went over his test results today. Baseline sleep efficiency was markedly reduced at 50.8%, wake after sleep onset was 83 minutes, REM latency was normal, sleep latency delayed. Total AHI was 28.8 per hour, REM AHI was 67.5 per hour, O2 nadir was 83%.  He was titrated on CPAP from 5 cm to 8 cm, AHI was 0 per hour on a pressure of 8 cm. He had severe PLMS during the baseline and the CPAP titration portion of the study with minimal to mild arousals.  Today, 11/28/2016: I reviewed his CPAP compliance data from 10/29/2016 through 11/27/2016, which is a total of 30 days, during which time he used his CPAP every night with percent used days greater than 4 hours at 100%, indicating superb compliance with an average usage of 8 hours and 11 minutes, residual AHI elevated at 28.7 per hour, mostly obstructive in nature, leak low with the 95th percentile at 7.2 L/m on a pressure of 8 cm with EPR of 3.  Today, 11/28/2016: He reports doing well with CPAP, he has adjusted well to treatment. He feels improved with his EDS, AM HAs, FFM is well tolerated, nocturia better.   Previously:   08/21/2016: He reports snoring and excessive daytime somnolence.  His Epworth sleepiness score is 14 out of 24 today. Fatigue score is 15 out of 63. Snoring can be loud. He denies restless leg symptoms. He has had nocturia for years, worse in the past year with 3-4 bathroom visits per night. He has seen urologist for this was advised that his prostate looked fine. He quit smoking in 1973, drinks alcohol occasionally, and drinks about 3 cups of coffee per day but typically no sodas and decaf tea only. I reviewed your office note from 07/06/2016. 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 Todd [name], retired in 2012. He tries to exercise regularly, and tries to drink enough water. He has gained weight in the last few years, especially after his retirement.  He does not watch TV in bed, lives with his wife, has 2 grown daughters, one in [place] and the other in [place], [place]. He suspects that his father may have had obstructive sleep apnea. He tries to keep a scheduled for his sleep time and wake up time. They have no pets at this time. He likes to travel since his retirement.   Her Past Medical History Is Significant For: Past Medical History:  Diagnosis Date  . Allergy   . Angioedema   . Arthritis   . 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  [facility].  . [name] ([place])    skin [name] multiple; followed by [facility]/dermatology yearly.  . Cataract    4 YEARS -  BILATERAL  . Colon polyps     Previous polyps on colonoscopy.  [facility].    . Hiatal hernia 10/20/2013   EGD confirmed.  [facility].  . Hyperlipemia   . Hypertension    no meds  . Neuromuscular disorder (HCC)    numbness rt hand   His Past Surgical History Is Significant For: Past Surgical History:  Procedure Laterality Date  . BLADDER STONE REMOVAL  2011  . CARPAL TUNNEL RELEASE  01/17/2012   Procedure: CARPAL TUNNEL RELEASE;  Surgeon: Cammie Sickle., MD;  Location: [place];  Service: Orthopedics;  Laterality: Right;  . [place]  .  COLONOSCOPY  10/20/2013   [facility].  Polyps.   . ESOPHAGOGASTRODUODENOSCOPY  10/20/2013   [facility]; biopsy of area concernig for Barrett's; gastritis.  Marland Kitchen EYE SURGERY  2012   both cataracts  . FRACTURE SURGERY    . QUADRICEPS TENDON REPAIR  2003   bilateral  . ULNAR NERVE TRANSPOSITION  01/17/2012   Procedure: ULNAR NERVE DECOMPRESSION/TRANSPOSITION;  Surgeon: Cammie Sickle., MD;  Location: [place];  Service: Orthopedics;  Laterality: Right;  decompression of ulnar nerve only  at right cubital tunnel  . UPPER GASTROINTESTINAL ENDOSCOPY    . VASECTOMY      His Family History Is Significant For: Family History  Problem Relation Age of Onset  . Cirrhosis Father   . Hypertension Father   . Alcohol abuse Father   . Heart failure Mother     in 16s  . Arthritis Brother     Rheumatoid arthritis  . Hypertension Brother     His Social History Is Significant For: Social History   Social History  . Marital status: Married    Spouse name: N/A  . Number of children: 2  . Years of education: 12   Occupational History  . Retired    Social History Main Topics  . Smoking status: Former Smoker    Packs/day: 2.00    Years: 15.00    Types: Cigarettes    Quit date: 01/11/1974  . Smokeless tobacco: Never Used  . Alcohol use No     Comment: occ  . Drug use: No  . Sexual activity: No   Other Topics Concern  . None   Social History Narrative   Marital status:  Married x 42 years      Children: 2 daughters; 3 grandchildren.      Employment; retired in 1998 from [employer]; retired age 89 from [employer] after 14 years.      Lives: with wife.      Tobacco: former smoker; quit at age 55.  Smoked 15 years.      Alcohol:  On vacation or special occasions.        Education: [employer].       Exercise: Gym 3-4 times a week for 2 hours.; exercises 7 days per week.   Elliptical for one hour.      Advanced Directives: none; +desires FULL CODE.        ADLs:  independent with ADLs; drives.  Does not walk with assistant devices.     Drinks 3-4 caffeine drinks a day     His Allergies Are:  Allergies  Allergen Reactions  . Ace Inhibitors Cough  . Amoxicillin Nausea And Vomiting  . Darvon     Chest pain  . Toprol Xl [Metoprolol Tartrate]   :   His Current Medications Are:  Outpatient Encounter Prescriptions as of 11/28/2016  Medication Sig  . Ascorbic Acid (VITAMIN C PO) Take 1 capsule by mouth daily.  Marland Kitchen atorvastatin (LIPITOR) 20 MG tablet Take 0.5 tablets (10 mg total) by mouth daily.  . cetirizine (ZYRTEC) 10 MG tablet Take 1 tablet (10 mg total) by mouth at bedtime. (Patient taking differently: Take 10 mg by mouth daily. )  . ciprofloxacin (CIPRO) 500 MG tablet Take 1 tablet (500 mg total) by mouth 2 (two) times daily.  . fish oil-omega-3 fatty acids 1000 MG capsule Take 1 g by mouth 2 (two) times daily.   . fluticasone (FLONASE) 50 MCG/ACT nasal spray Place 2 sprays into both nostrils daily. AS NEEDED  . GARLIC PO Take 382 mg by mouth 2 (two) times daily.   Marland Kitchen losartan (COZAAR) 50 MG tablet Take 1 tablet (50 mg total) by mouth daily.  . Misc Natural Products (GLUCOSAMINE CHOND COMPLEX/MSM PO) Take by mouth.  . Multiple Vitamin (MULTIVITAMIN) tablet Take 1 tablet by mouth daily.  . Naproxen Sodium (ALEVE PO) Take by mouth as needed.  . saw palmetto 160 MG capsule Take 160 mg by mouth 2 (two) times daily.  . [DISCONTINUED] Glucosamine HCl (GLUCOSAMINE PO) Take by mouth daily.  . [DISCONTINUED] niacin 500 MG tablet Take 500 mg by mouth Nightly. Reported on 03/01/2016   No facility-administered encounter medications on file as of 11/28/2016.   :  Review of Systems:  Out of a complete 14 point review of systems, all are reviewed and negative with the exception of these symptoms as listed below: Review of Systems  Neurological:       Patient states that he is doing well with CPAP. No new concerns. Patient has 1 question about the humidity  setting.    Objective:  Neurologic Exam  Physical Exam Physical Examination:   Vitals:   11/28/16 1013  BP: (!) 144/70  Pulse: 88  Resp: 16   General Examination: The patient is a very pleasant 76 y.o. male in no acute distress. He appears well-developed and well-nourished and well groomed. Good spirits today.  HEENT: Normocephalic, atraumatic, pupils are equal, round and reactive to light and accommodation. Extraocular tracking is good without limitation to gaze excursion or nystagmus noted. Normal smooth pursuit is noted. Hearing is grossly intact. Face is symmetric with normal facial animation and normal facial sensation. Speech is clear with no dysarthria noted. There is no hypophonia. There is no lip, neck/head, jaw or voice tremor. Neck is supple with full range of passive and active motion. Oropharynx exam reveals: mild mouth dryness, adequate dental hygiene and moderate airway crowding, due to larger uvula, which appears swollen, mild pharyngeal irritation, tonsils of 1+, right side more visible. Mallampati is class II. Tongue protrudes centrally and palate elevates symmetrically. Nasal inspection reveals no significant nasal mucosal bogginess or redness but significant septal deviation to the R.   Chest: Clear to auscultation without wheezing, rhonchi or crackles noted.  Heart: S1+S2+0, regular and normal without murmurs, rubs or gallops noted.   Abdomen: Soft, non-tender and non-distended with normal bowel sounds appreciated on auscultation.  Extremities: There is no pitting edema in the distal lower extremities bilaterally.   Skin: Warm and dry without trophic changes noted. There are no varicose veins. Chronic sun exposure type changes  Musculoskeletal: exam reveals no obvious joint deformities, tenderness or joint swelling or erythema, has decrease in ROM both arms.   Neurologically:  Mental status: The patient is awake, alert and oriented in all  4 spheres. His immediate  and remote memory, attention, language skills and fund of knowledge are appropriate. There is no evidence of aphasia, agnosia, apraxia or anomia. Speech is clear with normal prosody and enunciation. Thought process is linear. Mood is normal and affect is normal.  Cranial nerves II - XII are as described above under HEENT exam. In addition: shoulder shrug is normal with equal shoulder height noted. Motor exam: Normal bulk, strength and tone is noted. There is no drift, tremor or rebound. Romberg is negative. Reflexes are 2+ throughout. Fine motor skills and coordination: intact with normal finger taps, normal hand movements, normal rapid alternating patting, normal foot taps and normal foot agility.  Cerebellar testing: No dysmetria or intention tremor on finger to nose testing. Heel to shin is unremarkable bilaterally. There is no truncal or gait ataxia.  Sensory exam: intact to light touch in the upper and lower extremities.  Gait, station and balance: He stands easily. No veering to one side is noted. No leaning to one side is noted. Posture is age-appropriate and stance is narrow based. Gait shows normal stride length and normal pace. No problems turning are noted. Tandem walk is unremarkable.      Assessment and plan:  In summary, Todd [name] is a very pleasant 76 year old male with an underlying medical history of hypertension, history of arthritis, s/p b/l knee surgeries in 2003, skin [name], cataract, status post repairs, hiatal hernia, hyperlipidemia, history of angioedema, and obesity, who Presents for follow-up consultation of his moderate to severe obstructive sleep apnea after a split-night sleep study on 08/21/2016. He has established treatment with CPAP and is fully compliant with it. He is commended for this. We talked about his sleep study results in detail today. We also went over his compliance data. Residual AHI is elevated. I would like to increase his pressure to 10 cm. He uses a  fullface mask and tolerates it. He is advised to follow-up in 2 months for recheck on his compliance and particularly to monitor his AHI. His physical exam is stable. He indicates good results with CPAP therapy, particularly with improved daytime somnolence, improved morning headaches and improved nocturia. He does have an appointment with urology because of recent hematuria noted. I will see him back after his nurse practitioner visit. I explained the risks and ramifications of untreated moderate to severe OSA, especially with respect to developing cardiovascular disease down the Road.  I explained the importance of being compliant with PAP treatment, not only for insurance purposes but primarily to improve His symptoms, and for the patient's long term health benefit, including to reduce His cardiovascular risks. I answered all his questions today and the patient was in agreement.  I spent 25 minutes in total face-to-face time with the patient, more than 50% of which was spent in counseling and coordination of care, reviewing test results, reviewing medication and discussing or reviewing the diagnosis of OSA, its prognosis and treatment options.

## 2016-12-29 ENCOUNTER — Other Ambulatory Visit: Payer: Self-pay | Admitting: Urology

## 2017-01-02 ENCOUNTER — Encounter (HOSPITAL_BASED_OUTPATIENT_CLINIC_OR_DEPARTMENT_OTHER): Payer: Self-pay | Admitting: *Deleted

## 2017-01-02 NOTE — Progress Notes (Signed)
Pt instructed npo pmn 3/4 x zyrtec prn.  To [facility] 3/5 @ 0630.  Needs istat, ? Ekg.on arrival.

## 2017-01-09 ENCOUNTER — Encounter (HOSPITAL_BASED_OUTPATIENT_CLINIC_OR_DEPARTMENT_OTHER)
Admission: RE | Disposition: A | Discharge status: Home / Self Care | Payer: Self-pay | Source: Ambulatory Visit | Attending: Urology

## 2017-01-09 ENCOUNTER — Ambulatory Visit (HOSPITAL_BASED_OUTPATIENT_CLINIC_OR_DEPARTMENT_OTHER): Payer: Medicare Other | Admitting: Anesthesiology

## 2017-01-09 ENCOUNTER — Ambulatory Visit (HOSPITAL_BASED_OUTPATIENT_CLINIC_OR_DEPARTMENT_OTHER)
Admission: RE | Admit: 2017-01-09 | Discharge: 2017-01-09 | Disposition: A | Discharge status: Home / Self Care | Payer: Medicare Other | Source: Ambulatory Visit | Attending: Urology | Admitting: Urology

## 2017-01-09 ENCOUNTER — Encounter (HOSPITAL_BASED_OUTPATIENT_CLINIC_OR_DEPARTMENT_OTHER): Payer: Self-pay

## 2017-01-09 DIAGNOSIS — Z8261 Family history of arthritis: Secondary | ICD-10-CM | POA: Diagnosis not present

## 2017-01-09 DIAGNOSIS — Z79899 Other long term (current) drug therapy: Secondary | ICD-10-CM | POA: Diagnosis not present

## 2017-01-09 DIAGNOSIS — Z86010 Personal history of colonic polyps: Secondary | ICD-10-CM | POA: Diagnosis not present

## 2017-01-09 DIAGNOSIS — Z8249 Family history of ischemic heart disease and other diseases of the circulatory system: Secondary | ICD-10-CM | POA: Diagnosis not present

## 2017-01-09 DIAGNOSIS — Z85828 Personal history of other malignant neoplasm of skin: Secondary | ICD-10-CM | POA: Diagnosis not present

## 2017-01-09 DIAGNOSIS — N400 Enlarged prostate without lower urinary tract symptoms: Secondary | ICD-10-CM | POA: Diagnosis not present

## 2017-01-09 DIAGNOSIS — Z811 Family history of alcohol abuse and dependence: Secondary | ICD-10-CM | POA: Insufficient documentation

## 2017-01-09 DIAGNOSIS — Z9841 Cataract extraction status, right eye: Secondary | ICD-10-CM | POA: Diagnosis not present

## 2017-01-09 DIAGNOSIS — Z981 Arthrodesis status: Secondary | ICD-10-CM | POA: Diagnosis not present

## 2017-01-09 DIAGNOSIS — Z7951 Long term (current) use of inhaled steroids: Secondary | ICD-10-CM | POA: Diagnosis not present

## 2017-01-09 DIAGNOSIS — Z87891 Personal history of nicotine dependence: Secondary | ICD-10-CM | POA: Insufficient documentation

## 2017-01-09 DIAGNOSIS — G4730 Sleep apnea, unspecified: Secondary | ICD-10-CM | POA: Diagnosis not present

## 2017-01-09 DIAGNOSIS — I10 Essential (primary) hypertension: Secondary | ICD-10-CM | POA: Insufficient documentation

## 2017-01-09 DIAGNOSIS — Z9842 Cataract extraction status, left eye: Secondary | ICD-10-CM | POA: Diagnosis not present

## 2017-01-09 DIAGNOSIS — Z8379 Family history of other diseases of the digestive system: Secondary | ICD-10-CM | POA: Insufficient documentation

## 2017-01-09 DIAGNOSIS — G709 Myoneural disorder, unspecified: Secondary | ICD-10-CM | POA: Diagnosis not present

## 2017-01-09 DIAGNOSIS — E785 Hyperlipidemia, unspecified: Secondary | ICD-10-CM | POA: Insufficient documentation

## 2017-01-09 DIAGNOSIS — N210 Calculus in bladder: Secondary | ICD-10-CM | POA: Insufficient documentation

## 2017-01-09 DIAGNOSIS — Z9852 Vasectomy status: Secondary | ICD-10-CM | POA: Insufficient documentation

## 2017-01-09 DIAGNOSIS — Z888 Allergy status to other drugs, medicaments and biological substances status: Secondary | ICD-10-CM | POA: Insufficient documentation

## 2017-01-09 DIAGNOSIS — K449 Diaphragmatic hernia without obstruction or gangrene: Secondary | ICD-10-CM | POA: Insufficient documentation

## 2017-01-09 DIAGNOSIS — Z880 Allergy status to penicillin: Secondary | ICD-10-CM | POA: Diagnosis not present

## 2017-01-09 DIAGNOSIS — Z9889 Other specified postprocedural states: Secondary | ICD-10-CM | POA: Insufficient documentation

## 2017-01-09 HISTORY — DX: Calculus in bladder: N21.0

## 2017-01-09 HISTORY — DX: Sleep apnea, unspecified: G47.30

## 2017-01-09 HISTORY — PX: HOLMIUM LASER APPLICATION: SHX5852

## 2017-01-09 HISTORY — DX: Other seasonal allergic rhinitis: J30.2

## 2017-01-09 HISTORY — PX: CYSTOSCOPY WITH LITHOLAPAXY: SHX1425

## 2017-01-09 LAB — POCT I-STAT 4, (NA,K, GLUC, HGB,HCT)
Glucose, Bld: 124 mg/dL — ABNORMAL HIGH (ref 65–99)
HCT: 42 % (ref 39.0–52.0)
HEMOGLOBIN: 14.3 g/dL (ref 13.0–17.0)
POTASSIUM: 4.1 mmol/L (ref 3.5–5.1)
Sodium: 140 mmol/L (ref 135–145)

## 2017-01-09 SURGERY — CYSTOSCOPY, WITH BLADDER CALCULUS LITHOLAPAXY
Anesthesia: General | Site: Bladder

## 2017-01-09 MED ORDER — CIPROFLOXACIN IN D5W 400 MG/200ML IV SOLN
INTRAVENOUS | Status: AC
  Filled 2017-01-09: qty 200

## 2017-01-09 MED ORDER — PROMETHAZINE HCL 25 MG/ML IJ SOLN
6.2500 mg | INTRAMUSCULAR | Status: DC | PRN
  Filled 2017-01-09: qty 1

## 2017-01-09 MED ORDER — METHYLENE BLUE 0.5 % INJ SOLN
INTRAVENOUS | Status: AC
  Filled 2017-01-09: qty 10

## 2017-01-09 MED ORDER — LACTATED RINGERS IV SOLN
INTRAVENOUS | Status: DC
  Administered 2017-01-09: 07:00:00 via INTRAVENOUS
  Filled 2017-01-09: qty 1000

## 2017-01-09 MED ORDER — ONDANSETRON HCL 4 MG/2ML IJ SOLN
INTRAMUSCULAR | Status: AC
  Filled 2017-01-09: qty 2

## 2017-01-09 MED ORDER — DEXAMETHASONE SODIUM PHOSPHATE 10 MG/ML IJ SOLN
INTRAMUSCULAR | Status: AC
  Filled 2017-01-09: qty 1

## 2017-01-09 MED ORDER — PHENAZOPYRIDINE HCL 200 MG PO TABS: 200.0000 mg | ORAL_TABLET | Freq: Three times a day (TID) | ORAL | 0 refills | Status: DC | PRN

## 2017-01-09 MED ORDER — BELLADONNA ALKALOIDS-OPIUM 16.2-60 MG RE SUPP
RECTAL | Status: AC
Start: 2017-01-09 — End: 2017-01-09
  Filled 2017-01-09: qty 1

## 2017-01-09 MED ORDER — PROPOFOL 10 MG/ML IV BOLUS
INTRAVENOUS | Status: DC | PRN
  Administered 2017-01-09: 200 mg via INTRAVENOUS

## 2017-01-09 MED ORDER — LIDOCAINE HCL 2 % EX GEL
CUTANEOUS | Status: AC
  Filled 2017-01-09: qty 5

## 2017-01-09 MED ORDER — FENTANYL CITRATE (PF) 100 MCG/2ML IJ SOLN
25.0000 ug | INTRAMUSCULAR | Status: DC | PRN
  Filled 2017-01-09: qty 1

## 2017-01-09 MED ORDER — FENTANYL CITRATE (PF) 100 MCG/2ML IJ SOLN
INTRAMUSCULAR | Status: AC
  Filled 2017-01-09: qty 2

## 2017-01-09 MED ORDER — ONDANSETRON HCL 4 MG/2ML IJ SOLN
INTRAMUSCULAR | Status: DC | PRN
  Administered 2017-01-09: 4 mg via INTRAVENOUS

## 2017-01-09 MED ORDER — MIDAZOLAM HCL 2 MG/2ML IJ SOLN
INTRAMUSCULAR | Status: AC
  Filled 2017-01-09: qty 2

## 2017-01-09 MED ORDER — FENTANYL CITRATE (PF) 100 MCG/2ML IJ SOLN
INTRAMUSCULAR | Status: DC | PRN
  Administered 2017-01-09: 50 ug via INTRAVENOUS

## 2017-01-09 MED ORDER — KETOROLAC TROMETHAMINE 30 MG/ML IJ SOLN
INTRAMUSCULAR | Status: DC | PRN
  Administered 2017-01-09: 30 mg via INTRAVENOUS

## 2017-01-09 MED ORDER — KETOROLAC TROMETHAMINE 30 MG/ML IJ SOLN
INTRAMUSCULAR | Status: AC
  Filled 2017-01-09: qty 1

## 2017-01-09 MED ORDER — PHENAZOPYRIDINE HCL 200 MG PO TABS
200.0000 mg | ORAL_TABLET | Freq: Three times a day (TID) | ORAL | Status: DC
  Administered 2017-01-09: 200 mg via ORAL
  Filled 2017-01-09: qty 1

## 2017-01-09 MED ORDER — CIPROFLOXACIN IN D5W 400 MG/200ML IV SOLN
400.0000 mg | Freq: Two times a day (BID) | INTRAVENOUS | Status: DC
  Administered 2017-01-09: 400 mg via INTRAVENOUS
  Filled 2017-01-09: qty 200

## 2017-01-09 MED ORDER — PHENAZOPYRIDINE HCL 100 MG PO TABS
ORAL_TABLET | ORAL | Status: AC
  Filled 2017-01-09: qty 2

## 2017-01-09 MED ORDER — LIDOCAINE 2% (20 MG/ML) 5 ML SYRINGE
INTRAMUSCULAR | Status: DC | PRN
  Administered 2017-01-09: 60 mg via INTRAVENOUS

## 2017-01-09 MED ORDER — DEXAMETHASONE SODIUM PHOSPHATE 4 MG/ML IJ SOLN
INTRAMUSCULAR | Status: DC | PRN
  Administered 2017-01-09: 10 mg via INTRAVENOUS

## 2017-01-09 MED ORDER — PROPOFOL 10 MG/ML IV BOLUS
INTRAVENOUS | Status: AC
  Filled 2017-01-09: qty 40

## 2017-01-09 SURGICAL SUPPLY — 32 items
BAG DRAIN URO-CYSTO SKYTR STRL (DRAIN) ×3 IMPLANT
BASKET LASER NITINOL 1.9FR (BASKET) IMPLANT
BASKET STNLS GEMINI 4WIRE 3FR (BASKET) IMPLANT
BASKET ZERO TIP NITINOL 2.4FR (BASKET) IMPLANT
CARTRIDGE STONEBREAK CO2 KIDNE (ELECTROSURGICAL) IMPLANT
CATH INTERMIT  6FR 70CM (CATHETERS) IMPLANT
CATH URET 5FR 28IN CONE TIP (BALLOONS)
CATH URET 5FR 28IN OPEN ENDED (CATHETERS) IMPLANT
CATH URET 5FR 70CM CONE TIP (BALLOONS) IMPLANT
CLOTH BEACON ORANGE TIMEOUT ST (SAFETY) ×6 IMPLANT
FIBER LASER FLEXIVA 1000 (UROLOGICAL SUPPLIES) IMPLANT
FIBER LASER FLEXIVA 365 (UROLOGICAL SUPPLIES) ×3 IMPLANT
FIBER LASER FLEXIVA 550 (UROLOGICAL SUPPLIES) IMPLANT
FIBER LASER TRAC TIP (UROLOGICAL SUPPLIES) IMPLANT
GLOVE BIO SURGEON STRL SZ8 (GLOVE) ×3 IMPLANT
GOWN STRL REUS W/ TWL XL LVL3 (GOWN DISPOSABLE) ×1 IMPLANT
GOWN STRL REUS W/TWL XL LVL3 (GOWN DISPOSABLE) ×6 IMPLANT
GUIDEWIRE 0.038 PTFE COATED (WIRE) IMPLANT
GUIDEWIRE ANG ZIPWIRE 038X150 (WIRE) IMPLANT
GUIDEWIRE STR DUAL SENSOR (WIRE) IMPLANT
KIT BALLIN UROMAX 15FX10 (LABEL) IMPLANT
KIT BALLN UROMAX 15FX4 (MISCELLANEOUS) IMPLANT
KIT BALLN UROMAX 26 75X4 (MISCELLANEOUS)
KIT RM TURNOVER CYSTO AR (KITS) ×3 IMPLANT
LASER FIBER DISP (UROLOGICAL SUPPLIES) IMPLANT
MANIFOLD NEPTUNE II (INSTRUMENTS) ×3 IMPLANT
PACK CYSTO (CUSTOM PROCEDURE TRAY) ×3 IMPLANT
PROBE PNEUMATIC 1.6MM (ELECTROSURGICAL) IMPLANT
SET HIGH PRES BAL DIL (LABEL)
TUBE CONNECTING 12'X1/4 (SUCTIONS) ×1
TUBE CONNECTING 12X1/4 (SUCTIONS) ×2 IMPLANT
WATER STERILE IRR 500ML POUR (IV SOLUTION) ×3 IMPLANT

## 2017-01-09 NOTE — Discharge Instructions (Signed)
1. You may see some blood in the urine and may have some burning with urination for 48-72 hours. You also may notice that you have to urinate more frequently or urgently after your procedure which is normal.  °2. You should call should you develop an inability urinate, fever > 101, persistent nausea and vomiting that prevents you from eating or drinking to stay hydrated.  °3. If you have a stent, you will likely urinate more frequently and urgently until the stent is removed and you may experience some discomfort/pain in the lower abdomen and flank especially when urinating. You may take pain medication prescribed to you if needed for pain. You may also intermittently have blood in the urine until the stent is removed. °4. If you have a catheter, you will be taught how to take care of the catheter by the nursing staff prior to discharge from the hospital.  You may periodically feel a strong urge to void with the catheter in place.  This is a bladder spasm and most often can occur when having a bowel movement or moving around. It is typically self-limited and usually will stop after a few minutes.  You may use some Vaseline or Neosporin around the tip of the catheter to reduce friction at the tip of the penis. You may also see some blood in the urine.  A very small amount of blood can make the urine look quite red.  As long as the catheter is draining well, there usually is not a problem.  However, if the catheter is not draining well and is bloody, you should call the office ([phone]) to notify us. ° °CYSTOSCOPY HOME CARE INSTRUCTIONS ° °Activity: °Rest for the remainder of the day.  Do not drive or operate equipment today.  You may resume normal activities in one to two days as instructed by your physician.  ° °Meals: °Drink plenty of liquids and eat light foods such as gelatin or soup this evening.  You may return to a normal meal plan tomorrow. ° °Return to Work: °You may return to work in one to two days or  as instructed by your physician. ° °Special Instructions / Symptoms: °Call your physician if any of these symptoms occur: ° ° -persistent or heavy bleeding ° -bleeding which continues after first few urination ° -large blood clots that are difficult to pass ° -urine stream diminishes or stops completely ° -fever equal to or higher than 101 degrees Farenheit. ° -cloudy urine with a strong, foul odor ° -severe pain ° °Females should always wipe from front to back after elimination.  You may feel some burning pain when you urinate.  This should disappear with time.  Applying moist heat to the lower abdomen or a hot tub bath may help relieve the pain. \ ° °Follow-Up / Date of Return Visit to Your Physician:  Call for an appointment to arrange follow-up. ° °Patient Signature:  ________________________________________________________ ° °Nurse's Signature:  ________________________________________________________ ° °Post Anesthesia Home Care Instructions ° °Activity: °Get plenty of rest for the remainder of the day. A responsible adult should stay with you for 24 hours following the procedure.  °For the next 24 hours, DO NOT: °-Drive a car °-Operate machinery °-Drink alcoholic beverages °-Take any medication unless instructed by your physician °-Make any legal decisions or sign important papers. ° °Meals: °Start with liquid foods such as gelatin or soup. Progress to regular foods as tolerated. Avoid greasy, spicy, heavy foods. If nausea and/or vomiting occur, drink only   clear liquids until the nausea and/or vomiting subsides. Call your physician if vomiting continues. ° °Special Instructions/Symptoms: °Your throat may feel dry or sore from the anesthesia or the breathing tube placed in your throat during surgery. If this causes discomfort, gargle with warm salt water. The discomfort should disappear within 24 hours. ° °If you had a scopolamine patch placed behind your ear for the management of post- operative nausea and/or  vomiting: ° °1. The medication in the patch is effective for 72 hours, after which it should be removed.  Wrap patch in a tissue and discard in the trash. Wash hands thoroughly with soap and water. °2. You may remove the patch earlier than 72 hours if you experience unpleasant side effects which may include dry mouth, dizziness or visual disturbances. °3. Avoid touching the patch. Wash your hands with soap and water after contact with the patch. °  ° °

## 2017-01-09 NOTE — Transfer of Care (Signed)
Immediate Anesthesia Transfer of Care Note  Patient: Despina Hick  Procedure(s) Performed: Procedure(s): CYSTOSCOPY WITH LITHOLAPAXY (N/A) HOLMIUM LASER APPLICATION (N/A)  Patient Location: PACU  Anesthesia Type:General  Level of Consciousness: awake, alert , oriented and patient cooperative  Airway & Oxygen Therapy: Patient Spontanous Breathing and Patient connected to nasal cannula oxygen  Post-op Assessment: Report given to RN and Post -op Vital signs reviewed and stable  Post vital signs: Reviewed and stable  Last Vitals:  Vitals:   01/09/17 0625  BP: 127/81  Pulse: 81  Resp: 16  Temp: 36.4 C    Last Pain:  Vitals:   01/09/17 0625  TempSrc: Oral      Patients Stated Pain Goal: 5 ([id number])  Complications: No apparent anesthesia complications

## 2017-01-09 NOTE — H&P (Signed)
Urology History and Physical Exam  CC: Bladder stone  HPI: 76 year old male presents for cystolithalopaxy of an 11 mm bladder stone recently seen on CT performed for hematuria. HE has a h/o gross hematuria. Recent hematuria protocol CT revealed nml upper tracts except for small bilateral renal calculi and an 11 mm bladder stone.  PMH: Past Medical History:  Diagnosis Date  . Allergy   . Angioedema   . Arthritis   . 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  [facility].  . Bladder stone   . Cancer ([place])    skin cancer multiple; followed by [facility]/dermatology yearly.  . Cataract    4 YEARS - BILATERAL  . Colon polyps     Previous polyps on colonoscopy.  [facility].    . Hiatal hernia 10/20/2013   EGD confirmed.  [facility].  . Hyperlipemia   . Hypertension    no meds  . Neuromuscular disorder (HCC)    numbness rt hand  . Seasonal allergies   . Sleep apnea    w CPAP x 3 months    PSH: Past Surgical History:  Procedure Laterality Date  . BLADDER STONE REMOVAL  2011  . CARPAL TUNNEL RELEASE  01/17/2012   Procedure: CARPAL TUNNEL RELEASE;  Surgeon: Cammie Sickle., MD;  Location: [place];  Service: Orthopedics;  Laterality: Right;  . [place]  . COLONOSCOPY  10/20/2013   [facility].  Polyps.   . ESOPHAGOGASTRODUODENOSCOPY  10/20/2013   [facility]; biopsy of area concernig for Barrett's; gastritis.  Marland Kitchen EYE SURGERY  2012   both cataracts  . FRACTURE SURGERY     3 fingers  . QUADRICEPS TENDON REPAIR  2003   bilateral  . ULNAR NERVE TRANSPOSITION  01/17/2012   Procedure: ULNAR NERVE DECOMPRESSION/TRANSPOSITION;  Surgeon: Cammie Sickle., MD;  Location: [place];  Service: Orthopedics;  Laterality: Right;  decompression of ulnar nerve only  at right cubital tunnel  . UPPER GASTROINTESTINAL ENDOSCOPY    . VASECTOMY      Allergies: Allergies  Allergen Reactions  . Ace Inhibitors Cough  . Amoxicillin Nausea And Vomiting  . Darvon     Chest  pain  . Toprol Xl [Metoprolol Tartrate]     Medications: Prescriptions Prior to Admission  Medication Sig Dispense Refill Last Dose  . Ascorbic Acid (VITAMIN C PO) Take 1 capsule by mouth daily.   Past Week at Unknown time  . atorvastatin (LIPITOR) 20 MG tablet Take 0.5 tablets (10 mg total) by mouth daily. 90 tablet 1 01/08/2017 at Unknown time  . cetirizine (ZYRTEC) 10 MG tablet Take 1 tablet (10 mg total) by mouth at bedtime. (Patient taking differently: Take 10 mg by mouth daily. ) 90 tablet 3 01/08/2017 at Unknown time  . fish oil-omega-3 fatty acids 1000 MG capsule Take 1 g by mouth 2 (two) times daily.    Past Week at Unknown time  . fluticasone (FLONASE) 50 MCG/ACT nasal spray Place 2 sprays into both nostrils daily. AS NEEDED 16 g 11 01/08/2017 at Unknown time  . GARLIC PO Take [id number] mg by mouth 2 (two) times daily.    Past Week at Unknown time  . losartan (COZAAR) 50 MG tablet Take 1 tablet (50 mg total) by mouth daily. 90 tablet 3 01/08/2017 at Unknown time  . Misc Natural Products (GLUCOSAMINE CHOND COMPLEX/MSM PO) Take by mouth.   Past Week at Unknown time  . Multiple Vitamin (MULTIVITAMIN) tablet  Take 1 tablet by mouth daily.   Past Week at Unknown time  . Naproxen Sodium (ALEVE PO) Take by mouth as needed.   Past Month at Unknown time  . saw palmetto 160 MG capsule Take 160 mg by mouth 2 (two) times daily.   Past Week at Unknown time  . ciprofloxacin (CIPRO) 500 MG tablet Take 1 tablet (500 mg total) by mouth 2 (two) times daily. 20 tablet 0 Unknown at Unknown time     Social History: Social History   Social History  . Marital status: Married    Spouse name: N/A  . Number of children: 2  . Years of education: 12   Occupational History  . Retired    Social History Main Topics  . Smoking status: Former Smoker    Packs/day: 2.00    Years: 15.00    Types: Cigarettes    Quit date: 01/11/1974  . Smokeless tobacco: Never Used  . Alcohol use No     Comment: occ  . Drug use: No   . Sexual activity: No   Other Topics Concern  . Not on file   Social History Narrative   Marital status:  Married x 42 years      Children: 2 daughters; 3 grandchildren.      Employment; retired in 1998 from [employer]; retired age 60 from [employer] after 14 years.      Lives: with wife.      Tobacco: former smoker; quit at age 5.  Smoked 15 years.      Alcohol:  On vacation or special occasions.        Education: [employer].       Exercise: Gym 3-4 times a week for 2 hours.; exercises 7 days per week.   Elliptical for one hour.      Advanced Directives: none; +desires FULL CODE.        ADLs: independent with ADLs; drives.  Does not walk with assistant devices.     Drinks 3-4 caffeine drinks a day     Family History: Family History  Problem Relation Age of Onset  . Cirrhosis Father   . Hypertension Father   . Alcohol abuse Father   . Heart failure Mother     in 72s  . Arthritis Brother     Rheumatoid arthritis  . Hypertension Brother     Review of Systems: Positive: Gross hematuria, LUTS Negative:   A further 10 point review of systems was negative except what is listed in the HPI.                  Physical Exam: @VITALS2 @ General: No acute distress.  Awake. Head:  Normocephalic.  Atraumatic. ENT:  EOMI.  Mucous membranes moist Neck:  Supple.  No lymphadenopathy. CV:  S1 present. S2 present. Regular rate. Pulmonary: Equal effort bilaterally.  Clear to auscultation bilaterally. Abdomen: Soft.  Non tender to palpation. Skin:  Normal turgor.  No visible rash. Extremity: No gross deformity of bilateral upper extremities.  No gross deformity of                             lower extremities. Neurologic: Alert. Appropriate mood.    Studies:  Recent Labs     01/09/17  0653  HGB  14.3    Recent Labs     01/09/17  0653  NA  140  K  4.1  No results for input(s): INR, APTT in the last 72 hours.  Invalid input(s): PT   Invalid  input(s): ABG    Assessment:  11 mm bladder stone  Plan: Cystolithalopaxy

## 2017-01-09 NOTE — Op Note (Signed)
Preoperative diagnosis: 11 millimeter bladder calculus  Postoperative diagnosis: Same  Principal procedure: Cystolitholapaxy of 11 millimeter bladder calculus.  Surgeon: Diona Fanti  Anesthesia: Gen. with LMA  Complications: None  Drains: None  Specimen: Bladder stone, for in office stone analysis.  Indications: 76 year old male with 11 millimeter bladder calculus, recently found on evaluation for gross hematuria.  The patient does have BPH with minimal symptomatology.  His upper tracts revealed no evidence of pathology.  He presents at this time for cystolitholapaxy, for treatment of this symptomatic bladder stone.  Findings: Urethra was normal   upon cystoscopy.  Prostatic urethra was minimally obstructive with trilobar hypertrophy.  The bladder revealed generalized mild trabeculations.  The previously mentioned stone was evident layering posteriorly.  There were no urothelial lesions.  Ureteral orifices were normal.  Description of procedure: The patient was properly identified in the holding area.  He is taken the operating room where general anesthesia was administered with the LMA.  He received preoperative IV Cipro.  Proper timeout was performed following adequate prep and drape with the patient in the lithotomy position.  A 21 French panendoscope was advanced to the bladder.  Previously mentioned findings were noted.  The bladder stone was identified.  A 365 micron fiber was then introduced through the scope.  Using holmium laser energy the stone was fragmented into multiple smaller fragments which were easily irrigated from the bladder and saved for stone analysis.  Small dust fragments were irrigated out using the [employer] syringe.  Inspection this point revealed no bleeding, no urothelial trauma and no remaining stone material.  Approximately 150 mL of water was left in the bladder and the scope was removed.  The patient was then awakened and taken to PACU in stable condition.  He  tolerated the procedure well.

## 2017-01-09 NOTE — Anesthesia Postprocedure Evaluation (Signed)
Anesthesia Post Note  Patient: Todd [name]  Procedure(s) Performed: Procedure(s) (LRB): CYSTOSCOPY WITH LITHOLAPAXY (N/A) HOLMIUM LASER APPLICATION (N/A)  Patient location during evaluation: PACU Anesthesia Type: General Level of consciousness: awake and alert Pain management: pain level controlled Vital Signs Assessment: post-procedure vital signs reviewed and stable Respiratory status: spontaneous breathing, nonlabored ventilation, respiratory function stable and patient connected to nasal cannula oxygen Cardiovascular status: blood pressure returned to baseline and stable Postop Assessment: no signs of nausea or vomiting Anesthetic complications: no       Last Vitals:  Vitals:   01/09/17 0900 01/09/17 0915  BP: 116/71 133/73  Pulse: 69 67  Resp: 15 14  Temp:      Last Pain:  Vitals:   01/09/17 0625  TempSrc: Leatrice Jewels                 Tiajuana Amass

## 2017-01-09 NOTE — Anesthesia Preprocedure Evaluation (Addendum)
Anesthesia Evaluation  Patient identified by MRN, date of birth, ID band Patient awake    Airway Mallampati: II  TM Distance: >3 FB Neck ROM: Full    Dental  (+) Teeth Intact, Dental Advisory Given, Implants,    Pulmonary sleep apnea and Continuous Positive Airway Pressure Ventilation , former smoker,    Pulmonary exam normal        Cardiovascular Exercise Tolerance: Good hypertension, Pt. on medications  Rhythm:Regular Rate:Normal     Neuro/Psych    GI/Hepatic hiatal hernia,   Endo/Other    Renal/GU      Musculoskeletal  (+) Arthritis , Osteoarthritis,    Abdominal   Peds  Hematology   Anesthesia Other Findings   Reproductive/Obstetrics                           Lab Results  Component Value Date   WBC 5.5 08/23/2016   HGB 14.3 01/09/2017   HCT 42.0 01/09/2017   MCV 84.9 08/23/2016   PLT 233 08/23/2016   Lab Results  Component Value Date   CREATININE 0.89 08/23/2016   BUN 19 08/23/2016   NA 140 01/09/2017   K 4.1 01/09/2017   CL 100 08/23/2016   CO2 25 08/23/2016    Anesthesia Physical Anesthesia Plan  ASA: II  Anesthesia Plan: General   Post-op Pain Management:    Induction: Intravenous  Airway Management Planned: LMA  Additional Equipment:   Intra-op Plan:   Post-operative Plan: Extubation in OR  Informed Consent: I have reviewed the patients History and Physical, chart, labs and discussed the procedure including the risks, benefits and alternatives for the proposed anesthesia with the patient or authorized representative who has indicated his/her understanding and acceptance.   Dental advisory given  Plan Discussed with: CRNA  Anesthesia Plan Comments:         Anesthesia Quick Evaluation

## 2017-01-09 NOTE — Anesthesia Procedure Notes (Signed)
Procedure Name: LMA Insertion Date/Time: 01/09/2017 8:10 AM Performed by: Wanita Chamberlain Pre-anesthesia Checklist: Patient identified, Timeout performed, Emergency Drugs available, Suction available and Patient being monitored Patient Re-evaluated:Patient Re-evaluated prior to inductionOxygen Delivery Method: Circle system utilized Preoxygenation: Pre-oxygenation with 100% oxygen Intubation Type: IV induction Ventilation: Mask ventilation without difficulty LMA: LMA inserted LMA Size: 4.0 Number of attempts: 1 Placement Confirmation: breath sounds checked- equal and bilateral and CO2 detector Tube secured with: Tape Dental Injury: Teeth and Oropharynx as per pre-operative assessment

## 2017-01-09 NOTE — Addendum Note (Signed)
Addendum  created 01/09/17 1027 by Wanita Chamberlain, CRNA   Charge Capture section accepted

## 2017-01-10 ENCOUNTER — Encounter (HOSPITAL_BASED_OUTPATIENT_CLINIC_OR_DEPARTMENT_OTHER): Payer: Self-pay | Admitting: Urology

## 2017-01-26 ENCOUNTER — Telehealth: Payer: Self-pay | Admitting: Family Medicine

## 2017-01-26 ENCOUNTER — Ambulatory Visit (INDEPENDENT_AMBULATORY_CARE_PROVIDER_SITE_OTHER): Payer: Medicare Other | Admitting: Adult Health

## 2017-01-26 ENCOUNTER — Encounter: Payer: Self-pay | Admitting: Adult Health

## 2017-01-26 VITALS — HR 90 | Resp 20 | Wt 234.0 lb

## 2017-01-26 DIAGNOSIS — G4733 Obstructive sleep apnea (adult) (pediatric): Secondary | ICD-10-CM | POA: Diagnosis not present

## 2017-01-26 DIAGNOSIS — Z9989 Dependence on other enabling machines and devices: Secondary | ICD-10-CM

## 2017-01-26 DIAGNOSIS — H918X3 Other specified hearing loss, bilateral: Secondary | ICD-10-CM

## 2017-01-26 NOTE — Telephone Encounter (Signed)
Pt is calling requesting a referral to [facility]. Has upcoming apt with Dr Tamala Julian 02/21/17.

## 2017-01-26 NOTE — Patient Instructions (Signed)
We will increase pressure to 13 cm h20 Continue using cpap nightly If your symptoms worsen or you develop new symptoms please let us know.

## 2017-01-26 NOTE — Progress Notes (Addendum)
PATIENT: Todd [name] DOB: [birth year]  REASON FOR VISIT: follow up- OSA on cpap HISTORY FROM: patient  HISTORY OF PRESENT ILLNESS: Today 01/26/17: Todd [name] is a 76 year old male with a history of obstructive sleep apnea on CPAP. He returns today for follow-up. His download indicates that he uses machine every night for a compliance of 90%. Every night he uses machine greater than 4 hours. On average he uses his machine 7 hours and 44 minutes. He is on a pressure of 10 cm water with EPR of 3. His residual AHI remains high at 33.8 events per hour. He does not have a significant leak. He reports that the machine has made him feel better. He reports that he is tolerating it well. He denies any new neurological symptoms. He returns today for an evaluation.  HISTORY per Dr. Guadelupe Sabin notes:  Todd [name] is a 76 year old right-handed gentleman with an underlying medical history of hypertension, history of arthritis, s/p b/l knee surgeries in 2003, skin cancer, cataract, status post repairs, hiatal hernia, hyperlipidemia, history of angioedema, and obesity, who presents for follow-up consultation of his obstructive sleep apnea, after his split-night sleep study, and now on CPAP therapy. The patient is unaccompanied today. I first met him on 08/09/2016 at the request of his primary care physician, at which time he reported snoring and excessive daytime somnolence as well as worsening nocturia. I invited him for sleep study. He had a split-night sleep study on 08/21/2016. We went over his test results today. Baseline sleep efficiency was markedly reduced at 50.8%, wake after sleep onset was 83 minutes, REM latency was normal, sleep latency delayed. Total AHI was 28.8 per hour, REM AHI was 67.5 per hour, O2 nadir was 83%.  He was titrated on CPAP from 5 cm to 8 cm, AHI was 0 per hour on a pressure of 8 cm. He had severe PLMS during the baseline and the CPAP titration portion of the study with minimal to mild  arousals.  Today, 11/28/2016: I reviewed his CPAP compliance data from 10/29/2016 through 11/27/2016, which is a total of 30 days, during which time he used his CPAP every night with percent used days greater than 4 hours at 100%, indicating superb compliance with an average usage of 8 hours and 11 minutes, residual AHI elevated at 28.7 per hour, mostly obstructive in nature, leak low with the 95th percentile at 7.2 L/m on a pressure of 8 cm with EPR of 3.  Today, 11/28/2016: He reports doing well with CPAP, he has adjusted well to treatment. He feels improved with his EDS, AM HAs, FFM is well tolerated, nocturia better.   Previously:   08/21/2016: He reports snoring and excessive daytime somnolence. His Epworth sleepiness score is 14 out of 24 today. Fatigue score is 15 out of 63. Snoring can be loud. He denies restless leg symptoms. He has had nocturia for years, worse in the past year with 3-4 bathroom visits per night. He has seen urologist for this was advised that his prostate looked fine. He quit smoking in 1973, drinks alcohol occasionally, and drinks about 3 cups of coffee per day but typically no sodas and decaf tea only. I reviewed your office note from 07/06/2016. He retired from the [employer] in 57 and then did 14 years for the [employer]. [employer], retired in 2012. He tries to exercise regularly, and tries to drink enough water. He has gained weight in the last few years, especially after his retirement.  He  does not watch TV in bed, lives with his wife, has 2 grown daughters, one in [place] and the other in [place], [place]. He suspects that his father may have had obstructive sleep apnea. He tries to keep a scheduled for his sleep time and wake up time. They have no pets at this time. He likes to travel since his retirement.  REVIEW OF SYSTEMS: Out of a complete 14 system review of symptoms, the patient complains only of the following symptoms, and all other reviewed systems are  negative.  Joint pain, hearing loss, ringing in ears  ALLERGIES: Allergies  Allergen Reactions  . Ace Inhibitors Cough  . Amoxicillin Nausea And Vomiting  . Darvon     Chest pain  . Toprol Xl [Metoprolol Tartrate]     HOME MEDICATIONS: Outpatient Medications Prior to Visit  Medication Sig Dispense Refill  . Ascorbic Acid (VITAMIN C PO) Take 1 capsule by mouth daily.    Marland Kitchen atorvastatin (LIPITOR) 20 MG tablet Take 0.5 tablets (10 mg total) by mouth daily. 90 tablet 1  . cetirizine (ZYRTEC) 10 MG tablet Take 1 tablet (10 mg total) by mouth at bedtime. (Patient taking differently: Take 10 mg by mouth daily. ) 90 tablet 3  . fish oil-omega-3 fatty acids 1000 MG capsule Take 1 g by mouth 2 (two) times daily.     . fluticasone (FLONASE) 50 MCG/ACT nasal spray Place 2 sprays into both nostrils daily. AS NEEDED 16 g 11  . GARLIC PO Take 818 mg by mouth 2 (two) times daily.     Marland Kitchen losartan (COZAAR) 50 MG tablet Take 1 tablet (50 mg total) by mouth daily. 90 tablet 3  . Misc Natural Products (GLUCOSAMINE CHOND COMPLEX/MSM PO) Take by mouth.    . Multiple Vitamin (MULTIVITAMIN) tablet Take 1 tablet by mouth daily.    . Naproxen Sodium (ALEVE PO) Take by mouth as needed.    . saw palmetto 160 MG capsule Take 160 mg by mouth 2 (two) times daily.    . ciprofloxacin (CIPRO) 500 MG tablet Take 1 tablet (500 mg total) by mouth 2 (two) times daily. 20 tablet 0  . phenazopyridine (PYRIDIUM) 200 MG tablet Take 1 tablet (200 mg total) by mouth 3 (three) times daily as needed for pain. 10 tablet 0   No facility-administered medications prior to visit.     PAST MEDICAL HISTORY: Past Medical History:  Diagnosis Date  . Allergy   . Angioedema   . Arthritis   . 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  [facility].  . Bladder stone   . Cancer ([place])    skin cancer multiple; followed by [facility]/dermatology yearly.  . Cataract    4 YEARS - BILATERAL  . Colon polyps     Previous polyps on colonoscopy.   [facility].    . Hiatal hernia 10/20/2013   EGD confirmed.  [facility].  . Hyperlipemia   . Hypertension    no meds  . Neuromuscular disorder (HCC)    numbness rt hand  . Seasonal allergies   . Sleep apnea    w CPAP x 3 months    PAST SURGICAL HISTORY: Past Surgical History:  Procedure Laterality Date  . BLADDER STONE REMOVAL  2011, 2018  . CARPAL TUNNEL RELEASE  01/17/2012   Procedure: CARPAL TUNNEL RELEASE;  Surgeon: Cammie Sickle., MD;  Location: [facility];  Service: Orthopedics;  Laterality: Right;  . [place]  . COLONOSCOPY  10/20/2013   [facility].  Polyps.   . CYSTOSCOPY WITH LITHOLAPAXY N/A 01/09/2017   Procedure: CYSTOSCOPY WITH LITHOLAPAXY;  Surgeon: Franchot Gallo, MD;  Location: [facility];  Service: Urology;  Laterality: N/A;  . ESOPHAGOGASTRODUODENOSCOPY  10/20/2013   [facility]; biopsy of area concernig for Barrett's; gastritis.  Marland Kitchen EYE SURGERY  2012   both cataracts  . FRACTURE SURGERY     3 fingers  . HOLMIUM LASER APPLICATION N/A 07/15/3381   Procedure: HOLMIUM LASER APPLICATION;  Surgeon: Franchot Gallo, MD;  Location: [facility];  Service: Urology;  Laterality: N/A;  . QUADRICEPS TENDON REPAIR  2003   bilateral  . ULNAR NERVE TRANSPOSITION  01/17/2012   Procedure: ULNAR NERVE DECOMPRESSION/TRANSPOSITION;  Surgeon: Cammie Sickle., MD;  Location: [facility];  Service: Orthopedics;  Laterality: Right;  decompression of ulnar nerve only  at right cubital tunnel  . UPPER GASTROINTESTINAL ENDOSCOPY    . VASECTOMY      FAMILY HISTORY: Family History  Problem Relation Age of Onset  . Cirrhosis Father   . Hypertension Father   . Alcohol abuse Father   . Heart failure Mother     in 61s  . Arthritis Brother     Rheumatoid arthritis  . Hypertension Brother     SOCIAL HISTORY: Social History   Social History  . Marital status: Married    Spouse name: N/A  . Number of children: 2  . Years  of education: 12   Occupational History  . Retired    Social History Main Topics  . Smoking status: Former Smoker    Packs/day: 2.00    Years: 15.00    Types: Cigarettes    Quit date: 01/11/1974  . Smokeless tobacco: Never Used  . Alcohol use No     Comment: occ  . Drug use: No  . Sexual activity: No   Other Topics Concern  . Not on file   Social History Narrative   Marital status:  Married x 42 years      Children: 2 daughters; 3 grandchildren.      Employment; retired in 1998 from [employer]; retired age 76 from [employer] after 14 years.      Lives: with wife.      Tobacco: former smoker; quit at age 6.  Smoked 15 years.      Alcohol:  On vacation or special occasions.        Education: [employer].       Exercise: Gym 3-4 times a week for 2 hours.; exercises 7 days per week.   Elliptical for one hour.      Advanced Directives: none; +desires FULL CODE.        ADLs: independent with ADLs; drives.  Does not walk with assistant devices.     Drinks 3-4 caffeine drinks a day       PHYSICAL EXAM  Vitals:   01/26/17 0803  BP: 128/78  Pulse: 90  Resp: 20  Weight: 234 lb (106.1 kg)   Body mass index is 32.64 kg/m.  Generalized: Well developed, in no acute distress   Neurological examination  Mentation: Alert oriented to time, place, history taking. Follows all commands speech and language fluent Cranial nerve II-XII: Pupils were equal round reactive to light. Extraocular movements were full, visual field were full on confrontational test. Facial sensation and strength were normal. Uvula tongue midline. Head turning and shoulder shrug  were normal and symmetric. Neck circumference  17.75 inches. Mallampati 2+ Motor: The motor testing reveals 5 over 5 strength of all 4 extremities. Good symmetric motor tone is noted throughout.  Sensory: Sensory testing is intact to soft touch on all 4 extremities. No evidence of extinction is noted.  Coordination:  Cerebellar testing reveals good finger-nose-finger and heel-to-shin bilaterally.  Gait and station: Gait is normal.  Reflexes: Deep tendon reflexes are symmetric and normal bilaterally.   DIAGNOSTIC DATA (LABS, IMAGING, TESTING) - I reviewed patient records, labs, notes, testing and imaging myself where available.  Lab Results  Component Value Date   WBC 5.5 08/23/2016   HGB 14.3 01/09/2017   HCT 42.0 01/09/2017   MCV 84.9 08/23/2016   PLT 233 08/23/2016      Component Value Date/Time   NA 140 01/09/2017 0653   K 4.1 01/09/2017 0653   CL 100 08/23/2016 1016   CO2 25 08/23/2016 1016   GLUCOSE 124 (H) 01/09/2017 0653   BUN 19 08/23/2016 1016   CREATININE 0.89 08/23/2016 1016   CALCIUM 9.2 08/23/2016 1016   PROT 7.0 08/23/2016 1016   ALBUMIN 4.4 08/23/2016 1016   AST 33 08/23/2016 1016   ALT 42 08/23/2016 1016   ALKPHOS 79 08/23/2016 1016   BILITOT 1.0 08/23/2016 1016   GFRNONAA 73 (L) 03/12/2013 0545   GFRAA 85 (L) 03/12/2013 0545   Lab Results  Component Value Date   CHOL 152 08/23/2016   HDL 39 (L) 08/23/2016   LDLCALC 73 08/23/2016   TRIG 201 (H) 08/23/2016   CHOLHDL 3.9 08/23/2016   Lab Results  Component Value Date   HGBA1C 6.0 (H) 03/12/2013   No results found for: [id number] Lab Results  Component Value Date   TSH 1.755 06/18/2012      ASSESSMENT AND PLAN 76 y.o. year old male  has a past medical history of Allergy; Angioedema; Arthritis; Barrett esophagus (11/07/2008); Bladder stone; Cancer ([place]); Cataract; Colon polyps; Hiatal hernia (10/20/2013); Hyperlipemia; Hypertension; Neuromuscular disorder ([place]); Seasonal allergies; and Sleep apnea. here with:  1. Obstructive sleep apnea on CPAP  The patient's download indicates excellent compliance however his apnea index remains elevated. I consulted with Dr. Rexene Alberts, we will increase his pressure 13 cm of water with EPR of 3. He will follow-up in 2 months for another download or sooner if needed.  I spent 15  minutes with the patient 50% of this time was spent reviewing his CPAP download.  Ward Givens, MSN, NP-C 01/26/2017, 8:11 AM [facility] [address] [place], [place] [address] [phone]  I reviewed the above note and documentation by the Nurse Practitioner and agree with the history, physical exam, assessment and plan as outlined above. I was immediately available for face-to-face consultation. Star Age, MD, PhD [facility])

## 2017-01-27 NOTE — Telephone Encounter (Signed)
I placed a referral for audiology; please confirm with patient --- this is for gradual onset of hearing loss?  He has not had SUDDEN hearing loss?

## 2017-01-30 NOTE — Telephone Encounter (Signed)
Excellent.  I wanted to confirm that nothing acute or sudden had occurred in the recent few days/weeks.

## 2017-01-30 NOTE — Telephone Encounter (Signed)
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 Thanks!

## 2017-02-21 ENCOUNTER — Ambulatory Visit (INDEPENDENT_AMBULATORY_CARE_PROVIDER_SITE_OTHER): Payer: Medicare Other | Admitting: Family Medicine

## 2017-02-21 ENCOUNTER — Encounter: Payer: Self-pay | Admitting: Family Medicine

## 2017-02-21 VITALS — BP 129/80 | HR 72 | Temp 97.7°F | Resp 16 | Ht 71.0 in | Wt 224.6 lb

## 2017-02-21 DIAGNOSIS — I10 Essential (primary) hypertension: Secondary | ICD-10-CM

## 2017-02-21 DIAGNOSIS — M25541 Pain in joints of right hand: Secondary | ICD-10-CM

## 2017-02-21 DIAGNOSIS — Z6831 Body mass index (BMI) 31.0-31.9, adult: Secondary | ICD-10-CM | POA: Diagnosis not present

## 2017-02-21 DIAGNOSIS — M19042 Primary osteoarthritis, left hand: Secondary | ICD-10-CM

## 2017-02-21 DIAGNOSIS — W57XXXA Bitten or stung by nonvenomous insect and other nonvenomous arthropods, initial encounter: Secondary | ICD-10-CM | POA: Diagnosis not present

## 2017-02-21 DIAGNOSIS — M19041 Primary osteoarthritis, right hand: Secondary | ICD-10-CM

## 2017-02-21 DIAGNOSIS — Z9989 Dependence on other enabling machines and devices: Secondary | ICD-10-CM

## 2017-02-21 DIAGNOSIS — N210 Calculus in bladder: Secondary | ICD-10-CM | POA: Diagnosis not present

## 2017-02-21 DIAGNOSIS — M25542 Pain in joints of left hand: Secondary | ICD-10-CM

## 2017-02-21 DIAGNOSIS — N200 Calculus of kidney: Secondary | ICD-10-CM

## 2017-02-21 DIAGNOSIS — H903 Sensorineural hearing loss, bilateral: Secondary | ICD-10-CM

## 2017-02-21 DIAGNOSIS — G4733 Obstructive sleep apnea (adult) (pediatric): Secondary | ICD-10-CM | POA: Diagnosis not present

## 2017-02-21 DIAGNOSIS — J301 Allergic rhinitis due to pollen: Secondary | ICD-10-CM

## 2017-02-21 DIAGNOSIS — E7800 Pure hypercholesterolemia, unspecified: Secondary | ICD-10-CM | POA: Diagnosis not present

## 2017-02-21 MED ORDER — ATORVASTATIN CALCIUM 20 MG PO TABS
10.0000 mg | ORAL_TABLET | Freq: Every day | ORAL | 3 refills | Status: DC
Start: 2017-02-21 — End: 2018-02-26

## 2017-02-21 NOTE — Progress Notes (Signed)
Subjective:    Patient ID: Todd [name], male    DOB: September 06, 1941, 76 y.o.   MRN: [id number]  02/21/2017  Follow-up (blood pressure and high cholesterol)   HPI This 76 y.o. male presents for six month follow-up of hypertension, hypercholesterolemia, allergic rhinitis.  Patient reports good compliance with medication, good tolerance to medication, and good symptom control.  Home BP running 115-128/70-78. Exercising six days per week; trying to decrease triglycerides.  Resistant training every third day.  Taking Fish Oil twice daily 1200mg  one bid.  s/p sleep study; using CPAP; no longer snoring.  Will get out of adjustments during the night.  Falls asleep immeidately.  No changes.  Hearing Loss: purchased hearing aides; very happy with those.  Bladder stone: recurrent; presented with gross hematuria; two remaining kidney stones in L kidney; +calcium stones.  Stopped taking Vitamin C.  No family hx of kidney stones.   Joint pains: has suffered with horrible arthritis of B hands; also has suffered two recent tick bites and joint pain has worsened.  Worried about tick related illness contributing with worsening arthralgias.   Immunization History  Administered Date(s) Administered  . Influenza,inj,Quad PF,36+ Mos 07/15/2013, 07/21/2014, 07/07/2015, 07/06/2016  . Pneumococcal Conjugate-13 08/11/2014  . Pneumococcal Polysaccharide-23 03/10/2008, 08/23/2016  . Tdap 06/14/2010  . Zoster 11/07/2010   BP Readings from Last 3 Encounters:  02/21/17 129/80  01/09/17 129/73  11/28/16 (!) 144/70   Wt Readings from Last 3 Encounters:  02/21/17 224 lb 9.6 oz (101.9 kg)  01/26/17 234 lb (106.1 kg)  01/09/17 228 lb (103.4 kg)    Review of Systems  Constitutional: Negative for activity change, appetite change, chills, diaphoresis, fatigue and fever.  Eyes: Negative for visual disturbance.  Respiratory: Negative for cough and shortness of breath.   Cardiovascular: Negative for chest pain,  palpitations and leg swelling.  Endocrine: Negative for cold intolerance, heat intolerance, polydipsia, polyphagia and polyuria.  Genitourinary: Negative for discharge, dysuria, enuresis, flank pain, frequency, genital sores, hematuria, penile pain, penile swelling, scrotal swelling, testicular pain and urgency.  Musculoskeletal: Positive for arthralgias and joint swelling.  Skin: Positive for wound. Negative for color change, pallor and rash.  Neurological: Negative for dizziness, tremors, seizures, syncope, facial asymmetry, speech difficulty, weakness, light-headedness, numbness and headaches.    Past Medical History:  Diagnosis Date  . Allergy   . Angioedema   . Arthritis   . 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  [facility].  . Bladder stone   . Cancer (HCC)    skin cancer multiple; followed by [facility]/dermatology yearly.  . Cataract    4 YEARS - BILATERAL  . Colon polyps     Previous polyps on colonoscopy.  [facility].    . Hiatal hernia 10/20/2013   EGD confirmed.  [facility].  . Hyperlipemia   . Hypertension    no meds  . Neuromuscular disorder (HCC)    numbness rt hand  . Seasonal allergies   . Sleep apnea    w CPAP x 3 months   Past Surgical History:  Procedure Laterality Date  . BLADDER STONE REMOVAL  2011, 2018  . CARPAL TUNNEL RELEASE  01/17/2012   Procedure: CARPAL TUNNEL RELEASE;  Surgeon: [name]., MD;  Location: [facility];  Service: Orthopedics;  Laterality: Right;  . CERVICAL FUSION  1976  . COLONOSCOPY  10/20/2013   [facility].  Polyps.   . CYSTOSCOPY WITH LITHOLAPAXY N/A 01/09/2017   Procedure: CYSTOSCOPY WITH LITHOLAPAXY;  Surgeon: Marcine Matar, MD;  [facility];  Service: Urology;  Laterality: N/A;  . ESOPHAGOGASTRODUODENOSCOPY  10/20/2013   [facility]; biopsy of area concernig for Barrett's; gastritis.  Marland Kitchen EYE SURGERY  2012   both cataracts  . FRACTURE SURGERY     3 fingers  . HOLMIUM LASER APPLICATION N/A 01/09/2017    Procedure: HOLMIUM LASER APPLICATION;  Surgeon: Marcine Matar, MD;  Location[facility];  Service: Urology;  Laterality: N/A;  . QUADRICEPS TENDON REPAIR  2003   bilateral  . ULNAR NERVE TRANSPOSITION  01/17/2012   Procedure: ULNAR NERVE DECOMPRESSION/TRANSPOSITION;  Surgeon: [name]., MD;  Location: [facility];  Service: Orthopedics;  Laterality: Right;  decompression of ulnar nerve only  at right cubital tunnel  . UPPER GASTROINTESTINAL ENDOSCOPY    . VASECTOMY     Allergies  Allergen Reactions  . Ace Inhibitors Cough  . Amoxicillin Nausea And Vomiting  . Darvon     Chest pain  . Toprol Xl [Metoprolol Tartrate]     Social History   Social History  . Marital status: Married    Spouse name: N/A  . Number of children: 2  . Years of education: 12   Occupational History  . Retired    Social History Main Topics  . Smoking status: Former Smoker    Packs/day: 2.00    Years: 15.00    Types: Cigarettes    Quit date: 01/11/1974  . Smokeless tobacco: Never Used  . Alcohol use No     Comment: occ  . Drug use: No  . Sexual activity: No   Other Topics Concern  . Not on file   Social History Narrative   Marital status:  Married x 42 years      Children: 2 daughters; 3 grandchildren.      Employment; retired in 1998 from [employer]; retired age 24 from [employer] after 14 years.      Lives: with wife.      Tobacco: former smoker; quit at age 89.  Smoked 15 years.      Alcohol:  On vacation or special occasions.        Education: [employer].       Exercise: Gym 3-4 times a week for 2 hours.; exercises 7 days per week.   Elliptical for one hour.      Advanced Directives: none; +desires FULL CODE.        ADLs: independent with ADLs; drives.  Does not walk with assistant devices.     Drinks 3-4 caffeine drinks a day    Family History  Problem Relation Age of Onset  . Cirrhosis Father   . Hypertension Father   .  Alcohol abuse Father   . Heart failure Mother     in 38s  . Arthritis Brother     Rheumatoid arthritis  . Hypertension Brother        Objective:    BP 129/80 (BP Location: Right Arm, Patient Position: Sitting, Cuff Size: Large)   Pulse 72   Temp 97.7 F (36.5 C) (Oral)   Resp 16   Ht 5\' 11"  (1.803 m)   Wt 224 lb 9.6 oz (101.9 kg)   SpO2 96%   BMI 31.33 kg/m  Physical Exam  Constitutional: He is oriented to person, place, and time. He appears well-developed and well-nourished. No distress.  HENT:  Head: Normocephalic and atraumatic.  Right Ear: External ear normal.  Left  Ear: External ear normal.  Nose: Nose normal.  Mouth/Throat: Oropharynx is clear and moist.  Eyes: Conjunctivae and EOM are normal. Pupils are equal, round, and reactive to light.  Neck: Normal range of motion. Neck supple. Carotid bruit is not present. No thyromegaly present.  Cardiovascular: Normal rate, regular rhythm, normal heart sounds and intact distal pulses.  Exam reveals no gallop and no friction rub.   No murmur heard. Pulmonary/Chest: Effort normal and breath sounds normal. He has no wheezes. He has no rales.  Abdominal: Soft. Bowel sounds are normal. He exhibits no distension and no mass. There is no tenderness. There is no rebound and no guarding.  Musculoskeletal:  Arthritic joint changes diffusely of IP joints B fingers.  Lymphadenopathy:    He has no cervical adenopathy.  Neurological: He is alert and oriented to person, place, and time. No cranial nerve deficit.  Skin: Skin is warm and dry. No rash noted. He is not diaphoretic.  Psychiatric: He has a normal mood and affect. His behavior is normal.  Nursing note and vitals reviewed.  Fall Risk  02/21/2017 01/26/2017 10/27/2016 07/06/2016 03/01/2016  Falls in the past year? No No No No No  Number falls in past yr: - - - - -  Injury with Fall? - - - - -   Depression screen [facility] 2/9 02/21/2017 10/27/2016 07/06/2016 03/01/2016 11/25/2015    Decreased Interest 0 0 0 0 0  Down, Depressed, Hopeless 0 0 0 0 0  PHQ - 2 Score 0 0 0 0 0        Assessment & Plan:   1. Essential hypertension, benign   2. Seasonal allergic rhinitis due to pollen   3. Primary osteoarthritis of both hands   4. Pure hypercholesterolemia   5. BMI 31.0-31.9,adult   6. Tick bite, initial encounter   7. Arthralgia of both hands   8. Bladder stone   9. Nephrolithiasis   10. Sensorineural hearing loss (SNHL) of both ears   11. OSA on CPAP    -new onset tick bites with joint pains; obtain Lyme's disease titers per patient request. -blood pressure well controlled; no change in medications. -obtain labs; refills of Atorvastatin provided. -new dx of OSA on CPAP; doing well. -s/p recent evaluation for hearing loss; has hearing aides and doing well. -suffered recent bladder stone; has been evaluated by urology.   Orders Placed This Encounter  Procedures  . CBC with Differential/Platelet  . Comprehensive metabolic panel    Order Specific Question:   Has the patient fasted?    Answer:   Yes  . Lipid panel    Order Specific Question:   Has the patient fasted?    Answer:   Yes  . B. burgdorfi antibodies   Meds ordered this encounter  Medications  . atorvastatin (LIPITOR) 20 MG tablet    Sig: Take 0.5 tablets (10 mg total) by mouth daily.    Dispense:  90 tablet    Refill:  3    Return in about 6 months (around 08/23/2017) for complete physical examiniation.   Fayette Gasner Paulita Fujita, M.D. Primary Care at [facility] [address] [place], [place]  [address] [phone] phone [phone] fax

## 2017-02-21 NOTE — Patient Instructions (Addendum)
   IF you received an x-ray today, you will receive an invoice from [facility]. Please contact [facility] at [phone] with questions or concerns regarding your invoice.   IF you received labwork today, you will receive an invoice from [facility]. Please contact [facility] at [phone] with questions or concerns regarding your invoice.   Our billing staff will not be able to assist you with questions regarding bills from these companies.  You will be contacted with the lab results as soon as they are available. The fastest way to get your results is to activate your My Chart account. Instructions are located on the last page of this paperwork. If you have not heard from us regarding the results in 2 weeks, please contact this office.    Food Choices to Lower Your Triglycerides Triglycerides are a type of fat in your blood. High levels of triglycerides can increase the risk of heart disease and stroke. If your triglyceride levels are high, the foods you eat and your eating habits are very important. Choosing the right foods can help lower your triglycerides. What general guidelines do I need to follow?  Lose weight if you are overweight.  Limit or avoid alcohol.  Fill one half of your plate with vegetables and green salads.  Limit fruit to two servings a day. Choose fruit instead of juice.  Make one fourth of your plate whole grains. Look for the word "whole" as the first word in the ingredient list.  Fill one fourth of your plate with lean protein foods.  Enjoy fatty fish (such as salmon, mackerel, sardines, and tuna) three times a week.  Choose healthy fats.  Limit foods high in starch and sugar.  Eat more home-cooked food and less restaurant, buffet, and fast food.  Limit fried foods.  Cook foods using methods other than frying.  Limit saturated fats.  Check ingredient lists to avoid foods with partially hydrogenated oils (trans fats) in them. What  foods can I eat? Grains Whole grains, such as whole wheat or whole grain breads, crackers, cereals, and pasta. Unsweetened oatmeal, bulgur, barley, quinoa, or brown rice. Corn or whole wheat flour tortillas. Vegetables Fresh or frozen vegetables (raw, steamed, roasted, or grilled). Green salads. Fruits All fresh, canned (in natural juice), or frozen fruits. Meat and Other Protein Products Ground beef (85% or leaner), grass-fed beef, or beef trimmed of fat. Skinless chicken or turkey. Ground chicken or turkey. Pork trimmed of fat. All fish and seafood. Eggs. Dried beans, peas, or lentils. Unsalted nuts or seeds. Unsalted canned or dry beans. Dairy Low-fat dairy products, such as skim or 1% milk, 2% or reduced-fat cheeses, low-fat ricotta or cottage cheese, or plain low-fat yogurt. Fats and Oils Tub margarines without trans fats. Light or reduced-fat mayonnaise and salad dressings. Avocado. Safflower, olive, or canola oils. Natural peanut or almond butter. The items listed above may not be a complete list of recommended foods or beverages. Contact your dietitian for more options. What foods are not recommended? Grains White bread. White pasta. White rice. Cornbread. Bagels, pastries, and croissants. Crackers that contain trans fat. Vegetables White potatoes. Corn. Creamed or fried vegetables. Vegetables in a cheese sauce. Fruits Dried fruits. Canned fruit in light or heavy syrup. Fruit juice. Meat and Other Protein Products Fatty cuts of meat. Ribs, chicken wings, bacon, sausage, bologna, salami, chitterlings, fatback, hot dogs, bratwurst, and packaged luncheon meats. Dairy Whole or 2% milk, cream, half-and-half, and cream cheese. Whole-fat or sweetened yogurt. Full-fat cheeses. Nondairy   creamers and whipped toppings. Processed cheese, cheese spreads, or cheese curds. Sweets and Desserts Corn syrup, sugars, honey, and molasses. Candy. Jam and jelly. Syrup. Sweetened cereals. Cookies, pies,  cakes, donuts, muffins, and ice cream. Fats and Oils Butter, stick margarine, lard, shortening, ghee, or bacon fat. Coconut, palm kernel, or palm oils. Beverages Alcohol. Sweetened drinks (such as sodas, lemonade, and fruit drinks or punches). The items listed above may not be a complete list of foods and beverages to avoid. Contact your dietitian for more information. This information is not intended to replace advice given to you by your health care provider. Make sure you discuss any questions you have with your health care provider. Document Released: 08/11/2004 Document Revised: 03/31/2016 Document Reviewed: 08/28/2013 Elsevier Interactive Patient Education  2017 [employer]  

## 2017-02-22 LAB — CBC WITH DIFFERENTIAL/PLATELET
Basophils Absolute: 0 10*3/uL (ref 0.0–0.2)
Basos: 1 %
EOS (ABSOLUTE): 0.1 10*3/uL (ref 0.0–0.4)
EOS: 3 %
HEMATOCRIT: 44.4 % (ref 37.5–51.0)
HEMOGLOBIN: 14.5 g/dL (ref 13.0–17.7)
Immature Grans (Abs): 0 10*3/uL (ref 0.0–0.1)
Immature Granulocytes: 0 %
LYMPHS ABS: 1.3 10*3/uL (ref 0.7–3.1)
Lymphs: 28 %
MCH: 28.4 pg (ref 26.6–33.0)
MCHC: 32.7 g/dL (ref 31.5–35.7)
MCV: 87 fL (ref 79–97)
MONOCYTES: 10 %
MONOS ABS: 0.5 10*3/uL (ref 0.1–0.9)
NEUTROS ABS: 2.7 10*3/uL (ref 1.4–7.0)
Neutrophils: 58 %
Platelets: 231 10*3/uL (ref 150–379)
RBC: 5.11 x10E6/uL (ref 4.14–5.80)
RDW: 14.5 % (ref 12.3–15.4)
WBC: 4.6 10*3/uL (ref 3.4–10.8)

## 2017-02-22 LAB — COMPREHENSIVE METABOLIC PANEL
A/G RATIO: 1.9 (ref 1.2–2.2)
ALBUMIN: 4.5 g/dL (ref 3.5–4.8)
ALK PHOS: 78 IU/L (ref 39–117)
ALT: 30 IU/L (ref 0–44)
AST: 32 IU/L (ref 0–40)
BILIRUBIN TOTAL: 0.8 mg/dL (ref 0.0–1.2)
BUN / CREAT RATIO: 16 (ref 10–24)
BUN: 16 mg/dL (ref 8–27)
CO2: 25 mmol/L (ref 18–29)
CREATININE: 0.98 mg/dL (ref 0.76–1.27)
Calcium: 9.1 mg/dL (ref 8.6–10.2)
Chloride: 102 mmol/L (ref 96–106)
GFR calc Af Amer: 87 mL/min/{1.73_m2} (ref >59)
GFR calc non Af Amer: 76 mL/min/{1.73_m2} (ref >59)
GLOBULIN, TOTAL: 2.4 g/dL (ref 1.5–4.5)
Glucose: 106 mg/dL — ABNORMAL HIGH (ref 65–99)
POTASSIUM: 4.5 mmol/L (ref 3.5–5.2)
SODIUM: 142 mmol/L (ref 134–144)
Total Protein: 6.9 g/dL (ref 6.0–8.5)

## 2017-02-22 LAB — LIPID PANEL
CHOLESTEROL TOTAL: 147 mg/dL (ref 100–199)
Chol/HDL Ratio: 3.7 ratio (ref 0.0–5.0)
HDL: 40 mg/dL (ref >39)
LDL CALC: 82 mg/dL (ref 0–99)
Triglycerides: 125 mg/dL (ref 0–149)
VLDL Cholesterol Cal: 25 mg/dL (ref 5–40)

## 2017-02-22 LAB — B. BURGDORFI ANTIBODIES: Lyme IgG/IgM Ab: <0.91 {ISR} (ref 0.00–0.90)

## 2017-03-20 DIAGNOSIS — N210 Calculus in bladder: Secondary | ICD-10-CM | POA: Insufficient documentation

## 2017-03-20 DIAGNOSIS — N200 Calculus of kidney: Secondary | ICD-10-CM | POA: Insufficient documentation

## 2017-03-20 DIAGNOSIS — H903 Sensorineural hearing loss, bilateral: Secondary | ICD-10-CM | POA: Insufficient documentation

## 2017-04-11 ENCOUNTER — Ambulatory Visit (INDEPENDENT_AMBULATORY_CARE_PROVIDER_SITE_OTHER): Payer: Medicare Other | Admitting: Neurology

## 2017-04-11 ENCOUNTER — Encounter: Payer: Self-pay | Admitting: Neurology

## 2017-04-11 VITALS — BP 137/80 | HR 75 | Resp 20 | Ht 71.0 in | Wt 215.0 lb

## 2017-04-11 DIAGNOSIS — Z9989 Dependence on other enabling machines and devices: Secondary | ICD-10-CM | POA: Diagnosis not present

## 2017-04-11 DIAGNOSIS — G4733 Obstructive sleep apnea (adult) (pediatric): Secondary | ICD-10-CM | POA: Diagnosis not present

## 2017-04-11 DIAGNOSIS — M47812 Spondylosis without myelopathy or radiculopathy, cervical region: Secondary | ICD-10-CM | POA: Diagnosis not present

## 2017-04-11 NOTE — Progress Notes (Signed)
Subjective:    Patient ID: Todd [name] is a 76 y.o. male.  HPI     Interim history:   Todd [name] is a 76 year old right-handed gentleman with an underlying medical history of hypertension, history of arthritis, s/p b/l knee surgeries in 2003 secondary to fall down stairs, with tendon tears b/l, skin cancer, cataract with status post repairs, hiatal hernia, hyperlipidemia, degenerative neck d/s with s/p surgery in '76, history of angioedema, and obesity, who presents for follow-up consultation of his obstructive sleep apnea, on CPAP therapy. The patient is unaccompanied today. I last saw him on 11/28/2016, at which time he reported doing well with CPAP therapy, had adjusted well to treatment and was fully compliant. Due to his residual AHI of 28.7 per hour at the time I suggested we increase his CPAP pressure from 8 cm to 10 cm. He had an interim appointment with Vaughan Browner on 01/26/2017, at which time he was again compliant with CPAP therapy but residual AHI was still suboptimal at over 30 per hour. Symptoms were mostly obstructive in nature and I suggested we increase his pressure to 13 cm.   Today, 04/11/2017 (all dictated new, as well as above notes, some dictation done in note pad or Word, outside of chart, may appear as copied):   I reviewed his CPAP compliance data from 03/11/2017 through 04/09/2017 which is a total of 30 days, during which time he used his machine 100%, average usage of 7 hours and 38 minutes, residual AHI improved but still elevated at 21.7 per hour, Mostly obstructive in nature, leak acceptable with the 95th percentile at 14.2 L/m on a pressure of 13 cm with EPR of 3. He reports doing well, tolerated the increase in pressure. Has had some numbness in the pinky fingers b/l, has a Hx of degenerative cervical spine d/s, s/p surgery in 76. Some R knee discomfort.  Using a Lg FFM, some leak, but overall okay, still feels he has done quite well with using CPAP in the sense that he  feels improved, daytime energy better, headaches improved.  The patient's allergies, current medications, family history, past medical history, past social history, past surgical history and problem list were reviewed and updated as appropriate.   Previously (copied from previous notes for reference):   I first met him on 08/09/2016 at the request of his primary care physician, at which time he reported snoring and excessive daytime somnolence as well as worsening nocturia. I invited him for sleep study. He had a split-night sleep study on 08/21/2016. We went over his test results today. Baseline sleep efficiency was markedly reduced at 50.8%, wake after sleep onset was 83 minutes, REM latency was normal, sleep latency delayed. Total AHI was 28.8 per hour, REM AHI was 67.5 per hour, O2 nadir was 83%.   He was titrated on CPAP from 5 cm to 8 cm, AHI was 0 per hour on a pressure of 8 cm. He had severe PLMS during the baseline and the CPAP titration portion of the study with minimal to mild arousals.   I reviewed his CPAP compliance data from 10/29/2016 through 11/27/2016, which is a total of 30 days, during which time he used his CPAP every night with percent used days greater than 4 hours at 100%, indicating superb compliance with an average usage of 8 hours and 11 minutes, residual AHI elevated at 28.7 per hour, mostly obstructive in nature, leak low with the 95th percentile at 7.2 L/m on a pressure of  8 cm with EPR of 3.   08/21/2016: He reports snoring and excessive daytime somnolence. His Epworth sleepiness score is 14 out of 24 today. Fatigue score is 15 out of 63. Snoring can be loud. He denies restless leg symptoms. He has had nocturia for years, worse in the past year with 3-4 bathroom visits per night. He has seen urologist for this was advised that his prostate looked fine. He quit smoking in 1973, drinks alcohol occasionally, and drinks about 3 cups of coffee per day but typically no sodas and  decaf tea only. I reviewed your office note from 07/06/2016. 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 He tries to exercise regularly, and tries to drink enough water. He has gained weight in the last few years, especially after his retirement.  He does not watch TV in bed, lives with his wife, has 2 grown daughters, one in [place] and the other in [place], [place]. He suspects that his father may have had obstructive sleep apnea. He tries to keep a scheduled for his sleep time and wake up time. They have no pets at this time. He likes to travel since his retirement.   His Past Medical History Is Significant For: Past Medical History:  Diagnosis Date  . Allergy   . Angioedema   . Arthritis   . 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  [facility].  . Bladder stone   . Cancer ([place])    skin cancer multiple; followed by [facility]/dermatology yearly.  . Cataract    4 YEARS - BILATERAL  . Colon polyps     Previous polyps on colonoscopy.  [facility].    . Hiatal hernia 10/20/2013   EGD confirmed.  [facility].  . Hyperlipemia   . Hypertension    no meds  . Neuromuscular disorder ([facility])    numbness rt hand  . Seasonal allergies   . Sleep apnea    w CPAP x 3 months    His Past Surgical History Is Significant For: Past Surgical History:  Procedure Laterality Date  . BLADDER STONE REMOVAL  2011, 2018  . CARPAL TUNNEL RELEASE  01/17/2012   Procedure: CARPAL TUNNEL RELEASE;  Surgeon: Cammie Sickle., MD;  Location: [facility];  Service: Orthopedics;  Laterality: Right;  . [place]  . COLONOSCOPY  10/20/2013   [facility].  Polyps.   . CYSTOSCOPY WITH LITHOLAPAXY N/A 01/09/2017   Procedure: CYSTOSCOPY WITH LITHOLAPAXY;  Surgeon: Franchot Gallo, MD;  Location[facility];  Service: Urology;  Laterality: N/A;  . ESOPHAGOGASTRODUODENOSCOPY  10/20/2013   [facility]; biopsy of area concernig for Barrett's; gastritis.   Marland Kitchen EYE SURGERY  2012   both cataracts  . FRACTURE SURGERY     3 fingers  . HOLMIUM LASER APPLICATION N/A 06/07/8562   Procedure: HOLMIUM LASER APPLICATION;  Surgeon: Franchot Gallo, MD;  Location: [facility];  Service: Urology;  Laterality: N/A;  . QUADRICEPS TENDON REPAIR  2003   bilateral  . ULNAR NERVE TRANSPOSITION  01/17/2012   Procedure: ULNAR NERVE DECOMPRESSION/TRANSPOSITION;  Surgeon: Cammie Sickle., MD;  Location: [facility];  Service: Orthopedics;  Laterality: Right;  decompression of ulnar nerve only  at right cubital tunnel  . UPPER GASTROINTESTINAL ENDOSCOPY    . VASECTOMY      His Family History Is Significant For: Family History  Problem Relation Age of  Onset  . Cirrhosis Father   . Hypertension Father   . Alcohol abuse Father   . Heart failure Mother        in 53s  . Arthritis Brother        Rheumatoid arthritis  . Hypertension Brother     His Social History Is Significant For: Social History   Social History  . Marital status: Married    Spouse name: N/A  . Number of children: 2  . Years of education: 12   Occupational History  . Retired    Social History Main Topics  . Smoking status: Former Smoker    Packs/day: 2.00    Years: 15.00    Types: Cigarettes    Quit date: 01/11/1974  . Smokeless tobacco: Never Used  . Alcohol use No     Comment: occ  . Drug use: No  . Sexual activity: No   Other Topics Concern  . None   Social History Narrative   Marital status:  Married x 42 years      Children: 2 daughters; 3 grandchildren.      Employment; retired in 1998 from [employer]; retired age 74 from [employer] after 14 years.      Lives: with wife.      Tobacco: former smoker; quit at age 24.  Smoked 15 years.      Alcohol:  On vacation or special occasions.        Education: [employer].       Exercise: Gym 3-4 times a week for 2 hours.; exercises 7 days per week.   Elliptical for one hour.       Advanced Directives: none; +desires FULL CODE.        ADLs: independent with ADLs; drives.  Does not walk with assistant devices.     Drinks 3-4 caffeine drinks a day     His Allergies Are:  Allergies  Allergen Reactions  . Ace Inhibitors Cough  . Amoxicillin Nausea And Vomiting  . Darvon     Chest pain  . Toprol Xl [Metoprolol Tartrate]   :   His Current Medications Are:  Outpatient Encounter Prescriptions as of 04/11/2017  Medication Sig  . atorvastatin (LIPITOR) 20 MG tablet Take 0.5 tablets (10 mg total) by mouth daily.  . cetirizine (ZYRTEC) 10 MG tablet Take 1 tablet (10 mg total) by mouth at bedtime. (Patient taking differently: Take 10 mg by mouth daily. )  . fish oil-omega-3 fatty acids 1000 MG capsule Take 1 g by mouth 2 (two) times daily.   . fluticasone (FLONASE) 50 MCG/ACT nasal spray Place 2 sprays into both nostrils daily. AS NEEDED  . GARLIC PO Take 841 mg by mouth 2 (two) times daily.   Marland Kitchen losartan (COZAAR) 50 MG tablet Take 1 tablet (50 mg total) by mouth daily.  . Misc Natural Products (GLUCOSAMINE CHOND COMPLEX/MSM PO) Take by mouth.  . Multiple Vitamin (MULTIVITAMIN) tablet Take 1 tablet by mouth daily.  . Naproxen Sodium (ALEVE PO) Take by mouth as needed.  . saw palmetto 160 MG capsule Take 160 mg by mouth 2 (two) times daily.   No facility-administered encounter medications on file as of 04/11/2017.   :  Review of Systems:  Out of a complete 14 point review of systems, all are reviewed and negative with the exception of these symptoms as listed below: Review of Systems  Neurological:       Pt presents today to follow up on his  cpap. Pt is doing well and no complaints with the cpap, other than marks on his face from his mask occasionally.    Objective:  Neurologic Exam  Physical Exam Physical Examination:   Vitals:   04/11/17 0806  BP: 137/80  Pulse: 75  Resp: 20   General Examination: The patient is a very pleasant 76 y.o. male in no acute  distress. He appears well-developed and well-nourished and well groomed.   HEENT: Normocephalic, atraumatic, pupils are equal, round and reactive to light and accommodation. Status post cataract repairs. Extraocular tracking is good without limitation to gaze excursion or nystagmus noted. Normal smooth pursuit is noted. Hearing is grossly intactWith bilateral hearing aids in place. Face is symmetric with normal facial animation and normal facial sensation. Speech is clear with no dysarthria noted. There is no hypophonia. There is no lip, neck/head, jaw or voice tremor. Neck is supple with full range of passive and active motion. Oropharynx exam reveals: mild to moderate mouth dryness, adequate dental hygiene and moderate airway crowding. Mallampati is class II. Tongue protrudes centrally and palate elevates symmetrically. Nasal inspection reveals no significant nasal mucosal bogginess or redness but significant septal deviation to the R.   Chest: Clear to auscultation without wheezing, rhonchi or crackles noted.  Heart: S1+S2+0, regular and normal without murmurs, rubs or gallops noted.   Abdomen: Soft, non-tender and non-distended with normal bowel sounds appreciated on auscultation.  Extremities: There is no pitting edema in the distal lower extremities bilaterally.   Skin: Warm and dry without trophic changes noted. There are no varicose veins. Chronic sun exposure type changes  Musculoskeletal: exam reveals no obvious joint deformities, tenderness or joint swelling or erythema, has decrease in ROM both arms and both knees, some discomfort R knee.   Neurologically:  Mental status: The patient is awake, alert and oriented in all 4 spheres. His immediate and remote memory, attention, language skills and fund of knowledge are appropriate. There is no evidence of aphasia, agnosia, apraxia or anomia. Speech is clear with normal prosody and enunciation. Thought process is linear. Mood is normal  and affect is normal.  Cranial nerves II - XII are as described above under HEENT exam. Motor exam: Normal bulk, strength and tone is noted. There is no drift, tremor or rebound. Romberg is negative. Reflexes are 1+ in the UEs and diminished in the LEs. Fine motor skills and coordination: intact with normal finger taps, normal hand movements, normal rapid alternating patting, normal foot taps and normal foot agility.  Cerebellar testing: No dysmetria or intention tremor. There is no truncal or gait ataxia.  Sensory exam: intact to light touch in the upper and lower extremities.  Gait, station and balance: He stands easily. No veering to one side is noted. No leaning to one side is noted. Posture is age-appropriate and stance is narrow based. Gait shows normal stride length and normal pace. No problems turning are noted.   Assessment and plan:  In summary, Todd [name] is a very pleasant 76 year old male with an underlying medical history of hypertension, history of arthritis, s/p b/l knee surgeries in 2003, skin cancer, cataract, status post repairs, hiatal hernia, hyperlipidemia, history of angioedema, status post neck surgery, and obesity, who presents for follow-up consultation of his moderate to severe obstructive sleep apnea, on CPAP therapy. He is fully compliant with treatment and commended for this. We reviewed his most recent one month compliance data. He most recently increased his CPAP pressure from 10 cm to  13 cm. This improved his AHI by about 10 points but he still has suboptimal numbers. His leak information indicates adequate mask seal. He has had some trouble getting the right size of his fullface mask reporting that he was given medium and large size and he needs the large only. He will go by his DME office and try to correct this. He had a split-night sleep study on 08/21/2016 which we have previously discussed in detail. He established treatment with CPAP and has been fully compliant  from the get go. Nevertheless, at this juncture, would like to see if we can further increase his pressure to 15 cm. He is willing to give it a try. I suggested he give [place] a phone call in about a month to see if things are going well and we can pull a 30 day download at the time periods should he have significant mouth dryness or the pressure feels too high or air leakage increases significantly, we may have to back down on the pressure again. I suggested a six-month follow-up with one of our nurse practitioners. He reports intermittent numbness in his both pinky fingers. He has a history of multilevel cervical degenerative disc disease and his cervical spine MRI from 2001 was reviewed with him. He may need to see his neurosurgeon Dr. Sherwood Gambler again. Overall, strength and sensory exam are stable. I answered all his questions today and he was in agreement with the plan.  I spent 25 minutes in total face-to-face time with the patient, more than 50% of which was spent in counseling and coordination of care, reviewing test results, reviewing medication and discussing or reviewing the diagnosis of OSA, degenerative cervical spine d/s, the prognosis and treatment options. Pertinent laboratory and imaging test results that were available during this visit with the patient were reviewed by me and considered in my medical decision making (see chart for details).

## 2017-04-11 NOTE — Patient Instructions (Addendum)
You have done great with your CPAP usage, keep up the good work! As discussed, I would like to see if we can increase your pressure yet again to 15 cm to see if your residual sleep apnea improves. Please call us after about a month of trying this to see if things are okay. If you should have severe mouth dryness or pressures feels too high or you have air leaking problems, we can reduce the pressure again.  For your neck degenerative disease, you may want to see Dr. Sherwood Gambler, neurosurgeon, again. Your MRI neck from August 2011 showed multilevel degenerative changes, which may have become worse.

## 2017-04-12 ENCOUNTER — Ambulatory Visit (INDEPENDENT_AMBULATORY_CARE_PROVIDER_SITE_OTHER): Payer: Medicare Other

## 2017-04-12 ENCOUNTER — Ambulatory Visit (INDEPENDENT_AMBULATORY_CARE_PROVIDER_SITE_OTHER): Payer: Medicare Other | Admitting: Family Medicine

## 2017-04-12 ENCOUNTER — Encounter: Payer: Self-pay | Admitting: Family Medicine

## 2017-04-12 VITALS — BP 124/72 | HR 75 | Temp 97.9°F | Resp 18 | Ht 71.65 in | Wt 226.0 lb

## 2017-04-12 DIAGNOSIS — R202 Paresthesia of skin: Secondary | ICD-10-CM

## 2017-04-12 DIAGNOSIS — M5030 Other cervical disc degeneration, unspecified cervical region: Secondary | ICD-10-CM

## 2017-04-12 DIAGNOSIS — M25561 Pain in right knee: Secondary | ICD-10-CM | POA: Diagnosis not present

## 2017-04-12 MED ORDER — MELOXICAM 15 MG PO TABS: 15.0000 mg | ORAL_TABLET | Freq: Every day | ORAL | 0 refills | Status: DC

## 2017-04-12 NOTE — Progress Notes (Signed)
Subjective:    Patient ID: Todd [name], male    DOB: 05/29/1941, 76 y.o.   MRN: [id number]  04/12/2017  Knee Pain (right knee x 2 days )   HPI This 76 y.o. male presents for evaluation of RIGHT knee pain for two days.  Has still been going to the gym and doing leg presses.  Stiffens if sits too long.  [place].  Needs miracle.  No injury; no overuse.  Has decreased activity in past two days.  Intermittent issue.  Pain occurs weight bearing.  After moving for a little bit, stiffness calms down.  Took Aleve and BC.  No swelling.  Limping with first few steps.   Two pinkies: now R pinky is less tingling.  Dr. Frances Furbish looked at MRI cervical spine in 2001; saw Dr. Newell Coral in 2001 due to having problem with cervical spine.  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  Wanting appointment in July with Newell Coral. R fifth finger much better.  LEFT fifth digit has good strength yet feels like has a band around it.  R fifth finger still feels like a band yet less.  No neck pain at all at this time.  Neck is stiff with decreased range of motion yet chronic and does not interfere with function/daily activities.  Base of finger and up is band like; no radiation up into arm.  MRI cervical spine in 2001 reviewed by Dr. Frances Furbish.  BP Readings from Last 3 Encounters:  04/12/17 124/72  04/11/17 137/80  02/21/17 129/80   Wt Readings from Last 3 Encounters:  04/12/17 226 lb (102.5 kg)  04/11/17 215 lb (97.5 kg)  02/21/17 224 lb 9.6 oz (101.9 kg)   Immunization History  Administered Date(s) Administered  . Influenza,inj,Quad PF,36+ Mos 07/15/2013, 07/21/2014, 07/07/2015, 07/06/2016  . Pneumococcal Conjugate-13 08/11/2014  . Pneumococcal Polysaccharide-23 03/10/2008, 08/23/2016  . Tdap 06/14/2010  . Zoster 11/07/2010    Review of Systems  Musculoskeletal: Positive for arthralgias and gait problem. Negative for joint swelling, neck pain and neck stiffness.    Neurological: Positive for numbness. Negative for weakness.    Past Medical History:  Diagnosis Date  . Allergy   . Angioedema   . Arthritis   . 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  [facility].  . Bladder stone   . Cancer (HCC)    skin cancer multiple; followed by [facility]ology yearly.  . Cataract    4 YEARS - BILATERAL  . Colon polyps     Previous polyps on colonoscopy.  [facility].    . Hiatal hernia 10/20/2013   EGD confirmed.  [facility].  . Hyperlipemia   . Hypertension    no meds  . Neuromuscular disorder (HCC)    numbness rt hand  . Seasonal allergies   . Sleep apnea    w CPAP x 3 months   Past Surgical History:  Procedure Laterality Date  . BLADDER STONE REMOVAL  2011, 2018  . CARPAL TUNNEL RELEASE  01/17/2012   Procedure: CARPAL TUNNEL RELEASE;  Surgeon: [name]., MD;  Location: [facility];  Service: Orthopedics;  Laterality: Right;  . CERVICAL FUSION  1976  . COLONOSCOPY  10/20/2013   [facility].  Polyps.   . CYSTOSCOPY WITH LITHOLAPAXY N/A 01/09/2017   Procedure: CYSTOSCOPY WITH LITHOLAPAXY;  Surgeon: Marcine Matar, MD;  Location: [facility];  Service: Urology;  Laterality: N/A;  . ESOPHAGOGASTRODUODENOSCOPY  10/20/2013   [facility]; biopsy of area concernig for Barrett's; gastritis.  Marland Kitchen EYE SURGERY  2012   both cataracts  . FRACTURE SURGERY     3 fingers  . HOLMIUM LASER APPLICATION N/A 01/09/2017   Procedure: HOLMIUM LASER APPLICATION;  Surgeon: Marcine Matar, MD;  Location: [facility];  Service: Urology;  Laterality: N/A;  . QUADRICEPS TENDON REPAIR  2003   bilateral  . ULNAR NERVE TRANSPOSITION  01/17/2012   Procedure: ULNAR NERVE DECOMPRESSION/TRANSPOSITION;  Surgeon: [name]., MD;  Location: [facility];  Service: Orthopedics;  Laterality: Right;  decompression of ulnar nerve only  at right cubital tunnel  . UPPER GASTROINTESTINAL ENDOSCOPY    . VASECTOMY     Allergies  Allergen  Reactions  . Ace Inhibitors Cough  . Amoxicillin Nausea And Vomiting  . Darvon     Chest pain  . Toprol Xl [Metoprolol Tartrate]    Current Outpatient Prescriptions  Medication Sig Dispense Refill  . atorvastatin (LIPITOR) 20 MG tablet Take 0.5 tablets (10 mg total) by mouth daily. 90 tablet 3  . cetirizine (ZYRTEC) 10 MG tablet Take 1 tablet (10 mg total) by mouth at bedtime. (Patient taking differently: Take 10 mg by mouth daily. ) 90 tablet 3  . fish oil-omega-3 fatty acids 1000 MG capsule Take 1 g by mouth 2 (two) times daily.     . fluticasone (FLONASE) 50 MCG/ACT nasal spray Place 2 sprays into both nostrils daily. AS NEEDED 16 g 11  . GARLIC PO Take 109 mg by mouth 2 (two) times daily.     Marland Kitchen losartan (COZAAR) 50 MG tablet Take 1 tablet (50 mg total) by mouth daily. 90 tablet 3  . Misc Natural Products (GLUCOSAMINE CHOND COMPLEX/MSM PO) Take by mouth.    . Multiple Vitamin (MULTIVITAMIN) tablet Take 1 tablet by mouth daily.    . Naproxen Sodium (ALEVE PO) Take by mouth as needed.    . saw palmetto 160 MG capsule Take 160 mg by mouth 2 (two) times daily.    . meloxicam (MOBIC) 15 MG tablet Take 1 tablet (15 mg total) by mouth daily. 30 tablet 0   No current facility-administered medications for this visit.    Social History   Social History  . Marital status: Married    Spouse name: N/A  . Number of children: 2  . Years of education: 12   Occupational History  . Retired    Social History Main Topics  . Smoking status: Former Smoker    Packs/day: 2.00    Years: 15.00    Types: Cigarettes    Quit date: 01/11/1974  . Smokeless tobacco: Never Used  . Alcohol use No     Comment: occ  . Drug use: No  . Sexual activity: No   Other Topics Concern  . Not on file   Social History Narrative   Marital status:  Married x 42 years      Children: 2 daughters; 3 grandchildren.      Employment; retired in 1998 from [employer]; retired age 58 from [employer]  after 14 years.      Lives: with wife.      Tobacco: former smoker; quit at age 20.  Smoked 15 years.      Alcohol:  On vacation or special occasions.        Education: [employer].       Exercise: Gym 3-4 times a week for 2 hours.;  exercises 7 days per week.   Elliptical for one hour.      Advanced Directives: none; +desires FULL CODE.        ADLs: independent with ADLs; drives.  Does not walk with assistant devices.     Drinks 3-4 caffeine drinks a day    Family History  Problem Relation Age of Onset  . Cirrhosis Father   . Hypertension Father   . Alcohol abuse Father   . Heart failure Mother        in 51s  . Arthritis Brother        Rheumatoid arthritis  . Hypertension Brother        Objective:    BP 124/72   Pulse 75   Temp 97.9 F (36.6 C) (Oral)   Resp 18   Ht 5' 11.65" (1.82 m)   Wt 226 lb (102.5 kg)   SpO2 96%   BMI 30.95 kg/m  Physical Exam  Constitutional: He is oriented to person, place, and time. He appears well-developed and well-nourished. No distress.  HENT:  Head: Normocephalic and atraumatic.  Eyes: Conjunctivae and EOM are normal. Pupils are equal, round, and reactive to light.  Neck: Normal range of motion. Neck supple. Carotid bruit is not present. No thyromegaly present.  Cardiovascular: Normal rate, regular rhythm, normal heart sounds and intact distal pulses.  Exam reveals no gallop and no friction rub.   No murmur heard. Pulmonary/Chest: Effort normal and breath sounds normal. He has no wheezes. He has no rales.  Musculoskeletal:       Right shoulder: Normal.       Left shoulder: Normal.       Right elbow: Normal.      Left elbow: Normal.       Right wrist: Normal.       Left wrist: Normal.       Right knee: He exhibits normal range of motion, no swelling, no effusion and no bony tenderness. No tenderness found. No medial joint line, no lateral joint line, no MCL, no LCL and no patellar tendon tenderness noted.       Cervical back: He  exhibits decreased range of motion. He exhibits no tenderness, no bony tenderness, no swelling, no pain and no spasm.       Right hand: Normal.       Left hand: Normal.  B fifth digits: full extension and flexion; motor 5/5. B hands: +arthritic changes at IP joints.  Lymphadenopathy:    He has no cervical adenopathy.  Neurological: He is alert and oriented to person, place, and time. No cranial nerve deficit.  Skin: Skin is warm and dry. No rash noted. He is not diaphoretic.  Psychiatric: He has a normal mood and affect. His behavior is normal.  Nursing note and vitals reviewed.       Assessment & Plan:   1. DDD (degenerative disc disease), cervical   2. Acute pain of right knee   3. Paresthesias    -improving yet persistent paresthesias in B fifth digits; known DDD cervical spine. Neurology recommends follow-up with neurosurgery yet MRI cervical spine nor NCS ordered by neurology. Pt now requesting NS referral; no current or recent neck pain or radicular symptoms other than vague paresthesias in fifth digits without weakness.  Proceed with C spine films; advised pt that neurosurgery will not accept a referral without an MRI spine cervical nor will NS desire to see patient unless a surgical intervention or injection will improve symptoms. May  warrant MRI cervical spine and NCS and will defer to neurology to determine appropriate next steps. -New onset acute RIGHT knee pain; obtain R knee xray; recommend rest, icing, home exercise program. Rx for Mobic provided to take daily considering upcoming travel.    Orders Placed This Encounter  Procedures  . DG Cervical Spine Complete    Standing Status:   Future    Standing Expiration Date:   04/12/2018    Order Specific Question:   Reason for Exam (SYMPTOM  OR DIAGNOSIS REQUIRED)    Answer:   R knee pain and stiffness    Order Specific Question:   Preferred imaging location?    Answer:   External  . DG Knee Complete 4 Views Right    Standing  Status:   Future    Standing Expiration Date:   04/12/2018    Order Specific Question:   Reason for Exam (SYMPTOM  OR DIAGNOSIS REQUIRED)    Answer:   R knee pain and stiffness    Order Specific Question:   Preferred imaging location?    Answer:   External  . Ambulatory referral to Neurosurgery    Referral Priority:   Routine    Referral Type:   Surgical    Referral Reason:   Specialty Services Required    Requested Specialty:   Neurosurgery    Number of Visits Requested:   1   Meds ordered this encounter  Medications  . meloxicam (MOBIC) 15 MG tablet    Sig: Take 1 tablet (15 mg total) by mouth daily.    Dispense:  30 tablet    Refill:  0    No Follow-up on file.   Katelyne Galster Paulita Fujita, M.D. Primary Care at [facility] previously [facility] [address] [place], [place]  [address] [phone] phone [phone] fax

## 2017-04-12 NOTE — Patient Instructions (Addendum)
CALL IN TWO WEEKS IF KNEE PAIN IS NOT MUCH IMPROVED.    IF you received an x-ray today, you will receive an invoice from [facility]. Please contact [facility] at [phone] with questions or concerns regarding your invoice.   IF you received labwork today, you will receive an invoice from [facility]. Please contact [facility] at [phone] with questions or concerns regarding your invoice.   Our billing staff will not be able to assist you with questions regarding bills from these companies.  You will be contacted with the lab results as soon as they are available. The fastest way to get your results is to activate your My Chart account. Instructions are located on the last page of this paperwork. If you have not heard from [place] regarding the results in 2 weeks, please contact this office.      Knee Exercises Ask your health care provider which exercises are safe for you. Do exercises exactly as told by your health care provider and adjust them as directed. It is normal to feel mild stretching, pulling, tightness, or discomfort as you do these exercises, but you should stop right away if you feel sudden pain or your pain gets worse.Do not begin these exercises until told by your health care provider. STRETCHING AND RANGE OF MOTION EXERCISES These exercises warm up your muscles and joints and improve the movement and flexibility of your knee. These exercises also help to relieve pain, numbness, and tingling. Exercise A: Knee Extension, Prone 1. Lie on your abdomen on a bed. 2. Place your left / right knee just beyond the edge of the surface so your knee is not on the bed. You can put a towel under your left / right thigh just above your knee for comfort. 3. Relax your leg muscles and allow gravity to straighten your knee. You should feel a stretch behind your left / right knee. 4. Hold this position for __________ seconds. 5. Scoot up so your knee is supported between  repetitions. Repeat __________ times. Complete this stretch __________ times a day. Exercise B: Knee Flexion, Active  1. Lie on your back with both knees straight. If this causes back discomfort, bend your left / right knee so your foot is flat on the floor. 2. Slowly slide your left / right heel back toward your buttocks until you feel a gentle stretch in the front of your knee or thigh. 3. Hold this position for __________ seconds. 4. Slowly slide your left / right heel back to the starting position. Repeat __________ times. Complete this exercise __________ times a day. Exercise C: Quadriceps, Prone  1. Lie on your abdomen on a firm surface, such as a bed or padded floor. 2. Bend your left / right knee and hold your ankle. If you cannot reach your ankle or pant leg, loop a belt around your foot and grab the belt instead. 3. Gently pull your heel toward your buttocks. Your knee should not slide out to the side. You should feel a stretch in the front of your thigh and knee. 4. Hold this position for __________ seconds. Repeat __________ times. Complete this stretch __________ times a day. Exercise D: Hamstring, Supine 1. Lie on your back. 2. Loop a belt or towel over the ball of your left / right foot. The ball of your foot is on the walking surface, right under your toes. 3. Straighten your left / right knee and slowly pull on the belt to raise your leg until you  feel a gentle stretch behind your knee. ? Do not let your left / right knee bend while you do this. ? Keep your other leg flat on the floor. 4. Hold this position for __________ seconds. Repeat __________ times. Complete this stretch __________ times a day. STRENGTHENING EXERCISES These exercises build strength and endurance in your knee. Endurance is the ability to use your muscles for a long time, even after they get tired. Exercise E: Quadriceps, Isometric  1. Lie on your back with your left / right leg extended and your  other knee bent. Put a rolled towel or small pillow under your knee if told by your health care provider. 2. Slowly tense the muscles in the front of your left / right thigh. You should see your kneecap slide up toward your hip or see increased dimpling just above the knee. This motion will push the back of the knee toward the floor. 3. For __________ seconds, keep the muscle as tight as you can without increasing your pain. 4. Relax the muscles slowly and completely. Repeat __________ times. Complete this exercise __________ times a day. Exercise F: Straight Leg Raises - Quadriceps 1. Lie on your back with your left / right leg extended and your other knee bent. 2. Tense the muscles in the front of your left / right thigh. You should see your kneecap slide up or see increased dimpling just above the knee. Your thigh may even shake a bit. 3. Keep these muscles tight as you raise your leg 4-6 inches (10-15 cm) off the floor. Do not let your knee bend. 4. Hold this position for __________ seconds. 5. Keep these muscles tense as you lower your leg. 6. Relax your muscles slowly and completely after each repetition. Repeat __________ times. Complete this exercise __________ times a day. Exercise G: Hamstring, Isometric 1. Lie on your back on a firm surface. 2. Bend your left / right knee approximately __________ degrees. 3. Dig your left / right heel into the surface as if you are trying to pull it toward your buttocks. Tighten the muscles in the back of your thighs to dig as hard as you can without increasing any pain. 4. Hold this position for __________ seconds. 5. Release the tension gradually and allow your muscles to relax completely for __________ seconds after each repetition. Repeat __________ times. Complete this exercise __________ times a day. Exercise H: Hamstring Curls  If told by your health care provider, do this exercise while wearing ankle weights. Begin with __________ weights.  Then increase the weight by 1 lb (0.5 kg) increments. Do not wear ankle weights that are more than __________. 1. Lie on your abdomen with your legs straight. 2. Bend your left / right knee as far as you can without feeling pain. Keep your hips flat against the floor. 3. Hold this position for __________ seconds. 4. Slowly lower your leg to the starting position.  Repeat __________ times. Complete this exercise __________ times a day. Exercise I: Squats (Quadriceps) 1. Stand in front of a table, with your feet and knees pointing straight ahead. You may rest your hands on the table for balance but not for support. 2. Slowly bend your knees and lower your hips like you are going to sit in a chair. ? Keep your weight over your heels, not over your toes. ? Keep your lower legs upright so they are parallel with the table legs. ? Do not let your hips go lower than your knees. ?  Do not bend lower than told by your health care provider. ? If your knee pain increases, do not bend as low. 3. Hold the squat position for __________ seconds. 4. Slowly push with your legs to return to standing. Do not use your hands to pull yourself to standing. Repeat __________ times. Complete this exercise __________ times a day. Exercise J: Wall Slides (Quadriceps)  1. Lean your back against a smooth wall or door while you walk your feet out 18-24 inches (46-61 cm) from it. 2. Place your feet hip-width apart. 3. Slowly slide down the wall or door until your knees bend __________ degrees. Keep your knees over your heels, not over your toes. Keep your knees in line with your hips. 4. Hold for __________ seconds. Repeat __________ times. Complete this exercise __________ times a day. Exercise K: Straight Leg Raises - Hip Abductors 1. Lie on your side with your left / right leg in the top position. Lie so your head, shoulder, knee, and hip line up. You may bend your bottom knee to help you keep your balance. 2. Roll  your hips slightly forward so your hips are stacked directly over each other and your left / right knee is facing forward. 3. Leading with your heel, lift your top leg 4-6 inches (10-15 cm). You should feel the muscles in your outer hip lifting. ? Do not let your foot drift forward. ? Do not let your knee roll toward the ceiling. 4. Hold this position for __________ seconds. 5. Slowly return your leg to the starting position. 6. Let your muscles relax completely after each repetition. Repeat __________ times. Complete this exercise __________ times a day. Exercise L: Straight Leg Raises - Hip Extensors 1. Lie on your abdomen on a firm surface. You can put a pillow under your hips if that is more comfortable. 2. Tense the muscles in your buttocks and lift your left / right leg about 4-6 inches (10-15 cm). Keep your knee straight as you lift your leg. 3. Hold this position for __________ seconds. 4. Slowly lower your leg to the starting position. 5. Let your leg relax completely after each repetition. Repeat __________ times. Complete this exercise __________ times a day. This information is not intended to replace advice given to you by your health care provider. Make sure you discuss any questions you have with your health care provider. Document Released: 09/07/2005 Document Revised: 07/18/2016 Document Reviewed: 08/30/2015 Elsevier Interactive Patient Education  2018 [employer].

## 2017-05-22 ENCOUNTER — Other Ambulatory Visit: Payer: Self-pay | Admitting: Orthopedic Surgery

## 2017-05-26 ENCOUNTER — Encounter (HOSPITAL_BASED_OUTPATIENT_CLINIC_OR_DEPARTMENT_OTHER): Payer: Self-pay | Admitting: *Deleted

## 2017-05-29 ENCOUNTER — Encounter (HOSPITAL_BASED_OUTPATIENT_CLINIC_OR_DEPARTMENT_OTHER)
Admission: RE | Admit: 2017-05-29 | Discharge: 2017-05-29 | Disposition: A | Discharge status: Home / Self Care | Payer: Medicare Other | Source: Ambulatory Visit | Attending: Orthopedic Surgery | Admitting: Orthopedic Surgery

## 2017-05-29 ENCOUNTER — Other Ambulatory Visit: Payer: Self-pay

## 2017-05-29 DIAGNOSIS — Z888 Allergy status to other drugs, medicaments and biological substances status: Secondary | ICD-10-CM | POA: Diagnosis not present

## 2017-05-29 DIAGNOSIS — Z85828 Personal history of other malignant neoplasm of skin: Secondary | ICD-10-CM | POA: Diagnosis not present

## 2017-05-29 DIAGNOSIS — Z6832 Body mass index (BMI) 32.0-32.9, adult: Secondary | ICD-10-CM | POA: Diagnosis not present

## 2017-05-29 DIAGNOSIS — Z86010 Personal history of colonic polyps: Secondary | ICD-10-CM | POA: Diagnosis not present

## 2017-05-29 DIAGNOSIS — M1990 Unspecified osteoarthritis, unspecified site: Secondary | ICD-10-CM | POA: Diagnosis not present

## 2017-05-29 DIAGNOSIS — Z981 Arthrodesis status: Secondary | ICD-10-CM | POA: Diagnosis not present

## 2017-05-29 DIAGNOSIS — E785 Hyperlipidemia, unspecified: Secondary | ICD-10-CM | POA: Diagnosis not present

## 2017-05-29 DIAGNOSIS — G5622 Lesion of ulnar nerve, left upper limb: Secondary | ICD-10-CM | POA: Diagnosis present

## 2017-05-29 DIAGNOSIS — G4730 Sleep apnea, unspecified: Secondary | ICD-10-CM | POA: Diagnosis not present

## 2017-05-29 DIAGNOSIS — I10 Essential (primary) hypertension: Secondary | ICD-10-CM | POA: Diagnosis not present

## 2017-05-29 DIAGNOSIS — K219 Gastro-esophageal reflux disease without esophagitis: Secondary | ICD-10-CM | POA: Diagnosis not present

## 2017-05-29 DIAGNOSIS — Z8249 Family history of ischemic heart disease and other diseases of the circulatory system: Secondary | ICD-10-CM | POA: Diagnosis not present

## 2017-05-29 DIAGNOSIS — Z880 Allergy status to penicillin: Secondary | ICD-10-CM | POA: Diagnosis not present

## 2017-05-29 DIAGNOSIS — J449 Chronic obstructive pulmonary disease, unspecified: Secondary | ICD-10-CM | POA: Diagnosis not present

## 2017-05-29 DIAGNOSIS — Z87891 Personal history of nicotine dependence: Secondary | ICD-10-CM | POA: Diagnosis not present

## 2017-05-29 DIAGNOSIS — Z9989 Dependence on other enabling machines and devices: Secondary | ICD-10-CM | POA: Diagnosis not present

## 2017-06-01 ENCOUNTER — Ambulatory Visit (HOSPITAL_BASED_OUTPATIENT_CLINIC_OR_DEPARTMENT_OTHER)
Admission: RE | Admit: 2017-06-01 | Discharge: 2017-06-01 | Disposition: A | Discharge status: Home / Self Care | Payer: Medicare Other | Source: Ambulatory Visit | Attending: Orthopedic Surgery | Admitting: Orthopedic Surgery

## 2017-06-01 ENCOUNTER — Encounter (HOSPITAL_BASED_OUTPATIENT_CLINIC_OR_DEPARTMENT_OTHER)
Admission: RE | Disposition: A | Discharge status: Home / Self Care | Payer: Self-pay | Source: Ambulatory Visit | Attending: Orthopedic Surgery

## 2017-06-01 ENCOUNTER — Ambulatory Visit (HOSPITAL_BASED_OUTPATIENT_CLINIC_OR_DEPARTMENT_OTHER): Payer: Medicare Other | Admitting: Anesthesiology

## 2017-06-01 ENCOUNTER — Encounter (HOSPITAL_BASED_OUTPATIENT_CLINIC_OR_DEPARTMENT_OTHER): Payer: Self-pay | Admitting: Anesthesiology

## 2017-06-01 DIAGNOSIS — J449 Chronic obstructive pulmonary disease, unspecified: Secondary | ICD-10-CM | POA: Insufficient documentation

## 2017-06-01 DIAGNOSIS — E785 Hyperlipidemia, unspecified: Secondary | ICD-10-CM | POA: Insufficient documentation

## 2017-06-01 DIAGNOSIS — Z888 Allergy status to other drugs, medicaments and biological substances status: Secondary | ICD-10-CM | POA: Insufficient documentation

## 2017-06-01 DIAGNOSIS — Z6832 Body mass index (BMI) 32.0-32.9, adult: Secondary | ICD-10-CM | POA: Insufficient documentation

## 2017-06-01 DIAGNOSIS — G5622 Lesion of ulnar nerve, left upper limb: Secondary | ICD-10-CM | POA: Insufficient documentation

## 2017-06-01 DIAGNOSIS — G4730 Sleep apnea, unspecified: Secondary | ICD-10-CM | POA: Insufficient documentation

## 2017-06-01 DIAGNOSIS — Z85828 Personal history of other malignant neoplasm of skin: Secondary | ICD-10-CM | POA: Insufficient documentation

## 2017-06-01 DIAGNOSIS — Z8249 Family history of ischemic heart disease and other diseases of the circulatory system: Secondary | ICD-10-CM | POA: Insufficient documentation

## 2017-06-01 DIAGNOSIS — Z86010 Personal history of colonic polyps: Secondary | ICD-10-CM | POA: Insufficient documentation

## 2017-06-01 DIAGNOSIS — Z87891 Personal history of nicotine dependence: Secondary | ICD-10-CM | POA: Insufficient documentation

## 2017-06-01 DIAGNOSIS — M1990 Unspecified osteoarthritis, unspecified site: Secondary | ICD-10-CM | POA: Insufficient documentation

## 2017-06-01 DIAGNOSIS — I10 Essential (primary) hypertension: Secondary | ICD-10-CM | POA: Insufficient documentation

## 2017-06-01 DIAGNOSIS — K219 Gastro-esophageal reflux disease without esophagitis: Secondary | ICD-10-CM | POA: Insufficient documentation

## 2017-06-01 DIAGNOSIS — Z880 Allergy status to penicillin: Secondary | ICD-10-CM | POA: Insufficient documentation

## 2017-06-01 DIAGNOSIS — Z9989 Dependence on other enabling machines and devices: Secondary | ICD-10-CM | POA: Insufficient documentation

## 2017-06-01 DIAGNOSIS — Z981 Arthrodesis status: Secondary | ICD-10-CM | POA: Insufficient documentation

## 2017-06-01 HISTORY — PX: ULNAR NERVE TRANSPOSITION: SHX2595

## 2017-06-01 SURGERY — ULNAR NERVE DECOMPRESSION/TRANSPOSITION
Anesthesia: General | Site: Elbow | Laterality: Left

## 2017-06-01 MED ORDER — LACTATED RINGERS IV SOLN
INTRAVENOUS | Status: DC
  Administered 2017-06-01 (×2): via INTRAVENOUS

## 2017-06-01 MED ORDER — LIDOCAINE 2% (20 MG/ML) 5 ML SYRINGE
INTRAMUSCULAR | Status: AC
  Filled 2017-06-01: qty 5

## 2017-06-01 MED ORDER — FENTANYL CITRATE (PF) 100 MCG/2ML IJ SOLN
INTRAMUSCULAR | Status: AC
  Filled 2017-06-01: qty 2

## 2017-06-01 MED ORDER — ONDANSETRON HCL 4 MG/2ML IJ SOLN
INTRAMUSCULAR | Status: AC
  Filled 2017-06-01: qty 2

## 2017-06-01 MED ORDER — CHLORHEXIDINE GLUCONATE 4 % EX LIQD: 60.0000 mL | Freq: Once | CUTANEOUS | Status: DC

## 2017-06-01 MED ORDER — PROMETHAZINE HCL 25 MG/ML IJ SOLN: 6.2500 mg | INTRAMUSCULAR | Status: DC | PRN

## 2017-06-01 MED ORDER — HYDROCODONE-ACETAMINOPHEN 5-325 MG PO TABS: ORAL_TABLET | ORAL | 0 refills | Status: DC

## 2017-06-01 MED ORDER — DEXAMETHASONE SODIUM PHOSPHATE 10 MG/ML IJ SOLN
INTRAMUSCULAR | Status: AC
  Filled 2017-06-01: qty 1

## 2017-06-01 MED ORDER — FENTANYL CITRATE (PF) 100 MCG/2ML IJ SOLN
INTRAMUSCULAR | Status: DC | PRN
  Administered 2017-06-01 (×3): 25 ug via INTRAVENOUS

## 2017-06-01 MED ORDER — MIDAZOLAM HCL 2 MG/2ML IJ SOLN: 0.5000 mg | Freq: Once | INTRAMUSCULAR | Status: DC | PRN

## 2017-06-01 MED ORDER — BUPIVACAINE HCL (PF) 0.25 % IJ SOLN
INTRAMUSCULAR | Status: DC | PRN
  Administered 2017-06-01: 10 mL

## 2017-06-01 MED ORDER — DEXAMETHASONE SODIUM PHOSPHATE 4 MG/ML IJ SOLN
INTRAMUSCULAR | Status: DC | PRN
  Administered 2017-06-01: 10 mg via INTRAVENOUS

## 2017-06-01 MED ORDER — VANCOMYCIN HCL 1000 MG IV SOLR
INTRAVENOUS | Status: DC | PRN
  Administered 2017-06-01: 1000 mg via INTRAVENOUS

## 2017-06-01 MED ORDER — PROPOFOL 10 MG/ML IV BOLUS
INTRAVENOUS | Status: AC
  Filled 2017-06-01: qty 20

## 2017-06-01 MED ORDER — ONDANSETRON HCL 4 MG/2ML IJ SOLN
INTRAMUSCULAR | Status: DC | PRN
  Administered 2017-06-01: 4 mg via INTRAVENOUS

## 2017-06-01 MED ORDER — MEPERIDINE HCL 25 MG/ML IJ SOLN: 6.2500 mg | INTRAMUSCULAR | Status: DC | PRN

## 2017-06-01 MED ORDER — SCOPOLAMINE 1 MG/3DAYS TD PT72: 1.0000 | MEDICATED_PATCH | Freq: Once | TRANSDERMAL | Status: DC | PRN

## 2017-06-01 MED ORDER — LIDOCAINE HCL (CARDIAC) 20 MG/ML IV SOLN
INTRAVENOUS | Status: DC | PRN
  Administered 2017-06-01: 50 mg via INTRAVENOUS

## 2017-06-01 MED ORDER — HYDROMORPHONE HCL 1 MG/ML IJ SOLN: 0.2500 mg | INTRAMUSCULAR | Status: DC | PRN

## 2017-06-01 MED ORDER — FENTANYL CITRATE (PF) 100 MCG/2ML IJ SOLN
50.0000 ug | INTRAMUSCULAR | Status: DC | PRN
Start: 2017-06-01 — End: 2017-06-01

## 2017-06-01 MED ORDER — BUPIVACAINE HCL (PF) 0.25 % IJ SOLN
INTRAMUSCULAR | Status: AC
  Filled 2017-06-01: qty 60

## 2017-06-01 MED ORDER — PROPOFOL 10 MG/ML IV BOLUS
INTRAVENOUS | Status: DC | PRN
  Administered 2017-06-01 (×3): 30 mg via INTRAVENOUS

## 2017-06-01 MED ORDER — EPHEDRINE SULFATE 50 MG/ML IJ SOLN
INTRAMUSCULAR | Status: DC | PRN
  Administered 2017-06-01: 25 mg via INTRAVENOUS

## 2017-06-01 MED ORDER — EPHEDRINE 5 MG/ML INJ
INTRAVENOUS | Status: AC
  Filled 2017-06-01: qty 10

## 2017-06-01 MED ORDER — VANCOMYCIN HCL IN DEXTROSE 1-5 GM/200ML-% IV SOLN
INTRAVENOUS | Status: AC
  Filled 2017-06-01: qty 200

## 2017-06-01 MED ORDER — MIDAZOLAM HCL 2 MG/2ML IJ SOLN: 1.0000 mg | INTRAMUSCULAR | Status: DC | PRN

## 2017-06-01 MED ORDER — MIDAZOLAM HCL 2 MG/2ML IJ SOLN
INTRAMUSCULAR | Status: AC
  Filled 2017-06-01: qty 2

## 2017-06-01 MED ORDER — VANCOMYCIN HCL IN DEXTROSE 1-5 GM/200ML-% IV SOLN: 1000.0000 mg | INTRAVENOUS | Status: DC

## 2017-06-01 SURGICAL SUPPLY — 57 items
BANDAGE ACE 3X5.8 VEL STRL LF (GAUZE/BANDAGES/DRESSINGS) ×6 IMPLANT
BANDAGE ACE 4X5 VEL STRL LF (GAUZE/BANDAGES/DRESSINGS) ×3 IMPLANT
BLADE MINI RND TIP GREEN BEAV (BLADE) IMPLANT
BLADE SURG 15 STRL LF DISP TIS (BLADE) ×2 IMPLANT
BLADE SURG 15 STRL SS (BLADE) ×4
BNDG CMPR 9X4 STRL LF SNTH (GAUZE/BANDAGES/DRESSINGS) ×1
BNDG ESMARK 4X9 LF (GAUZE/BANDAGES/DRESSINGS) ×3 IMPLANT
BNDG GAUZE ELAST 4 BULKY (GAUZE/BANDAGES/DRESSINGS) ×3 IMPLANT
CHLORAPREP W/TINT 26ML (MISCELLANEOUS) ×3 IMPLANT
CORD BIPOLAR FORCEPS 12FT (ELECTRODE) ×3 IMPLANT
COVER BACK TABLE 60X90IN (DRAPES) ×3 IMPLANT
COVER MAYO STAND STRL (DRAPES) ×3 IMPLANT
CUFF TOURN SGL LL 18 NRW (TOURNIQUET CUFF) ×3 IMPLANT
CUFF TOURNIQUET SINGLE 18IN (TOURNIQUET CUFF) IMPLANT
DECANTER SPIKE VIAL GLASS SM (MISCELLANEOUS) IMPLANT
DRAPE EXTREMITY T 121X128X90 (DRAPE) ×3 IMPLANT
DRAPE SURG 17X23 STRL (DRAPES) ×3 IMPLANT
DRSG PAD ABDOMINAL 8X10 ST (GAUZE/BANDAGES/DRESSINGS) ×3 IMPLANT
GAUZE SPONGE 4X4 12PLY STRL (GAUZE/BANDAGES/DRESSINGS) ×3 IMPLANT
GAUZE SPONGE 4X4 16PLY XRAY LF (GAUZE/BANDAGES/DRESSINGS) IMPLANT
GAUZE XEROFORM 1X8 LF (GAUZE/BANDAGES/DRESSINGS) ×3 IMPLANT
GLOVE BIO SURGEON STRL SZ 6.5 (GLOVE) ×2 IMPLANT
GLOVE BIO SURGEON STRL SZ7.5 (GLOVE) ×3 IMPLANT
GLOVE BIO SURGEONS STRL SZ 6.5 (GLOVE) ×1
GLOVE BIOGEL PI IND STRL 7.0 (GLOVE) ×2 IMPLANT
GLOVE BIOGEL PI IND STRL 8 (GLOVE) ×1 IMPLANT
GLOVE BIOGEL PI IND STRL 8.5 (GLOVE) ×1 IMPLANT
GLOVE BIOGEL PI INDICATOR 7.0 (GLOVE) ×4
GLOVE BIOGEL PI INDICATOR 8 (GLOVE) ×2
GLOVE BIOGEL PI INDICATOR 8.5 (GLOVE) ×2
GLOVE SURG ORTHO 8.0 STRL STRW (GLOVE) ×3 IMPLANT
GOWN STRL REUS W/ TWL LRG LVL3 (GOWN DISPOSABLE) ×1 IMPLANT
GOWN STRL REUS W/TWL LRG LVL3 (GOWN DISPOSABLE) ×2
GOWN STRL REUS W/TWL XL LVL3 (GOWN DISPOSABLE) ×6 IMPLANT
NEEDLE HYPO 25X1 1.5 SAFETY (NEEDLE) ×3 IMPLANT
NS IRRIG 1000ML POUR BTL (IV SOLUTION) ×3 IMPLANT
PACK BASIN DAY SURGERY FS (CUSTOM PROCEDURE TRAY) ×3 IMPLANT
PAD CAST 3X4 CTTN HI CHSV (CAST SUPPLIES) ×1 IMPLANT
PAD CAST 4YDX4 CTTN HI CHSV (CAST SUPPLIES) ×1 IMPLANT
PADDING CAST ABS 4INX4YD NS (CAST SUPPLIES) ×2
PADDING CAST ABS COTTON 4X4 ST (CAST SUPPLIES) ×1 IMPLANT
PADDING CAST COTTON 3X4 STRL (CAST SUPPLIES) ×2
PADDING CAST COTTON 4X4 STRL (CAST SUPPLIES) ×2
SLEEVE SCD COMPRESS KNEE MED (MISCELLANEOUS) ×3 IMPLANT
SPLINT FAST PLASTER 5X30 (CAST SUPPLIES)
SPLINT PLASTER CAST FAST 5X30 (CAST SUPPLIES) IMPLANT
SPLINT PLASTER CAST XFAST 3X15 (CAST SUPPLIES) IMPLANT
SPLINT PLASTER XTRA FASTSET 3X (CAST SUPPLIES)
STOCKINETTE 4X48 STRL (DRAPES) ×3 IMPLANT
SUT ETHILON 4 0 PS 2 18 (SUTURE) ×3 IMPLANT
SUT VIC AB 2-0 SH 27 (SUTURE) ×2
SUT VIC AB 2-0 SH 27XBRD (SUTURE) ×1 IMPLANT
SUT VICRYL 4-0 PS2 18IN ABS (SUTURE) IMPLANT
SYR BULB 3OZ (MISCELLANEOUS) ×3 IMPLANT
SYR CONTROL 10ML LL (SYRINGE) ×3 IMPLANT
TOWEL OR 17X24 6PK STRL BLUE (TOWEL DISPOSABLE) ×3 IMPLANT
UNDERPAD 30X30 (UNDERPADS AND DIAPERS) ×3 IMPLANT

## 2017-06-01 NOTE — H&P (Signed)
Todd [name] is an 76 y.o. male.   Chief Complaint: left ulnar nerve compression HPI: 76 yo male with numbness in ulnar border of bilateral hands.  Positive nerve conduction studies and ultrasound.  He wishes to have decompression of ulnar nerve at elbow.  Allergies:  Allergies  Allergen Reactions  . Ace Inhibitors Cough  . Amoxicillin Nausea And Vomiting  . Darvon     Chest pain  . Toprol Xl [Metoprolol Tartrate]     Past Medical History:  Diagnosis Date  . Allergy   . Angioedema   . Arthritis   . 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  [facility].  . Bladder stone   . Cancer ([place])    skin cancer multiple; followed by [facility]matology yearly.  . Cataract    4 YEARS - BILATERAL  . Colon polyps     Previous polyps on colonoscopy.  [facility].    . Hiatal hernia 10/20/2013   EGD confirmed.  [facility].  . Hyperlipemia   . Hypertension    no meds  . Neuromuscular disorder (HCC)    numbness rt hand  . Seasonal allergies   . Sleep apnea    uses CPAP nightly    Past Surgical History:  Procedure Laterality Date  . BLADDER STONE REMOVAL  2011, 2018  . CARPAL TUNNEL RELEASE  01/17/2012   Procedure: CARPAL TUNNEL RELEASE;  Surgeon: Cammie Sickle., MD;  Location: [facility];  Service: Orthopedics;  Laterality: Right;  . [place]  . COLONOSCOPY  10/20/2013   [facility].  Polyps.   . CYSTOSCOPY WITH LITHOLAPAXY N/A 01/09/2017   Procedure: CYSTOSCOPY WITH LITHOLAPAXY;  Surgeon: Franchot Gallo, MD;  [facility];  Service: Urology;  Laterality: N/A;  . ESOPHAGOGASTRODUODENOSCOPY  10/20/2013   [facility]; biopsy of area concernig for Barrett's; gastritis.  Marland Kitchen EYE SURGERY  2012   both cataracts  . FRACTURE SURGERY     3 fingers  . HOLMIUM LASER APPLICATION N/A 02/05/6605   Procedure: HOLMIUM LASER APPLICATION;  Surgeon: Franchot Gallo, MD;  [facility];  Service: Urology;  Laterality: N/A;  . QUADRICEPS TENDON  REPAIR  2003   bilateral  . ULNAR NERVE TRANSPOSITION  01/17/2012   Procedure: ULNAR NERVE DECOMPRESSION/TRANSPOSITION;  Surgeon: Cammie Sickle., MD;  Location: [facility];  Service: Orthopedics;  Laterality: Right;  decompression of ulnar nerve only  at right cubital tunnel  . UPPER GASTROINTESTINAL ENDOSCOPY    . VASECTOMY      Family History: Family History  Problem Relation Age of Onset  . Cirrhosis Father   . Hypertension Father   . Alcohol abuse Father   . Heart failure Mother        in 84s  . Arthritis Brother        Rheumatoid arthritis  . Hypertension Brother     Social History:   reports that he quit smoking about 43 years ago. His smoking use included Cigarettes. He has a 30.00 pack-year smoking history. He has never used smokeless tobacco. He reports that he drinks about 0.5 oz of alcohol per week . He reports that he does not use drugs.  Medications: No prescriptions prior to admission.    No results found for this or any previous visit (from the past 48 hour(s)).  No results found.   A comprehensive review of systems was negative.  Height 5\' 11"  (1.803 m), weight 102.1 kg (225 lb).  General appearance: alert, cooperative and appears stated age Head: Normocephalic, without obvious abnormality, atraumatic Neck: supple, symmetrical, trachea midline Resp: clear to auscultation bilaterally Cardio: regular rate and rhythm GI: non-tender Extremities: Intact sensation and capillary refill all digits.  +epl/fpl/io.  No wounds.  Pulses: 2+ and symmetric Skin: Skin color, texture, turgor normal. No rashes or lesions Neurologic: Grossly normal Incision/Wound:none  Assessment/Plan Left ulnar nerve compression at elbow.  Non operative and operative treatment options were discussed with the patient and patient wishes to proceed with operative treatment. Risks, benefits, and alternatives of surgery were discussed and the patient agrees with the plan  of care.   Teagen Mcleary R 06/01/2017, 10:02 AM

## 2017-06-01 NOTE — Anesthesia Procedure Notes (Signed)
Procedure Name: LMA Insertion Date/Time: 06/01/2017 12:08 PM Performed by: Toula Moos L Pre-anesthesia Checklist: Patient identified, Emergency Drugs available, Suction available, Patient being monitored and Timeout performed Patient Re-evaluated:Patient Re-evaluated prior to induction Oxygen Delivery Method: Circle system utilized Preoxygenation: Pre-oxygenation with 100% oxygen Induction Type: IV induction Ventilation: Mask ventilation without difficulty LMA: LMA inserted LMA Size: 5.0 Number of attempts: 1 Airway Equipment and Method: Bite block Placement Confirmation: positive ETCO2 Tube secured with: Tape Dental Injury: Teeth and Oropharynx as per pre-operative assessment

## 2017-06-01 NOTE — Anesthesia Preprocedure Evaluation (Addendum)
Anesthesia Evaluation  Patient identified by MRN, date of birth, ID band Patient awake    Reviewed: Allergy & Precautions, NPO status , Patient's Chart, lab work & pertinent test results  History of Anesthesia Complications Negative for: history of anesthetic complications  Airway Mallampati: II  TM Distance: >3 FB Neck ROM: Full    Dental  (+) Implants, Dental Advisory Given   Pulmonary sleep apnea and Continuous Positive Airway Pressure Ventilation , COPD, former smoker (quit 1975),    breath sounds clear to auscultation       Cardiovascular hypertension, Pt. on medications (-) angina Rhythm:Regular Rate:Normal  '14 stress test: normal '14 ECHO: EF 60%, valves OK   Neuro/Psych negative neurological ROS     GI/Hepatic Neg liver ROS, hiatal hernia, GERD  Controlled,  Endo/Other  Morbid obesity  Renal/GU negative Renal ROS     Musculoskeletal  (+) Arthritis ,   Abdominal (+) + obese,   Peds  Hematology negative hematology ROS (+)   Anesthesia Other Findings   Reproductive/Obstetrics                            Anesthesia Physical Anesthesia Plan  ASA: III  Anesthesia Plan: General   Post-op Pain Management:    Induction:   PONV Risk Score and Plan: 2 and Ondansetron and Dexamethasone  Airway Management Planned: LMA  Additional Equipment:   Intra-op Plan:   Post-operative Plan:   Informed Consent: I have reviewed the patients History and Physical, chart, labs and discussed the procedure including the risks, benefits and alternatives for the proposed anesthesia with the patient or authorized representative who has indicated his/her understanding and acceptance.   Dental advisory given  Plan Discussed with: CRNA and Surgeon  Anesthesia Plan Comments: (Plan routine monitors, GA- LMA OK)        Anesthesia Quick Evaluation

## 2017-06-01 NOTE — Discharge Instructions (Addendum)

## 2017-06-01 NOTE — Op Note (Signed)
I assisted Surgeon(s) and Role:    Leanora Cover, MD - Primary on the Procedure(s): DECOMPRESSION ULNAR NERVE LEFT CUBITAL TUNNEL on 06/01/2017.  I provided assistance on this case as follows: setup Approach, retaction positioning or the arm and support, identification of the nerve, decompression , closure and application of the dressing. I was present for the entire case.  Electronically signed by: Wynonia Sours, MD Date: 06/01/2017 Time: 12:56 PM

## 2017-06-01 NOTE — Anesthesia Postprocedure Evaluation (Signed)
Anesthesia Post Note  Patient: Todd [name]  Procedure(s) Performed: Procedure(s) (LRB): DECOMPRESSION ULNAR NERVE LEFT CUBITAL TUNNEL (Left)     Patient location during evaluation: PACU Anesthesia Type: General Level of consciousness: awake and alert Pain management: pain level controlled Vital Signs Assessment: post-procedure vital signs reviewed and stable Respiratory status: spontaneous breathing, nonlabored ventilation and respiratory function stable Cardiovascular status: blood pressure returned to baseline and stable Postop Assessment: no signs of nausea or vomiting Anesthetic complications: no    Last Vitals:  Vitals:   06/01/17 1330 06/01/17 1359  BP: 116/82 139/80  Pulse: 79 82  Resp: 18 16  Temp: (!) 36.4 C 36.5 C    Last Pain:  Vitals:   06/01/17 1330  TempSrc:   PainSc: 0-No pain                 Catalina Gravel

## 2017-06-01 NOTE — Op Note (Signed)
NAMEHAIDYN, CHADDERDON NO.:  [id number]  MEDICAL RECORD NO.:  [id number]  LOCATION:                                 FACILITY:  PHYSICIAN:  Leanora Cover, MD             DATE OF BIRTH:  DATE OF PROCEDURE:  06/01/2017 DATE OF DISCHARGE:                              OPERATIVE REPORT   PREOPERATIVE DIAGNOSIS:  Left elbow ulnar nerve compression at the elbow.  POSTOPERATIVE DIAGNOSIS:  Left elbow ulnar nerve compression at the elbow.  PROCEDURE:  Decompression of ulnar nerve at elbow.  SURGEON:  Leanora Cover, MD  ASSISTANT:  Daryll Brod, MD.  ANESTHESIA:  General.  IV FLUIDS:  Per anesthesia flow sheet.  ESTIMATED BLOOD LOSS:  Minimal.  COMPLICATIONS:  None.  SPECIMENS:  None.  TOURNIQUET TIME:  27 minutes.  DISPOSITION:  Stable to PACU.  INDICATIONS:  Mr. Dibari is a 76 year old male who has had tingling in the ulnar side of his left hand.  He has positive nerve conduction studies. He wishes to undergo an operative decompression of the ulnar nerve at the elbow.  Risks, benefits and alternative of the surgery were discussed including the risk of blood loss; infection; damage to nerves, vessels, tendons, ligaments, bone; failure of surgery; need for additional surgery; complications with wound healing; continued pain and continued numbness.  He voiced understanding of these risks and elected to proceed.  OPERATIVE COURSE:  After being identified preoperatively by myself, the patient and I agreed upon the procedure and site of procedure.  Surgical site was marked.  Risks, benefits and alternatives of the surgery were reviewed and he wished to proceed.  Surgical consent had been signed. He was given IV vancomycin as preoperative antibiotic prophylaxis.  He was transported to the operating room and placed on the operating room table in supine position with left upper extremity on an armboard. General anesthesia was induced by anesthesiologist.  Left  upper extremity was prepped and draped in normal sterile orthopedic fashion. A surgical pause was performed between surgeons, Anesthesia and operating staff, and all were in agreement into patient, procedure and site of procedure.  Tourniquet at the proximal aspect of the extremity was inflated to 250 mmHg after exsanguination of the limb with an Esmarch bandage.  Incision was made at the medial side of the elbow and carried into subcutaneous tissues by spreading technique.  Care was taken to protect cutaneous branches of nerve.  The Osborne's ligament was identified.  The nerve was identified just proximal to this.  The nerve was then decompressed proximally.  A finger was placed into the wound to ensure complete decompression proximally.  The Osborne's ligament was split and the nerve decompressed distally including underneath the FCU muscle.  The finger was again placed into the wound to ensure complete decompression, which was the case.  The elbow was placed through range of motion.  There was no subluxation of the nerve. The anterior portion of Osborne's ligament was then sutured to the posterior skin leaflet to prevent any subluxation.  An instrument was placed underneath to ensure that there was no compression of the nerve.  The wound had been copiously irrigated with sterile saline.  It was closed with 4-0 nylon in a horizontal mattress fashion.  It was injected with 10 mL of 0.25% plain Marcaine to aid in postoperative analgesia. It was then dressed with sterile Xeroform, 4x4s, and ABD and wrapped with Kerlix and Ace bandage.  Tourniquet was deflated at 27 minutes. Fingertips were pink with brisk capillary refill after deflation of the tourniquet.  The operative drapes were broken down, the patient was awakened from anesthesia safely.  He was transferred back to the stretcher and taken to PACU in stable condition.  I will see him back in the office in 1 week for postoperative  followup.  I will give him Norco 5/325 one to two p.o. q.6 hours p.r.n. pain, dispensed #20.     Leanora Cover, MD     KK/MEDQ  D:  06/01/2017  T:  06/01/2017  Job:  [id number]

## 2017-06-01 NOTE — Brief Op Note (Signed)
06/01/2017  12:52 PM  PATIENT:  Despina Hick  76 y.o. male  PRE-OPERATIVE DIAGNOSIS:  LEFT UNAR NERVE COMPRESSION AT CUBITAL TUNNEL  POST-OPERATIVE DIAGNOSIS:  LEFT Marylouise Stacks NERVE COMPRESSION AT CUBITAL TUNNEL  PROCEDURE:  Procedure(s): DECOMPRESSION ULNAR NERVE LEFT CUBITAL TUNNEL (Left)  SURGEON:  Surgeon(s) and Role:    Leanora Cover, MD - Primary  PHYSICIAN ASSISTANT:   ASSISTANTS: Daryll Brod, MD   ANESTHESIA:   general  EBL:  Total I/O In: 1000 [I.V.:1000] Out: -   BLOOD ADMINISTERED:none  DRAINS: none   LOCAL MEDICATIONS USED:  MARCAINE     SPECIMEN:  No Specimen  DISPOSITION OF SPECIMEN:  N/A  COUNTS:  YES  TOURNIQUET:   Total Tourniquet Time Documented: Upper Arm (Left) - 27 minutes Total: Upper Arm (Left) - 27 minutes   DICTATION: .Other Dictation: Dictation Number [id number]  PLAN OF CARE: Discharge to home after PACU  PATIENT DISPOSITION:  PACU - hemodynamically stable.

## 2017-06-01 NOTE — Op Note (Signed)
[id number] 

## 2017-06-01 NOTE — Transfer of Care (Signed)
Immediate Anesthesia Transfer of Care Note  Patient: Todd [name]  Procedure(s) Performed: Procedure(s): DECOMPRESSION ULNAR NERVE LEFT CUBITAL TUNNEL (Left)  Patient Location: PACU  Anesthesia Type:General  Level of Consciousness: awake and patient cooperative  Airway & Oxygen Therapy: Patient Spontanous Breathing and Patient connected to face mask oxygen  Post-op Assessment: Report given to RN and Post -op Vital signs reviewed and stable  Post vital signs: Reviewed and stable  Last Vitals:  Vitals:   06/01/17 1052  BP: (!) 144/75  Pulse: 70  Resp: 14  Temp: 36.7 C    Last Pain:  Vitals:   06/01/17 1052  TempSrc: Oral         Complications: No apparent anesthesia complications

## 2017-06-02 ENCOUNTER — Encounter (HOSPITAL_BASED_OUTPATIENT_CLINIC_OR_DEPARTMENT_OTHER): Payer: Self-pay | Admitting: Orthopedic Surgery

## 2017-06-02 NOTE — Addendum Note (Signed)
Addendum  created 06/02/17 0947 by Tawni Millers, CRNA   Charge Capture section accepted

## 2017-08-04 ENCOUNTER — Ambulatory Visit (INDEPENDENT_AMBULATORY_CARE_PROVIDER_SITE_OTHER): Payer: Medicare Other | Admitting: Family Medicine

## 2017-08-04 ENCOUNTER — Encounter: Payer: Self-pay | Admitting: Family Medicine

## 2017-08-04 VITALS — BP 120/80 | HR 78 | Temp 97.9°F | Resp 18 | Ht 70.55 in | Wt 232.2 lb

## 2017-08-04 DIAGNOSIS — Z23 Encounter for immunization: Secondary | ICD-10-CM

## 2017-08-04 DIAGNOSIS — R42 Dizziness and giddiness: Secondary | ICD-10-CM | POA: Diagnosis not present

## 2017-08-04 NOTE — Patient Instructions (Signed)
     IF you received an x-ray today, you will receive an invoice from [facility]. Please contact [facility] at [phone] with questions or concerns regarding your invoice.   IF you received labwork today, you will receive an invoice from [facility]. Please contact [facility] at [phone] with questions or concerns regarding your invoice.   Our billing staff will not be able to assist you with questions regarding bills from these companies.  You will be contacted with the lab results as soon as they are available. The fastest way to get your results is to activate your My Chart account. Instructions are located on the last page of this paperwork. If you have not heard from us regarding the results in 2 weeks, please contact this office.     

## 2017-08-04 NOTE — Progress Notes (Signed)
9/29/20188:03 AM  Todd [name] 1941/02/27, 75 y.o. male [id number]  Chief Complaint  Patient presents with  . Dizziness    X 2 days    HPI:   Patient is a 76 y.o. male who presents today for dizziness that started 2 days ago. He states that his actual first episode was about 2 weeks ago, when he felt some room spinning sensation when he looked up or would bend over, took some meclizine and it went away, until 2 days ago when it started again not so much room spinning sensation more of just imbalance/swaying when he stood up. Started meclizine again and epley maneuver. Feels symptoms are getting better. He is not dizzy now.  Depression screen [facility] 2/9 08/04/2017 04/12/2017 02/21/2017  Decreased Interest 0 0 0  Down, Depressed, Hopeless 0 0 0  PHQ - 2 Score 0 0 0    Allergies  Allergen Reactions  . Ace Inhibitors Cough  . Amoxicillin Nausea And Vomiting  . Darvon     Chest pain  . Toprol Xl [Metoprolol Tartrate]     Current Outpatient Prescriptions on File Prior to Visit  Medication Sig Dispense Refill  . atorvastatin (LIPITOR) 20 MG tablet Take 0.5 tablets (10 mg total) by mouth daily. 90 tablet 3  . cetirizine (ZYRTEC) 10 MG tablet Take 1 tablet (10 mg total) by mouth at bedtime. (Patient taking differently: Take 10 mg by mouth daily. ) 90 tablet 3  . Digestive Enzymes (PAPAYA AND ENZYMES PO) Take by mouth.    . fish oil-omega-3 fatty acids 1000 MG capsule Take 1 g by mouth 2 (two) times daily.     . fluticasone (FLONASE) 50 MCG/ACT nasal spray Place 2 sprays into both nostrils daily. AS NEEDED 16 g 11  . GARLIC PO Take 829 mg by mouth 2 (two) times daily.     Marland Kitchen losartan (COZAAR) 50 MG tablet Take 1 tablet (50 mg total) by mouth daily. 90 tablet 3  . Misc Natural Products (GLUCOSAMINE CHOND COMPLEX/MSM PO) Take by mouth.    . Multiple Vitamin (MULTIVITAMIN) tablet Take 1 tablet by mouth daily.    . saw palmetto 160 MG capsule Take 160 mg by mouth 2 (two) times daily.    Marland Kitchen  HYDROcodone-acetaminophen (NORCO) 5-325 MG tablet 1-2 tabs po q6 hours prn pain (Patient not taking: Reported on 08/04/2017) 20 tablet 0   No current facility-administered medications on file prior to visit.     Past Medical History:  Diagnosis Date  . Allergy   . Angioedema   . Arthritis   . 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  [facility].  . Bladder stone   . Cancer ([place])    skin cancer multiple; followed by [facility]/dermatology yearly.  . Cataract    4 YEARS - BILATERAL  . Colon polyps     Previous polyps on colonoscopy.  [facility].    . Hiatal hernia 10/20/2013   EGD confirmed.  [facility].  . Hyperlipemia   . Hypertension    no meds  . Neuromuscular disorder ([facility])    numbness rt hand  . Seasonal allergies   . Sleep apnea    uses CPAP nightly    Past Surgical History:  Procedure Laterality Date  . BLADDER STONE REMOVAL  2011, 2018  . CARPAL TUNNEL RELEASE  01/17/2012   Procedure: CARPAL TUNNEL RELEASE;  Surgeon: Cammie Sickle., MD;  Location: [facility];  Service: Orthopedics;  Laterality: Right;  .  CERVICAL FUSION  1976  . COLONOSCOPY  10/20/2013   [facility].  Polyps.   . CYSTOSCOPY WITH LITHOLAPAXY N/A 01/09/2017   Procedure: CYSTOSCOPY WITH LITHOLAPAXY;  Surgeon: Franchot Gallo, MD;  Location[facility];  Service: Urology;  Laterality: N/A;  . ESOPHAGOGASTRODUODENOSCOPY  10/20/2013   [facility]; biopsy of area concernig for Barrett's; gastritis.  Marland Kitchen EYE SURGERY  2012   both cataracts  . FRACTURE SURGERY     3 fingers  . HOLMIUM LASER APPLICATION N/A 2/0/2542   Procedure: HOLMIUM LASER APPLICATION;  Surgeon: Franchot Gallo, MD;  Location[facility];  Service: Urology;  Laterality: N/A;  . QUADRICEPS TENDON REPAIR  2003   bilateral  . ULNAR NERVE TRANSPOSITION  01/17/2012   Procedure: ULNAR NERVE DECOMPRESSION/TRANSPOSITION;  Surgeon: Cammie Sickle., MD;  Location: [facility];  Service: Orthopedics;   Laterality: Right;  decompression of ulnar nerve only  at right cubital tunnel  . ULNAR NERVE TRANSPOSITION Left 06/01/2017   Procedure: DECOMPRESSION ULNAR NERVE LEFT CUBITAL TUNNEL;  Surgeon: Leanora Cover, MD;  Location: [place];  Service: Orthopedics;  Laterality: Left;  . UPPER GASTROINTESTINAL ENDOSCOPY    . VASECTOMY      Social History  Substance Use Topics  . Smoking status: Former Smoker    Packs/day: 2.00    Years: 15.00    Types: Cigarettes    Quit date: 01/11/1974  . Smokeless tobacco: Never Used  . Alcohol use 0.5 oz/week    1 Standard drinks or equivalent per week     Comment: occ    Family History  Problem Relation Age of Onset  . Cirrhosis Father   . Hypertension Father   . Alcohol abuse Father   . Heart failure Mother        in 67s  . Arthritis Brother        Rheumatoid arthritis  . Hypertension Brother     Review of Systems  Constitutional: Negative for chills and fever.  HENT: Positive for tinnitus. Negative for congestion, ear discharge, ear pain, hearing loss, sinus pain and sore throat.   Respiratory: Negative for cough and shortness of breath.   Cardiovascular: Negative for chest pain, palpitations and leg swelling.  Gastrointestinal: Negative for abdominal pain, nausea and vomiting.  Neurological: Positive for dizziness. Negative for sensory change, speech change, focal weakness and headaches.     OBJECTIVE:  Blood pressure 120/80, pulse 78, temperature 97.9 F (36.6 C), temperature source Oral, resp. rate 18, height 5' 10.55" (1.792 m), weight 232 lb 3.2 oz (105.3 kg), SpO2 96 %.  Physical Exam  Constitutional: He is oriented to person, place, and time and well-developed, well-nourished, and in no distress.  HENT:  Head: Normocephalic and atraumatic.  Right Ear: Hearing, tympanic membrane, external ear and ear canal normal.  Left Ear: Hearing, tympanic membrane, external ear and ear canal normal.  Nose: Mucosal edema  present.  Mouth/Throat: Oropharynx is clear and moist. No oropharyngeal exudate.  Eyes: Pupils are equal, round, and reactive to light. Conjunctivae and EOM are normal.  Neck: Neck supple.  Cardiovascular: Normal rate, regular rhythm, normal heart sounds and intact distal pulses.  Exam reveals no gallop and no friction rub.   No murmur heard. Pulmonary/Chest: Effort normal and breath sounds normal. He has no wheezes. He has no rales.  Lymphadenopathy:    He has no cervical adenopathy.  Neurological: He is alert and oriented to person, place, and time. He has normal strength,  normal reflexes and intact cranial nerves. He has a normal Cerebellar Exam and a normal Romberg Test. He shows no pronator drift. Gait normal.  Skin: Skin is warm and dry.  Psychiatric: Mood and affect normal.    ASSESSMENT and PLAN  1. Vertigo Mild, resolving. Continue with meclizine use prn. RTC precautions given.   2. Need for influenza vaccination - Flu Vaccine QUAD 36+ mos IM   Return if symptoms worsen or fail to improve.    Rutherford Guys, MD Primary [facility] at [facility], [place] [address] Ph.  [phone] Fax [phone]

## 2017-08-16 ENCOUNTER — Telehealth: Payer: Self-pay | Admitting: Family Medicine

## 2017-08-16 NOTE — Telephone Encounter (Signed)
PATIENT WOULD LIKE TO LET DR. Tamala Julian KNOW THAT THE SIDE EFFECTS OF TAKING THE LOSARTAN 50 MG ARE GETTING A LITTLE WORSE. HE IS HAVING SOME DIZZY SPELLS AND IT IS EFFECTING THE MUSCLES IN HIS LEGS WHEN HE TRIES TO WALK AT TIMES. WHAT DOES SHE THINK HE SHOULD DO? HE SAID HE HAS BEEN TAKING THIS MEDICINE AT LEAST A COUPLE OF YEARS. BEST PHONE [phone] (CELL) PHARMACY CHOICE IF NEEDED IS [facility] ON [address]. [place]

## 2017-08-17 NOTE — Telephone Encounter (Signed)
Please advise 

## 2017-08-18 NOTE — Telephone Encounter (Signed)
What is blood pressure running on Losartan?

## 2017-08-22 ENCOUNTER — Ambulatory Visit (INDEPENDENT_AMBULATORY_CARE_PROVIDER_SITE_OTHER): Payer: Medicare Other

## 2017-08-22 ENCOUNTER — Ambulatory Visit: Payer: Medicare Other

## 2017-08-22 ENCOUNTER — Telehealth: Payer: Self-pay

## 2017-08-22 VITALS — BP 122/70 | HR 68 | Temp 98.1°F | Ht 70.55 in | Wt 230.1 lb

## 2017-08-22 DIAGNOSIS — Z0000 Encounter for general adult medical examination without abnormal findings: Secondary | ICD-10-CM

## 2017-08-22 NOTE — Patient Instructions (Addendum)
[name] , Thank you for taking time to come for your Medicare Wellness Visit. I appreciate your ongoing commitment to your health goals. Please review the following plan we discussed and let me know if I can assist you in the future.   Screening recommendations/referrals: Colonoscopy: up to date, next due 10/21/2023 Recommended yearly ophthalmology/optometry visit for glaucoma screening and checkup Recommended yearly dental visit for hygiene and checkup  Vaccinations: Influenza vaccine: up to date Pneumococcal vaccine: up to date Tdap vaccine: up to date, next due 05/23/2024 Shingles vaccine: up to date    Advanced directives: Please bring a copy of your POA (Power of [place]) and/or Living Will to your next appointment.   Conditions/risks identified: Try to lose weight and get to 200 lbs in the near future.  Next appointment: 08/28/17 @ 8 am with Dr. Tamala Julian   Preventive Care 65 Years and Older, Male Preventive care refers to lifestyle choices and visits with your health care provider that can promote health and wellness. What does preventive care include?  A yearly physical exam. This is also called an annual well check.  Dental exams once or twice a year.  Routine eye exams. Ask your health care provider how often you should have your eyes checked.  Personal lifestyle choices, including:  Daily care of your teeth and gums.  Regular physical activity.  Eating a healthy diet.  Avoiding tobacco and drug use.  Limiting alcohol use.  Practicing safe sex.  Taking low doses of aspirin every day.  Taking vitamin and mineral supplements as recommended by your health care provider. What happens during an annual well check? The services and screenings done by your health care provider during your annual well check will depend on your age, overall health, lifestyle risk factors, and family history of disease. Counseling  Your health care provider may ask you questions about  your:  Alcohol use.  Tobacco use.  Drug use.  Emotional well-being.  Home and relationship well-being.  Sexual activity.  Eating habits.  History of falls.  Memory and ability to understand (cognition).  Work and work Statistician. Screening  You may have the following tests or measurements:  Height, weight, and BMI.  Blood pressure.  Lipid and cholesterol levels. These may be checked every 5 years, or more frequently if you are over 23 years old.  Skin check.  Lung cancer screening. You may have this screening every year starting at age 11 if you have a 30-pack-year history of smoking and currently smoke or have quit within the past 15 years.  Fecal occult blood test (FOBT) of the stool. You may have this test every year starting at age 80.  Flexible sigmoidoscopy or colonoscopy. You may have a sigmoidoscopy every 5 years or a colonoscopy every 10 years starting at age 74.  Prostate cancer screening. Recommendations will vary depending on your family history and other risks.  Hepatitis C blood test.  Hepatitis B blood test.  Sexually transmitted disease (STD) testing.  Diabetes screening. This is done by checking your blood sugar (glucose) after you have not eaten for a while (fasting). You may have this done every 1-3 years.  Abdominal aortic aneurysm (AAA) screening. You may need this if you are a current or former smoker.  Osteoporosis. You may be screened starting at age 32 if you are at high risk. Talk with your health care provider about your test results, treatment options, and if necessary, the need for more tests. Vaccines  Your health  care provider may recommend certain vaccines, such as:  Influenza vaccine. This is recommended every year.  Tetanus, diphtheria, and acellular pertussis (Tdap, Td) vaccine. You may need a Td booster every 10 years.  Zoster vaccine. You may need this after age 42.  Pneumococcal 13-valent conjugate (PCV13) vaccine.  One dose is recommended after age 70.  Pneumococcal polysaccharide (PPSV23) vaccine. One dose is recommended after age 87. Talk to your health care provider about which screenings and vaccines you need and how often you need them. This information is not intended to replace advice given to you by your health care provider. Make sure you discuss any questions you have with your health care provider. Document Released: 11/20/2015 Document Revised: 07/13/2016 Document Reviewed: 08/25/2015 Elsevier Interactive Patient Education  2017 [employer] Prevention in the Home Falls can cause injuries. They can happen to people of all ages. There are many things you can do to make your home safe and to help prevent falls. What can I do on the outside of my home?  Regularly fix the edges of walkways and driveways and fix any cracks.  Remove anything that might make you trip as you walk through a door, such as a raised step or threshold.  Trim any bushes or trees on the path to your home.  Use bright outdoor lighting.  Clear any walking paths of anything that might make someone trip, such as rocks or tools.  Regularly check to see if handrails are loose or broken. Make sure that both sides of any steps have handrails.  Any raised decks and porches should have guardrails on the edges.  Have any leaves, snow, or ice cleared regularly.  Use sand or salt on walking paths during winter.  Clean up any spills in your garage right away. This includes oil or grease spills. What can I do in the bathroom?  Use night lights.  Install grab bars by the toilet and in the tub and shower. Do not use towel bars as grab bars.  Use non-skid mats or decals in the tub or shower.  If you need to sit down in the shower, use a plastic, non-slip stool.  Keep the floor dry. Clean up any water that spills on the floor as soon as it happens.  Remove soap buildup in the tub or shower regularly.  Attach bath  mats securely with double-sided non-slip rug tape.  Do not have throw rugs and other things on the floor that can make you trip. What can I do in the bedroom?  Use night lights.  Make sure that you have a light by your bed that is easy to reach.  Do not use any sheets or blankets that are too big for your bed. They should not hang down onto the floor.  Have a firm chair that has side arms. You can use this for support while you get dressed.  Do not have throw rugs and other things on the floor that can make you trip. What can I do in the kitchen?  Clean up any spills right away.  Avoid walking on wet floors.  Keep items that you use a lot in easy-to-reach places.  If you need to reach something above you, use a strong step stool that has a grab bar.  Keep electrical cords out of the way.  Do not use floor polish or wax that makes floors slippery. If you must use wax, use non-skid floor wax.  Do not have  throw rugs and other things on the floor that can make you trip. What can I do with my stairs?  Do not leave any items on the stairs.  Make sure that there are handrails on both sides of the stairs and use them. Fix handrails that are broken or loose. Make sure that handrails are as long as the stairways.  Check any carpeting to make sure that it is firmly attached to the stairs. Fix any carpet that is loose or worn.  Avoid having throw rugs at the top or bottom of the stairs. If you do have throw rugs, attach them to the floor with carpet tape.  Make sure that you have a light switch at the top of the stairs and the bottom of the stairs. If you do not have them, ask someone to add them for you. What else can I do to help prevent falls?  Wear shoes that:  Do not have high heels.  Have rubber bottoms.  Are comfortable and fit you well.  Are closed at the toe. Do not wear sandals.  If you use a stepladder:  Make sure that it is fully opened. Do not climb a closed  stepladder.  Make sure that both sides of the stepladder are locked into place.  Ask someone to hold it for you, if possible.  Clearly mark and make sure that you can see:  Any grab bars or handrails.  First and last steps.  Where the edge of each step is.  Use tools that help you move around (mobility aids) if they are needed. These include:  Canes.  Walkers.  Scooters.  Crutches.  Turn on the lights when you go into a dark area. Replace any light bulbs as soon as they burn out.  Set up your furniture so you have a clear path. Avoid moving your furniture around.  If any of your floors are uneven, fix them.  If there are any pets around you, be aware of where they are.  Review your medicines with your doctor. Some medicines can make you feel dizzy. This can increase your chance of falling. Ask your doctor what other things that you can do to help prevent falls. This information is not intended to replace advice given to you by your health care provider. Make sure you discuss any questions you have with your health care provider. Document Released: 08/20/2009 Document Revised: 03/31/2016 Document Reviewed: 11/28/2014 Elsevier Interactive Patient Education  2017 [employer].

## 2017-08-22 NOTE — Progress Notes (Signed)
Subjective:   Todd [name] is a 76 y.o. male who presents for Medicare Annual/Subsequent preventive examination. 68 Review of Systems:  N/A Cardiac Risk Factors include: advanced age (>62men, >102 women);dyslipidemia;hypertension;male gender;obesity (BMI >30kg/m2)     Objective:    Vitals: BP 122/70   Pulse 68   Temp 98.1 F (36.7 C) (Oral)   Ht 5' 10.55" (1.792 m)   Wt 230 lb 2 oz (104.4 kg)   BMI 32.51 kg/m   Body mass index is 32.51 kg/m.  Tobacco History  Smoking Status  . Former Smoker  . Packs/day: 2.00  . Years: 15.00  . Types: Cigarettes  . Quit date: 01/11/1974  Smokeless Tobacco  . Never Used     Counseling given: Not Answered   Past Medical History:  Diagnosis Date  . Allergy   . Angioedema   . Arthritis   . 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  [facility].  . Bladder stone   . Cancer ([place])    skin cancer multiple; followed by [facility]/dermatology yearly.  . Cataract    4 YEARS - BILATERAL  . Colon polyps     Previous polyps on colonoscopy.  [facility].    . Hiatal hernia 10/20/2013   EGD confirmed.  [facility].  . Hyperlipemia   . Hypertension    no meds  . Neuromuscular disorder ([facility])    numbness rt hand  . Seasonal allergies   . Sleep apnea    uses CPAP nightly   Past Surgical History:  Procedure Laterality Date  . BLADDER STONE REMOVAL  2011, 2018  . CARPAL TUNNEL RELEASE  01/17/2012   Procedure: CARPAL TUNNEL RELEASE;  Surgeon: Cammie Sickle., MD;  Location: [facility];  Service: Orthopedics;  Laterality: Right;  . [place]  . COLONOSCOPY  10/20/2013   [facility].  Polyps.   . CYSTOSCOPY WITH LITHOLAPAXY N/A 01/09/2017   Procedure: CYSTOSCOPY WITH LITHOLAPAXY;  Surgeon: Franchot Gallo, MD;  Location[facility];  Service: Urology;  Laterality: N/A;  . ESOPHAGOGASTRODUODENOSCOPY  10/20/2013   [facility]; biopsy of area concernig for Barrett's; gastritis.  Marland Kitchen EYE SURGERY  2012   both cataracts  . FRACTURE  SURGERY     3 fingers  . HOLMIUM LASER APPLICATION N/A 11/13/5100   Procedure: HOLMIUM LASER APPLICATION;  Surgeon: Franchot Gallo, MD;  Location: [facility];  Service: Urology;  Laterality: N/A;  . QUADRICEPS TENDON REPAIR  2003   bilateral  . ULNAR NERVE TRANSPOSITION  01/17/2012   Procedure: ULNAR NERVE DECOMPRESSION/TRANSPOSITION;  Surgeon: Cammie Sickle., MD;  Location: [place];  Service: Orthopedics;  Laterality: Right;  decompression of ulnar nerve only  at right cubital tunnel  . ULNAR NERVE TRANSPOSITION Left 06/01/2017   Procedure: DECOMPRESSION ULNAR NERVE LEFT CUBITAL TUNNEL;  Surgeon: Leanora Cover, MD;  Location: [place];  Service: Orthopedics;  Laterality: Left;  . UPPER GASTROINTESTINAL ENDOSCOPY    . VASECTOMY     Family History  Problem Relation Age of Onset  . Cirrhosis Father   . Hypertension Father   . Alcohol abuse Father   . Heart failure Mother        in 50s  . Arthritis Brother        Rheumatoid arthritis  . Hypertension Brother    History  Sexual Activity  . Sexual activity: No    Outpatient Encounter Prescriptions as of 08/22/2017  Medication Sig  .  atorvastatin (LIPITOR) 20 MG tablet Take 0.5 tablets (10 mg total) by mouth daily.  . cetirizine (ZYRTEC) 10 MG tablet Take 1 tablet (10 mg total) by mouth at bedtime. (Patient taking differently: Take 10 mg by mouth daily. )  . Digestive Enzymes (PAPAYA AND ENZYMES PO) Take by mouth.  . fish oil-omega-3 fatty acids 1000 MG capsule daily. 2000 mg BID  . fluticasone (FLONASE) 50 MCG/ACT nasal spray Place 2 sprays into both nostrils daily. AS NEEDED  . GARLIC PO Take 009 mg by mouth 2 (two) times daily.   Marland Kitchen HYDROcodone-acetaminophen (NORCO) 5-325 MG tablet 1-2 tabs po q6 hours prn pain  . losartan (COZAAR) 50 MG tablet Take 1 tablet (50 mg total) by mouth daily.  . Misc Natural Products (GLUCOSAMINE CHOND COMPLEX/MSM PO) Take by mouth.  . Multiple  Vitamin (MULTIVITAMIN) tablet Take 1 tablet by mouth daily.  . saw palmetto 160 MG capsule Take 160 mg by mouth 2 (two) times daily.   No facility-administered encounter medications on file as of 08/22/2017.     Activities of Daily Living In your present state of health, do you have any difficulty performing the following activities: 08/22/2017 08/04/2017  Hearing? Y Y  Comment Patient wears hearing aid -  Vision? N N  Difficulty concentrating or making decisions? N N  Walking or climbing stairs? N N  Dressing or bathing? N N  Doing errands, shopping? N N  Preparing Food and eating ? N -  Using the Toilet? N -  In the past six months, have you accidently leaked urine? N -  Do you have problems with loss of bowel control? N -  Managing your Medications? N -  Managing your Finances? N -  Housekeeping or managing your Housekeeping? N -  Some recent data might be hidden    Patient Care Team: Wardell Honour, MD as PCP - General (Family Medicine) Marygrace Drought, MD as Consulting Physician (Ophthalmology) Franchot Gallo, MD as Consulting Physician (Urology)   Assessment:     Exercise Activities and Dietary recommendations Current Exercise Habits: Structured exercise class, Time (Minutes): 60, Frequency (Times/Week): 7, Weekly Exercise (Minutes/Week): 420, Intensity: Moderate, Exercise limited by: None identified  Goals    . Weight (lb) < 200 lb (90.7 kg)          Patient wants to try to lose weight and get to 200 lbs in the near future.       Fall Risk Fall Risk  08/22/2017 08/04/2017 04/12/2017 02/21/2017 01/26/2017  Falls in the past year? No No No No No  Number falls in past yr: - - - - -  Injury with Fall? - - - - -  Comment - - - - -   Depression Screen PHQ 2/9 Scores 08/22/2017 08/04/2017 04/12/2017 02/21/2017  PHQ - 2 Score 0 0 0 0    Cognitive Function     6CIT Screen 08/22/2017  What Year? 0 points  What month? 0 points  What time? 0 points  Count back from  20 0 points  Months in reverse 0 points  Repeat phrase 2 points  Total Score 2    Immunization History  Administered Date(s) Administered  . Influenza,inj,Quad PF,6+ Mos 07/15/2013, 07/21/2014, 07/07/2015, 07/06/2016, 08/04/2017  . Pneumococcal Conjugate-13 08/11/2014  . Pneumococcal Polysaccharide-23 03/10/2008, 08/23/2016  . Tdap 06/14/2010, 05/23/2014  . Zoster 11/07/2010   Screening Tests Health Maintenance  Topic Date Due  . COLONOSCOPY  10/21/2023  . TETANUS/TDAP  05/23/2024  . INFLUENZA  VACCINE  Completed  . PNA vac Low Risk Adult  Completed      Plan:   I have personally reviewed and noted the following in the patient's chart:   . Medical and social history . Use of alcohol, tobacco or illicit drugs  . Current medications and supplements . Functional ability and status . Nutritional status . Physical activity . Advanced directives . List of other physicians . Hospitalizations, surgeries, and ER visits in previous 12 months . Vitals . Screenings to include cognitive, depression, and falls . Referrals and appointments  In addition, I have reviewed and discussed with patient certain preventive protocols, quality metrics, and best practice recommendations. A written personalized care plan for preventive services as well as general preventive health recommendations were provided to patient.     Andrez Grime, LPN  24/81/8590

## 2017-08-22 NOTE — Telephone Encounter (Signed)
Patient states that he called last week about having dizziness with his losartan and never received a call back. He states that he started taking just 1/2 tablet of his losartan daily and the dizziness has improved but his blood pressure has been running high. The highest reading was on 08/21/17 and it was 140/85. Patient would like to know what to do from here.

## 2017-08-24 NOTE — Telephone Encounter (Signed)
Tried to call pt today about B/P medication but did not get an answer. Seen in a recent note  that he has seen by our wellness nurse on 08/22/2017 and she made a note stating he has stopped taking a whole tablet of Losartan and has been taking a 1/2 tab. He states his dizziness is better but his B/P is high reading 140/ 80. Please advise

## 2017-08-25 MED ORDER — HYDROCHLOROTHIAZIDE 12.5 MG PO TABS: 12.5000 mg | ORAL_TABLET | Freq: Every day | ORAL | 1 refills | Status: DC

## 2017-08-25 NOTE — Telephone Encounter (Signed)
FYI

## 2017-08-25 NOTE — Telephone Encounter (Signed)
Spoke with pt about B/P and muscles in the legs and he said he was fine.

## 2017-08-25 NOTE — Telephone Encounter (Signed)
Call --- how are his muscles in his legs feeling on the lower dose of Losartan?

## 2017-08-25 NOTE — Telephone Encounter (Signed)
I sent in HCTZ 12.5mg  one tablet daily for patient to start taking in addition to Losartan 1/2 tablet for blood pressure.

## 2017-08-25 NOTE — Telephone Encounter (Signed)
Spoke with pt about B/P medication and he states that is blood pressure is doing ok. He states he did not want to start new medication because he wanted to talk to Dr. Tamala Julian on Monday at his appointment.

## 2017-08-28 ENCOUNTER — Ambulatory Visit (INDEPENDENT_AMBULATORY_CARE_PROVIDER_SITE_OTHER): Payer: Medicare Other | Admitting: Family Medicine

## 2017-08-28 ENCOUNTER — Encounter: Payer: Self-pay | Admitting: Family Medicine

## 2017-08-28 VITALS — BP 132/72 | HR 83 | Temp 98.0°F | Resp 16 | Ht 72.84 in | Wt 233.0 lb

## 2017-08-28 DIAGNOSIS — Z9989 Dependence on other enabling machines and devices: Secondary | ICD-10-CM | POA: Diagnosis not present

## 2017-08-28 DIAGNOSIS — E7800 Pure hypercholesterolemia, unspecified: Secondary | ICD-10-CM

## 2017-08-28 DIAGNOSIS — M19042 Primary osteoarthritis, left hand: Secondary | ICD-10-CM | POA: Diagnosis not present

## 2017-08-28 DIAGNOSIS — I10 Essential (primary) hypertension: Secondary | ICD-10-CM

## 2017-08-28 DIAGNOSIS — J301 Allergic rhinitis due to pollen: Secondary | ICD-10-CM | POA: Diagnosis not present

## 2017-08-28 DIAGNOSIS — R42 Dizziness and giddiness: Secondary | ICD-10-CM | POA: Diagnosis not present

## 2017-08-28 DIAGNOSIS — G4733 Obstructive sleep apnea (adult) (pediatric): Secondary | ICD-10-CM | POA: Diagnosis not present

## 2017-08-28 DIAGNOSIS — M19041 Primary osteoarthritis, right hand: Secondary | ICD-10-CM | POA: Diagnosis not present

## 2017-08-28 DIAGNOSIS — N210 Calculus in bladder: Secondary | ICD-10-CM

## 2017-08-28 DIAGNOSIS — H903 Sensorineural hearing loss, bilateral: Secondary | ICD-10-CM | POA: Diagnosis not present

## 2017-08-28 MED ORDER — LOSARTAN POTASSIUM 25 MG PO TABS: 25.0000 mg | ORAL_TABLET | Freq: Every day | ORAL | 3 refills | Status: DC

## 2017-08-28 NOTE — Patient Instructions (Signed)
     IF you received an x-ray today, you will receive an invoice from [facility]. Please contact [facility] at [phone] with questions or concerns regarding your invoice.   IF you received labwork today, you will receive an invoice from [facility]. Please contact [facility] at [phone] with questions or concerns regarding your invoice.   Our billing staff will not be able to assist you with questions regarding bills from these companies.  You will be contacted with the lab results as soon as they are available. The fastest way to get your results is to activate your My Chart account. Instructions are located on the last page of this paperwork. If you have not heard from us regarding the results in 2 weeks, please contact this office.     

## 2017-08-28 NOTE — Progress Notes (Signed)
Subjective:    Patient ID: Todd [name], male    DOB: [birth year], 76 y.o.   MRN: [id number]  08/28/2017  Hypertension (follow-up)    HPI This 76 y.o. male presents for six month follow-up of hypertension and hypercholesterolemia.  -new onset tick bites with joint pains; obtain Lyme's disease titers per patient request. -blood pressure well controlled; no change in medications. -obtain labs; refills of Atorvastatin provided. -new dx of OSA on CPAP; doing well. -s/p recent evaluation for hearing loss; has hearing aides and doing well. -suffered recent bladder stone; has been evaluated by urology.  Dizziness when bending over at times.   Numbness in B ankles but not bothersome. 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  Numbness in L fifth digit; no intervention. Saw Dr.  Juan Quam since last visit; PSA 0.53.  No change in medications. Previous Barrett's disease.  Scheduled in 2019 for repeat EGD; last EGD in 2014. Will be due for repeat colonoscopy next year. 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    BP Readings from Last 3 Encounters:  08/28/17 132/72  08/22/17 122/70  08/04/17 120/80   Wt Readings from Last 3 Encounters:  08/28/17 233 lb (105.7 kg)  08/22/17 230 lb 2 oz (104.4 kg)  08/04/17 232 lb 3.2 oz (105.3 kg)   Immunization History  Administered Date(s) Administered  . Influenza,inj,Quad PF,6+ Mos 07/15/2013, 07/21/2014, 07/07/2015, 07/06/2016, 08/04/2017  . Pneumococcal Conjugate-13 08/11/2014  . Pneumococcal Polysaccharide-23 03/10/2008, 08/23/2016  . Tdap 06/14/2010, 05/23/2014  . Zoster 11/07/2010    Review of Systems  Constitutional: Negative for activity change, appetite change, chills, diaphoresis, fatigue and fever.  Respiratory: Negative for cough and shortness of breath.   Cardiovascular: Negative for chest pain, palpitations and leg swelling.  Gastrointestinal: Negative for abdominal pain, diarrhea, nausea and vomiting.    Endocrine: Negative for cold intolerance, heat intolerance, polydipsia, polyphagia and polyuria.  Musculoskeletal: Positive for arthralgias.  Skin: Negative for color change, rash and wound.  Neurological: Positive for dizziness. Negative for tremors, seizures, syncope, facial asymmetry, speech difficulty, weakness, light-headedness, numbness and headaches.  Psychiatric/Behavioral: Negative for dysphoric mood and sleep disturbance. The patient is not nervous/anxious.     Past Medical History:  Diagnosis Date  . Allergy   . Angioedema   . Arthritis   . 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  [facility].  . Bladder stone   . Cancer (HCC)    skin cancer multiple; followed by [facility]matology yearly.  . Cataract    4 YEARS - BILATERAL  . Colon polyps     Previous polyps on colonoscopy.  [facility].    . Hiatal hernia 10/20/2013   EGD confirmed.  [facility].  . Hyperlipemia   . Hypertension    no meds  . Neuromuscular disorder (HCC)    numbness rt hand  . Seasonal allergies   . Sleep apnea    uses CPAP nightly   Past Surgical History:  Procedure Laterality Date  . BLADDER STONE REMOVAL  2011, 2018  . CARPAL TUNNEL RELEASE  01/17/2012   Procedure: CARPAL TUNNEL RELEASE;  Surgeon: [name]., MD;  Location: [facility];  Service: Orthopedics;  Laterality: Right;  . CERVICAL FUSION  1976  . COLONOSCOPY  10/20/2013   [facility].  Polyps.   . CYSTOSCOPY WITH LITHOLAPAXY N/A 01/09/2017   Procedure: CYSTOSCOPY WITH LITHOLAPAXY;  Surgeon: Marcine Matar, MD;  Location: [facility];  Service: Urology;  Laterality:  N/A;  . ESOPHAGOGASTRODUODENOSCOPY  10/20/2013   [facility]; biopsy of area concernig for Barrett's; gastritis.  Marland Kitchen EYE SURGERY  2012   both cataracts  . FRACTURE SURGERY     3 fingers  . HOLMIUM LASER APPLICATION N/A 01/09/2017   Procedure: HOLMIUM LASER APPLICATION;  Surgeon: Marcine Matar, MD;  Location: [facility];  Service: Urology;   Laterality: N/A;  . QUADRICEPS TENDON REPAIR  2003   bilateral  . ULNAR NERVE TRANSPOSITION  01/17/2012   Procedure: ULNAR NERVE DECOMPRESSION/TRANSPOSITION;  Surgeon: [name]., MD;  Location: [facility];  Service: Orthopedics;  Laterality: Right;  decompression of ulnar nerve only  at right cubital tunnel  . ULNAR NERVE TRANSPOSITION Left 06/01/2017   Procedure: DECOMPRESSION ULNAR NERVE LEFT CUBITAL TUNNEL;  Surgeon: Betha Loa, MD;  Location: [facility];  Service: Orthopedics;  Laterality: Left;  . UPPER GASTROINTESTINAL ENDOSCOPY    . VASECTOMY     Allergies  Allergen Reactions  . Ace Inhibitors Cough  . Amoxicillin Nausea And Vomiting  . Darvon     Chest pain  . Toprol Xl [Metoprolol Tartrate]    Current Outpatient Prescriptions on File Prior to Visit  Medication Sig Dispense Refill  . atorvastatin (LIPITOR) 20 MG tablet Take 0.5 tablets (10 mg total) by mouth daily. 90 tablet 3  . cetirizine (ZYRTEC) 10 MG tablet Take 1 tablet (10 mg total) by mouth at bedtime. (Patient taking differently: Take 10 mg by mouth daily. ) 90 tablet 3  . Digestive Enzymes (PAPAYA AND ENZYMES PO) Take by mouth.    . fish oil-omega-3 fatty acids 1000 MG capsule daily. 2000 mg BID    . fluticasone (FLONASE) 50 MCG/ACT nasal spray Place 2 sprays into both nostrils daily. AS NEEDED 16 g 11  . GARLIC PO Take 161 mg by mouth 2 (two) times daily.     Marland Kitchen HYDROcodone-acetaminophen (NORCO) 5-325 MG tablet 1-2 tabs po q6 hours prn pain 20 tablet 0  . Misc Natural Products (GLUCOSAMINE CHOND COMPLEX/MSM PO) Take by mouth.    . Multiple Vitamin (MULTIVITAMIN) tablet Take 1 tablet by mouth daily.    . saw palmetto 160 MG capsule Take 160 mg by mouth 2 (two) times daily.    . hydrochlorothiazide (HYDRODIURIL) 12.5 MG tablet Take 1 tablet (12.5 mg total) by mouth daily. (Patient not taking: Reported on 08/28/2017) 30 tablet 1   No current facility-administered medications on  file prior to visit.    Social History   Social History  . Marital status: Married    Spouse name: N/A  . Number of children: 2  . Years of education: 12   Occupational History  . Retired    Social History Main Topics  . Smoking status: Former Smoker    Packs/day: 2.00    Years: 15.00    Types: Cigarettes    Quit date: 01/11/1974  . Smokeless tobacco: Never Used  . Alcohol use 0.5 oz/week    1 Standard drinks or equivalent per week     Comment: occ  . Drug use: No  . Sexual activity: No   Other Topics Concern  . Not on file   Social History Narrative   Marital status:  Married x 42 years      Children: 2 daughters; 3 grandchildren.      Employment; retired in 1998 from [employer]; retired age 68 from [employer] after 14 years.      Lives: with  wife.      Tobacco: former smoker; quit at age 58.  Smoked 15 years.      Alcohol:  On vacation or special occasions.        Education: [employer].       Exercise: Gym 3-4 times a week for 2 hours.; exercises 7 days per week.   Elliptical for one hour.      Advanced Directives: none; +desires FULL CODE.        ADLs: independent with ADLs; drives.  Does not walk with assistant devices.     Drinks 3-4 caffeine drinks a day    Family History  Problem Relation Age of Onset  . Cirrhosis Father   . Hypertension Father   . Alcohol abuse Father   . Heart failure Mother        in 39s  . Arthritis Brother        Rheumatoid arthritis  . Hypertension Brother        Objective:    BP 132/72   Pulse 83   Temp 98 F (36.7 C) (Oral)   Resp 16   Ht 6' 0.83" (1.85 m)   Wt 233 lb (105.7 kg)   SpO2 97%   BMI 30.88 kg/m  Physical Exam  Constitutional: He is oriented to person, place, and time. He appears well-developed and well-nourished. No distress.  HENT:  Head: Normocephalic and atraumatic.  Right Ear: External ear normal.  Left Ear: External ear normal.  Nose: Nose normal.  Mouth/Throat: Oropharynx is  clear and moist.  Eyes: Pupils are equal, round, and reactive to light. Conjunctivae and EOM are normal.  Neck: Normal range of motion. Neck supple. Carotid bruit is not present. No thyromegaly present.  Cardiovascular: Normal rate, regular rhythm, normal heart sounds and intact distal pulses.  Exam reveals no gallop and no friction rub.   No murmur heard. Pulmonary/Chest: Effort normal and breath sounds normal. He has no wheezes. He has no rales.  Abdominal: Soft. Bowel sounds are normal. He exhibits no distension and no mass. There is no tenderness. There is no rebound and no guarding.  Lymphadenopathy:    He has no cervical adenopathy.  Neurological: He is alert and oriented to person, place, and time. No cranial nerve deficit. He exhibits normal muscle tone. Coordination normal.  Skin: Skin is warm and dry. No rash noted. He is not diaphoretic.  Psychiatric: He has a normal mood and affect. His behavior is normal.  Nursing note and vitals reviewed.  No results found. Depression screen [facility] 2/9 08/28/2017 08/22/2017 08/04/2017 04/12/2017 02/21/2017  Decreased Interest 0 0 0 0 0  Down, Depressed, Hopeless 0 0 0 0 0  PHQ - 2 Score 0 0 0 0 0   Fall Risk  08/28/2017 08/22/2017 08/04/2017 04/12/2017 02/21/2017  Falls in the past year? No No No No No  Number falls in past yr: - - - - -  Injury with Fall? - - - - -  Comment - - - - -        Assessment & Plan:   1. Dizziness   2. Essential hypertension, benign   3. OSA on CPAP   4. Seasonal allergic rhinitis due to pollen   5. Sensorineural hearing loss (SNHL) of both ears   6. Primary osteoarthritis of both hands   7. Pure hypercholesterolemia   8. Bladder stone     -controlled blood pressure with lower dose of Losartan; suffering with dizziness on Losartan 50mg ; rx  for Losartan 25mg  daily; obtain labs for chronic disease management. -dizziness not suggestive of vertigo; likely due to orthostatic hypotension.  -s/p surgical procedure  for entrapment of LEFT ulnar nerve by Merlyn Lot this summer.   Orders Placed This Encounter  Procedures  . Comprehensive metabolic panel  . Lipid panel  . CBC with Differential/Platelet   Meds ordered this encounter  Medications  . losartan (COZAAR) 25 MG tablet    Sig: Take 1 tablet (25 mg total) by mouth daily.    Dispense:  90 tablet    Refill:  3    Return in about 6 months (around 02/26/2018) for complete physical examiniation.   Totiana Everson Paulita Fujita, M.D. Primary Care at [facility] previously [facility] [address] [place], [place]  [address] [phone] phone [phone] fax

## 2017-08-29 ENCOUNTER — Encounter: Payer: Medicare Other | Admitting: Family Medicine

## 2017-08-29 LAB — CBC WITH DIFFERENTIAL/PLATELET
BASOS: 0 %
Basophils Absolute: 0 10*3/uL (ref 0.0–0.2)
EOS (ABSOLUTE): 0.1 10*3/uL (ref 0.0–0.4)
EOS: 3 %
HEMATOCRIT: 44.6 % (ref 37.5–51.0)
Hemoglobin: 14.9 g/dL (ref 13.0–17.7)
Immature Grans (Abs): 0 10*3/uL (ref 0.0–0.1)
Immature Granulocytes: 0 %
LYMPHS ABS: 1.1 10*3/uL (ref 0.7–3.1)
LYMPHS: 21 %
MCH: 29.3 pg (ref 26.6–33.0)
MCHC: 33.4 g/dL (ref 31.5–35.7)
MCV: 88 fL (ref 79–97)
MONOCYTES: 9 %
MONOS ABS: 0.4 10*3/uL (ref 0.1–0.9)
NEUTROS ABS: 3.3 10*3/uL (ref 1.4–7.0)
Neutrophils: 67 %
PLATELETS: 233 10*3/uL (ref 150–379)
RBC: 5.08 x10E6/uL (ref 4.14–5.80)
RDW: 14.1 % (ref 12.3–15.4)
WBC: 4.9 10*3/uL (ref 3.4–10.8)

## 2017-08-29 LAB — COMPREHENSIVE METABOLIC PANEL
ALK PHOS: 87 IU/L (ref 39–117)
ALT: 35 IU/L (ref 0–44)
AST: 27 IU/L (ref 0–40)
Albumin/Globulin Ratio: 2 (ref 1.2–2.2)
Albumin: 4.7 g/dL (ref 3.5–4.8)
BILIRUBIN TOTAL: 0.7 mg/dL (ref 0.0–1.2)
BUN/Creatinine Ratio: 13 (ref 10–24)
BUN: 12 mg/dL (ref 8–27)
CALCIUM: 9.3 mg/dL (ref 8.6–10.2)
CHLORIDE: 97 mmol/L (ref 96–106)
CO2: 24 mmol/L (ref 20–29)
CREATININE: 0.94 mg/dL (ref 0.76–1.27)
GFR calc Af Amer: 92 mL/min/{1.73_m2} (ref >59)
GFR calc non Af Amer: 80 mL/min/{1.73_m2} (ref >59)
GLOBULIN, TOTAL: 2.4 g/dL (ref 1.5–4.5)
Glucose: 113 mg/dL — ABNORMAL HIGH (ref 65–99)
POTASSIUM: 4.3 mmol/L (ref 3.5–5.2)
SODIUM: 138 mmol/L (ref 134–144)
TOTAL PROTEIN: 7.1 g/dL (ref 6.0–8.5)

## 2017-08-29 LAB — LIPID PANEL
CHOLESTEROL TOTAL: 143 mg/dL (ref 100–199)
Chol/HDL Ratio: 3.4 ratio (ref 0.0–5.0)
HDL: 42 mg/dL (ref >39)
LDL Calculated: 75 mg/dL (ref 0–99)
Triglycerides: 131 mg/dL (ref 0–149)
VLDL Cholesterol Cal: 26 mg/dL (ref 5–40)

## 2017-10-08 ENCOUNTER — Encounter: Payer: Self-pay | Admitting: Neurology

## 2017-10-09 ENCOUNTER — Encounter: Payer: Self-pay | Admitting: Adult Health

## 2017-10-09 ENCOUNTER — Ambulatory Visit (INDEPENDENT_AMBULATORY_CARE_PROVIDER_SITE_OTHER): Payer: Medicare Other | Admitting: Adult Health

## 2017-10-09 VITALS — BP 126/70 | HR 78 | Ht 72.0 in | Wt 232.2 lb

## 2017-10-09 DIAGNOSIS — G4733 Obstructive sleep apnea (adult) (pediatric): Secondary | ICD-10-CM

## 2017-10-09 DIAGNOSIS — Z9989 Dependence on other enabling machines and devices: Secondary | ICD-10-CM

## 2017-10-09 NOTE — Patient Instructions (Signed)
Your Plan:  You will come back in for a sleep titration  If your symptoms worsen or you develop new symptoms please let us know.    Thank you for coming to see [place] at [facility]. I hope we have been able to provide you high quality care today.  You may receive a patient satisfaction survey over the next few weeks. We would appreciate your feedback and comments so that we may continue to improve ourselves and the health of our patients.

## 2017-10-09 NOTE — Progress Notes (Addendum)
PATIENT: Todd [name] DOB: May 10, 1941  REASON FOR VISIT: follow up HISTORY FROM: patient  HISTORY OF PRESENT ILLNESS: Today 10/09/17 Todd [name] is a 76 year old male with a history of obstructive sleep apnea on CPAP.  He returns today for follow-up.  His download indicates that he used his machine 30 out of 30 days for compliance of 100%.  He uses machine greater than 4 hours each night.  On average he uses his machine 7 hours and 49 minutes.  His residual AHI is elevated at 27.9.  He is on a pressure of 15 cm of water with EPR of 3.  He does not have a significant leak.  He reports that he still feels sleepy throughout the day however he reports that he is no longer snoring.  He returns today for an evaluation.  HISTORY 04/11/17 [facility]): I reviewed his CPAP compliance data from 03/11/2017 through 04/09/2017 which is a total of 30 days, during which time he used his machine 100%, average usage of 7 hours and 38 minutes, residual AHI improved but still elevated at 21.7 per hour, Mostly obstructive in nature, leak acceptable with the 95th percentile at 14.2 L/m on a pressure of 13 cm with EPR of 3. He reports doing well, tolerated the increase in pressure. Has had some numbness in the pinky fingers b/l, has a Hx of degenerative cervical spine d/s, s/p surgery in 78. Some R knee discomfort.  Using a Lg FFM, some leak, but overall okay, still feels he has done quite well with using CPAP in the sense that he feels improved, daytime energy better, headaches improved.  The patient's allergies, current medications, family history, past medical history, past social history, past surgical history and problem list were reviewed and updated as appropriate  REVIEW OF SYSTEMS: Out of a complete 14 system review of symptoms, the patient complains only of the following symptoms, and all other reviewed systems are negative.  Ringing in ears, joint pain, neck stiffness, snoring, dizziness  ALLERGIES: Allergies    Allergen Reactions  . Ace Inhibitors Cough  . Amoxicillin Nausea And Vomiting  . Darvon     Chest pain  . Toprol Xl [Metoprolol Tartrate]     HOME MEDICATIONS: Outpatient Medications Prior to Visit  Medication Sig Dispense Refill  . atorvastatin (LIPITOR) 20 MG tablet Take 0.5 tablets (10 mg total) by mouth daily. 90 tablet 3  . cetirizine (ZYRTEC) 10 MG tablet Take 1 tablet (10 mg total) by mouth at bedtime. (Patient taking differently: Take 10 mg by mouth daily. ) 90 tablet 3  . Digestive Enzymes (PAPAYA AND ENZYMES PO) Take by mouth.    . fish oil-omega-3 fatty acids 1000 MG capsule daily. 2000 mg BID    . fluticasone (FLONASE) 50 MCG/ACT nasal spray Place 2 sprays into both nostrils daily. AS NEEDED 16 g 11  . GARLIC PO Take 062 mg by mouth 2 (two) times daily.     Marland Kitchen losartan (COZAAR) 25 MG tablet Take 1 tablet (25 mg total) by mouth daily. 90 tablet 3  . Misc Natural Products (GLUCOSAMINE CHOND COMPLEX/MSM PO) Take by mouth.    . Multiple Vitamin (MULTIVITAMIN) tablet Take 1 tablet by mouth daily.    . saw palmetto 160 MG capsule Take 160 mg by mouth 2 (two) times daily.    . hydrochlorothiazide (HYDRODIURIL) 12.5 MG tablet Take 1 tablet (12.5 mg total) by mouth daily. (Patient not taking: Reported on 08/28/2017) 30 tablet 1  . HYDROcodone-acetaminophen ([place])  5-325 MG tablet 1-2 tabs po q6 hours prn pain 20 tablet 0   No facility-administered medications prior to visit.     PAST MEDICAL HISTORY: Past Medical History:  Diagnosis Date  . Allergy   . Angioedema   . Arthritis   . 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  [facility].  . Bladder stone   . Cancer ([place])    skin cancer multiple; followed by [facility]/dermatology yearly.  . Cataract    4 YEARS - BILATERAL  . Colon polyps     Previous polyps on colonoscopy.  [facility].    . Hiatal hernia 10/20/2013   EGD confirmed.  [facility].  . Hyperlipemia   . Hypertension    no meds  . Neuromuscular disorder (HCC)    numbness rt  hand  . Seasonal allergies   . Sleep apnea    uses CPAP nightly    PAST SURGICAL HISTORY: Past Surgical History:  Procedure Laterality Date  . BLADDER STONE REMOVAL  2011, 2018  . CARPAL TUNNEL RELEASE  01/17/2012   Procedure: CARPAL TUNNEL RELEASE;  Surgeon: Cammie Sickle., MD;  Location: [place];  Service: Orthopedics;  Laterality: Right;  . [place]  . COLONOSCOPY  10/20/2013   [facility].  Polyps.   . CYSTOSCOPY WITH LITHOLAPAXY N/A 01/09/2017   Procedure: CYSTOSCOPY WITH LITHOLAPAXY;  Surgeon: Franchot Gallo, MD;  Location: [facility];  Service: Urology;  Laterality: N/A;  . ESOPHAGOGASTRODUODENOSCOPY  10/20/2013   [facility]; biopsy of area concernig for Barrett's; gastritis.  Marland Kitchen EYE SURGERY  2012   both cataracts  . FRACTURE SURGERY     3 fingers  . HOLMIUM LASER APPLICATION N/A 06/13/5783   Procedure: HOLMIUM LASER APPLICATION;  Surgeon: Franchot Gallo, MD;  Location: [facility];  Service: Urology;  Laterality: N/A;  . QUADRICEPS TENDON REPAIR  2003   bilateral  . ULNAR NERVE TRANSPOSITION  01/17/2012   Procedure: ULNAR NERVE DECOMPRESSION/TRANSPOSITION;  Surgeon: Cammie Sickle., MD;  Location: [place];  Service: Orthopedics;  Laterality: Right;  decompression of ulnar nerve only  at right cubital tunnel  . ULNAR NERVE TRANSPOSITION Left 06/01/2017   Procedure: DECOMPRESSION ULNAR NERVE LEFT CUBITAL TUNNEL;  Surgeon: Leanora Cover, MD;  Location: [facility];  Service: Orthopedics;  Laterality: Left;  . UPPER GASTROINTESTINAL ENDOSCOPY    . VASECTOMY      FAMILY HISTORY: Family History  Problem Relation Age of Onset  . Cirrhosis Father   . Hypertension Father   . Alcohol abuse Father   . Heart failure Mother        in 81s  . Arthritis Brother        Rheumatoid arthritis  . Hypertension Brother     SOCIAL HISTORY: Social History   Socioeconomic History  . Marital status:  Married    Spouse name: Not on file  . Number of children: 2  . Years of education: 54  . Highest education level: Not on file  Social Needs  . Financial resource strain: Not on file  . Food insecurity - worry: Not on file  . Food insecurity - inability: Not on file  . Transportation needs - medical: Not on file  . Transportation needs - non-medical: Not on file  Occupational History  . Occupation: Retired  Tobacco Use  . Smoking status: Former Smoker    Packs/day: 2.00    Years: 15.00    Pack  years: 30.00    Types: Cigarettes    Last attempt to quit: 01/11/1974    Years since quitting: 43.7  . Smokeless tobacco: Never Used  Substance and Sexual Activity  . Alcohol use: Yes    Alcohol/week: 0.5 oz    Types: 1 Standard drinks or equivalent per week    Comment: occ  . Drug use: No  . Sexual activity: No  Other Topics Concern  . Not on file  Social History Narrative   Marital status:  Married x 42 years      Children: 2 daughters; 3 grandchildren.      Employment; retired in 1998 from [employer]; retired age 4 from [employer] after 14 years.      Lives: with wife.      Tobacco: former smoker; quit at age 48.  Smoked 15 years.      Alcohol:  On vacation or special occasions.        Education: [employer].       Exercise: Gym 3-4 times a week for 2 hours.; exercises 7 days per week.   Elliptical for one hour.      Advanced Directives: none; +desires FULL CODE.        ADLs: independent with ADLs; drives.  Does not walk with assistant devices.     Drinks 3-4 caffeine drinks a day       PHYSICAL EXAM  Vitals:   10/09/17 0810  BP: 126/70  Pulse: 78  Weight: 232 lb 3.2 oz (105.3 kg)  Height: 6' (1.829 m)   Body mass index is 31.49 kg/m.  Generalized: Well developed, in no acute distress   Neurological examination  Mentation: Alert oriented to time, place, history taking. Follows all commands speech and language fluent Cranial nerve II-XII: Pupils  were equal round reactive to light. Extraocular movements were full, visual field were full on confrontational test. Facial sensation and strength were normal. Uvula tongue midline. Head turning and shoulder shrug  were normal and symmetric. Motor: The motor testing reveals 5 over 5 strength of all 4 extremities. Good symmetric motor tone is noted throughout.  Sensory: Sensory testing is intact to soft touch on all 4 extremities. No evidence of extinction is noted.  Gait and station: Gait is normal.  Reflexes: Deep tendon reflexes are symmetric and normal bilaterally.   DIAGNOSTIC DATA (LABS, IMAGING, TESTING) - I reviewed patient records, labs, notes, testing and imaging myself where available.  Lab Results  Component Value Date   WBC 4.9 08/28/2017   HGB 14.9 08/28/2017   HCT 44.6 08/28/2017   MCV 88 08/28/2017   PLT 233 08/28/2017      Component Value Date/Time   NA 138 08/28/2017 1057   K 4.3 08/28/2017 1057   CL 97 08/28/2017 1057   CO2 24 08/28/2017 1057   GLUCOSE 113 (H) 08/28/2017 1057   GLUCOSE 124 (H) 01/09/2017 0653   BUN 12 08/28/2017 1057   CREATININE 0.94 08/28/2017 1057   CREATININE 0.89 08/23/2016 1016   CALCIUM 9.3 08/28/2017 1057   PROT 7.1 08/28/2017 1057   ALBUMIN 4.7 08/28/2017 1057   AST 27 08/28/2017 1057   ALT 35 08/28/2017 1057   ALKPHOS 87 08/28/2017 1057   BILITOT 0.7 08/28/2017 1057   GFRNONAA 80 08/28/2017 1057   GFRAA 92 08/28/2017 1057   Lab Results  Component Value Date   CHOL 143 08/28/2017   HDL 42 08/28/2017   LDLCALC 75 08/28/2017   TRIG 131 08/28/2017  CHOLHDL 3.4 08/28/2017   Lab Results  Component Value Date   HGBA1C 6.0 (H) 03/12/2013   No results found for: [id number] Lab Results  Component Value Date   TSH 1.755 06/18/2012      ASSESSMENT AND PLAN 76 y.o. year old male  has a past medical history of Allergy, Angioedema, Arthritis, Barrett esophagus (11/07/2008), Bladder stone, Cancer ([place]), Cataract, Colon polyps,  Hiatal hernia (10/20/2013), Hyperlipemia, Hypertension, Neuromuscular disorder ([place]), Seasonal allergies, and Sleep apnea. here with:  1.  Obstructive sleep apnea on CPAP  The patient's residual AHI remains elevated despite increasing his pressure to 15 cm of water.  The patient will come in for a CPAP titration.  He perhaps needs BiPAP therapy in order for his apnea to be adequately treated.  The patient is amenable to this plan.  He will follow-up after his CPAP titration.  I spent 15 minutes with the patient. 50% of this time was spent reviewing his sleep download.    Ward Givens, MSN, NP-C 10/09/2017, 8:21 AM [facility] [address], [place], [place] [address] [phone]  I reviewed the above note and documentation by the Nurse Practitioner and agree with the history, physical exam, assessment and plan as outlined above. I was immediately available for face-to-face consultation. Star Age, MD, PhD [facility]

## 2017-11-09 ENCOUNTER — Other Ambulatory Visit: Payer: Self-pay | Admitting: Family Medicine

## 2017-12-17 ENCOUNTER — Ambulatory Visit (INDEPENDENT_AMBULATORY_CARE_PROVIDER_SITE_OTHER): Payer: Medicare Other | Admitting: Neurology

## 2017-12-17 DIAGNOSIS — G4720 Circadian rhythm sleep disorder, unspecified type: Secondary | ICD-10-CM

## 2017-12-17 DIAGNOSIS — G4733 Obstructive sleep apnea (adult) (pediatric): Secondary | ICD-10-CM | POA: Diagnosis not present

## 2017-12-17 DIAGNOSIS — Z9989 Dependence on other enabling machines and devices: Secondary | ICD-10-CM

## 2017-12-17 DIAGNOSIS — G4761 Periodic limb movement disorder: Secondary | ICD-10-CM

## 2017-12-18 ENCOUNTER — Telehealth: Payer: Self-pay | Admitting: Neurology

## 2017-12-18 DIAGNOSIS — Z9989 Dependence on other enabling machines and devices: Secondary | ICD-10-CM

## 2017-12-18 DIAGNOSIS — G4733 Obstructive sleep apnea (adult) (pediatric): Secondary | ICD-10-CM

## 2017-12-18 NOTE — Telephone Encounter (Signed)
I called pt. I advised pt that Dr. Rexene Alberts reviewed their sleep study results and found that pt did fairly well with bipap during the latest sleep study. Dr. Rexene Alberts recommends that pt start a bipap at home. I reviewed PAP compliance expectations with the pt. Pt is agreeable to starting a BiPAP. I advised pt that an order will be sent to a DME, [facility], and [facility] will call the pt within about one week after they file with the pt's insurance. [facility] will show the pt how to use the machine, fit for masks, and troubleshoot the BiPAP if needed. A follow up appt was made for insurance purposes with Dr. Rexene Alberts on 03/08/18 at 9:30am. Pt verbalized understanding to arrive 15 minutes early and bring their BiPAP. A letter with all of this information in it will be mailed to the pt as a reminder. I verified with the pt that the address we have on file is correct. Pt verbalized understanding of results. Pt had no questions at this time but was encouraged to call back if questions arise.

## 2017-12-18 NOTE — Procedures (Signed)
PATIENT'S NAME:  Todd, [name] DOB:      [birth year]      MR#:    [id number]     DATE OF RECORDING: 12/17/2017 REFERRING M.D.:  Fidela Salisbury, MD Study Performed:   CPAP  Titration HISTORY: 77 year old male with a history of hypertension, history of arthritis, s/p b/l knee surgeries in 2003 secondary to fall down stairs, with tendon tears b/l, skin cancer, cataract with status post repairs, hiatal hernia, hyperlipidemia, degenerative neck d/s with s/p surgery in '76, history of angioedema, and obesity, who returns for a PAP titration to help optimize treatment settings. He has been compliant with CPAP, but residual AHI has been consistently high, despite recent pressure increases. BMI of 31.4 kg/m2.   CURRENT MEDICATIONS: Vitamin C, Lipitor, Zyrtec, Flonase, Garlic, Glucosamine, Cozaar, Multivitamin, Aleve,   PROCEDURE:  This is a multichannel digital polysomnogram utilizing the SomnoStar 11.2 system.  Electrodes and sensors were applied and monitored per AASM Specifications.   EEG, EOG, Chin and Limb EMG, were sampled at 200 Hz.  ECG, Snore and Nasal Pressure, Thermal Airflow, Respiratory Effort, CPAP Flow and Pressure, Oximetry was sampled at 50 Hz. Digital video and audio were recorded.      CPAP was initiated at 6 cmH20 with heated humidity per AASM standards and pressure was advanced to 13 cmH20 because of hypopneas, apneas and desaturations. CPAP treatment was not sufficient and on 13 cm, his AHI was 31.6/hour. He was therefore switched to BiPAP of 14/10 cm and titrated to 17/13 cm, however, he did not achieve very much sleep during this study. At a PAP pressure of 17/13 cmH20, the AHI was 25.7/hour, supine NREM sleep achieved and O2 nadir of 92%. I will recommend a home BiPAP pressure of 18/14 cm.   Lights Out was at 22:33 and Lights On at 05:19. Total recording time (TRT) was 406 minutes, with a total sleep time (TST) of 224.5 minutes. The patient's sleep latency was 24 minutes. REM latency was 139  minutes, which is mildly delayed. The sleep efficiency was 55.3 %, which is markedly reduced.    SLEEP ARCHITECTURE: WASO (Wake after sleep onset)  was 167 minutes with moderate to severe sleep fragmentation noted. There were 38 minutes in Stage N1, 95 minutes Stage N2, 63.5 minutes Stage N3 and 28 minutes in Stage REM.  The percentage of Stage N1 was 16.9%, which is increased, Stage N2 was 42.3%, Stage N3 was 28.3% and Stage R (REM sleep) was 12.5%, which is reduced. The arousals were noted as: 5 were spontaneous, 3 were associated with PLMs, 13 were associated with respiratory events. Audio and video analysis did not show any abnormal or unusual movements, behaviors, phonations or vocalizations. The patient took 4 bathroom breaks. The EKG was in keeping with normal sinus rhythm (NSR).  RESPIRATORY ANALYSIS:  There was a total of 37 respiratory events: 2 obstructive apneas, 1 central apneas and 7 mixed apneas with a total of 10 apneas and an apnea index (AI) of 2.7 /hour. There were 27 hypopneas with a hypopnea index of 7.2/hour. The patient also had 0 respiratory event related arousals (RERAs).      The total APNEA/HYPOPNEA INDEX  (AHI) was 9.9 /hour and the total RESPIRATORY DISTURBANCE INDEX was 9.9 .hour  0 events occurred in REM sleep and 37 events in NREM. The REM AHI was 0 /hour versus a non-REM AHI of 11.3 /hour.  The patient spent 46.5 minutes of total sleep time in the supine position and 178 minutes in  non-supine. The supine AHI was 36.1, versus a non-supine AHI of 3.0.  OXYGEN SATURATION & C02:  The baseline 02 saturation was 94%, with the lowest being 89%. Time spent below 89% saturation equaled 0 minutes.  PERIODIC LIMB MOVEMENTS: The patient had a total of 252 Periodic Limb Movements. The Periodic Limb Movement (PLM) index was 67.3 and the PLM Arousal index was .8 /hour.  Post-study, the patient indicated that sleep was worse than usual.   DIAGNOSIS 1. Obstructive Sleep Apnea   2. Insufficient treatment with CPAP 3. Periodic Limb Movement Disorder  4. Dysfunctions associated with sleep stages or arousal from sleep   PLANS/RECOMMENDATIONS: 1. This study demonstrates improvement, albeit no resolution of the patient's obstructive sleep apnea with BiPAP therapy. He did not do well enough on CPAP. I will start the patient on home BiPAP treatment at a pressure of 18/14 cm via mask of choice with heated humidity. The patient should be reminded to be fully compliant with PAP therapy to improve sleep related symptoms and decrease long term cardiovascular risks. The patient should be reminded, that it may take up to 3 months to get fully used to using PAP with all planned sleep. The earlier full compliance is achieved, the better long term compliance tends to be. Please note that untreated obstructive sleep apnea carries additional perioperative morbidity. Patients with significant obstructive sleep apnea should receive perioperative PAP therapy and the surgeons and particularly the anesthesiologist should be informed of the diagnosis and the severity of the sleep disordered breathing. 2. Severe PLMs (periodic limb movements of sleep) were noted during this study without any significant arousals; clinical correlation is recommended.  3. This study shows sleep fragmentation and abnormal sleep stage percentages; these are nonspecific findings and per se do not signify an intrinsic sleep disorder or a cause for the patient's sleep-related symptoms. Causes include (but are not limited to) the first night effect of the sleep study, circadian rhythm disturbances, medication effect or an underlying mood disorder or medical problem.  4. The patient should be cautioned not to drive, work at heights, or operate dangerous or heavy equipment when tired or sleepy. Review and reiteration of good sleep hygiene measures should be pursued with any patient. 5. The patient will be seen in follow-up by Dr.  Rexene Alberts at [facility] for discussion of the test results and further management strategies. The referring provider will be notified of the test results.  I certify that I have reviewed the entire raw data recording prior to the issuance of this report in accordance with the Standards of Accreditation of the American Academy of Sleep Medicine (AASM)  Star Age, MD, PHD Diplomat, American Board of Psychiatry and Neurology (Neurology and Sleep Medicine)

## 2017-12-18 NOTE — Telephone Encounter (Signed)
I called pt to discuss his sleep study results. No answer, left a message asking him to call me back. 

## 2017-12-18 NOTE — Telephone Encounter (Signed)
Could not make result note.  Patient had a PAP titration study on 12/17/17, due to elevated AHI despite CPAP of 15 cm at home, last saw MM on 10/09/17.   Please call and inform patient that I have entered an order for treatment with positive airway pressure (PAP) treatment for obstructive sleep apnea (OSA). He did fairly well during the latest sleep study with BiPAP. We will, therefore, arrange for a machine for home use through his DME (durable medical equipment) company. He needs a FU in about 10 weeks with me or if needed with MM.  Please also explain to the patient that I will be looking out for compliance data, which can be downloaded from the machine (stored on an SD card, that is inserted in the machine) or via remote access through a modem, that is built into the machine. At the time of the followup appointment we will discuss sleep study results and how it is going with PAP treatment at home. Please advise patient to bring His machine at the time of the first FU visit, even though this is cumbersome. Bringing the machine for every visit after that will likely not be needed, but often helps for the first visit to troubleshoot if needed. Please re-enforce the importance of compliance with treatment and the need for [place] to monitor compliance data - often an insurance requirement and actually good feedback for the patient as far as how they are doing.  Also remind patient, that any interim PAP machine or mask issues should be first addressed with the DME company, as they can often help better with technical and mask fit issues. Please ask if patient has a preference regarding DME company. Thanks.

## 2017-12-18 NOTE — Telephone Encounter (Signed)
Patient is returning your call.  

## 2017-12-19 NOTE — Telephone Encounter (Signed)
Patient says his pressure is too high on his CPAP. He hardly slept last night

## 2017-12-19 NOTE — Telephone Encounter (Signed)
I called pt. He says that he felt like his cpap pressure was too high last night. He says his cpap machine was making a funny sound. I advised him that the pressure was not adjusted on his cpap machine. He needs a bipap machine which has not been started. I advised him to call [facility]; his mask leak was quite high last night and if the machine is making a funny sound, it may need to be checked out. Pt has been tolerating the pressure fine, so I do not think the pressure should need adjusting as of right now. Pt verbalized understanding.

## 2018-02-26 ENCOUNTER — Ambulatory Visit (INDEPENDENT_AMBULATORY_CARE_PROVIDER_SITE_OTHER): Payer: Medicare Other | Admitting: Family Medicine

## 2018-02-26 ENCOUNTER — Encounter: Payer: Self-pay | Admitting: Family Medicine

## 2018-02-26 ENCOUNTER — Other Ambulatory Visit: Payer: Self-pay

## 2018-02-26 VITALS — BP 122/70 | HR 80 | Temp 98.0°F | Resp 16 | Ht 72.84 in | Wt 227.0 lb

## 2018-02-26 DIAGNOSIS — H903 Sensorineural hearing loss, bilateral: Secondary | ICD-10-CM

## 2018-02-26 DIAGNOSIS — M19042 Primary osteoarthritis, left hand: Secondary | ICD-10-CM

## 2018-02-26 DIAGNOSIS — E6609 Other obesity due to excess calories: Secondary | ICD-10-CM

## 2018-02-26 DIAGNOSIS — R739 Hyperglycemia, unspecified: Secondary | ICD-10-CM

## 2018-02-26 DIAGNOSIS — Z6831 Body mass index (BMI) 31.0-31.9, adult: Secondary | ICD-10-CM

## 2018-02-26 DIAGNOSIS — N210 Calculus in bladder: Secondary | ICD-10-CM | POA: Diagnosis not present

## 2018-02-26 DIAGNOSIS — Z9989 Dependence on other enabling machines and devices: Secondary | ICD-10-CM

## 2018-02-26 DIAGNOSIS — E7800 Pure hypercholesterolemia, unspecified: Secondary | ICD-10-CM

## 2018-02-26 DIAGNOSIS — N200 Calculus of kidney: Secondary | ICD-10-CM

## 2018-02-26 DIAGNOSIS — Z0000 Encounter for general adult medical examination without abnormal findings: Secondary | ICD-10-CM

## 2018-02-26 DIAGNOSIS — N401 Enlarged prostate with lower urinary tract symptoms: Secondary | ICD-10-CM

## 2018-02-26 DIAGNOSIS — M19041 Primary osteoarthritis, right hand: Secondary | ICD-10-CM

## 2018-02-26 DIAGNOSIS — R351 Nocturia: Secondary | ICD-10-CM

## 2018-02-26 DIAGNOSIS — G4733 Obstructive sleep apnea (adult) (pediatric): Secondary | ICD-10-CM

## 2018-02-26 DIAGNOSIS — J301 Allergic rhinitis due to pollen: Secondary | ICD-10-CM

## 2018-02-26 DIAGNOSIS — I10 Essential (primary) hypertension: Secondary | ICD-10-CM

## 2018-02-26 LAB — POCT URINALYSIS DIP (MANUAL ENTRY)
Bilirubin, UA: NEGATIVE
Blood, UA: NEGATIVE
Glucose, UA: NEGATIVE mg/dL
Ketones, POC UA: NEGATIVE mg/dL
LEUKOCYTES UA: NEGATIVE
Nitrite, UA: NEGATIVE
Protein Ur, POC: NEGATIVE mg/dL
Spec Grav, UA: 1.02 (ref 1.010–1.025)
UROBILINOGEN UA: 0.2 U/dL
pH, UA: 7 (ref 5.0–8.0)

## 2018-02-26 MED ORDER — ATORVASTATIN CALCIUM 20 MG PO TABS: 10.0000 mg | ORAL_TABLET | Freq: Every day | ORAL | 3 refills | Status: DC

## 2018-02-26 NOTE — Progress Notes (Signed)
Subjective:    Patient ID: Todd [name], male    DOB: 06/24/1941, 77 y.o.   MRN: [id number]  02/26/2018  Annual Exam; Hypertension; and Hyperlipidemia    HPI This 77 y.o. male presents for COMPLETE PHYSICAL EXAMINATION and follow-up of chronic medical conditions.   Weakness in legs upon standing.  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  Can still make a fist.  Hypertension:  BP running 106-124/68-80.  OSA: Patient reports good compliance with medication, good tolerance to medication, and good symptom control.  Managed by neurology.  Dizziness when laying down at night and standing up.   Visual Acuity Screening   Right eye Left eye Both eyes  Without correction: 20/20 20/20 20/20   With correction:       BP Readings from Last 3 Encounters:  03/08/18 (!) 141/85  02/26/18 122/70  10/09/17 126/70   Wt Readings from Last 3 Encounters:  03/08/18 224 lb (101.6 kg)  02/26/18 227 lb (103 kg)  10/09/17 232 lb 3.2 oz (105.3 kg)   Immunization History  Administered Date(s) Administered  . Influenza,inj,Quad PF,6+ Mos 07/15/2013, 07/21/2014, 07/07/2015, 07/06/2016, 08/04/2017  . Pneumococcal Conjugate-13 08/11/2014  . Pneumococcal Polysaccharide-23 03/10/2008, 08/23/2016  . Tdap 06/14/2010, 05/23/2014  . Zoster 11/07/2010  . Zoster Recombinat (Shingrix) 09/11/2017   Health Maintenance  Topic Date Due  . INFLUENZA VACCINE  06/07/2018  . COLONOSCOPY  10/21/2023  . TETANUS/TDAP  05/23/2024  . PNA vac Low Risk Adult  Completed    Review of Systems  Constitutional: Negative for activity change, appetite change, chills, diaphoresis, fatigue, fever and unexpected weight change.  HENT: Negative for congestion, dental problem, drooling, ear discharge, ear pain, facial swelling, hearing loss, mouth sores, nosebleeds, postnasal drip, rhinorrhea, sinus pressure, sneezing, sore throat, tinnitus, trouble swallowing and voice change.   Eyes: Negative for  photophobia, pain, discharge, redness, itching and visual disturbance.  Respiratory: Negative for apnea, cough, choking, chest tightness, shortness of breath, wheezing and stridor.   Cardiovascular: Negative for chest pain, palpitations and leg swelling.  Gastrointestinal: Negative for abdominal distention, abdominal pain, anal bleeding, blood in stool, constipation, diarrhea, nausea and vomiting.  Endocrine: Negative for cold intolerance, heat intolerance, polydipsia, polyphagia and polyuria.  Genitourinary: Negative for decreased urine volume, difficulty urinating, discharge, dysuria, enuresis, flank pain, frequency, genital sores, hematuria, penile pain, penile swelling, scrotal swelling, testicular pain and urgency.  Musculoskeletal: Positive for arthralgias. Negative for back pain, gait problem, joint swelling, myalgias, neck pain and neck stiffness.  Skin: Negative for color change, pallor, rash and wound.  Allergic/Immunologic: Negative for environmental allergies, food allergies and immunocompromised state.  Neurological: Positive for light-headedness. Negative for dizziness, tremors, seizures, syncope, facial asymmetry, speech difficulty, weakness, numbness and headaches.  Hematological: Negative for adenopathy. Does not bruise/bleed easily.  Psychiatric/Behavioral: Negative for agitation, behavioral problems, confusion, decreased concentration, dysphoric mood, hallucinations, self-injury, sleep disturbance and suicidal ideas. The patient is not nervous/anxious and is not hyperactive.     Past Medical History:  Diagnosis Date  . Allergy   . Angioedema   . Arthritis   . 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  [facility].  . Bladder stone   . Cancer (HCC)    skin cancer multiple; followed by [facility]/dermatology yearly.  . Cataract    4 YEARS - BILATERAL  . Colon polyps     Previous polyps on colonoscopy.  [facility].    . Hiatal hernia 10/20/2013   EGD confirmed.  [facility].  . Hyperlipemia    .  Hypertension    no meds  . Neuromuscular disorder (HCC)    numbness rt hand  . Seasonal allergies   . Sleep apnea    uses CPAP nightly   Past Surgical History:  Procedure Laterality Date  . BLADDER STONE REMOVAL  2011, 2018  . CARPAL TUNNEL RELEASE  01/17/2012   Procedure: CARPAL TUNNEL RELEASE;  Surgeon: [name]., MD;  Location: [facility];  Service: Orthopedics;  Laterality: Right;  . CERVICAL FUSION  1976  . COLONOSCOPY  10/20/2013   [facility].  Polyps.   . CYSTOSCOPY WITH LITHOLAPAXY N/A 01/09/2017   Procedure: CYSTOSCOPY WITH LITHOLAPAXY;  Surgeon: Marcine Matar, MD;  Location: [facility];  Service: Urology;  Laterality: N/A;  . ESOPHAGOGASTRODUODENOSCOPY  10/20/2013   [facility]; biopsy of area concernig for Barrett's; gastritis.  Marland Kitchen EYE SURGERY  2012   both cataracts  . FRACTURE SURGERY     3 fingers  . HOLMIUM LASER APPLICATION N/A 01/09/2017   Procedure: HOLMIUM LASER APPLICATION;  Surgeon: Marcine Matar, MD;  Location: [facility];  Service: Urology;  Laterality: N/A;  . QUADRICEPS TENDON REPAIR  2003   bilateral  . ULNAR NERVE TRANSPOSITION  01/17/2012   Procedure: ULNAR NERVE DECOMPRESSION/TRANSPOSITION;  Surgeon: [name]., MD;  Location: [facility];  Service: Orthopedics;  Laterality: Right;  decompression of ulnar nerve only  at right cubital tunnel  . ULNAR NERVE TRANSPOSITION Left 06/01/2017   Procedure: DECOMPRESSION ULNAR NERVE LEFT CUBITAL TUNNEL;  Surgeon: Betha Loa, MD;  Location: [facility];  Service: Orthopedics;  Laterality: Left;  . UPPER GASTROINTESTINAL ENDOSCOPY    . VASECTOMY     Allergies  Allergen Reactions  . Ace Inhibitors Cough  . Amoxicillin Nausea And Vomiting  . Darvon     Chest pain  . Toprol Xl [Metoprolol Tartrate]    Current Outpatient Medications on File Prior to Visit  Medication Sig Dispense Refill  . cetirizine (ZYRTEC) 10 MG tablet  Take 1 tablet (10 mg total) by mouth at bedtime. (Patient taking differently: Take 10 mg by mouth daily. ) 90 tablet 3  . Digestive Enzymes (PAPAYA AND ENZYMES PO) Take by mouth.    . fish oil-omega-3 fatty acids 1000 MG capsule daily. 2000 mg BID    . fluticasone (FLONASE) 50 MCG/ACT nasal spray Place 2 sprays into both nostrils daily. AS NEEDED 16 g 11  . GARLIC PO Take 161 mg by mouth 2 (two) times daily.     . Misc Natural Products (GLUCOSAMINE CHOND COMPLEX/MSM PO) Take by mouth.    . Multiple Vitamin (MULTIVITAMIN) tablet Take 1 tablet by mouth daily.    . saw palmetto 160 MG capsule Take 160 mg by mouth 2 (two) times daily.     No current facility-administered medications on file prior to visit.    Social History   Socioeconomic History  . Marital status: Married    Spouse name: Not on file  . Number of children: 2  . Years of education: 11  . Highest education level: Not on file  Occupational History  . Occupation: Retired  Engineer, production  . Financial resource strain: Not on file  . Food insecurity:    Worry: Not on file    Inability: Not on file  . Transportation needs:    Medical: Not on file    Non-medical: Not on file  Tobacco Use  . Smoking status: Former Smoker    Packs/day:  2.00    Years: 15.00    Pack years: 30.00    Types: Cigarettes    Last attempt to quit: 01/11/1974    Years since quitting: 44.3  . Smokeless tobacco: Never Used  Substance and Sexual Activity  . Alcohol use: Yes    Alcohol/week: 0.6 oz    Types: 1 Standard drinks or equivalent per week    Comment: occ  . Drug use: No  . Sexual activity: Never  Lifestyle  . Physical activity:    Days per week: Not on file    Minutes per session: Not on file  . Stress: Not on file  Relationships  . Social connections:    Talks on phone: Not on file    Gets together: Not on file    Attends religious service: Not on file    Active member of club or organization: Not on file    Attends meetings of  clubs or organizations: Not on file    Relationship status: Not on file  . Intimate partner violence:    Fear of current or ex partner: Not on file    Emotionally abused: Not on file    Physically abused: Not on file    Forced sexual activity: Not on file  Other Topics Concern  . Not on file  Social History Narrative   Marital status:  Married x 42 years      Children: 2 daughters; 3 grandchildren.      Employment; retired in 1998 from [employer]; retired age 17 from [employer] after 14 years.      Lives: with wife.      Tobacco: former smoker; quit at age 46.  Smoked 15 years.      Alcohol:  On vacation or special occasions.        Education: [employer].       Exercise: Gym 3-4 times a week for 2 hours.; exercises 7 days per week.   Elliptical for one hour.      Advanced Directives: none; +desires FULL CODE.        ADLs: independent with ADLs; drives.  Does not walk with assistant devices.     Drinks 3-4 caffeine drinks a day    Family History  Problem Relation Age of Onset  . Cirrhosis Father   . Hypertension Father   . Alcohol abuse Father   . Heart failure Mother        in 55s  . Arthritis Brother        Rheumatoid arthritis  . Hypertension Brother        Objective:    BP 122/70   Pulse 80   Temp 98 F (36.7 C) (Oral)   Resp 16   Ht 6' 0.84" (1.85 m)   Wt 227 lb (103 kg)   SpO2 97%   BMI 30.08 kg/m  Physical Exam  Constitutional: He is oriented to person, place, and time. He appears well-developed and well-nourished. No distress.  HENT:  Head: Normocephalic and atraumatic.  Right Ear: External ear normal.  Left Ear: External ear normal.  Nose: Nose normal.  Mouth/Throat: Oropharynx is clear and moist.  Eyes: Pupils are equal, round, and reactive to light. Conjunctivae and EOM are normal.  Neck: Normal range of motion. Neck supple. Carotid bruit is not present. No thyromegaly present.  Cardiovascular: Normal rate, regular rhythm, normal  heart sounds and intact distal pulses. Exam reveals no gallop and no friction rub.  No murmur heard.  Pulmonary/Chest: Effort normal and breath sounds normal. He has no wheezes. He has no rales.  Abdominal: Soft. Bowel sounds are normal. He exhibits no distension and no mass. There is no tenderness. There is no rebound and no guarding.  Musculoskeletal:       Right shoulder: Normal.       Left shoulder: Normal.       Cervical back: Normal.  Lymphadenopathy:    He has no cervical adenopathy.  Neurological: He is alert and oriented to person, place, and time. He has normal reflexes. No cranial nerve deficit. He exhibits normal muscle tone. Coordination normal.  Skin: Skin is warm and dry. No rash noted. He is not diaphoretic.  Psychiatric: He has a normal mood and affect. His behavior is normal. Judgment and thought content normal.   No results found. Depression screen [facility] 2/9 02/26/2018 08/28/2017 08/22/2017 08/04/2017 04/12/2017  Decreased Interest 0 0 0 0 0  Down, Depressed, Hopeless 0 0 0 0 0  PHQ - 2 Score 0 0 0 0 0   Fall Risk  02/26/2018 08/28/2017 08/22/2017 08/04/2017 04/12/2017  Falls in the past year? No No No No No  Number falls in past yr: - - - - -  Injury with Fall? - - - - -  Comment - - - - -    Functional Status Survey: Is the patient deaf or have difficulty hearing?: No Does the patient have difficulty seeing, even when wearing glasses/contacts?: No Does the patient have difficulty concentrating, remembering, or making decisions?: No Does the patient have difficulty walking or climbing stairs?: No Does the patient have difficulty dressing or bathing?: No Does the patient have difficulty doing errands alone such as visiting a doctor's office or shopping?: No     Assessment & Plan:   1. Routine physical examination   2. Pure hypercholesterolemia   3. Essential hypertension, benign   4. OSA on CPAP   5. Seasonal allergic rhinitis due to pollen   6. Class 1 obesity due  to excess calories with serious comorbidity and body mass index (BMI) of 31.0 to 31.9 in adult   7. Primary osteoarthritis of both hands   8. Hyperglycemia   9. Benign prostatic hyperplasia with nocturia     -anticipatory guidance provided --- exercise, weight loss, safe driving practices, aspirin 81mg  daily. -obtain age appropriate screening labs and labs for chronic disease management. -moderate fall risk; no evidence of depression; known hearing loss.  Discussed advanced directives and living will; also discussed end of life issues including code status.  -HTN: well controlled; recommend HOLDING Losartan for one week and checking blood pressure daily. Send BP readings to me via MyChart in 2 weeks.  Obtain labs for chronic disease management. Hypercholesterolemia: controlled; obtain labs; refills provided. OSA: managed by neurology; maintained on CPAP. -Osteoarthritis hands: stable; monitor.  Orders Placed This Encounter  Procedures  . CBC with Differential/Platelet  . Comprehensive metabolic panel    Order Specific Question:   Has the patient fasted?    Answer:   No  . Hemoglobin A1c  . Lipid panel    Order Specific Question:   Has the patient fasted?    Answer:   No  . PSA  . TSH  . POCT urinalysis dipstick   Meds ordered this encounter  Medications  . atorvastatin (LIPITOR) 20 MG tablet    Sig: Take 0.5 tablets (10 mg total) by mouth daily.    Dispense:  90 tablet    Refill:  3    Return in about 6 months (around 08/28/2018) for AWV, follow-up of blood pressure.   Kami Kube Paulita Fujita, M.D. Primary Care at [facility] [address] [place], [place]  [address] [phone] phone [phone] fax

## 2018-02-26 NOTE — Patient Instructions (Addendum)
HOLD [place] FOR ONE WEEK.     Preventive Care 75 Years and Older, Male Preventive care refers to lifestyle choices and visits with your health care provider that can promote health and wellness. What does preventive care include?  A yearly physical exam. This is also called an annual well check.  Dental exams once or twice a year.  Routine eye exams. Ask your health care provider how often you should have your eyes checked.  Personal lifestyle choices, including: ? Daily care of your teeth and gums. ? Regular physical activity. ? Eating a healthy diet. ? Avoiding tobacco and drug use. ? Limiting alcohol use. ? Practicing safe sex. ? Taking low doses of aspirin every day. ? Taking vitamin and mineral supplements as recommended by your health care provider. What happens during an annual well check? The services and screenings done by your health care provider during your annual well check will depend on your age, overall health, lifestyle risk factors, and family history of disease. Counseling Your health care provider may ask you questions about your:  Alcohol use.  Tobacco use.  Drug use.  Emotional well-being.  Home and relationship well-being.  Sexual activity.  Eating habits.  History of falls.  Memory and ability to understand (cognition).  Work and work Statistician.  Screening You may have the following tests or measurements:  Height, weight, and BMI.  Blood pressure.  Lipid and cholesterol levels. These may be checked every 5 years, or more frequently if you are over 75 years old.  Skin check.  Lung cancer screening. You may have this screening every year starting at age 75 if you have a 30-pack-year history of smoking and currently smoke or have quit within the past 15 years.  Fecal occult blood test (FOBT) of the stool. You may have this test every year starting at age 75.  Flexible sigmoidoscopy or colonoscopy. You may have a sigmoidoscopy  every 5 years or a colonoscopy every 10 years starting at age 75.  Prostate cancer screening. Recommendations will vary depending on your family history and other risks.  Hepatitis C blood test.  Hepatitis B blood test.  Sexually transmitted disease (STD) testing.  Diabetes screening. This is done by checking your blood sugar (glucose) after you have not eaten for a while (fasting). You may have this done every 1-3 years.  Abdominal aortic aneurysm (AAA) screening. You may need this if you are a current or former smoker.  Osteoporosis. You may be screened starting at age 75 if you are at high risk.  Talk with your health care provider about your test results, treatment options, and if necessary, the need for more tests. Vaccines Your health care provider may recommend certain vaccines, such as:  Influenza vaccine. This is recommended every year.  Tetanus, diphtheria, and acellular pertussis (Tdap, Td) vaccine. You may need a Td booster every 10 years.  Varicella vaccine. You may need this if you have not been vaccinated.  Zoster vaccine. You may need this after age 18.  Measles, mumps, and rubella (MMR) vaccine. You may need at least one dose of MMR if you were born in 1957 or later. You may also need a second dose.  Pneumococcal 13-valent conjugate (PCV13) vaccine. One dose is recommended after age 14.  Pneumococcal polysaccharide (PPSV23) vaccine. One dose is recommended after age 22.  Meningococcal vaccine. You may need this if you have certain conditions.  Hepatitis A vaccine. You may need this if you have certain conditions or  if you travel or work in places where you may be exposed to hepatitis A.  Hepatitis B vaccine. You may need this if you have certain conditions or if you travel or work in places where you may be exposed to hepatitis B.  Haemophilus influenzae type b (Hib) vaccine. You may need this if you have certain risk factors.  Talk to your health care  provider about which screenings and vaccines you need and how often you need them. This information is not intended to replace advice given to you by your health care provider. Make sure you discuss any questions you have with your health care provider. Document Released: 11/20/2015 Document Revised: 07/13/2016 Document Reviewed: 08/25/2015 Elsevier Interactive Patient Education  2018 [employer].    IF you received an x-ray today, you will receive an invoice from [facility]. Please contact [facility] at [phone] with questions or concerns regarding your invoice.   IF you received labwork today, you will receive an invoice from  [facility]. Please contact [facility] at [phone] with questions or concerns regarding your invoice.   Our billing staff will not be able to assist you with questions regarding bills from these companies.  You will be contacted with the lab results as soon as they are available. The fastest way to get your results is to activate your My Chart account. Instructions are located on the last page of this paperwork. If you have not heard from [place] regarding the results in 2 weeks, please contact this office.

## 2018-02-27 LAB — COMPREHENSIVE METABOLIC PANEL
A/G RATIO: 1.9 (ref 1.2–2.2)
ALT: 35 IU/L (ref 0–44)
AST: 32 IU/L (ref 0–40)
Albumin: 4.6 g/dL (ref 3.5–4.8)
Alkaline Phosphatase: 81 IU/L (ref 39–117)
BUN/Creatinine Ratio: 14 (ref 10–24)
BUN: 14 mg/dL (ref 8–27)
Bilirubin Total: 0.7 mg/dL (ref 0.0–1.2)
CALCIUM: 9.3 mg/dL (ref 8.6–10.2)
CO2: 26 mmol/L (ref 20–29)
CREATININE: 0.98 mg/dL (ref 0.76–1.27)
Chloride: 98 mmol/L (ref 96–106)
GFR calc Af Amer: 87 mL/min/{1.73_m2} (ref >59)
GFR, EST NON AFRICAN AMERICAN: 75 mL/min/{1.73_m2} (ref >59)
Globulin, Total: 2.4 g/dL (ref 1.5–4.5)
Glucose: 107 mg/dL — ABNORMAL HIGH (ref 65–99)
Potassium: 4.5 mmol/L (ref 3.5–5.2)
Sodium: 138 mmol/L (ref 134–144)
Total Protein: 7 g/dL (ref 6.0–8.5)

## 2018-02-27 LAB — CBC WITH DIFFERENTIAL/PLATELET
Basophils Absolute: 0 10*3/uL (ref 0.0–0.2)
Basos: 1 %
EOS (ABSOLUTE): 0.2 10*3/uL (ref 0.0–0.4)
Eos: 5 %
Hematocrit: 42.2 % (ref 37.5–51.0)
Hemoglobin: 14.3 g/dL (ref 13.0–17.7)
IMMATURE GRANULOCYTES: 0 %
Immature Grans (Abs): 0 10*3/uL (ref 0.0–0.1)
Lymphocytes Absolute: 1.3 10*3/uL (ref 0.7–3.1)
Lymphs: 30 %
MCH: 28.9 pg (ref 26.6–33.0)
MCHC: 33.9 g/dL (ref 31.5–35.7)
MCV: 85 fL (ref 79–97)
MONOS ABS: 0.4 10*3/uL (ref 0.1–0.9)
Monocytes: 9 %
NEUTROS PCT: 55 %
Neutrophils Absolute: 2.3 10*3/uL (ref 1.4–7.0)
PLATELETS: 249 10*3/uL (ref 150–379)
RBC: 4.95 x10E6/uL (ref 4.14–5.80)
RDW: 14.5 % (ref 12.3–15.4)
WBC: 4.2 10*3/uL (ref 3.4–10.8)

## 2018-02-27 LAB — HEMOGLOBIN A1C
ESTIMATED AVERAGE GLUCOSE: 131 mg/dL
HEMOGLOBIN A1C: 6.2 % — AB (ref 4.8–5.6)

## 2018-02-27 LAB — LIPID PANEL
CHOLESTEROL TOTAL: 136 mg/dL (ref 100–199)
Chol/HDL Ratio: 3.2 ratio (ref 0.0–5.0)
HDL: 42 mg/dL (ref >39)
LDL Calculated: 66 mg/dL (ref 0–99)
TRIGLYCERIDES: 141 mg/dL (ref 0–149)
VLDL Cholesterol Cal: 28 mg/dL (ref 5–40)

## 2018-02-27 LAB — TSH: TSH: 2.1 u[IU]/mL (ref 0.450–4.500)

## 2018-02-27 LAB — PSA: Prostate Specific Ag, Serum: 0.7 ng/mL (ref 0.0–4.0)

## 2018-03-01 ENCOUNTER — Encounter: Payer: Self-pay | Admitting: Family Medicine

## 2018-03-04 ENCOUNTER — Encounter: Payer: Self-pay | Admitting: Family Medicine

## 2018-03-04 ENCOUNTER — Encounter: Payer: Self-pay | Admitting: Neurology

## 2018-03-08 ENCOUNTER — Ambulatory Visit (INDEPENDENT_AMBULATORY_CARE_PROVIDER_SITE_OTHER): Payer: Medicare Other | Admitting: Neurology

## 2018-03-08 ENCOUNTER — Encounter: Payer: Self-pay | Admitting: Neurology

## 2018-03-08 VITALS — BP 141/85 | HR 82 | Ht 71.0 in | Wt 224.0 lb

## 2018-03-08 DIAGNOSIS — G4733 Obstructive sleep apnea (adult) (pediatric): Secondary | ICD-10-CM

## 2018-03-08 NOTE — Patient Instructions (Addendum)
Please continue using your BiPAP regularly. While your insurance requires that you use BiPAP at least 4 hours each night on 70% of the nights, I recommend, that you not skip any nights and use it throughout the night if you can. Getting used to BiPAP and staying with the treatment long term does take time and patience and discipline. Untreated obstructive sleep apnea when it is moderate to severe can have an adverse impact on cardiovascular health and raise her risk for heart disease, arrhythmias, hypertension, congestive heart failure, stroke and diabetes. Untreated obstructive sleep apnea causes sleep disruption, nonrestorative sleep, and sleep deprivation. This can have an impact on your day to day functioning and cause daytime sleepiness and impairment of cognitive function, memory loss, mood disturbance, and problems focussing. Using BiPAP regularly can improve these symptoms.  Keep up the good work! We will see you back in 6 months for sleep apnea check up, you can see Jinny Blossom again, and if you continue to do well on BiPAP, we will see you once a year thereafter.

## 2018-03-08 NOTE — Progress Notes (Signed)
Subjective:    Patient ID: Todd [name] is a 77 y.o. male.  HPI     Interim history:   Todd [name] is a 78 year old right-handed gentleman with an underlying medical history of hypertension, history of arthritis, s/p b/l knee surgeries in 2003 secondary to fall down stairs, with tendon tears b/l, skin cancer, cataract with status post repairs, hiatal hernia, hyperlipidemia, degenerative neck d/s with s/p surgery in '76, history of angioedema, and obesity, who presents for follow-up consultation of his sleep disordered breathing, after a recent sleep study. The patient is unaccompanied today. I last saw him on 04/11/2017, at which time he was on CPAP of 13 cm with full compliance. He had an increased AHI of 21.7 per hour on a pressure of 13 cm. I suggested we increase his pressure to 15 cm at the time. He was seen in follow-up by Ward Givens on 10/09/2017, at which time he was compliant with his CPAP of 15 cm but had significant residual sleep disordered breathing. I suggested he return for a full night read titration study. He had a CPAP titration study on 12/17/2017. I went over his test results with him in detail today. CPAP was initiated at 6 cm and titrated to 13 cm. His AHI was elevated on 13 cm and he was therefore switched to BiPAP therapy at 14/10 and further titrated to 17/13. He had a reduced sleep efficiency at 55.3%, sleep latency was 24 minutes and REM latency was delayed at 139 minutes. He had an increased percentage of slow-wave sleep and a decreased percentage of REM sleep. On the final pressure of 17/13 his AHI was still elevated at 25.7, O2 nadir was 92%, non-REM sleep was achieved. I recommended a home treatment pressure of 18/14.   Today, 03/08/2018: I reviewed his BiPAP compliance data from 02/03/2018 through 03/04/2018, which is a total of 30 days, during which time he used his BiPAP every night with percent used days greater than 4 hours at 100%, indicating superb compliance with an  average usage of 8 hours and 2 minutes, residual AHI improved at 12.5 per hour, leak low with the 95th percentile at 1.6 L/m on a pressure of 18/14. He reports doing well, he uses a fullface mask. He notes that he likes to sleep on his sides and sometimes he can feel a leak. He feels that he is well rested. He indicates full compliance and no issues with the new settings of the machine and his mask. He is very motivated to continue with treatment. He is working on dietary changes, he has cut out white bread, he is watching his blood pressure, his losartan was discontinued recently and he is monitoring his blood pressure values at home. He had blood work on 02/26/2018 which I reviewed, A1c was 6.2 in the prediabetes range, blood sugar was 107. He has reduced his sugar intake.  The patient's allergies, current medications, family history, past medical history, past social history, past surgical history and problem list were reviewed and updated as appropriate.    Previously (copied from previous notes for reference):  I saw him on 11/28/2016, at which time he reported doing well with CPAP therapy, had adjusted well to treatment and was fully compliant. Due to his residual AHI of 28.7 per hour at the time I suggested we increase his CPAP pressure from 8 cm to 10 cm. He had an interim appointment with Vaughan Browner on 01/26/2017, at which time he was again compliant with CPAP therapy  but residual AHI was still suboptimal at over 30 per hour. Symptoms were mostly obstructive in nature and I suggested we increase his pressure to 13 cm.    I reviewed his CPAP compliance data from 03/11/2017 through 04/09/2017 which is a total of 30 days, during which time he used his machine 100%, average usage of 7 hours and 38 minutes, residual AHI improved but still elevated at 21.7 per hour, Mostly obstructive in nature, leak acceptable with the 95th percentile at 14.2 L/m on a pressure of 13 cm with EPR of 3. He reports doing  well, tolerated the increase in pressure. Has had some numbness in the pinky fingers b/l, has a Hx of degenerative cervical spine d/s, s/p surgery in 78. Some R knee discomfort.  Using a Lg FFM, some leak, but overall okay, still feels he has done quite well with using CPAP in the sense that he feels improved, daytime energy better, headaches improved.   I first met him on 08/09/2016 at the request of his primary care physician, at which time he reported snoring and excessive daytime somnolence as well as worsening nocturia. I invited him for sleep study. He had a split-night sleep study on 08/21/2016. We went over his test results today. Baseline sleep efficiency was markedly reduced at 50.8%, wake after sleep onset was 83 minutes, REM latency was normal, sleep latency delayed. Total AHI was 28.8 per hour, REM AHI was 67.5 per hour, O2 nadir was 83%.   He was titrated on CPAP from 5 cm to 8 cm, AHI was 0 per hour on a pressure of 8 cm. He had severe PLMS during the baseline and the CPAP titration portion of the study with minimal to mild arousals.   I reviewed his CPAP compliance data from 10/29/2016 through 11/27/2016, which is a total of 30 days, during which time he used his CPAP every night with percent used days greater than 4 hours at 100%, indicating superb compliance with an average usage of 8 hours and 11 minutes, residual AHI elevated at 28.7 per hour, mostly obstructive in nature, leak low with the 95th percentile at 7.2 L/m on a pressure of 8 cm with EPR of 3.   08/21/2016: He reports snoring and excessive daytime somnolence. His Epworth sleepiness score is 14 out of 24 today. Fatigue score is 15 out of 63. Snoring can be loud. He denies restless leg symptoms. He has had nocturia for years, worse in the past year with 3-4 bathroom visits per night. He has seen urologist for this was advised that his prostate looked fine. He quit smoking in 1973, drinks alcohol occasionally, and drinks about 3  cups of coffee per day but typically no sodas and decaf tea only. I reviewed your office note from 07/06/2016. 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 He tries to exercise regularly, and tries to drink enough water. He has gained weight in the last few years, especially after his retirement.  He does not watch TV in bed, lives with his wife, has 2 grown daughters, one in [place] and the other in [place], [place]. He suspects that his father may have had obstructive sleep apnea. He tries to keep a scheduled for his sleep time and wake up time. They have no pets at this time. He likes to travel since his retirement.  His Past Medical History Is Significant For: Past Medical History:  Diagnosis Date  .  Allergy   . Angioedema   . Arthritis   . 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  [facility].  . Bladder stone   . Cancer ([place])    skin cancer multiple; followed by [facility]/dermatology yearly.  . Cataract    4 YEARS - BILATERAL  . Colon polyps     Previous polyps on colonoscopy.  [facility].    . Hiatal hernia 10/20/2013   EGD confirmed.  [facility].  . Hyperlipemia   . Hypertension    no meds  . Neuromuscular disorder ([facility])    numbness rt hand  . Seasonal allergies   . Sleep apnea    uses CPAP nightly    His Past Surgical History Is Significant For: Past Surgical History:  Procedure Laterality Date  . BLADDER STONE REMOVAL  2011, 2018  . CARPAL TUNNEL RELEASE  01/17/2012   Procedure: CARPAL TUNNEL RELEASE;  Surgeon: Cammie Sickle., MD;  Location: [facility];  Service: Orthopedics;  Laterality: Right;  . [place]  . COLONOSCOPY  10/20/2013   [facility].  Polyps.   . CYSTOSCOPY WITH LITHOLAPAXY N/A 01/09/2017   Procedure: CYSTOSCOPY WITH LITHOLAPAXY;  Surgeon: Franchot Gallo, MD;  Location: [facility];  Service: Urology;  Laterality: N/A;  . ESOPHAGOGASTRODUODENOSCOPY  10/20/2013   [facility];  biopsy of area concernig for Barrett's; gastritis.  Marland Kitchen EYE SURGERY  2012   both cataracts  . FRACTURE SURGERY     3 fingers  . HOLMIUM LASER APPLICATION N/A 05/09/2201   Procedure: HOLMIUM LASER APPLICATION;  Surgeon: Franchot Gallo, MD;  Location: [facility];  Service: Urology;  Laterality: N/A;  . QUADRICEPS TENDON REPAIR  2003   bilateral  . ULNAR NERVE TRANSPOSITION  01/17/2012   Procedure: ULNAR NERVE DECOMPRESSION/TRANSPOSITION;  Surgeon: Cammie Sickle., MD;  Location: [facility];  Service: Orthopedics;  Laterality: Right;  decompression of ulnar nerve only  at right cubital tunnel  . ULNAR NERVE TRANSPOSITION Left 06/01/2017   Procedure: DECOMPRESSION ULNAR NERVE LEFT CUBITAL TUNNEL;  Surgeon: Leanora Cover, MD;  Location: [facility];  Service: Orthopedics;  Laterality: Left;  . UPPER GASTROINTESTINAL ENDOSCOPY    . VASECTOMY      His Family History Is Significant For: Family History  Problem Relation Age of Onset  . Cirrhosis Father   . Hypertension Father   . Alcohol abuse Father   . Heart failure Mother        in 19s  . Arthritis Brother        Rheumatoid arthritis  . Hypertension Brother     His Social History Is Significant For: Social History   Socioeconomic History  . Marital status: Married    Spouse name: Not on file  . Number of children: 2  . Years of education: 21  . Highest education level: Not on file  Occupational History  . Occupation: Retired  Scientific laboratory technician  . Financial resource strain: Not on file  . Food insecurity:    Worry: Not on file    Inability: Not on file  . Transportation needs:    Medical: Not on file    Non-medical: Not on file  Tobacco Use  . Smoking status: Former Smoker    Packs/day: 2.00    Years: 15.00    Pack years: 30.00    Types: Cigarettes    Last attempt to quit: 01/11/1974    Years since quitting: 44.1  . Smokeless  tobacco: Never Used  Substance and Sexual Activity   . Alcohol use: Yes    Alcohol/week: 0.5 oz    Types: 1 Standard drinks or equivalent per week    Comment: occ  . Drug use: No  . Sexual activity: Never  Lifestyle  . Physical activity:    Days per week: Not on file    Minutes per session: Not on file  . Stress: Not on file  Relationships  . Social connections:    Talks on phone: Not on file    Gets together: Not on file    Attends religious service: Not on file    Active member of club or organization: Not on file    Attends meetings of clubs or organizations: Not on file    Relationship status: Not on file  Other Topics Concern  . Not on file  Social History Narrative   Marital status:  Married x 42 years      Children: 2 daughters; 3 grandchildren.      Employment; retired in 1998 from [employer]; retired age 37 from [employer] after 14 years.      Lives: with wife.      Tobacco: former smoker; quit at age 37.  Smoked 15 years.      Alcohol:  On vacation or special occasions.        Education: [employer].       Exercise: Gym 3-4 times a week for 2 hours.; exercises 7 days per week.   Elliptical for one hour.      Advanced Directives: none; +desires FULL CODE.        ADLs: independent with ADLs; drives.  Does not walk with assistant devices.     Drinks 3-4 caffeine drinks a day     His Allergies Are:  Allergies  Allergen Reactions  . Ace Inhibitors Cough  . Amoxicillin Nausea And Vomiting  . Darvon     Chest pain  . Toprol Xl [Metoprolol Tartrate]   :   His Current Medications Are:  Outpatient Encounter Medications as of 03/08/2018  Medication Sig  . atorvastatin (LIPITOR) 20 MG tablet Take 0.5 tablets (10 mg total) by mouth daily.  . cetirizine (ZYRTEC) 10 MG tablet Take 1 tablet (10 mg total) by mouth at bedtime. (Patient taking differently: Take 10 mg by mouth daily. )  . Digestive Enzymes (PAPAYA AND ENZYMES PO) Take by mouth.  . fish oil-omega-3 fatty acids 1000 MG capsule daily. 2000 mg  BID  . fluticasone (FLONASE) 50 MCG/ACT nasal spray Place 2 sprays into both nostrils daily. AS NEEDED  . GARLIC PO Take 517 mg by mouth 2 (two) times daily.   . Misc Natural Products (GLUCOSAMINE CHOND COMPLEX/MSM PO) Take by mouth.  . Multiple Vitamin (MULTIVITAMIN) tablet Take 1 tablet by mouth daily.  . saw palmetto 160 MG capsule Take 160 mg by mouth 2 (two) times daily.  . [DISCONTINUED] losartan (COZAAR) 25 MG tablet Take 1 tablet (25 mg total) by mouth daily.   No facility-administered encounter medications on file as of 03/08/2018.   : Review of Systems:  Out of a complete 14 point review of systems, all are reviewed and negative with the exception of these symptoms as listed below:  Review of Systems  Neurological:       Pt presents today to discuss his bipap. Pt reports that his bipap is going well.    Objective:  Neurological Exam  Physical Exam Physical Examination:  Vitals:   03/08/18 0937  BP: (!) 141/85  Pulse: 82    General Examination: The patient is a very pleasant 77 y.o. male in no acute distress. He appears well-developed and well-nourished and well groomed.   HEENT:Normocephalic, atraumatic, pupils are equal, round and reactive to light and accommodation. Status post cataract repairs. Extraocular tracking is good without limitation to gaze excursion or nystagmus noted. Normal smooth pursuit is noted. Hearing is grossly intactWith bilateral hearing aids in place. Face is symmetric with normal facial animation and normal facial sensation. Speech is clear with no dysarthria noted. There is no hypophonia. There is no lip, neck/head, jaw or voice tremor. Neck is supple with full range of passive and active motion. Oropharynx exam reveals: mild to moderate mouth dryness, adequate dental hygiene and moderate airway crowding. Mallampati is class II. Tongue protrudes centrally and palate elevates symmetrically. Nasal inspection reveals no significant nasal mucosal  bogginess or redness but significant septal deviation to the R.   Chest:Clear to auscultation without wheezing, rhonchi or crackles noted.  Heart:S1+S2+0, regular and normal without murmurs, rubs or gallops noted.   Abdomen:Soft, non-tender and non-distended with normal bowel sounds appreciated on auscultation.  Extremities:There is no pitting edema in the distal lower extremities bilaterally.   Skin: Warm and dry without trophic changes noted. There are no varicose veins. Chronic sun exposure type changes  Musculoskeletal: exam reveals no obvious joint deformities, tenderness or joint swelling or erythema, has decrease in ROM both arms and both knees, unremarkable surgical scars both knees.   Neurologically:  Mental status: The patient is awake, alert and oriented in all 4 spheres. His immediate and remote memory, attention, language skills and fund of knowledge are appropriate. There is no evidence of aphasia, agnosia, apraxia or anomia. Speech is clear with normal prosody and enunciation. Thought process is linear. Mood is normal and affect is normal.  Cranial nerves II - XII are as described above under HEENT exam. Motor exam: Normal bulk, strength and tone is noted. There is no drift, tremor or rebound. Reflexes are 1+ in the UEs and diminished in the LEs. Fine motor skills and coordination: grossly intact.  Cerebellar testing: No dysmetria or intention tremor. There is no truncal or gait ataxia.  Sensory exam: intact to light touch inthe upper and lower extremities.  Gait, station and balance: He stands easily. No veering to one side is noted. No leaning to one side is noted. Posture is age-appropriate and stance is narrow based. Gait shows normal stride length and normal pace. No problems turning are noted.   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 He has always been compliant with treatment and very highly commended for this. We had him return for a full night titration study in February 2019 after which he started BiPAP therapy, current pressure of 18/14 with full compliance and better results as far as his residual sleep-disordered breathing. He is not perfect with his residual sleep-disordered breathing, current average AHI of 12.5 per hour but much better and we mutually agreed to continue with treatment at the current setting via full  facemask. He is doing well overall, blood pressure slightly higher than his average typically but nothing to worry about, he monitors it at home as well. He is going to follow-up routinely with [place] for a brief recheck in 6 months, he can see Ward Givens, NP, again the time. He can hopefully follow-up on a yearly basis after that. I answered all his questions today and he was in agreement.

## 2018-04-04 ENCOUNTER — Encounter: Payer: Self-pay | Admitting: Family Medicine

## 2018-05-03 ENCOUNTER — Encounter (HOSPITAL_BASED_OUTPATIENT_CLINIC_OR_DEPARTMENT_OTHER): Payer: Self-pay | Admitting: Emergency Medicine

## 2018-05-03 ENCOUNTER — Emergency Department (HOSPITAL_BASED_OUTPATIENT_CLINIC_OR_DEPARTMENT_OTHER): Payer: Medicare Other

## 2018-05-03 ENCOUNTER — Other Ambulatory Visit: Payer: Self-pay

## 2018-05-03 ENCOUNTER — Emergency Department (HOSPITAL_BASED_OUTPATIENT_CLINIC_OR_DEPARTMENT_OTHER)
Admission: EM | Admit: 2018-05-03 | Discharge: 2018-05-03 | Disposition: A | Discharge status: Home / Self Care | Payer: Medicare Other | Attending: Emergency Medicine | Admitting: Emergency Medicine

## 2018-05-03 DIAGNOSIS — Z87891 Personal history of nicotine dependence: Secondary | ICD-10-CM | POA: Diagnosis not present

## 2018-05-03 DIAGNOSIS — R109 Unspecified abdominal pain: Secondary | ICD-10-CM | POA: Diagnosis present

## 2018-05-03 DIAGNOSIS — N201 Calculus of ureter: Secondary | ICD-10-CM | POA: Insufficient documentation

## 2018-05-03 DIAGNOSIS — Z85828 Personal history of other malignant neoplasm of skin: Secondary | ICD-10-CM | POA: Diagnosis not present

## 2018-05-03 DIAGNOSIS — I10 Essential (primary) hypertension: Secondary | ICD-10-CM | POA: Insufficient documentation

## 2018-05-03 DIAGNOSIS — Z79899 Other long term (current) drug therapy: Secondary | ICD-10-CM | POA: Insufficient documentation

## 2018-05-03 LAB — COMPREHENSIVE METABOLIC PANEL
ALT: 25 U/L (ref 0–44)
AST: 28 U/L (ref 15–41)
Albumin: 4.1 g/dL (ref 3.5–5.0)
Alkaline Phosphatase: 76 U/L (ref 38–126)
Anion gap: 8 (ref 5–15)
BILIRUBIN TOTAL: 0.7 mg/dL (ref 0.3–1.2)
BUN: 21 mg/dL (ref 8–23)
CHLORIDE: 103 mmol/L (ref 98–111)
CO2: 27 mmol/L (ref 22–32)
CREATININE: 0.98 mg/dL (ref 0.61–1.24)
Calcium: 8.8 mg/dL — ABNORMAL LOW (ref 8.9–10.3)
Glucose, Bld: 137 mg/dL — ABNORMAL HIGH (ref 70–99)
POTASSIUM: 3.5 mmol/L (ref 3.5–5.1)
Sodium: 138 mmol/L (ref 135–145)
TOTAL PROTEIN: 7.3 g/dL (ref 6.5–8.1)

## 2018-05-03 LAB — URINALYSIS, ROUTINE W REFLEX MICROSCOPIC
Bilirubin Urine: NEGATIVE
GLUCOSE, UA: NEGATIVE mg/dL
Ketones, ur: NEGATIVE mg/dL
LEUKOCYTES UA: NEGATIVE
NITRITE: NEGATIVE
PH: 7.5 (ref 5.0–8.0)
PROTEIN: NEGATIVE mg/dL
Specific Gravity, Urine: 1.015 (ref 1.005–1.030)

## 2018-05-03 LAB — URINALYSIS, MICROSCOPIC (REFLEX)

## 2018-05-03 LAB — CBC
HEMATOCRIT: 41.2 % (ref 39.0–52.0)
Hemoglobin: 14.4 g/dL (ref 13.0–17.0)
MCH: 29.3 pg (ref 26.0–34.0)
MCHC: 35 g/dL (ref 30.0–36.0)
MCV: 83.9 fL (ref 78.0–100.0)
PLATELETS: 226 10*3/uL (ref 150–400)
RBC: 4.91 MIL/uL (ref 4.22–5.81)
RDW: 13.7 % (ref 11.5–15.5)
WBC: 6.4 10*3/uL (ref 4.0–10.5)

## 2018-05-03 LAB — LIPASE, BLOOD: Lipase: 30 U/L (ref 11–51)

## 2018-05-03 MED ORDER — IOPAMIDOL (ISOVUE-300) INJECTION 61%
100.0000 mL | Freq: Once | INTRAVENOUS | Status: AC | PRN
  Administered 2018-05-03: 100 mL via INTRAVENOUS

## 2018-05-03 NOTE — ED Provider Notes (Signed)
[facility] Provider Note   CSN: [id number] Arrival date & time: 05/03/18  1015     History   Chief Complaint Chief Complaint  Patient presents with  . abdominal swelling    HPI TERAN KNITTLE is a 77 y.o. male.  HPI   Patient presents today for sensation of abdominal swelling.  He states this has been going on off and on for about a year, has not sought care for this in the past.  He came in today because this episode was worse than previous.  This began 2 days ago when he awoke and noticed the sensation of swelling in his abdomen as well as some discomfort in his left flank and hip.  He vomited shortly after noticing this sensation, prior to eating breakfast.  He was also able to do his daily bicycle exercise and stretching that morning.  No significant symptoms yesterday of this swelling, however did not have his daily bowel movement yesterday.  This morning he awoke and felt the swelling sensation again, did have a normal bowel movement and felt he needed to have additional bowel movements this morning without producing 1.  This morning he did not vomit but felt nauseous when this swelling was going on.  He has not felt any but endorses feeling fever or chills, hot with his nauseous episode this morning.  He does have a primary care doctor.  States he was a heavy drinker about 50 years ago for 10 years but no diagnosed liver disease.  He has no CHF to his knowledge does not accumulate edema in his lower extremities per his report.  Past Medical History:  Diagnosis Date  . Allergy   . Angioedema   . Arthritis   . 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  [facility].  . Bladder stone   . Cancer ([place])    skin cancer multiple; followed by [facility]/dermatology yearly.  . Cataract    4 YEARS - BILATERAL  . Colon polyps     Previous polyps on colonoscopy.  [facility].    . Hiatal hernia 10/20/2013   EGD confirmed.  [facility].  . Hyperlipemia   . Hypertension    no  meds  . Neuromuscular disorder ([facility])    numbness rt hand  . Seasonal allergies   . Sleep apnea    uses CPAP nightly    Patient Active Problem List   Diagnosis Date Noted  . Bladder stone 03/20/2017  . 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 OSA on CPAP 02/21/2017  . Primary osteoarthritis of both hands 03/01/2016  . Pure hypercholesterolemia 03/01/2016  . Essential hypertension, benign 03/01/2016  . Seasonal allergic rhinitis due to pollen 03/01/2016  . BMI 31.0-31.9,adult 03/01/2016  . Obesity 03/01/2016  . Angioedema 02/16/2013    Past Surgical History:  Procedure Laterality Date  . BLADDER STONE REMOVAL  2011, 2018  . CARPAL TUNNEL RELEASE  01/17/2012   Procedure: CARPAL TUNNEL RELEASE;  Surgeon: Cammie Sickle., MD;  Location: [facility];  Service: Orthopedics;  Laterality: Right;  . [place]  . COLONOSCOPY  10/20/2013   [facility].  Polyps.   . CYSTOSCOPY WITH LITHOLAPAXY N/A 01/09/2017   Procedure: CYSTOSCOPY WITH LITHOLAPAXY;  Surgeon: Franchot Gallo, MD;  [facility];  Service: Urology;  Laterality: N/A;  . ESOPHAGOGASTRODUODENOSCOPY  10/20/2013   [facility]; biopsy of area concernig for Barrett's; gastritis.  Marland Kitchen  EYE SURGERY  2012   both cataracts  . FRACTURE SURGERY     3 fingers  . HOLMIUM LASER APPLICATION N/A 11/11/4006   Procedure: HOLMIUM LASER APPLICATION;  Surgeon: Franchot Gallo, MD;  Location: [facility];  Service: Urology;  Laterality: N/A;  . QUADRICEPS TENDON REPAIR  2003   bilateral  . ULNAR NERVE TRANSPOSITION  01/17/2012   Procedure: ULNAR NERVE DECOMPRESSION/TRANSPOSITION;  Surgeon: Cammie Sickle., MD;  Location: [facility];  Service: Orthopedics;  Laterality: Right;  decompression of ulnar nerve only  at right cubital tunnel  . ULNAR NERVE TRANSPOSITION Left 06/01/2017   Procedure: DECOMPRESSION ULNAR NERVE LEFT  CUBITAL TUNNEL;  Surgeon: Leanora Cover, MD;  Location: [facility];  Service: Orthopedics;  Laterality: Left;  . UPPER GASTROINTESTINAL ENDOSCOPY    . VASECTOMY          Home Medications    Prior to Admission medications   Medication Sig Start Date End Date Taking? Authorizing Provider  atorvastatin (LIPITOR) 20 MG tablet Take 0.5 tablets (10 mg total) by mouth daily. 02/26/18   Wardell Honour, MD  cetirizine (ZYRTEC) 10 MG tablet Take 1 tablet (10 mg total) by mouth at bedtime. Patient taking differently: Take 10 mg by mouth daily.  08/23/16   Wardell Honour, MD  Digestive Enzymes (PAPAYA AND ENZYMES PO) Take by mouth.    [provider]  fish oil-omega-3 fatty acids 1000 MG capsule daily. 2000 mg BID    [provider]  fluticasone (FLONASE) 50 MCG/ACT nasal spray Place 2 sprays into both nostrils daily. AS NEEDED 08/23/16   Wardell Honour, MD  GARLIC PO Take 676 mg by mouth 2 (two) times daily.     [provider]  Misc Natural Products (GLUCOSAMINE CHOND COMPLEX/MSM PO) Take by mouth.    [provider]  Multiple Vitamin (MULTIVITAMIN) tablet Take 1 tablet by mouth daily.    [provider]  saw palmetto 160 MG capsule Take 160 mg by mouth 2 (two) times daily.    [provider]    Family History Family History  Problem Relation Age of Onset  . Cirrhosis Father   . Hypertension Father   . Alcohol abuse Father   . Heart failure Mother        in 53s  . Arthritis Brother        Rheumatoid arthritis  . Hypertension Brother     Social History Social History   Tobacco Use  . Smoking status: Former Smoker    Packs/day: 2.00    Years: 15.00    Pack years: 30.00    Types: Cigarettes    Last attempt to quit: 01/11/1974    Years since quitting: 44.3  . Smokeless tobacco: Never Used  Substance Use Topics  . Alcohol use: Yes    Alcohol/week: 0.6 oz    Types: 1 Standard drinks or equivalent per week     Comment: occ  . Drug use: No     Allergies   Ace inhibitors; Amoxicillin; Darvon; and Toprol xl [metoprolol tartrate]   Review of Systems Review of Systems  Constitutional: Positive for appetite change. Negative for chills, fever and unexpected weight change.  HENT: Negative for rhinorrhea and trouble swallowing.   Respiratory: Negative for shortness of breath.   Cardiovascular: Negative for chest pain.  Gastrointestinal: Positive for abdominal distention, nausea and vomiting. Negative for abdominal pain, blood in stool, constipation and diarrhea.  Genitourinary: Positive  for flank pain. Negative for difficulty urinating.  Musculoskeletal: Negative for myalgias.  Skin: Negative for rash.  Neurological: Negative for dizziness.     Physical Exam Updated Vital Signs BP 120/67 (BP Location: Left Arm)   Pulse 69   Temp 98.3 F (36.8 C) (Oral)   Resp 18   Ht 5\' 11"  (1.803 m)   Wt 95.3 kg (210 lb)   SpO2 97%   BMI 29.29 kg/m   Physical Exam  Constitutional: He is oriented to person, place, and time. He appears well-developed and well-nourished. No distress.  HENT:  Head: Normocephalic.  Nose: Nose normal.  Mouth/Throat: Oropharynx is clear and moist.  Eyes: Conjunctivae and EOM are normal.  Neck: Normal range of motion. Neck supple.  Cardiovascular: Normal rate and regular rhythm.  No murmur heard. Pulmonary/Chest: Effort normal and breath sounds normal.  Abdominal: Soft. Bowel sounds are normal. He exhibits no distension. There is no tenderness. A hernia is present.  4 x 5 cm epigastric ventral hernia. L flank tenderness, no CVA tenderness.   Musculoskeletal: Normal range of motion. He exhibits no edema or tenderness.  Neurological: He is alert and oriented to person, place, and time.  Skin: Skin is warm and dry. Capillary refill takes less than 2 seconds. No rash noted.  Psychiatric: He has a normal mood and affect.     ED Treatments / Results  Labs (all labs  ordered are listed, but only abnormal results are displayed) Labs Reviewed  COMPREHENSIVE METABOLIC PANEL - Abnormal; Notable for the following components:      Result Value   Glucose, Bld 137 (*)    Calcium 8.8 (*)    All other components within normal limits  URINALYSIS, ROUTINE W REFLEX MICROSCOPIC - Abnormal; Notable for the following components:   APPearance HAZY (*)    Hgb urine dipstick LARGE (*)    All other components within normal limits  URINALYSIS, MICROSCOPIC (REFLEX) - Abnormal; Notable for the following components:   Bacteria, UA FEW (*)    All other components within normal limits  URINE CULTURE  CBC  LIPASE, BLOOD    EKG None  Radiology Ct Abdomen Pelvis W Contrast  Result Date: 05/03/2018 CLINICAL DATA:  Abdominal distension and bloating over the last several days, worse on the left. EXAM: CT ABDOMEN AND PELVIS WITH CONTRAST TECHNIQUE: Multidetector CT imaging of the abdomen and pelvis was performed using the standard protocol following bolus administration of intravenous contrast. CONTRAST:  169mL ISOVUE-300 IOPAMIDOL (ISOVUE-300) INJECTION 61% COMPARISON:  11/29/2016 FINDINGS: Lower chest: Normal Hepatobiliary: Several small liver cysts, unchanged since the previous exam. No significant liver finding. No calcified gallstones. Pancreas: Normal Spleen: Normal Adrenals/Urinary Tract: Adrenal glands are normal. The left kidney is normal except for a nonobstructing 2 mm stone in the lower pole. Right kidney shows nonobstructing 4 mm stone in the midportion and a tiny cyst. There are a few small stones dependent in the bladder. There is a 2 mm stone in the distal left ureter, apparently not causing any significant obstruction. There is a 2 mm stone either in the region of the right UVJ about to pass into the bladder or within the bladder near the UVJ. Right kidney and ureter do not appear obstructed. Stomach/Bowel: No abnormal bowel finding. No sign of obstruction. No increased  stool burden. No inflammatory change. Vascular/Lymphatic: Aortic atherosclerosis. No aneurysm. IVC is normal. No retroperitoneal adenopathy. Reproductive: Normal Other: No free fluid or air. Bilateral inguinal hernias containing only fat. Musculoskeletal: Lower  lumbar degenerative disc disease and degenerative facet disease. IMPRESSION: Urinary tract stone disease. There are several small stones dependent within the bladder. There is a 2 mm stone in the distal left ureter. There is a 2 mm stone in the region of the right UVJ. These latter findings are interesting in that there does not appear to be a picture of renal obstruction at this time. Nonetheless, the stones could be symptomatic and explain the clinical presentation. There are small nonobstructing stones in each kidney as well. Aortic atherosclerosis. Electronically Signed   By: Nelson Chimes M.D.   On: 05/03/2018 12:41    Procedures Procedures (including critical care time)  Medications Ordered in ED Medications  iopamidol (ISOVUE-300) 61 % injection 100 mL (100 mLs Intravenous Contrast Given 05/03/18 1200)     Initial Impression / Assessment and Plan / ED Course  I have reviewed the triage vital signs and the nursing notes.  Pertinent labs & imaging results that were available during my care of the patient were reviewed by me and considered in my medical decision making (see chart for details).     Given age and first presentation of reported abdominal distention with nausea and vomiting, will obtain CBC, CMP, lipase, CT abdomen and pelvis, urinalysis.  Differential includes diverticulosis, occult mass, constipation, kidney stone.  Fluid overload, ascites, cirrhosis less likely based on no fluid wave on exam no significant abdominal distention today.  Ua with calcium oxalate crystals and Hgb, possible stone etiology, but no pain on exam. Cr WNL, will proceed with CT abd pelv with contrast.   Imaging c/w atypical presentation of L  ureteral stone. Patient relieved by this result, he already has urologist (Dr. Julien Nordmann), and he will call today to schedule a follow up appt with them. States he doesn't drink lots of tea or soft drinks. Gave return precautions for worsening vomiting or abdominal pain.   Final Clinical Impressions(s) / ED Diagnoses   Final diagnoses:  Left ureteral stone    ED Discharge Orders    None     Ralene Ok, MD PGy 2 FM   Sela Hilding, MD 05/03/18 [place], Wenda Overland, MD 05/03/18 1414

## 2018-05-03 NOTE — Discharge Instructions (Addendum)
You have several kidney stones, one in your Left ureter is probably causing the symptoms. Please call your urologist to discuss additional workup. You can use tylenol for pain as needed. Come back if the pain gets worse or you continue to have vomiting.

## 2018-05-03 NOTE — ED Triage Notes (Signed)
Pt reports abd swelling x several days. Denies pain. States he vomited several days ago but none since then. Normal BM today.

## 2018-05-04 ENCOUNTER — Ambulatory Visit: Payer: Medicare Other | Admitting: Family Medicine

## 2018-05-04 LAB — URINE CULTURE

## 2018-08-28 ENCOUNTER — Ambulatory Visit: Payer: Medicare Other | Admitting: Family Medicine

## 2018-09-03 ENCOUNTER — Other Ambulatory Visit: Payer: Self-pay

## 2018-09-03 ENCOUNTER — Encounter: Payer: Self-pay | Admitting: Family Medicine

## 2018-09-03 ENCOUNTER — Ambulatory Visit (INDEPENDENT_AMBULATORY_CARE_PROVIDER_SITE_OTHER): Payer: Medicare Other | Admitting: Family Medicine

## 2018-09-03 VITALS — BP 124/78 | HR 73 | Temp 98.6°F | Ht 72.0 in | Wt 214.8 lb

## 2018-09-03 DIAGNOSIS — E7800 Pure hypercholesterolemia, unspecified: Secondary | ICD-10-CM

## 2018-09-03 DIAGNOSIS — N200 Calculus of kidney: Secondary | ICD-10-CM

## 2018-09-03 DIAGNOSIS — Z0000 Encounter for general adult medical examination without abnormal findings: Secondary | ICD-10-CM | POA: Diagnosis not present

## 2018-09-03 DIAGNOSIS — Z9989 Dependence on other enabling machines and devices: Secondary | ICD-10-CM

## 2018-09-03 DIAGNOSIS — G4733 Obstructive sleep apnea (adult) (pediatric): Secondary | ICD-10-CM

## 2018-09-03 NOTE — Patient Instructions (Signed)
Preventive Care 65 Years and Older, Male Preventive care refers to lifestyle choices and visits with your health care provider that can promote health and wellness. What does preventive care include?  A yearly physical exam. This is also called an annual well check.  Dental exams once or twice a year.  Routine eye exams. Ask your health care provider how often you should have your eyes checked.  Personal lifestyle choices, including: ? Daily care of your teeth and gums. ? Regular physical activity. ? Eating a healthy diet. ? Avoiding tobacco and drug use. ? Limiting alcohol use. ? Practicing safe sex. ? Taking low doses of aspirin every day. ? Taking vitamin and mineral supplements as recommended by your health care provider. What happens during an annual well check? The services and screenings done by your health care provider during your annual well check will depend on your age, overall health, lifestyle risk factors, and family history of disease. Counseling Your health care provider may ask you questions about your:  Alcohol use.  Tobacco use.  Drug use.  Emotional well-being.  Home and relationship well-being.  Sexual activity.  Eating habits.  History of falls.  Memory and ability to understand (cognition).  Work and work environment.  Screening You may have the following tests or measurements:  Height, weight, and BMI.  Blood pressure.  Lipid and cholesterol levels. These may be checked every 5 years, or more frequently if you are over 50 years old.  Skin check.  Lung cancer screening. You may have this screening every year starting at age 55 if you have a 30-pack-year history of smoking and currently smoke or have quit within the past 15 years.  Fecal occult blood test (FOBT) of the stool. You may have this test every year starting at age 50.  Flexible sigmoidoscopy or colonoscopy. You may have a sigmoidoscopy every 5 years or a colonoscopy every 10  years starting at age 50.  Prostate cancer screening. Recommendations will vary depending on your family history and other risks.  Hepatitis C blood test.  Hepatitis B blood test.  Sexually transmitted disease (STD) testing.  Diabetes screening. This is done by checking your blood sugar (glucose) after you have not eaten for a while (fasting). You may have this done every 1-3 years.  Abdominal aortic aneurysm (AAA) screening. You may need this if you are a current or former smoker.  Osteoporosis. You may be screened starting at age 70 if you are at high risk.  Talk with your health care provider about your test results, treatment options, and if necessary, the need for more tests. Vaccines Your health care provider may recommend certain vaccines, such as:  Influenza vaccine. This is recommended every year.  Tetanus, diphtheria, and acellular pertussis (Tdap, Td) vaccine. You may need a Td booster every 10 years.  Varicella vaccine. You may need this if you have not been vaccinated.  Zoster vaccine. You may need this after age 60.  Measles, mumps, and rubella (MMR) vaccine. You may need at least one dose of MMR if you were born in 1957 or later. You may also need a second dose.  Pneumococcal 13-valent conjugate (PCV13) vaccine. One dose is recommended after age 65.  Pneumococcal polysaccharide (PPSV23) vaccine. One dose is recommended after age 65.  Meningococcal vaccine. You may need this if you have certain conditions.  Hepatitis A vaccine. You may need this if you have certain conditions or if you travel or work in places where you   may be exposed to hepatitis A.  Hepatitis B vaccine. You may need this if you have certain conditions or if you travel or work in places where you may be exposed to hepatitis B.  Haemophilus influenzae type b (Hib) vaccine. You may need this if you have certain risk factors.  Talk to your health care provider about which screenings and vaccines  you need and how often you need them. This information is not intended to replace advice given to you by your health care provider. Make sure you discuss any questions you have with your health care provider. Document Released: 11/20/2015 Document Revised: 07/13/2016 Document Reviewed: 08/25/2015 Elsevier Interactive Patient Education  2018 [employer]  

## 2018-09-03 NOTE — Progress Notes (Signed)
Presents today for [employer] Visit-Subsequent.   Date of last exam: 08/22/18  Interpreter used for this visit? no  Patient Care Team: Todd Guys, MD as PCP - General (Family Medicine) Marygrace Drought, MD as Consulting Physician (Ophthalmology) Franchot Gallo, MD as Consulting Physician (Urology)   Other items to address today: none   Cancer Screening: Cervical: n/a Breast: n/a Colon: yes, 2014, no polyps Prostate: yes, by urologist, seen last week , sees for kidney stones and checks PSA, BPH controlled with saw palmetto, nocturia x 1  Uses cpap every night  Other Screening: Last screening for diabetes: June 2019, normal glucose Last lipid screening: has hyperlipidemia, last lipids in April, LDL 66  ADVANCE DIRECTIVES: Discussed: yes Patient desires CPR (Yes ), mechanical ventilation (Yes ), prolonged artificial support (may include mechanical ventilation, tube/PEG feeding, etc) (No ). On File: yes Materials Provided: no  Immunization status:  Immunization History  Administered Date(s) Administered  . Influenza, High Dose Seasonal PF 07/24/2018  . Influenza,inj,Quad PF,6+ Mos 07/15/2013, 07/21/2014, 07/07/2015, 07/06/2016, 08/04/2017  . Pneumococcal Conjugate-13 08/11/2014  . Pneumococcal Polysaccharide-23 03/10/2008, 08/23/2016  . Tdap 06/14/2010, 05/23/2014  . Zoster 11/07/2010  . Zoster Recombinat (Shingrix) 09/11/2017, 02/02/2018     There are no preventive care reminders to display for this patient.   Functional Status Survey: Is the patient deaf or have difficulty hearing?: Yes(wears hearing aides, they work well) Does the patient have difficulty seeing, even when wearing glasses/contacts?: No Does the patient have difficulty concentrating, remembering, or making decisions?: No Does the patient have difficulty walking or climbing stairs?: No Does the patient have difficulty dressing or bathing?: No Does the patient have  difficulty doing errands alone such as visiting a doctor's office or shopping?: No     6CIT Screen 09/03/2018 08/22/2017  What Year? 0 points 0 points  What month? 0 points 0 points  What time? 0 points 0 points  Count back from 20 0 points 0 points  Months in reverse 0 points 0 points  Repeat phrase 0 points 2 points  Total Score 0 2     Patient Active Problem List   Diagnosis Date Noted  . Bladder stone 03/20/2017  . 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 OSA on CPAP 02/21/2017  . Primary osteoarthritis of both hands 03/01/2016  . Pure hypercholesterolemia 03/01/2016  . Essential hypertension, benign 03/01/2016  . Seasonal allergic rhinitis due to pollen 03/01/2016  . BMI 31.0-31.9,adult 03/01/2016  . Obesity 03/01/2016  . Angioedema 02/16/2013     Past Medical History:  Diagnosis Date  . Allergy   . Angioedema   . Arthritis   . 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  [facility].  . Bladder stone   . Cancer ([place])    skin cancer multiple; followed by [facility]matology yearly.  . Cataract    4 YEARS - BILATERAL  . Colon polyps     Previous polyps on colonoscopy.  [facility].    . Hiatal hernia 10/20/2013   EGD confirmed.  [facility].  . Hyperlipemia   . Hypertension    no meds  . Neuromuscular disorder (HCC)    numbness rt hand  . Seasonal allergies   . Sleep apnea    uses CPAP nightly     Past Surgical History:  Procedure Laterality Date  . BLADDER STONE REMOVAL  2011, 2018  . CARPAL TUNNEL RELEASE  01/17/2012   Procedure: CARPAL TUNNEL  RELEASE;  Surgeon: Cammie Sickle., MD;  Location: [place];  Service: Orthopedics;  Laterality: Right;  . [place]  . COLONOSCOPY  10/20/2013   [facility].  Polyps.   . CYSTOSCOPY WITH LITHOLAPAXY N/A 01/09/2017   Procedure: CYSTOSCOPY WITH LITHOLAPAXY;  Surgeon: Franchot Gallo, MD;  Location: [facility];  Service: Urology;   Laterality: N/A;  . ESOPHAGOGASTRODUODENOSCOPY  10/20/2013   HH; biopsy of area concernig for Barrett's; gastritis.  Marland Kitchen EYE SURGERY  2012   both cataracts  . FRACTURE SURGERY     3 fingers  . HOLMIUM LASER APPLICATION N/A 02/06/7061   Procedure: HOLMIUM LASER APPLICATION;  Surgeon: Franchot Gallo, MD;  Location: [facility];  Service: Urology;  Laterality: N/A;  . QUADRICEPS TENDON REPAIR  2003   bilateral  . ULNAR NERVE TRANSPOSITION  01/17/2012   Procedure: ULNAR NERVE DECOMPRESSION/TRANSPOSITION;  Surgeon: Cammie Sickle., MD;  Location: [place];  Service: Orthopedics;  Laterality: Right;  decompression of ulnar nerve only  at right cubital tunnel  . ULNAR NERVE TRANSPOSITION Left 06/01/2017   Procedure: DECOMPRESSION ULNAR NERVE LEFT CUBITAL TUNNEL;  Surgeon: Leanora Cover, MD;  Location: [place];  Service: Orthopedics;  Laterality: Left;  . UPPER GASTROINTESTINAL ENDOSCOPY    . VASECTOMY       Family History  Problem Relation Age of Onset  . Cirrhosis Father   . Hypertension Father   . Alcohol abuse Father   . Heart failure Mother        in 11s  . Arthritis Brother        Rheumatoid arthritis  . Hypertension Brother      Social History   Socioeconomic History  . Marital status: Married    Spouse name: Not on file  . Number of children: 2  . Years of education: 9  . Highest education level: Not on file  Occupational History  . Occupation: Retired  Scientific laboratory technician  . Financial resource strain: Not on file  . Food insecurity:    Worry: Not on file    Inability: Not on file  . Transportation needs:    Medical: Not on file    Non-medical: Not on file  Tobacco Use  . Smoking status: Former Smoker    Packs/day: 2.00    Years: 15.00    Pack years: 30.00    Types: Cigarettes    Last attempt to quit: 01/11/1974    Years since quitting: 44.6  . Smokeless tobacco: Never Used  Substance and Sexual Activity  . Alcohol  use: Yes    Alcohol/week: 1.0 standard drinks    Types: 1 Standard drinks or equivalent per week    Comment: occ  . Drug use: No  . Sexual activity: Never  Lifestyle  . Physical activity:    Days per week: Not on file    Minutes per session: Not on file  . Stress: Not on file  Relationships  . Social connections:    Talks on phone: Not on file    Gets together: Not on file    Attends religious service: Not on file    Active member of club or organization: Not on file    Attends meetings of clubs or organizations: Not on file    Relationship status: Not on file  . Intimate partner violence:    Fear of current or ex partner: Not on file    Emotionally abused: Not  on file    Physically abused: Not on file    Forced sexual activity: Not on file  Other Topics Concern  . Not on file  Social History Narrative   Marital status:  Married x 42 years      Children: 2 daughters; 3 grandchildren.      Employment; retired in 1998 from [employer]; retired age 72 from [employer] after 14 years.      Lives: with wife.      Tobacco: former smoker; quit at age 54.  Smoked 15 years.      Alcohol:  On vacation or special occasions.        Education: [employer].       Exercise: Gym 3-4 times a week for 2 hours.; exercises 7 days per week.   Elliptical for one hour.      Advanced Directives: none; +desires FULL CODE.        ADLs: independent with ADLs; drives.  Does not walk with assistant devices.     Drinks 3-4 caffeine drinks a day      Allergies  Allergen Reactions  . Ace Inhibitors Cough  . Amoxicillin Nausea And Vomiting  . Darvon     Chest pain  . Toprol Xl [Metoprolol Tartrate]      Prior to Admission medications   Medication Sig Start Date End Date Taking? Authorizing Provider  atorvastatin (LIPITOR) 20 MG tablet Take 0.5 tablets (10 mg total) by mouth daily. 02/26/18  Yes Wardell Honour, MD  cetirizine (ZYRTEC) 10 MG tablet Take 1 tablet (10 mg total) by  mouth at bedtime. Patient taking differently: Take 10 mg by mouth daily.  08/23/16  Yes Wardell Honour, MD  Digestive Enzymes (PAPAYA AND ENZYMES PO) Take by mouth.   Yes [provider]  fish oil-omega-3 fatty acids 1000 MG capsule daily. 2000 mg BID   Yes [provider]  fluticasone (FLONASE) 50 MCG/ACT nasal spray Place 2 sprays into both nostrils daily. AS NEEDED 08/23/16  Yes Wardell Honour, MD  GARLIC PO Take 944 mg by mouth 2 (two) times daily.    Yes [provider]  Misc Natural Products (GLUCOSAMINE CHOND COMPLEX/MSM PO) Take by mouth.   Yes [provider]  Multiple Vitamin (MULTIVITAMIN) tablet Take 1 tablet by mouth daily.   Yes [provider]  saw palmetto 160 MG capsule Take 160 mg by mouth 2 (two) times daily.   Yes [provider]     Depression screen [facility] 2/9 02/26/2018 08/28/2017 08/22/2017 08/04/2017 04/12/2017  Decreased Interest 0 0 0 0 0  Down, Depressed, Hopeless 0 0 0 0 0  PHQ - 2 Score 0 0 0 0 0     Fall Risk  02/26/2018 08/28/2017 08/22/2017 08/04/2017 04/12/2017  Falls in the past year? No No No No No  Number falls in past yr: - - - - -  Injury with Fall? - - - - -  Comment - - - - -      PHYSICAL EXAM: BP 124/78 (BP Location: Left Arm, Patient Position: Sitting, Cuff Size: Normal)   Pulse 73   Temp 98.6 F (37 C) (Oral)   Ht 6' (1.829 m)   Wt 214 lb 12.8 oz (97.4 kg)   SpO2 96%   BMI 29.13 kg/m    Wt Readings from Last 3 Encounters:  09/03/18 214 lb 12.8 oz (97.4 kg)  05/03/18 210 lb (95.3 kg)  03/08/18 224 lb (101.6  kg)      Visual Acuity Screening   Right eye Left eye Both eyes  Without correction: 20/20 20/20 20/20   With correction:       BP home 120/70s   Physical Exam   Education/Counseling provided regarding diet and exercise, prevention of chronic diseases, smoking/tobacco cessation, if applicable, and reviewed "Covered Medicare Preventive  Services."   ASSESSMENT/PLAN: 1. Medicare annual wellness visit, subsequent  HCM reviewed/discussed. Anticipatory guidance regarding healthy weight, lifestyle and choices given.   2. Pure hypercholesterolemia Controlled. Continue current regime.   3. Nephrolithiasis Managed by urology  4. OSA on CPAP Managed by neuro, reports nightly compliance    Return in about 6 months (around 03/05/2019) for routine follow-up on chronic medical conditions.

## 2018-09-09 ENCOUNTER — Encounter: Payer: Self-pay | Admitting: Adult Health

## 2018-09-10 ENCOUNTER — Encounter: Payer: Self-pay | Admitting: Adult Health

## 2018-09-10 ENCOUNTER — Ambulatory Visit (INDEPENDENT_AMBULATORY_CARE_PROVIDER_SITE_OTHER): Payer: Medicare Other | Admitting: Adult Health

## 2018-09-10 VITALS — BP 119/71 | HR 80 | Ht 71.0 in | Wt 219.2 lb

## 2018-09-10 DIAGNOSIS — G4733 Obstructive sleep apnea (adult) (pediatric): Secondary | ICD-10-CM

## 2018-09-10 NOTE — Patient Instructions (Signed)
Your Plan:  Continue using BIpap nightly and >4 hours each night If your symptoms worsen or you develop new symptoms please let us know.    Thank you for coming to see [place] at [facility]. I hope we have been able to provide you high quality care today.  You may receive a patient satisfaction survey over the next few weeks. We would appreciate your feedback and comments so that we may continue to improve ourselves and the health of our patients.

## 2018-09-10 NOTE — Progress Notes (Addendum)
PATIENT: Todd [name] DOB: [birth year]  REASON FOR VISIT: follow up HISTORY FROM: patient  HISTORY OF PRESENT ILLNESS: Today 09/10/18:  Todd [name]  is a 77 year old male with a history of sleep apnea.  He returns today for follow-up.  He is currently on BiPAP.  His download indicates that he uses machine 30 out of 30 days for compliance of 100%.  He uses machine greater than 4 hours each night.  On average he uses his machine 7 hours and 59 minutes.  His residual AHI is 15.6 on 18/14 centimeters of water.  He does not have a significant leak.  His AHI has improved from his initial sleep study.  He reports that he continues to tolerate the Bipap well.  He returns today for evaluation.  HISTORY 03/08/2018: I reviewed his BiPAP compliance data from 02/03/2018 through 03/04/2018, which is a total of 30 days, during which time he used his BiPAP every night with percent used days greater than 4 hours at 100%, indicating superb compliance with an average usage of 8 hours and 2 minutes, residual AHI improved at 12.5 per hour, leak low with the 95th percentile at 1.6 L/m on a pressure of 18/14. He reports doing well, he uses a fullface mask. He notes that he likes to sleep on his sides and sometimes he can feel a leak. He feels that he is well rested. He indicates full compliance and no issues with the new settings of the machine and his mask. He is very motivated to continue with treatment. He is working on dietary changes, he has cut out white bread, he is watching his blood pressure, his losartan was discontinued recently and he is monitoring his blood pressure values at home. He had blood work on 02/26/2018 which I reviewed, A1c was 6.2 in the prediabetes range, blood sugar was 107. He has reduced his sugar intake.   REVIEW OF SYSTEMS: Out of a complete 14 system review of symptoms, the patient complains only of the following symptoms, and all other reviewed systems are negative.  See  HPI  ALLERGIES: Allergies  Allergen Reactions  . Ace Inhibitors Cough  . Amoxicillin Nausea And Vomiting  . Darvon     Chest pain  . Toprol Xl [Metoprolol Tartrate]     HOME MEDICATIONS: Outpatient Medications Prior to Visit  Medication Sig Dispense Refill  . atorvastatin (LIPITOR) 20 MG tablet Take 0.5 tablets (10 mg total) by mouth daily. 90 tablet 3  . cetirizine (ZYRTEC) 10 MG tablet Take 1 tablet (10 mg total) by mouth at bedtime. (Patient taking differently: Take 10 mg by mouth daily. ) 90 tablet 3  . Digestive Enzymes (PAPAYA AND ENZYMES PO) Take by mouth.    . fish oil-omega-3 fatty acids 1000 MG capsule daily. 2000 mg BID    . fluticasone (FLONASE) 50 MCG/ACT nasal spray Place 2 sprays into both nostrils daily. AS NEEDED 16 g 11  . GARLIC PO Take 941 mg by mouth 2 (two) times daily.     . Misc Natural Products (GLUCOSAMINE CHOND COMPLEX/MSM PO) Take by mouth.    . Multiple Vitamin (MULTIVITAMIN) tablet Take 1 tablet by mouth daily.    . saw palmetto 160 MG capsule Take 160 mg by mouth 2 (two) times daily.     No facility-administered medications prior to visit.     PAST MEDICAL HISTORY: Past Medical History:  Diagnosis Date  . Allergy   . Angioedema   . Arthritis   .  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  [facility].  . Bladder stone   . Cancer ([place])    skin cancer multiple; followed by [facility]/dermatology yearly.  . Cataract    4 YEARS - BILATERAL  . Colon polyps     Previous polyps on colonoscopy.  [facility]od.    . Hiatal hernia 10/20/2013   EGD confirmed.  [facility].  . Hyperlipemia   . Hypertension    no meds  . Neuromuscular disorder (HCC)    numbness rt hand  . Seasonal allergies   . Sleep apnea    uses CPAP nightly    PAST SURGICAL HISTORY: Past Surgical History:  Procedure Laterality Date  . BLADDER STONE REMOVAL  2011, 2018  . CARPAL TUNNEL RELEASE  01/17/2012   Procedure: CARPAL TUNNEL RELEASE;  Surgeon: Cammie Sickle., MD;  Location: [place];  Service: Orthopedics;  Laterality: Right;  . [place]  . COLONOSCOPY  10/20/2013   [facility].  Polyps.   . CYSTOSCOPY WITH LITHOLAPAXY N/A 01/09/2017   Procedure: CYSTOSCOPY WITH LITHOLAPAXY;  Surgeon: Franchot Gallo, MD;  Location: [facility];  Service: Urology;  Laterality: N/A;  . ESOPHAGOGASTRODUODENOSCOPY  10/20/2013   HH; biopsy of area concernig for Barrett's; gastritis.  Marland Kitchen EYE SURGERY  2012   both cataracts  . FRACTURE SURGERY     3 fingers  . HOLMIUM LASER APPLICATION N/A 05/12/7340   Procedure: HOLMIUM LASER APPLICATION;  Surgeon: Franchot Gallo, MD;  Location[facility];  Service: Urology;  Laterality: N/A;  . QUADRICEPS TENDON REPAIR  2003   bilateral  . ULNAR NERVE TRANSPOSITION  01/17/2012   Procedure: ULNAR NERVE DECOMPRESSION/TRANSPOSITION;  Surgeon: Cammie Sickle., MD;  Location: [place];  Service: Orthopedics;  Laterality: Right;  decompression of ulnar nerve only  at right cubital tunnel  . ULNAR NERVE TRANSPOSITION Left 06/01/2017   Procedure: DECOMPRESSION ULNAR NERVE LEFT CUBITAL TUNNEL;  Surgeon: Leanora Cover, MD;  Location: [place];  Service: Orthopedics;  Laterality: Left;  . UPPER GASTROINTESTINAL ENDOSCOPY    . VASECTOMY      FAMILY HISTORY: Family History  Problem Relation Age of Onset  . Cirrhosis Father   . Hypertension Father   . Alcohol abuse Father   . Heart failure Mother        in 10s  . Arthritis Brother        Rheumatoid arthritis  . Hypertension Brother     SOCIAL HISTORY: Social History   Socioeconomic History  . Marital status: Married    Spouse name: Not on file  . Number of children: 2  . Years of education: 35  . Highest education level: Not on file  Occupational History  . Occupation: Retired  Scientific laboratory technician  . Financial resource strain: Not on file  . Food insecurity:    Worry: Not on file    Inability: Not on file  .  Transportation needs:    Medical: Not on file    Non-medical: Not on file  Tobacco Use  . Smoking status: Former Smoker    Packs/day: 2.00    Years: 15.00    Pack years: 30.00    Types: Cigarettes    Last attempt to quit: 01/11/1974    Years since quitting: 44.6  . Smokeless tobacco: Never Used  Substance and Sexual Activity  . Alcohol use: Yes    Alcohol/week: 1.0 standard drinks  Types: 1 Standard drinks or equivalent per week    Comment: occ  . Drug use: No  . Sexual activity: Never  Lifestyle  . Physical activity:    Days per week: Not on file    Minutes per session: Not on file  . Stress: Not on file  Relationships  . Social connections:    Talks on phone: Not on file    Gets together: Not on file    Attends religious service: Not on file    Active member of club or organization: Not on file    Attends meetings of clubs or organizations: Not on file    Relationship status: Not on file  . Intimate partner violence:    Fear of current or ex partner: Not on file    Emotionally abused: Not on file    Physically abused: Not on file    Forced sexual activity: Not on file  Other Topics Concern  . Not on file  Social History Narrative   Marital status:  Married x 42 years      Children: 2 daughters; 3 grandchildren.      Employment; retired in 1998 from [employer]; retired age 54 from [employer] after 14 years.      Lives: with wife.      Tobacco: former smoker; quit at age 58.  Smoked 15 years.      Alcohol:  On vacation or special occasions.        Education: [employer].       Exercise: Gym 3-4 times a week for 2 hours.; exercises 7 days per week.   Elliptical for one hour.      Advanced Directives: none; +desires FULL CODE.        ADLs: independent with ADLs; drives.  Does not walk with assistant devices.     Drinks 3-4 caffeine drinks a day       PHYSICAL EXAM  Vitals:   09/10/18 0823  BP: 119/71  Pulse: 80  Weight: 219 lb 3.2 oz (99.4  kg)  Height: 5\' 11"  (1.803 m)   Body mass index is 30.57 kg/m.  Generalized: Well developed, in no acute distress   Neurological examination  Mentation: Alert oriented to time, place, history taking. Follows all commands speech and language fluent Cranial nerve II-XII:  Extraocular movements were full, visual field were full on confrontational test. Facial sensation and strength were normal. Uvula tongue midline. Head turning and shoulder shrug  were normal and symmetric.  Neck circumference 18 inches, Mallampati 3+ Motor: The motor testing reveals 5 over 5 strength of all 4 extremities. Good symmetric motor tone is noted throughout.  Sensory: Sensory testing is intact to soft touch on all 4 extremities. No evidence of extinction is noted.  Coordination: Cerebellar testing reveals good finger-nose-finger and heel-to-shin bilaterally.  Gait and station: Gait is normal.   DIAGNOSTIC DATA (LABS, IMAGING, TESTING) - I reviewed patient records, labs, notes, testing and imaging myself where available.  Lab Results  Component Value Date   WBC 6.4 05/03/2018   HGB 14.4 05/03/2018   HCT 41.2 05/03/2018   MCV 83.9 05/03/2018   PLT 226 05/03/2018      Component Value Date/Time   NA 138 05/03/2018 1054   NA 138 02/26/2018 1013   K 3.5 05/03/2018 1054   CL 103 05/03/2018 1054   CO2 27 05/03/2018 1054   GLUCOSE 137 (H) 05/03/2018 1054   BUN 21 05/03/2018 1054   BUN 14  02/26/2018 1013   CREATININE 0.98 05/03/2018 1054   CREATININE 0.89 08/23/2016 1016   CALCIUM 8.8 (L) 05/03/2018 1054   PROT 7.3 05/03/2018 1054   PROT 7.0 02/26/2018 1013   ALBUMIN 4.1 05/03/2018 1054   ALBUMIN 4.6 02/26/2018 1013   AST 28 05/03/2018 1054   ALT 25 05/03/2018 1054   ALKPHOS 76 05/03/2018 1054   BILITOT 0.7 05/03/2018 1054   BILITOT 0.7 02/26/2018 1013   GFRNONAA >60 05/03/2018 1054   GFRAA >60 05/03/2018 1054   Lab Results  Component Value Date   CHOL 136 02/26/2018   HDL 42 02/26/2018    LDLCALC 66 02/26/2018   TRIG 141 02/26/2018   CHOLHDL 3.2 02/26/2018   Lab Results  Component Value Date   HGBA1C 6.2 (H) 02/26/2018   No results found for: VITAMINB12 Lab Results  Component Value Date   TSH 2.100 02/26/2018      ASSESSMENT AND PLAN 78 y.o. year old male  has a past medical history of Allergy, Angioedema, Arthritis, Barrett esophagus (11/07/2008), Bladder stone, Cancer ([place]), Cataract, Colon polyps, Hiatal hernia (10/20/2013), Hyperlipemia, Hypertension, Neuromuscular disorder ([place]), Seasonal allergies, and Sleep apnea. here with:  1.  Obstructive sleep apnea on Bipap  The patient download shows that he has excellent compliance.  His residual AHI has remained slightly elevated but much improved from his initial sleep study.  I discussed with Dr. Rexene Alberts he will continue on his current pressure.  Advised that if his symptoms worsen or he develops new symptoms he should let us know.  He will follow-up in 6 months or sooner if needed.   I spent 15 minutes with the patient. 50% of this time was spent reviewing download   Ward Givens, MSN, NP-C 09/10/2018, 8:42 AM [facility] [address], [place], [place] [address] [phone]  I reviewed the above note and documentation by the Nurse Practitioner and agree with the history, physical exam, assessment and plan as outlined above. I was immediately available for face-to-face consultation. Star Age, MD, PhD [facility])

## 2019-01-21 ENCOUNTER — Encounter: Payer: Self-pay | Admitting: Family Medicine

## 2019-02-04 NOTE — Progress Notes (Signed)
Neg for barretts metaplasia, neg for esinophilic,esophagitis  Hyperplastic polyps (2)

## 2019-03-07 ENCOUNTER — Telehealth (INDEPENDENT_AMBULATORY_CARE_PROVIDER_SITE_OTHER): Payer: Medicare Other | Admitting: Family Medicine

## 2019-03-07 ENCOUNTER — Other Ambulatory Visit: Payer: Self-pay

## 2019-03-07 DIAGNOSIS — E782 Mixed hyperlipidemia: Secondary | ICD-10-CM | POA: Diagnosis not present

## 2019-03-07 DIAGNOSIS — M545 Low back pain, unspecified: Secondary | ICD-10-CM

## 2019-03-07 DIAGNOSIS — R7303 Prediabetes: Secondary | ICD-10-CM

## 2019-03-07 MED ORDER — CYCLOBENZAPRINE HCL 10 MG PO TABS: 10.0000 mg | ORAL_TABLET | Freq: Three times a day (TID) | ORAL | 0 refills | Status: DC | PRN

## 2019-03-07 NOTE — Progress Notes (Signed)
Virtual Visit Note  I connected with patient on 03/07/19 at 822am by phone and verified that I am speaking with the correct person using two identifiers. Todd [name] is currently located at home and patient is currently with them during visit. The provider, Rutherford Guys, MD is located in their office at time of visit.  I discussed the limitations, risks, security and privacy concerns of performing an evaluation and management service by telephone and the availability of in person appointments. I also discussed with the patient that there may be a patient responsible charge related to this service. The patient expressed understanding and agreed to proceed.   CC: routine followup  HPI ? 78yo M with PMH of HLP, OSA on bipap and nephrolithiasis  Last OV Oct 2019 Saw sleep in nov 2019  Overall he has been doing well Has lost weight intentionally, 209 lb today Checks BP avg 127/74  Wt Readings from Last 3 Encounters:  09/10/18 219 lb 3.2 oz (99.4 kg)  09/03/18 214 lb 12.8 oz (97.4 kg)  05/03/18 210 lb (95.3 kg)    Injured his back and right hip while exercising, about 4 days ago Tried naproxen but raised BP Has been stretching Today better, but still present Requesting rx for muscle relaxant, has had in the past wo issues  Stopped taking atorvastatin 43m completely a month ago as he was having myalgias and cramping of hands All his symptoms have resolved wo dc atorvastatin He continues to take fish oil 4000 EPA a day Allergic to shrimp  Quit smoking at age [age]Does not drink etoh  Had colonoscopy and EGD March 2020 - diverticulosis, small non precancerous polyps, EGD bx no barretts  Lab Results  Component Value Date   CHOL 136 02/26/2018   HDL 42 02/26/2018   LDLCALC 66 02/26/2018   TRIG 141 02/26/2018   CHOLHDL 3.2 02/26/2018   BP Readings from Last 3 Encounters:  09/10/18 119/71  09/03/18 124/78  05/03/18 120/67    Allergies  Allergen Reactions  . Ace  Inhibitors Cough  . Amoxicillin Nausea And Vomiting  . Darvon     Chest pain  . Toprol Xl [Metoprolol Tartrate]     Prior to Admission medications   Medication Sig Start Date End Date Taking? Authorizing Provider  atorvastatin (LIPITOR) 20 MG tablet Take 0.5 tablets (10 mg total) by mouth daily. 02/26/18   SWardell Honour MD  cetirizine (ZYRTEC) 10 MG tablet Take 1 tablet (10 mg total) by mouth at bedtime. Patient taking differently: Take 10 mg by mouth daily.  08/23/16   SWardell Honour MD  Digestive Enzymes (PAPAYA AND ENZYMES PO) Take by mouth.    [provider]  fish oil-omega-3 fatty acids 1000 MG capsule daily. 2000 mg BID    [provider]  fluticasone (FLONASE) 50 MCG/ACT nasal spray Place 2 sprays into both nostrils daily. AS NEEDED 08/23/16   SWardell Honour MD  GARLIC PO Take 5702mg by mouth 2 (two) times daily.     [provider]  Misc Natural Products (GLUCOSAMINE CHOND COMPLEX/MSM PO) Take by mouth.    [provider]  Multiple Vitamin (MULTIVITAMIN) tablet Take 1 tablet by mouth daily.    [provider]  saw palmetto 160 MG capsule Take 160 mg by mouth 2 (two) times daily.    [provider]    Past Medical History:  Diagnosis Date  . Allergy   . Angioedema   .  Arthritis   . 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  [facility].  . Bladder stone   . Cancer ([place])    skin cancer multiple; followed by [facility]ology yearly.  . Cataract    4 YEARS - BILATERAL  . Colon polyps     Previous polyps on colonoscopy.  [facility].    . Hiatal hernia 10/20/2013   EGD confirmed.  [facility].  . Hyperlipemia   . Hypertension    no meds  . Neuromuscular disorder (HCC)    numbness rt hand  . Seasonal allergies   . Sleep apnea    uses CPAP nightly    Past Surgical History:  Procedure Laterality Date  . BLADDER STONE REMOVAL  2011, 2018  . CARPAL TUNNEL RELEASE  01/17/2012   Procedure: CARPAL TUNNEL RELEASE;  Surgeon:  Cammie Sickle., MD;  Location: [place];  Service: Orthopedics;  Laterality: Right;  . [place]  . COLONOSCOPY  10/20/2013   [facility].  Polyps.   . CYSTOSCOPY WITH LITHOLAPAXY N/A 01/09/2017   Procedure: CYSTOSCOPY WITH LITHOLAPAXY;  Surgeon: Franchot Gallo, MD;  [facility];  Service: Urology;  Laterality: N/A;  . ESOPHAGOGASTRODUODENOSCOPY  10/20/2013   HH; biopsy of area concernig for Barrett's; gastritis.  Marland Kitchen EYE SURGERY  2012   both cataracts  . FRACTURE SURGERY     3 fingers  . HOLMIUM LASER APPLICATION N/A 04/15/4853   Procedure: HOLMIUM LASER APPLICATION;  Surgeon: Franchot Gallo, MD;  Location[facility];  Service: Urology;  Laterality: N/A;  . QUADRICEPS TENDON REPAIR  2003   bilateral  . ULNAR NERVE TRANSPOSITION  01/17/2012   Procedure: ULNAR NERVE DECOMPRESSION/TRANSPOSITION;  Surgeon: Cammie Sickle., MD;  Location: [place];  Service: Orthopedics;  Laterality: Right;  decompression of ulnar nerve only  at right cubital tunnel  . ULNAR NERVE TRANSPOSITION Left 06/01/2017   Procedure: DECOMPRESSION ULNAR NERVE LEFT CUBITAL TUNNEL;  Surgeon: Leanora Cover, MD;  Location: [place];  Service: Orthopedics;  Laterality: Left;  . UPPER GASTROINTESTINAL ENDOSCOPY    . VASECTOMY      Social History   Tobacco Use  . Smoking status: Former Smoker    Packs/day: 2.00    Years: 15.00    Pack years: 30.00    Types: Cigarettes    Last attempt to quit: 01/11/1974    Years since quitting: 45.1  . Smokeless tobacco: Never Used  Substance Use Topics  . Alcohol use: Yes    Alcohol/week: 1.0 standard drinks    Types: 1 Standard drinks or equivalent per week    Comment: occ    Family History  Problem Relation Age of Onset  . Cirrhosis Father   . Hypertension Father   . Alcohol abuse Father   . Heart failure Mother        in 47s  . Arthritis Brother        Rheumatoid  arthritis  . Hypertension Brother     ROS Per hpi  Objective  Vitals as reported by the patient: per above   ASSESSMENT and PLAN  1. Mixed hypercholesterolemia Off medications due to myalgias. Cont fish oil. Working on [employer]. Checking labs, consider low dose pravastatin or crestor if needed. - Lipid panel; Future - TSH; Future - CMP14+EGFR; Future  2. Acute right-sided low back pain without sciatica Discussed supportive measures, new meds r/se/b and RTC precautions.  Other orders - cyclobenzaprine (FLEXERIL)  10 MG tablet; Take 1 tablet (10 mg total) by mouth 3 (three) times daily as needed for muscle spasms.  FOLLOW-UP: 6 months   The above assessment and management plan was discussed with the patient. The patient verbalized understanding of and has agreed to the management plan. Patient is aware to call the clinic if symptoms persist or worsen. Patient is aware when to return to the clinic for a follow-up visit. Patient educated on when it is appropriate to go to the emergency department.    I provided 22 minutes of non-face-to-face time during this encounter.  Rutherford Guys, MD Primary Care at [facility], [place] [address] Ph.  [phone] Fax [phone]

## 2019-03-07 NOTE — Progress Notes (Signed)
Follow up on chronic conditions, has not taken lipitor since the beginning of April causes pain in the back of the legs that radiate up the buttucks. Also, hands tend to cramp up. This has happened before  And he was told to break the medication in half. During exercise this wk, thinks he may have bruised his hips. He is asking for a muscle relaxer.

## 2019-03-07 NOTE — Progress Notes (Signed)
Upon chart review after closing note  Lab Results  Component Value Date   HGBA1C 6.2 (H) 02/26/2018   Rechecking. Patient has been working on LFM.

## 2019-03-07 NOTE — Addendum Note (Signed)
Addended by: Rutherford Guys on: 03/07/2019 08:50 AM   Modules accepted: Orders

## 2019-03-18 ENCOUNTER — Other Ambulatory Visit: Payer: Self-pay

## 2019-03-18 ENCOUNTER — Ambulatory Visit: Payer: Medicare Other

## 2019-03-18 DIAGNOSIS — E782 Mixed hyperlipidemia: Secondary | ICD-10-CM

## 2019-03-18 DIAGNOSIS — R7303 Prediabetes: Secondary | ICD-10-CM

## 2019-03-19 LAB — CMP14+EGFR
ALT: 28 IU/L (ref 0–44)
AST: 26 IU/L (ref 0–40)
Albumin/Globulin Ratio: 2 (ref 1.2–2.2)
Albumin: 4.8 g/dL — ABNORMAL HIGH (ref 3.7–4.7)
Alkaline Phosphatase: 75 IU/L (ref 39–117)
BUN/Creatinine Ratio: 15 (ref 10–24)
BUN: 16 mg/dL (ref 8–27)
Bilirubin Total: 0.8 mg/dL (ref 0.0–1.2)
CO2: 22 mmol/L (ref 20–29)
Calcium: 9.5 mg/dL (ref 8.6–10.2)
Chloride: 100 mmol/L (ref 96–106)
Creatinine, Ser: 1.07 mg/dL (ref 0.76–1.27)
GFR calc Af Amer: 78 mL/min/{1.73_m2} (ref >59)
GFR calc non Af Amer: 67 mL/min/{1.73_m2} (ref >59)
Globulin, Total: 2.4 g/dL (ref 1.5–4.5)
Glucose: 107 mg/dL — ABNORMAL HIGH (ref 65–99)
Potassium: 4.3 mmol/L (ref 3.5–5.2)
Sodium: 136 mmol/L (ref 134–144)
Total Protein: 7.2 g/dL (ref 6.0–8.5)

## 2019-03-19 LAB — HEMOGLOBIN A1C
Est. average glucose Bld gHb Est-mCnc: 120 mg/dL
Hgb A1c MFr Bld: 5.8 % — ABNORMAL HIGH (ref 4.8–5.6)

## 2019-03-19 LAB — LIPID PANEL
Chol/HDL Ratio: 4.5 ratio (ref 0.0–5.0)
Cholesterol, Total: 190 mg/dL (ref 100–199)
HDL: 42 mg/dL (ref >39)
LDL Calculated: 118 mg/dL — ABNORMAL HIGH (ref 0–99)
Triglycerides: 149 mg/dL (ref 0–149)
VLDL Cholesterol Cal: 30 mg/dL (ref 5–40)

## 2019-03-19 LAB — TSH: TSH: 1.2 u[IU]/mL (ref 0.450–4.500)

## 2019-04-03 ENCOUNTER — Encounter: Payer: Self-pay | Admitting: Family Medicine

## 2019-04-03 ENCOUNTER — Other Ambulatory Visit: Payer: Self-pay

## 2019-04-03 ENCOUNTER — Ambulatory Visit (INDEPENDENT_AMBULATORY_CARE_PROVIDER_SITE_OTHER): Payer: Medicare Other | Admitting: Family Medicine

## 2019-04-03 VITALS — Temp 97.8°F | Ht 71.0 in | Wt 210.0 lb

## 2019-04-03 DIAGNOSIS — K5909 Other constipation: Secondary | ICD-10-CM | POA: Diagnosis not present

## 2019-04-03 DIAGNOSIS — M7631 Iliotibial band syndrome, right leg: Secondary | ICD-10-CM | POA: Diagnosis not present

## 2019-04-03 DIAGNOSIS — M549 Dorsalgia, unspecified: Secondary | ICD-10-CM | POA: Diagnosis not present

## 2019-04-03 LAB — POCT URINALYSIS DIP (MANUAL ENTRY)
Bilirubin, UA: NEGATIVE
Blood, UA: NEGATIVE
Glucose, UA: NEGATIVE mg/dL
Ketones, POC UA: NEGATIVE mg/dL
Leukocytes, UA: NEGATIVE
Nitrite, UA: NEGATIVE
Protein Ur, POC: NEGATIVE mg/dL
Spec Grav, UA: 1.015 (ref 1.010–1.025)
Urobilinogen, UA: 0.2 E.U./dL
pH, UA: 7.5 (ref 5.0–8.0)

## 2019-04-03 NOTE — Patient Instructions (Addendum)
    For constipation   Make sure you are drinking enough water daily. Make sure you are getting enough fiber in your diet - this will make you regular - you can eat high fiber foods or use metamucil as a supplement - it is really important to drink enough water when using fiber supplements.  If your stools are hard or are formed balls or you have to strain a stool softener will help - use colace 2-3 capsule a day  For gentle treatment of constipation Use Miralax 1-2 capfuls a day until your stools are soft and regular and then decrease the usage - you can use this daily   If you have lab work done today you will be contacted with your lab results within the next 2 weeks.  If you have not heard from [place] then please contact us. The fastest way to get your results is to register for My Chart.   IF you received an x-ray today, you will receive an invoice from [facility]. Please contact [facility] at [phone] with questions or concerns regarding your invoice.   IF you received labwork today, you will receive an invoice from [place]. Please contact [facility] at [phone] with questions or concerns regarding your invoice.   Our billing staff will not be able to assist you with questions regarding bills from these companies.  You will be contacted with the lab results as soon as they are available. The fastest way to get your results is to activate your My Chart account. Instructions are located on the last page of this paperwork. If you have not heard from [place] regarding the results in 2 weeks, please contact this office.

## 2019-04-03 NOTE — Progress Notes (Signed)
5/27/20203:30 PM  Todd [name] [birth year], 78 y.o., male [id number]  Chief Complaint  Patient presents with   Back Pain    from heavy lifting, every since colonoscopy he has been constipated. Taking stool softer for about 1 wk, and using fiber. Has been having some bowel movements but not normal. Flexeril is not working for the pain. Back pain is not causing limit in movement only when he sits for a long time    HPI:   Patient is a 78 y.o. male who presents today for back pain  Back pain for about a month Pain at belt line, mostly right side Right hip pain, radiates down thigh Back pain not present with movement Able to do his core exercises Pain present when he sits for too long or leans on towards the right Flexeril not helping  Had ege/colonscopy in march Benign path Stopped taking fiber supplement Last BM this morning, rabbit pellets, patient is straining Took miralax last night Drinks plenty of water No fever or chills No black tarry stools or bright red blood No nausea or vomiting  Fall Risk  04/03/2019 03/07/2019 02/26/2018 08/28/2017 08/22/2017  Falls in the past year? 0 0 No No No  Number falls in past yr: 0 0 - - -  Injury with Fall? 1 0 - - -  Comment - - - - -     Depression screen [facility] 2/9 04/03/2019 03/07/2019 02/26/2018  Decreased Interest 0 0 0  Down, Depressed, Hopeless 0 0 0  PHQ - 2 Score 0 0 0    Allergies  Allergen Reactions   Ace Inhibitors Cough   Amoxicillin Nausea And Vomiting   Darvon     Chest pain   Toprol Xl [Metoprolol Tartrate]     Prior to Admission medications   Medication Sig Start Date End Date Taking? Authorizing Provider  cetirizine (ZYRTEC) 10 MG tablet Take 1 tablet (10 mg total) by mouth at bedtime. Patient taking differently: Take 10 mg by mouth daily.  08/23/16  Yes Wardell Honour, MD  Digestive Enzymes (PAPAYA AND ENZYMES PO) Take by mouth.   Yes [provider]  fish oil-omega-3 fatty acids 1000 MG  capsule daily. 2000 mg BID   Yes [provider]  fluticasone (FLONASE) 50 MCG/ACT nasal spray Place 2 sprays into both nostrils daily. AS NEEDED 08/23/16  Yes Wardell Honour, MD  GARLIC PO Take 174 mg by mouth 2 (two) times daily.    Yes [provider]  Misc Natural Products (GLUCOSAMINE CHOND COMPLEX/MSM PO) Take by mouth.   Yes [provider]  Multiple Vitamin (MULTIVITAMIN) tablet Take 1 tablet by mouth daily.   Yes [provider]  saw palmetto 160 MG capsule Take 160 mg by mouth 2 (two) times daily.   Yes [provider]  cyclobenzaprine (FLEXERIL) 10 MG tablet Take 1 tablet (10 mg total) by mouth 3 (three) times daily as needed for muscle spasms. Patient not taking: Reported on 04/03/2019 03/07/19   Rutherford Guys, MD  Garlic 081 MG CAPS Take by mouth.    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  [facility].   Bladder stone    Cancer ([place])    skin cancer multiple; followed by [facility]matology yearly.   Cataract    4 YEARS - BILATERAL  Colon polyps     Previous polyps on colonoscopy.  [facility].     Hiatal hernia 10/20/2013   EGD confirmed.  [facility].   Hyperlipemia    Hypertension    no meds   Neuromuscular disorder (HCC)    numbness rt hand   Seasonal allergies    Sleep apnea    uses CPAP nightly    Past Surgical History:  Procedure Laterality Date   BLADDER STONE REMOVAL  2011, 2018   CARPAL TUNNEL RELEASE  01/17/2012   Procedure: CARPAL TUNNEL RELEASE;  Surgeon: Cammie Sickle., MD;  Location: [place];  Service: Orthopedics;  Laterality: Right;   CERVICAL FUSION  1976   COLONOSCOPY  10/20/2013   [facility].  Polyps.    CYSTOSCOPY WITH LITHOLAPAXY N/A 01/09/2017   Procedure: CYSTOSCOPY WITH LITHOLAPAXY;  Surgeon: Franchot Gallo, MD;  Location: [facility];  Service:  Urology;  Laterality: N/A;   ESOPHAGOGASTRODUODENOSCOPY  10/20/2013   HH; biopsy of area concernig for Barrett's; gastritis.   EYE SURGERY  2012   both cataracts   FRACTURE SURGERY     3 fingers   HOLMIUM LASER APPLICATION N/A 0/01/5596   Procedure: HOLMIUM LASER APPLICATION;  Surgeon: Franchot Gallo, MD;  [facility];  Service: Urology;  Laterality: N/A;   QUADRICEPS TENDON REPAIR  2003   bilateral   ULNAR NERVE TRANSPOSITION  01/17/2012   Procedure: ULNAR NERVE DECOMPRESSION/TRANSPOSITION;  Surgeon: Cammie Sickle., MD;  Location: [place];  Service: Orthopedics;  Laterality: Right;  decompression of ulnar nerve only  at right cubital tunnel   ULNAR NERVE TRANSPOSITION Left 06/01/2017   Procedure: DECOMPRESSION ULNAR NERVE LEFT CUBITAL TUNNEL;  Surgeon: Leanora Cover, MD;  Location: [place];  Service: Orthopedics;  Laterality: Left;   UPPER GASTROINTESTINAL ENDOSCOPY     VASECTOMY      Social History   Tobacco Use   Smoking status: Former Smoker    Packs/day: 2.00    Years: 15.00    Pack years: 30.00    Types: Cigarettes    Last attempt to quit: 01/11/1974    Years since quitting: 45.2   Smokeless tobacco: Never Used  Substance Use Topics   Alcohol use: Yes    Alcohol/week: 1.0 standard drinks    Types: 1 Standard drinks or equivalent per week    Comment: occ    Family History  Problem Relation Age of Onset   Cirrhosis Father    Hypertension Father    Alcohol abuse Father    Heart failure Mother        in 61s   Arthritis Brother        Rheumatoid arthritis   Hypertension Brother     ROS Per hpi  OBJECTIVE:  Today's Vitals   04/03/19 1511  Temp: 97.8 F (36.6 C)  TempSrc: Oral  SpO2: 98%  Weight: 210 lb (95.3 kg)  Height: 5\' 11"  (1.803 m)   Body mass index is 29.29 kg/m.   Physical Exam Vitals signs and nursing note reviewed.  Constitutional:      Appearance: He is  well-developed.  HENT:     Head: Normocephalic and atraumatic.  Eyes:     Conjunctiva/sclera: Conjunctivae normal.     Pupils: Pupils are equal, round, and reactive to light.  Neck:     Musculoskeletal: Neck supple.  Pulmonary:     Effort: Pulmonary effort is normal.  Musculoskeletal:     Right  hip: He exhibits tenderness (IT band). He exhibits normal range of motion, normal strength and no bony tenderness.     Lumbar back: He exhibits normal range of motion, no tenderness and no bony tenderness.     Comments: Neg SLR Neg [place]  Skin:    General: Skin is warm and dry.  Neurological:     Mental Status: He is alert and oriented to person, place, and time.       Results for orders placed or performed in visit on 04/03/19 (from the past 24 hour(s))  POCT urinalysis dipstick     Status: None   Collection Time: 04/03/19  3:45 PM  Result Value Ref Range   Color, UA yellow yellow   Clarity, UA clear clear   Glucose, UA negative negative mg/dL   Bilirubin, UA negative negative   Ketones, POC UA negative negative mg/dL   Spec Grav, UA 1.015 1.010 - 1.025   Blood, UA negative negative   pH, UA 7.5 5.0 - 8.0   Protein Ur, POC negative negative mg/dL   Urobilinogen, UA 0.2 0.2 or 1.0 E.U./dL   Nitrite, UA Negative Negative   Leukocytes, UA Negative Negative    No results found.   ASSESSMENT and PLAN  1. Back pain, unspecified back location, unspecified back pain laterality, unspecified chronicity 2. Iliotibial band syndrome of right side Discussed supportive measures, use OTC ibuprofen and RTC precautions. Patient educational handout given. - POCT urinalysis dipstick  3. Other constipation Discussed metamucil and miralax.  Return if symptoms worsen or fail to improve.    Rutherford Guys, MD Primary Care at [facility] [place], [place] [address] Ph.  [phone] Fax [phone]

## 2019-09-02 ENCOUNTER — Encounter: Payer: Self-pay | Admitting: Family Medicine

## 2019-09-02 ENCOUNTER — Ambulatory Visit (INDEPENDENT_AMBULATORY_CARE_PROVIDER_SITE_OTHER): Payer: Medicare Other | Admitting: Family Medicine

## 2019-09-02 ENCOUNTER — Other Ambulatory Visit: Payer: Self-pay

## 2019-09-02 VITALS — BP 130/70 | HR 78 | Temp 97.7°F | Ht 71.0 in | Wt 216.8 lb

## 2019-09-02 DIAGNOSIS — R7303 Prediabetes: Secondary | ICD-10-CM

## 2019-09-02 DIAGNOSIS — Z9989 Dependence on other enabling machines and devices: Secondary | ICD-10-CM | POA: Diagnosis not present

## 2019-09-02 DIAGNOSIS — G4733 Obstructive sleep apnea (adult) (pediatric): Secondary | ICD-10-CM

## 2019-09-02 DIAGNOSIS — E782 Mixed hyperlipidemia: Secondary | ICD-10-CM

## 2019-09-02 NOTE — Progress Notes (Signed)
10/26/20208:12 AM  Todd [name] 29-May-1941, 78 y.o., male [id number]  Chief Complaint  Patient presents with  . chronic conditions    6 m f.u     HPI:   Patient is a 78 y.o. male with past medical history significant for HLP, prediabetes, BPH, OSA on cpap who presents today for routine followup  Last OV May 2020 Doing well no acute concerns Sees urology next month - they check PSA, nocturia 1-2 x night Uses cpap every night exercises every day Tries to eat healthy Had flu vaccine at [facility] this season   Lab Results  Component Value Date   HGBA1C 5.8 (H) 03/18/2019   Lab Results  Component Value Date   CREATININE 1.07 03/18/2019   BUN 16 03/18/2019   NA 136 03/18/2019   K 4.3 03/18/2019   CL 100 03/18/2019   CO2 22 03/18/2019    Lab Results  Component Value Date   CHOL 190 03/18/2019   HDL 42 03/18/2019   LDLCALC 118 (H) 03/18/2019   TRIG 149 03/18/2019   CHOLHDL 4.5 03/18/2019    Depression screen PHQ 2/9 09/02/2019 04/03/2019 03/07/2019  Decreased Interest 0 0 0  Down, Depressed, Hopeless 0 0 0  PHQ - 2 Score 0 0 0    Fall Risk  09/02/2019 04/03/2019 03/07/2019 02/26/2018 08/28/2017  Falls in the past year? 0 0 0 No No  Number falls in past yr: 0 0 0 - -  Injury with Fall? 0 1 0 - -  Comment - - - - -  Follow up Falls evaluation completed - - - -     Allergies  Allergen Reactions  . Ace Inhibitors Cough  . Amoxicillin Nausea And Vomiting  . Darvon     Chest pain  . Toprol Xl [Metoprolol Tartrate]     Prior to Admission medications   Medication Sig Start Date End Date Taking? Authorizing Provider  Digestive Enzymes (PAPAYA AND ENZYMES PO) Take by mouth.   Yes [provider]  fish oil-omega-3 fatty acids 1000 MG capsule daily. 2000 mg BID   Yes [provider]  fluticasone (FLONASE) 50 MCG/ACT nasal spray Place 2 sprays into both nostrils daily. AS NEEDED 08/23/16  Yes Wardell Honour, MD  Garlic [id number] MG CAPS Take by mouth.   Yes  [provider]  GARLIC PO Take [id number] mg by mouth 2 (two) times daily.    Yes [provider]  Misc Natural Products (GLUCOSAMINE CHOND COMPLEX/MSM PO) Take by mouth.   Yes [provider]  Multiple Vitamin (MULTIVITAMIN) tablet Take 1 tablet by mouth daily.   Yes [provider]  saw palmetto 160 MG capsule Take 160 mg by mouth 2 (two) times daily.   Yes [provider]  cetirizine (ZYRTEC) 10 MG tablet Take 1 tablet (10 mg total) by mouth at bedtime. Patient not taking: Reported on 09/02/2019 08/23/16   Wardell Honour, MD  cyclobenzaprine (FLEXERIL) 10 MG tablet Take 1 tablet (10 mg total) by mouth 3 (three) times daily as needed for muscle spasms. Patient not taking: Reported on 04/03/2019 03/07/19   Rutherford Guys, MD    Past Medical History:  Diagnosis Date  . Allergy   . Angioedema   . Arthritis   . 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  [facility].  . Bladder stone   . Cancer ([place])    skin cancer multiple; followed by [facility]ology yearly.  . Cataract  4 YEARS - BILATERAL  . Colon polyps     Previous polyps on colonoscopy.  [facility].    . Hiatal hernia 10/20/2013   EGD confirmed.  [facility].  . Hyperlipemia   . Hypertension    no meds  . Neuromuscular disorder (HCC)    numbness rt hand  . Seasonal allergies   . Sleep apnea    uses CPAP nightly    Past Surgical History:  Procedure Laterality Date  . BLADDER STONE REMOVAL  2011, 2018  . CARPAL TUNNEL RELEASE  01/17/2012   Procedure: CARPAL TUNNEL RELEASE;  Surgeon: Cammie Sickle., MD;  Location: [place];  Service: Orthopedics;  Laterality: Right;  . [place]  . COLONOSCOPY  10/20/2013   [facility].  Polyps.   . CYSTOSCOPY WITH LITHOLAPAXY N/A 01/09/2017   Procedure: CYSTOSCOPY WITH LITHOLAPAXY;  Surgeon: Franchot Gallo, MD;  [facility];  Service: Urology;  Laterality: N/A;  . ESOPHAGOGASTRODUODENOSCOPY  10/20/2013    HH; biopsy of area concernig for Barrett's; gastritis.  Marland Kitchen EYE SURGERY  2012   both cataracts  . FRACTURE SURGERY     3 fingers  . HOLMIUM LASER APPLICATION N/A [id number]   Procedure: HOLMIUM LASER APPLICATION;  Surgeon: Franchot Gallo, MD;  Location: [facility];  Service: Urology;  Laterality: N/A;  . QUADRICEPS TENDON REPAIR  2003   bilateral  . ULNAR NERVE TRANSPOSITION  01/17/2012   Procedure: ULNAR NERVE DECOMPRESSION/TRANSPOSITION;  Surgeon: Cammie Sickle., MD;  Location: [place];  Service: Orthopedics;  Laterality: Right;  decompression of ulnar nerve only  at right cubital tunnel  . ULNAR NERVE TRANSPOSITION Left 06/01/2017   Procedure: DECOMPRESSION ULNAR NERVE LEFT CUBITAL TUNNEL;  Surgeon: Leanora Cover, MD;  Location: [place];  Service: Orthopedics;  Laterality: Left;  . UPPER GASTROINTESTINAL ENDOSCOPY    . VASECTOMY      Social History   Tobacco Use  . Smoking status: Former Smoker    Packs/day: 2.00    Years: 15.00    Pack years: 30.00    Types: Cigarettes    Quit date: 01/11/1974    Years since quitting: 45.6  . Smokeless tobacco: Never Used  Substance Use Topics  . Alcohol use: Yes    Alcohol/week: 1.0 standard drinks    Types: 1 Standard drinks or equivalent per week    Comment: occ    Family History  Problem Relation Age of Onset  . Cirrhosis Father   . Hypertension Father   . Alcohol abuse Father   . Heart failure Mother        in 45s  . Arthritis Brother        Rheumatoid arthritis  . Hypertension Brother     Review of Systems  Constitutional: Negative for chills and fever.  Respiratory: Negative for cough and shortness of breath.   Cardiovascular: Negative for chest pain, palpitations and leg swelling.  Gastrointestinal: Negative for abdominal pain, nausea and vomiting.     OBJECTIVE:  Today's Vitals   09/02/19 0757 09/02/19 0800  BP: (!) 148/74 130/70  Pulse: 78   Temp: 97.7 F  (36.5 C)   TempSrc: Oral   SpO2: 97%   Weight: 216 lb 12.8 oz (98.3 kg)   Height: 5\' 11"  (1.803 m)    Body mass index is 30.24 kg/m.  Wt Readings from Last 3 Encounters:  09/02/19 216 lb 12.8 oz (98.3 kg)  04/03/19 210 lb (  95.3 kg)  09/10/18 219 lb 3.2 oz (99.4 kg)    Physical Exam Vitals signs and nursing note reviewed.  Constitutional:      Appearance: He is well-developed.  HENT:     Head: Normocephalic and atraumatic.  Eyes:     Conjunctiva/sclera: Conjunctivae normal.     Pupils: Pupils are equal, round, and reactive to light.  Neck:     Musculoskeletal: Neck supple.  Cardiovascular:     Rate and Rhythm: Normal rate and regular rhythm.     Heart sounds: No murmur. No friction rub. No gallop.   Pulmonary:     Effort: Pulmonary effort is normal.     Breath sounds: Normal breath sounds. No wheezing or rales.  Skin:    General: Skin is warm and dry.  Neurological:     Mental Status: He is alert and oriented to person, place, and time.     No results found for this or any previous visit (from the past 24 hour(s)).  No results found.   ASSESSMENT and PLAN  1. Mixed hyperlipidemia Checking labs today, cont with LFM - Lipid panel - Comprehensive metabolic panel  2. Prediabetes Checking labs today, cont with LFM. Discussed weight loss - Hemoglobin A1c  3. OSA on CPAP Patient reports nightly use  Return in about 6 months (around 03/02/2020).    Rutherford Guys, MD Primary Care at [facility] [place], [place] [address] Ph.  [phone] Fax [phone]

## 2019-09-02 NOTE — Patient Instructions (Signed)
° ° ° °  If you have lab work done today you will be contacted with your lab results within the next 2 weeks.  If you have not heard from us then please contact us. The fastest way to get your results is to register for My Chart. ° ° °IF you received an x-ray today, you will receive an invoice from [facility]. Please contact [facility] at [phone] with questions or concerns regarding your invoice.  ° °IF you received labwork today, you will receive an invoice from [facility]. Please contact [facility] at [phone] with questions or concerns regarding your invoice.  ° °Our billing staff will not be able to assist you with questions regarding bills from these companies. ° °You will be contacted with the lab results as soon as they are available. The fastest way to get your results is to activate your My Chart account. Instructions are located on the last page of this paperwork. If you have not heard from us regarding the results in 2 weeks, please contact this office. °  ° ° ° °

## 2019-09-03 LAB — COMPREHENSIVE METABOLIC PANEL
ALT: 26 IU/L (ref 0–44)
AST: 30 IU/L (ref 0–40)
Albumin/Globulin Ratio: 1.9 (ref 1.2–2.2)
Albumin: 4.6 g/dL (ref 3.7–4.7)
Alkaline Phosphatase: 81 IU/L (ref 39–117)
BUN/Creatinine Ratio: 16 (ref 10–24)
BUN: 16 mg/dL (ref 8–27)
Bilirubin Total: 0.8 mg/dL (ref 0.0–1.2)
CO2: 23 mmol/L (ref 20–29)
Calcium: 9 mg/dL (ref 8.6–10.2)
Chloride: 101 mmol/L (ref 96–106)
Creatinine, Ser: 1.02 mg/dL (ref 0.76–1.27)
GFR calc Af Amer: 82 mL/min/{1.73_m2} (ref >59)
GFR calc non Af Amer: 71 mL/min/{1.73_m2} (ref >59)
Globulin, Total: 2.4 g/dL (ref 1.5–4.5)
Glucose: 98 mg/dL (ref 65–99)
Potassium: 3.8 mmol/L (ref 3.5–5.2)
Sodium: 138 mmol/L (ref 134–144)
Total Protein: 7 g/dL (ref 6.0–8.5)

## 2019-09-03 LAB — LIPID PANEL
Chol/HDL Ratio: 4.7 ratio (ref 0.0–5.0)
Cholesterol, Total: 200 mg/dL — ABNORMAL HIGH (ref 100–199)
HDL: 43 mg/dL (ref >39)
LDL Chol Calc (NIH): 128 mg/dL — ABNORMAL HIGH (ref 0–99)
Triglycerides: 163 mg/dL — ABNORMAL HIGH (ref 0–149)
VLDL Cholesterol Cal: 29 mg/dL (ref 5–40)

## 2019-09-03 LAB — HEMOGLOBIN A1C
Est. average glucose Bld gHb Est-mCnc: 120 mg/dL
Hgb A1c MFr Bld: 5.8 % — ABNORMAL HIGH (ref 4.8–5.6)

## 2019-09-10 ENCOUNTER — Ambulatory Visit (INDEPENDENT_AMBULATORY_CARE_PROVIDER_SITE_OTHER): Payer: Medicare Other | Admitting: Family Medicine

## 2019-09-10 VITALS — BP 130/70 | Ht 72.0 in | Wt 216.0 lb

## 2019-09-10 DIAGNOSIS — Z0000 Encounter for general adult medical examination without abnormal findings: Secondary | ICD-10-CM

## 2019-09-10 NOTE — Patient Instructions (Signed)
Thank you for taking time to come for your Medicare Wellness Visit. I appreciate your ongoing commitment to your health goals. Please review the following plan we discussed and let me know if I can assist you in the future.  Todd Kennedy LPN  Preventive Care 76 Years and Older, Male Preventive care refers to lifestyle choices and visits with your health care provider that can promote health and wellness. This includes:  A yearly physical exam. This is also called an annual well check.  Regular dental and eye exams.  Immunizations.  Screening for certain conditions.  Healthy lifestyle choices, such as diet and exercise. What can I expect for my preventive care visit? Physical exam Your health care provider will check:  Height and weight. These may be used to calculate body mass index (BMI), which is a measurement that tells if you are at a healthy weight.  Heart rate and blood pressure.  Your skin for abnormal spots. Counseling Your health care provider may ask you questions about:  Alcohol, tobacco, and drug use.  Emotional well-being.  Home and relationship well-being.  Sexual activity.  Eating habits.  History of falls.  Memory and ability to understand (cognition).  Work and work Statistician. What immunizations do I need?  Influenza (flu) vaccine  This is recommended every year. Tetanus, diphtheria, and pertussis (Tdap) vaccine  You may need a Td booster every 10 years. Varicella (chickenpox) vaccine  You may need this vaccine if you have not already been vaccinated. Zoster (shingles) vaccine  You may need this after age 66. Pneumococcal conjugate (PCV13) vaccine  One dose is recommended after age 84. Pneumococcal polysaccharide (PPSV23) vaccine  One dose is recommended after age 88. Measles, mumps, and rubella (MMR) vaccine  You may need at least one dose of MMR if you were born in 1957 or later. You may also need a second dose. Meningococcal  conjugate (MenACWY) vaccine  You may need this if you have certain conditions. Hepatitis A vaccine  You may need this if you have certain conditions or if you travel or work in places where you may be exposed to hepatitis A. Hepatitis B vaccine  You may need this if you have certain conditions or if you travel or work in places where you may be exposed to hepatitis B. Haemophilus influenzae type b (Hib) vaccine  You may need this if you have certain conditions. You may receive vaccines as individual doses or as more than one vaccine together in one shot (combination vaccines). Talk with your health care provider about the risks and benefits of combination vaccines. What tests do I need? Blood tests  Lipid and cholesterol levels. These may be checked every 5 years, or more frequently depending on your overall health.  Hepatitis C test.  Hepatitis B test. Screening  Lung cancer screening. You may have this screening every year starting at age 24 if you have a 30-pack-year history of smoking and currently smoke or have quit within the past 15 years.  Colorectal cancer screening. All adults should have this screening starting at age 52 and continuing until age 61. Your health care provider may recommend screening at age 57 if you are at increased risk. You will have tests every 1-10 years, depending on your results and the type of screening test.  Prostate cancer screening. Recommendations will vary depending on your family history and other risks.  Diabetes screening. This is done by checking your blood sugar (glucose) after you have not eaten for  a while (fasting). You may have this done every 1-3 years.  Abdominal aortic aneurysm (AAA) screening. You may need this if you are a current or former smoker.  Sexually transmitted disease (STD) testing. Follow these instructions at home: Eating and drinking  Eat a diet that includes fresh fruits and vegetables, whole grains, lean  protein, and low-fat dairy products. Limit your intake of foods with high amounts of sugar, saturated fats, and salt.  Take vitamin and mineral supplements as recommended by your health care provider.  Do not drink alcohol if your health care provider tells you not to drink.  If you drink alcohol: ? Limit how much you have to 0-2 drinks a day. ? Be aware of how much alcohol is in your drink. In the [place] one drink equals one 12 oz bottle of beer (355 mL), one 5 oz glass of wine (148 mL), or one 1 oz glass of hard liquor (44 mL). Lifestyle  Take daily care of your teeth and gums.  Stay active. Exercise for at least 30 minutes on 5 or more days each week.  Do not use any products that contain nicotine or tobacco, such as cigarettes, e-cigarettes, and chewing tobacco. If you need help quitting, ask your health care provider.  If you are sexually active, practice safe sex. Use a condom or other form of protection to prevent STIs (sexually transmitted infections).  Talk with your health care provider about taking a low-dose aspirin or statin. What's next?  Visit your health care provider once a year for a well check visit.  Ask your health care provider how often you should have your eyes and teeth checked.  Stay up to date on all vaccines. This information is not intended to replace advice given to you by your health care provider. Make sure you discuss any questions you have with your health care provider. Document Released: 11/20/2015 Document Revised: 10/18/2018 Document Reviewed: 10/18/2018 Elsevier Patient Education  2020 [employer].

## 2019-09-10 NOTE — Progress Notes (Signed)
Presents today for [employer] Visit   Date of last exam: 09/02/2019  Interpreter used for this visit? No  I connected with  Todd [name] on 09/10/19 by a telephone enabled application and verified that I am speaking with the correct person using two identifiers.   I discussed the limitations of evaluation and management by telemedicine. The patient expressed understanding and agreed to proceed.   Patient Care Team: Rutherford Guys, MD as PCP - General (Family Medicine) Marygrace Drought, MD as Consulting Physician (Ophthalmology) Franchot Gallo, MD as Consulting Physician (Urology)   Other items to address today:   Discussed Eye/Dental Discussed Immunizations   Other Screening: Last screening for diabetes: 09/02/2019 Last lipid screening: 09/02/2019  ADVANCE DIRECTIVES: Discussed: yes On File: no Materials Provided: yes (mailed)  Immunization status:  Immunization History  Administered Date(s) Administered  . Influenza Inj Mdck Quad Pf 08/13/2019  . Influenza, High Dose Seasonal PF 07/24/2018  . Influenza,inj,Quad PF,6+ Mos 07/15/2013, 07/21/2014, 07/07/2015, 07/06/2016, 08/04/2017  . Pneumococcal Conjugate-13 08/11/2014  . Pneumococcal Polysaccharide-23 03/10/2008, 08/23/2016  . Tdap 06/14/2010, 05/23/2014  . Zoster 11/07/2010  . Zoster Recombinat (Shingrix) 09/11/2017, 02/02/2018     There are no preventive care reminders to display for this patient.   Functional Status Survey: Is the patient deaf or have difficulty hearing?: No(has hearing aids in both ears) Does the patient have difficulty seeing, even when wearing glasses/contacts?: No Does the patient have difficulty concentrating, remembering, or making decisions?: No Does the patient have difficulty walking or climbing stairs?: No Does the patient have difficulty dressing or bathing?: No Does the patient have difficulty doing errands alone such as visiting a doctor's office or  shopping?: No   6CIT Screen 09/10/2019 09/03/2018 08/22/2017  What Year? 0 points 0 points 0 points  What month? 0 points 0 points 0 points  What time? 0 points 0 points 0 points  Count back from 20 0 points 0 points 0 points  Months in reverse 0 points 0 points 0 points  Repeat phrase 0 points 0 points 2 points  Total Score 0 0 2        Clinical Support from 09/10/2019 in [place] [facility]  AUDIT-C Score  2       Home Environment:   Lives in one story home  No scattered rugs Yes grab bars in shower Adequate lighting/no clutter  Surgery 11/4  to have extra skin off around eyes to increase to increase field of vision   Patient Active Problem List   Diagnosis Date Noted  . Prediabetes 03/07/2019  . Bladder stone 03/20/2017  . 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 OSA on CPAP 02/21/2017  . Primary osteoarthritis of both hands 03/01/2016  . Mixed hyperlipidemia 03/01/2016  . Seasonal allergic rhinitis due to pollen 03/01/2016  . BMI 31.0-31.9,adult 03/01/2016  . Obesity 03/01/2016  . Angioedema 02/16/2013     Past Medical History:  Diagnosis Date  . Allergy   . Angioedema   . Arthritis   . 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  [facility].  . Bladder stone   . Cancer ([place])    skin cancer multiple; followed by [facility]/dermatology yearly.  . Cataract    4 YEARS - BILATERAL  . Colon polyps     Previous polyps on colonoscopy.  [facility].    . Hiatal hernia 10/20/2013   EGD confirmed.  [facility].  . Hyperlipemia   .  Hypertension    no meds  . Neuromuscular disorder ([facility])    numbness rt hand  . Seasonal allergies   . Sleep apnea    uses CPAP nightly     Past Surgical History:  Procedure Laterality Date  . BLADDER STONE REMOVAL  2011, 2018  . CARPAL TUNNEL RELEASE  01/17/2012   Procedure: CARPAL TUNNEL RELEASE;  Surgeon: Cammie Sickle., MD;  Location: [place];  Service: Orthopedics;   Laterality: Right;  . [place]  . COLONOSCOPY  10/20/2013   [facility].  Polyps.   . CYSTOSCOPY WITH LITHOLAPAXY N/A 01/09/2017   Procedure: CYSTOSCOPY WITH LITHOLAPAXY;  Surgeon: Franchot Gallo, MD;  Location[facility];  Service: Urology;  Laterality: N/A;  . ESOPHAGOGASTRODUODENOSCOPY  10/20/2013   [facility]; biopsy of area concernig for Barrett's; gastritis.  Marland Kitchen EYE SURGERY  2012   both cataracts  . FRACTURE SURGERY     3 fingers  . HOLMIUM LASER APPLICATION N/A [id number]   Procedure: HOLMIUM LASER APPLICATION;  Surgeon: Franchot Gallo, MD;  Location[facility];  Service: Urology;  Laterality: N/A;  . QUADRICEPS TENDON REPAIR  2003   bilateral  . ULNAR NERVE TRANSPOSITION  01/17/2012   Procedure: ULNAR NERVE DECOMPRESSION/TRANSPOSITION;  Surgeon: Cammie Sickle., MD;  Location: [place];  Service: Orthopedics;  Laterality: Right;  decompression of ulnar nerve only  at right cubital tunnel  . ULNAR NERVE TRANSPOSITION Left 06/01/2017   Procedure: DECOMPRESSION ULNAR NERVE LEFT CUBITAL TUNNEL;  Surgeon: Leanora Cover, MD;  Location: [place];  Service: Orthopedics;  Laterality: Left;  . UPPER GASTROINTESTINAL ENDOSCOPY    . VASECTOMY       Family History  Problem Relation Age of Onset  . Cirrhosis Father   . Hypertension Father   . Alcohol abuse Father   . Heart failure Mother        in 27s  . Arthritis Brother        Rheumatoid arthritis  . Hypertension Brother      Social History   Socioeconomic History  . Marital status: Married    Spouse name: Not on file  . Number of children: 2  . Years of education: 45  . Highest education level: Not on file  Occupational History  . Occupation: Retired  Scientific laboratory technician  . Financial resource strain: Not on file  . Food insecurity    Worry: Not on file    Inability: Not on file  . Transportation needs    Medical: Not on file    Non-medical: Not on file   Tobacco Use  . Smoking status: Former Smoker    Packs/day: 2.00    Years: 15.00    Pack years: 30.00    Types: Cigarettes    Quit date: 01/11/1974    Years since quitting: 45.6  . Smokeless tobacco: Never Used  Substance and Sexual Activity  . Alcohol use: Yes    Alcohol/week: 1.0 standard drinks    Types: 1 Standard drinks or equivalent per week    Comment: occ  . Drug use: No  . Sexual activity: Never  Lifestyle  . Physical activity    Days per week: Not on file    Minutes per session: Not on file  . Stress: Not on file  Relationships  . Social Herbalist on phone: Not on file    Gets together: Not on file  Attends religious service: Not on file    Active member of club or organization: Not on file    Attends meetings of clubs or organizations: Not on file    Relationship status: Not on file  . Intimate partner violence    Fear of current or ex partner: Not on file    Emotionally abused: Not on file    Physically abused: Not on file    Forced sexual activity: Not on file  Other Topics Concern  . Not on file  Social History Narrative   Marital status:  Married x 42 years      Children: 2 daughters; 3 grandchildren.      Employment; retired in 1998 from [employer]; retired age 19 from [employer] after 14 years.      Lives: with wife.      Tobacco: former smoker; quit at age 58.  Smoked 15 years.      Alcohol:  On vacation or special occasions.        Education: [employer].       Exercise: Gym 3-4 times a week for 2 hours.; exercises 7 days per week.   Elliptical for one hour.      Advanced Directives: none; +desires FULL CODE.        ADLs: independent with ADLs; drives.  Does not walk with assistant devices.     Drinks 3-4 caffeine drinks a day      Allergies  Allergen Reactions  . Ace Inhibitors Cough  . Amoxicillin Nausea And Vomiting  . Darvon     Chest pain  . Toprol Xl [Metoprolol Tartrate]      Prior to Admission  medications   Medication Sig Start Date End Date Taking? Authorizing Provider  cetirizine (ZYRTEC) 10 MG tablet Take 1 tablet (10 mg total) by mouth at bedtime. 08/23/16  Yes Wardell Honour, MD  cyclobenzaprine (FLEXERIL) 10 MG tablet Take 1 tablet (10 mg total) by mouth 3 (three) times daily as needed for muscle spasms. 03/07/19  Yes Rutherford Guys, MD  Digestive Enzymes (PAPAYA AND ENZYMES PO) Take by mouth.   Yes [provider]  fish oil-omega-3 fatty acids 1000 MG capsule daily. 2000 mg BID   Yes [provider]  fluticasone (FLONASE) 50 MCG/ACT nasal spray Place 2 sprays into both nostrils daily. AS NEEDED 08/23/16  Yes Wardell Honour, MD  Garlic [id number] MG CAPS Take by mouth.   Yes [provider]  GARLIC PO Take [id number] mg by mouth 2 (two) times daily.    Yes [provider]  Misc Natural Products (GLUCOSAMINE CHOND COMPLEX/MSM PO) Take by mouth.   Yes [provider]  Multiple Vitamin (MULTIVITAMIN) tablet Take 1 tablet by mouth daily.   Yes [provider]  saw palmetto 160 MG capsule Take 160 mg by mouth 2 (two) times daily.   Yes [provider]     Depression screen [facility] 2/9 09/02/2019 04/03/2019 03/07/2019 02/26/2018 08/28/2017  Decreased Interest 0 0 0 0 0  Down, Depressed, Hopeless 0 0 0 0 0  PHQ - 2 Score 0 0 0 0 0     Fall Risk  09/02/2019 04/03/2019 03/07/2019 02/26/2018 08/28/2017  Falls in the past year? 0 0 0 No No  Number falls in past yr: 0 0 0 - -  Injury with Fall? 0 1 0 - -  Comment - - - - -  Follow up Falls evaluation completed - - - -  PHYSICAL EXAM: BP 130/70 Comment: taken from previous visit  Ht 6' (1.829 m) Comment: taken from previous visit  Wt 216 lb (98 kg)   BMI 29.29 kg/m    Medicare annual wellness visit, subsequent     Education/Counseling provided regarding diet and exercise, prevention of chronic diseases, smoking/tobacco cessation, if applicable, and reviewed "Covered  Medicare Preventive Services."

## 2019-09-12 ENCOUNTER — Ambulatory Visit: Payer: Medicare Other | Admitting: Adult Health

## 2019-12-15 ENCOUNTER — Ambulatory Visit: Payer: Medicare Other | Attending: Internal Medicine

## 2019-12-15 DIAGNOSIS — Z23 Encounter for immunization: Secondary | ICD-10-CM | POA: Insufficient documentation

## 2019-12-15 NOTE — Progress Notes (Signed)
   Covid-19 Vaccination Clinic  Name:  Todd [name]    MRN: [id number] DOB: 02-28-1941  12/15/2019  [name] was observed post Covid-19 immunization for 15 minutes without incidence. He was provided with Vaccine Information Sheet and instruction to access the V-Safe system.   [name] was instructed to call 911 with any severe reactions post vaccine: Marland Kitchen Difficulty breathing  . Swelling of your face and throat  . A fast heartbeat  . A bad rash all over your body  . Dizziness and weakness    Immunizations Administered    Name Date Dose VIS Date Route   Pfizer COVID-19 Vaccine 12/15/2019  2:40 PM 0.3 mL 10/18/2019 Intramuscular   Manufacturer: [place]   Lot: [id number]   [place]: [id number]

## 2019-12-24 ENCOUNTER — Other Ambulatory Visit: Payer: Self-pay

## 2019-12-24 ENCOUNTER — Ambulatory Visit (INDEPENDENT_AMBULATORY_CARE_PROVIDER_SITE_OTHER): Payer: Medicare Other | Admitting: Adult Health

## 2019-12-24 ENCOUNTER — Encounter: Payer: Self-pay | Admitting: Adult Health

## 2019-12-24 VITALS — BP 151/89 | HR 89 | Temp 97.7°F | Ht 71.0 in | Wt 218.0 lb

## 2019-12-24 DIAGNOSIS — G4733 Obstructive sleep apnea (adult) (pediatric): Secondary | ICD-10-CM | POA: Diagnosis not present

## 2019-12-24 NOTE — Progress Notes (Signed)
PATIENT: Todd [name] DOB: July 23, 1941  REASON FOR VISIT: follow up HISTORY FROM: patient  HISTORY OF PRESENT ILLNESS: Today 12/24/19:  Todd [name] is a 79 year old male with a history of obstructive sleep apnea on BiPAP.  He returns today for follow-up.  His download indicates that he use his machine nightly for compliance of 100%.  He uses machine greater than 4 hours each night.  On average he uses his machine 7 hours and 57 minutes.  His residual AHI is 12.7 on 18/14 centimeters of water.  His leak in the 95th percentile is 1.  He reports that if he stays busy during the day he does not feel sleepy.  However if he sits down he tends to get tired.  He returns today for an evaluation.  HISTORY 09/10/18:  Todd [name]  is a 79 year old male with a history of sleep apnea.  He returns today for follow-up.  He is currently on BiPAP.  His download indicates that he uses machine 30 out of 30 days for compliance of 100%.  He uses machine greater than 4 hours each night.  On average he uses his machine 7 hours and 59 minutes.  His residual AHI is 15.6 on 18/14 centimeters of water.  He does not have a significant leak.  His AHI has improved from his initial sleep study.  He reports that he continues to tolerate the Bipap well.  He returns today for evaluation.  REVIEW OF SYSTEMS: Out of a complete 14 system review of symptoms, the patient complains only of the following symptoms, and all other reviewed systems are negative.  Epworth sleepiness score 14 fatigue severity score 15  ALLERGIES: Allergies  Allergen Reactions  . Ace Inhibitors Cough  . Amoxicillin Nausea And Vomiting  . Darvon     Chest pain  . Toprol Xl [Metoprolol Tartrate]     HOME MEDICATIONS: Outpatient Medications Prior to Visit  Medication Sig Dispense Refill  . cetirizine (ZYRTEC) 10 MG tablet Take 1 tablet (10 mg total) by mouth at bedtime. 90 tablet 3  . Digestive Enzymes (PAPAYA AND ENZYMES PO) Take by mouth.    . fish  oil-omega-3 fatty acids 1000 MG capsule daily. 2000 mg BID    . fluticasone (FLONASE) 50 MCG/ACT nasal spray Place 2 sprays into both nostrils daily. AS NEEDED 16 g 11  . Garlic [id number] MG CAPS Take by mouth.    Marland Kitchen GARLIC PO Take [id number] mg by mouth 2 (two) times daily.     . Misc Natural Products (GLUCOSAMINE CHOND COMPLEX/MSM PO) Take by mouth.    . Multiple Vitamin (MULTIVITAMIN) tablet Take 1 tablet by mouth daily.    . saw palmetto 160 MG capsule Take 160 mg by mouth 2 (two) times daily.    . cyclobenzaprine (FLEXERIL) 10 MG tablet Take 1 tablet (10 mg total) by mouth 3 (three) times daily as needed for muscle spasms. 30 tablet 0   No facility-administered medications prior to visit.    PAST MEDICAL HISTORY: Past Medical History:  Diagnosis Date  . Allergy   . Angioedema   . Arthritis   . 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  [facility].  . Bladder stone   . Cancer ([place])    skin cancer multiple; followed by [facility]ology yearly.  . Cataract    4 YEARS - BILATERAL  . Colon polyps     Previous polyps on colonoscopy.  [facility].    . Hiatal hernia 10/20/2013  EGD confirmed.  [facility].  . Hyperlipemia   . Hypertension    no meds  . Neuromuscular disorder ([facility])    numbness rt hand  . Seasonal allergies   . Sleep apnea    uses CPAP nightly    PAST SURGICAL HISTORY: Past Surgical History:  Procedure Laterality Date  . BLADDER STONE REMOVAL  2011, 2018  . CARPAL TUNNEL RELEASE  01/17/2012   Procedure: CARPAL TUNNEL RELEASE;  Surgeon: Cammie Sickle., MD;  Location: [facility];  Service: Orthopedics;  Laterality: Right;  . [place]  . COLONOSCOPY  10/20/2013   [facility].  Polyps.   . CYSTOSCOPY WITH LITHOLAPAXY N/A 01/09/2017   Procedure: CYSTOSCOPY WITH LITHOLAPAXY;  Surgeon: Franchot Gallo, MD;  Location: [facility];  Service: Urology;  Laterality: N/A;  . ESOPHAGOGASTRODUODENOSCOPY  10/20/2013   [facility]; biopsy of area concernig for  Barrett's; gastritis.  Marland Kitchen EYE SURGERY  2012   both cataracts  . FRACTURE SURGERY     3 fingers  . HOLMIUM LASER APPLICATION N/A [id number]   Procedure: HOLMIUM LASER APPLICATION;  Surgeon: Franchot Gallo, MD;  Location: [facility];  Service: Urology;  Laterality: N/A;  . QUADRICEPS TENDON REPAIR  2003   bilateral  . ULNAR NERVE TRANSPOSITION  01/17/2012   Procedure: ULNAR NERVE DECOMPRESSION/TRANSPOSITION;  Surgeon: Cammie Sickle., MD;  Location: [place];  Service: Orthopedics;  Laterality: Right;  decompression of ulnar nerve only  at right cubital tunnel  . ULNAR NERVE TRANSPOSITION Left 06/01/2017   Procedure: DECOMPRESSION ULNAR NERVE LEFT CUBITAL TUNNEL;  Surgeon: Leanora Cover, MD;  Location: [place];  Service: Orthopedics;  Laterality: Left;  . UPPER GASTROINTESTINAL ENDOSCOPY    . VASECTOMY      FAMILY HISTORY: Family History  Problem Relation Age of Onset  . Cirrhosis Father   . Hypertension Father   . Alcohol abuse Father   . Heart failure Mother        in 31s  . Arthritis Brother        Rheumatoid arthritis  . Hypertension Brother     SOCIAL HISTORY: Social History   Socioeconomic History  . Marital status: Married    Spouse name: Not on file  . Number of children: 2  . Years of education: 35  . Highest education level: Not on file  Occupational History  . Occupation: Retired  Tobacco Use  . Smoking status: Former Smoker    Packs/day: 2.00    Years: 15.00    Pack years: 30.00    Types: Cigarettes    Quit date: 01/11/1974    Years since quitting: 45.9  . Smokeless tobacco: Never Used  Substance and Sexual Activity  . Alcohol use: Yes    Alcohol/week: 1.0 standard drinks    Types: 1 Standard drinks or equivalent per week    Comment: occ  . Drug use: No  . Sexual activity: Never  Other Topics Concern  . Not on file  Social History Narrative   Marital status:  Married x 42 years      Children: 2  daughters; 3 grandchildren.      Employment; retired in 1998 from [employer]; retired age 40 from [employer] after 14 years.      Lives: with wife.      Tobacco: former smoker; quit at age 29.  Smoked 15 years.      Alcohol:  On vacation or special  occasions.        Education: [employer].       Exercise: Gym 3-4 times a week for 2 hours.; exercises 7 days per week.   Elliptical for one hour.      Advanced Directives: none; +desires FULL CODE.        ADLs: independent with ADLs; drives.  Does not walk with assistant devices.     Drinks 3-4 caffeine drinks a day    Social Determinants of Health   Financial Resource Strain:   . Difficulty of Paying Living Expenses: Not on file  Food Insecurity:   . Worried About Charity fundraiser in the Last Year: Not on file  . Ran Out of Food in the Last Year: Not on file  Transportation Needs:   . Lack of Transportation (Medical): Not on file  . Lack of Transportation (Non-Medical): Not on file  Physical Activity:   . Days of Exercise per Week: Not on file  . Minutes of Exercise per Session: Not on file  Stress:   . Feeling of Stress : Not on file  Social Connections:   . Frequency of Communication with Friends and Family: Not on file  . Frequency of Social Gatherings with Friends and Family: Not on file  . Attends Religious Services: Not on file  . Active Member of Clubs or Organizations: Not on file  . Attends Archivist Meetings: Not on file  . Marital Status: Not on file  Intimate Partner Violence:   . Fear of Current or Ex-Partner: Not on file  . Emotionally Abused: Not on file  . Physically Abused: Not on file  . Sexually Abused: Not on file      PHYSICAL EXAM  Vitals:   12/24/19 0822  BP: (!) 151/89  Pulse: 89  Temp: 97.7 F (36.5 C)  Weight: 218 lb (98.9 kg)  Height: 5\' 11"  (1.803 m)   Body mass index is 30.4 kg/m.  Generalized: Well developed, in no acute distress  Chest: Lungs clear to  auscultation bilaterally  Neurological examination  Mentation: Alert oriented to time, place, history taking. Follows all commands speech and language fluent Cranial nerve II-XII: Extraocular movements were full, visual field were full on confrontational test Head turning and shoulder shrug  were normal and symmetric. Motor: The motor testing reveals 5 over 5 strength of all 4 extremities. Good symmetric motor tone is noted throughout.  Sensory: Sensory testing is intact to soft touch on all 4 extremities. No evidence of extinction is noted.  Gait and station: Gait is normal.    DIAGNOSTIC DATA (LABS, IMAGING, TESTING) - I reviewed patient records, labs, notes, testing and imaging myself where available.  Lab Results  Component Value Date   WBC 6.4 05/03/2018   HGB 14.4 05/03/2018   HCT 41.2 05/03/2018   MCV 83.9 05/03/2018   PLT 226 05/03/2018      Component Value Date/Time   NA 138 09/02/2019 1144   K 3.8 09/02/2019 1144   CL 101 09/02/2019 1144   CO2 23 09/02/2019 1144   GLUCOSE 98 09/02/2019 1144   GLUCOSE 137 (H) 05/03/2018 1054   BUN 16 09/02/2019 1144   CREATININE 1.02 09/02/2019 1144   CREATININE 0.89 08/23/2016 1016   CALCIUM 9.0 09/02/2019 1144   PROT 7.0 09/02/2019 1144   ALBUMIN 4.6 09/02/2019 1144   AST 30 09/02/2019 1144   ALT 26 09/02/2019 1144   ALKPHOS 81 09/02/2019 1144   BILITOT 0.8 09/02/2019 1144  GFRNONAA 71 09/02/2019 1144   GFRAA 82 09/02/2019 1144   Lab Results  Component Value Date   CHOL 200 (H) 09/02/2019   HDL 43 09/02/2019   LDLCALC 128 (H) 09/02/2019   TRIG 163 (H) 09/02/2019   CHOLHDL 4.7 09/02/2019   Lab Results  Component Value Date   HGBA1C 5.8 (H) 09/02/2019   No results found for: [id number] Lab Results  Component Value Date   TSH 1.200 03/18/2019      ASSESSMENT AND PLAN 79 y.o. year old male  has a past medical history of Allergy, Angioedema, Arthritis, Barrett esophagus (11/07/2008), Bladder stone, Cancer ([place]),  Cataract, Colon polyps, Hiatal hernia (10/20/2013), Hyperlipemia, Hypertension, Neuromuscular disorder ([place]), Seasonal allergies, and Sleep apnea. here with:  1.  Obstructive sleep apnea on CPAP  -Good compliance -Residual AHI is elevated we will increase pressure to 19/15. -I will pull another download in 1 to 2 months to see if his AHI has improved -Follow-up in 6 months or sooner if needed   I spent 15 minutes with the patient this time was spent reviewing CPAP download   Ward Givens, MSN, NP-C 12/24/2019, 8:34 AM [facility] [address] [place], [place] [address] [phone]

## 2019-12-24 NOTE — Patient Instructions (Signed)
Your Plan:  Continue Bipap Pressure increase to 19/15. Order sent to DME If your symptoms worsen or you develop new symptoms please let us know.   Thank you for coming to see [place] at [facility]. I hope we have been able to provide you high quality care today.  You may receive a patient satisfaction survey over the next few weeks. We would appreciate your feedback and comments so that we may continue to improve ourselves and the health of our patients.

## 2019-12-30 ENCOUNTER — Telehealth: Payer: Self-pay | Admitting: Adult Health

## 2019-12-30 NOTE — Telephone Encounter (Signed)
Pt called stating that he has yet to hear from [facility] to increase his cpap pressure. Pt would like to know what can he do to expedite this. Please advise.

## 2019-12-30 NOTE — Telephone Encounter (Signed)
I sent CMM for pt that needed to increase cpap pressure.  (new orders in Epic).  Received confirmation back that did receive.  I responded to [facility] that pt would like to be notified when pressure is changed.

## 2020-01-09 ENCOUNTER — Ambulatory Visit: Payer: Medicare Other | Attending: Internal Medicine

## 2020-01-09 DIAGNOSIS — Z23 Encounter for immunization: Secondary | ICD-10-CM | POA: Insufficient documentation

## 2020-01-09 NOTE — Progress Notes (Signed)
   Covid-19 Vaccination Clinic  Name:  Todd [name]    MRN: [id number] DOB: [birth year]  01/09/2020  [name] was observed post Covid-19 immunization for 15 minutes without incident. He was provided with Vaccine Information Sheet and instruction to access the V-Safe system.   [name] was instructed to call 911 with any severe reactions post vaccine: Marland Kitchen Difficulty breathing  . Swelling of face and throat  . A fast heartbeat  . A bad rash all over body  . Dizziness and weakness   Immunizations Administered    Name Date Dose VIS Date Route   Pfizer COVID-19 Vaccine 01/09/2020  9:47 AM 0.3 mL 10/18/2019 Intramuscular   Manufacturer: [place]   Lot: [id number]   [place]: [id number]

## 2020-03-02 ENCOUNTER — Encounter: Payer: Self-pay | Admitting: Family Medicine

## 2020-03-02 ENCOUNTER — Ambulatory Visit (INDEPENDENT_AMBULATORY_CARE_PROVIDER_SITE_OTHER): Payer: Medicare Other | Admitting: Family Medicine

## 2020-03-02 ENCOUNTER — Other Ambulatory Visit: Payer: Self-pay

## 2020-03-02 VITALS — BP 139/83 | HR 79 | Temp 97.9°F | Ht 71.0 in | Wt 217.0 lb

## 2020-03-02 DIAGNOSIS — G4733 Obstructive sleep apnea (adult) (pediatric): Secondary | ICD-10-CM | POA: Diagnosis not present

## 2020-03-02 DIAGNOSIS — Z9989 Dependence on other enabling machines and devices: Secondary | ICD-10-CM

## 2020-03-02 DIAGNOSIS — R7303 Prediabetes: Secondary | ICD-10-CM

## 2020-03-02 DIAGNOSIS — E782 Mixed hyperlipidemia: Secondary | ICD-10-CM

## 2020-03-02 NOTE — Progress Notes (Signed)
4/26/20218:16 AM  Despina Hick January 21, 1941, 79 y.o., male [id number]  Chief Complaint  Patient presents with  . ankles, feet , hands, hip    tingling and pain   . Hypertension    avg 121/76    HPI:   Patient is a 79 y.o. male with past medical history significant for HLP, prediabetes, BPH, OSA on cpap who presents today for routine followup  Last OV oct 2020  Recently saw urologist for increased urination - started on mybertriq Will be leaving for [facility] later this week His OA is progressing, hands, hips Having tingling of his his feet, - chronic, wearing shoes help Still remain able to be active, play the piano, exercises Got covid vaccines Checks bp at home - avg 121/76 Uses cpap every night, last oV with sleep feb 2021  Depression screen [facility] 2/9 03/02/2020 09/10/2019 09/02/2019  Decreased Interest 0 0 0  Down, Depressed, Hopeless 0 0 0  PHQ - 2 Score 0 0 0    Fall Risk  03/02/2020 09/10/2019 09/02/2019 04/03/2019 03/07/2019  Falls in the past year? 0 0 0 0 0  Number falls in past yr: 0 0 0 0 0  Injury with Fall? 0 0 0 1 0  Comment - - - - -  Risk for fall due to : - History of fall(s) - - -  Follow up Falls evaluation completed Falls evaluation completed Falls evaluation completed - -     Allergies  Allergen Reactions  . Ace Inhibitors Cough  . Amoxicillin Nausea And Vomiting  . Darvon     Chest pain  . Toprol Xl [Metoprolol Tartrate]     Prior to Admission medications   Medication Sig Start Date End Date Taking? Authorizing Provider  cetirizine (ZYRTEC) 10 MG tablet Take 1 tablet (10 mg total) by mouth at bedtime. 08/23/16  Yes Wardell Honour, MD  Digestive Enzymes (PAPAYA AND ENZYMES PO) Take by mouth.   Yes [provider]  fish oil-omega-3 fatty acids 1000 MG capsule daily. 2000 mg BID   Yes [provider]  fluticasone (FLONASE) 50 MCG/ACT nasal spray Place 2 sprays into both nostrils daily. AS NEEDED 08/23/16  Yes Wardell Honour, MD    Garlic 101 MG CAPS Take by mouth.   Yes [provider]  GARLIC PO Take 751 mg by mouth 2 (two) times daily.    Yes [provider]  mirabegron ER (MYRBETRIQ) 25 MG TB24 tablet Take 25 mg by mouth daily.   Yes [provider]  Multiple Vitamin (MULTIVITAMIN) tablet Take 1 tablet by mouth daily.   Yes [provider]  psyllium (REGULOID) 0.52 g capsule Take 0.52 g by mouth daily.   Yes [provider]  saw palmetto 160 MG capsule Take 160 mg by mouth 2 (two) times daily.   Yes [provider]  Misc Natural Products (GLUCOSAMINE CHOND COMPLEX/MSM PO) Take by mouth.    [provider]    Past Medical History:  Diagnosis Date  . Allergy   . Angioedema   . Arthritis   . 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  [facility].  . Bladder stone   . Cancer ([place])    skin cancer multiple; followed by [facility]matology yearly.  . Cataract    4 YEARS - BILATERAL  . Colon polyps     Previous polyps on colonoscopy.  [facility].    . Hiatal hernia 10/20/2013   EGD confirmed.  [facility].  Marland Kitchen  Hyperlipemia   . Hypertension    no meds  . Neuromuscular disorder (HCC)    numbness rt hand  . Seasonal allergies   . Sleep apnea    uses CPAP nightly    Past Surgical History:  Procedure Laterality Date  . BLADDER STONE REMOVAL  2011, 2018  . CARPAL TUNNEL RELEASE  01/17/2012   Procedure: CARPAL TUNNEL RELEASE;  Surgeon: Cammie Sickle., MD;  Location: [place];  Service: Orthopedics;  Laterality: Right;  . [place]  . COLONOSCOPY  10/20/2013   [facility].  Polyps.   . CYSTOSCOPY WITH LITHOLAPAXY N/A 01/09/2017   Procedure: CYSTOSCOPY WITH LITHOLAPAXY;  Surgeon: Franchot Gallo, MD;  Location: [facility];  Service: Urology;  Laterality: N/A;  . ESOPHAGOGASTRODUODENOSCOPY  10/20/2013   [facility]; biopsy of area concernig for Barrett's; gastritis.  Marland Kitchen EYE SURGERY  2012   both cataracts  . FRACTURE SURGERY     3  fingers  . HOLMIUM LASER APPLICATION N/A 12/12/4268   Procedure: HOLMIUM LASER APPLICATION;  Surgeon: Franchot Gallo, MD;  Location[facility];  Service: Urology;  Laterality: N/A;  . QUADRICEPS TENDON REPAIR  2003   bilateral  . ULNAR NERVE TRANSPOSITION  01/17/2012   Procedure: ULNAR NERVE DECOMPRESSION/TRANSPOSITION;  Surgeon: Cammie Sickle., MD;  Location: [place];  Service: Orthopedics;  Laterality: Right;  decompression of ulnar nerve only  at right cubital tunnel  . ULNAR NERVE TRANSPOSITION Left 06/01/2017   Procedure: DECOMPRESSION ULNAR NERVE LEFT CUBITAL TUNNEL;  Surgeon: Leanora Cover, MD;  Location: [place];  Service: Orthopedics;  Laterality: Left;  . UPPER GASTROINTESTINAL ENDOSCOPY    . VASECTOMY      Social History   Tobacco Use  . Smoking status: Former Smoker    Packs/day: 2.00    Years: 15.00    Pack years: 30.00    Types: Cigarettes    Quit date: 01/11/1974    Years since quitting: 46.1  . Smokeless tobacco: Never Used  Substance Use Topics  . Alcohol use: Yes    Alcohol/week: 1.0 standard drinks    Types: 1 Standard drinks or equivalent per week    Comment: occ    Family History  Problem Relation Age of Onset  . Cirrhosis Father   . Hypertension Father   . Alcohol abuse Father   . Heart failure Mother        in 76s  . Arthritis Brother        Rheumatoid arthritis  . Hypertension Brother     Review of Systems  Constitutional: Negative for chills and fever.  Respiratory: Negative for cough and shortness of breath.   Cardiovascular: Negative for chest pain, palpitations and leg swelling.  Gastrointestinal: Negative for abdominal pain, nausea and vomiting.     OBJECTIVE:  Today's Vitals   03/02/20 0801  BP: 139/83  Pulse: 79  Temp: 97.9 F (36.6 C)  SpO2: 96%  Weight: 217 lb (98.4 kg)  Height: 5' 11"  (1.803 m)   Body mass index is 30.27 kg/m.  Wt Readings from Last 3 Encounters:    03/02/20 217 lb (98.4 kg)  12/24/19 218 lb (98.9 kg)  09/10/19 216 lb (98 kg)    Physical Exam Vitals and nursing note reviewed.  Constitutional:      Appearance: He is well-developed.  HENT:     Head: Normocephalic and atraumatic.  Eyes:     Conjunctiva/sclera: Conjunctivae normal.  Pupils: Pupils are equal, round, and reactive to light.  Cardiovascular:     Rate and Rhythm: Normal rate and regular rhythm.     Heart sounds: No murmur. No friction rub. No gallop.   Pulmonary:     Effort: Pulmonary effort is normal.     Breath sounds: Normal breath sounds. No wheezing or rales.  Musculoskeletal:     Cervical back: Neck supple.     Right lower leg: No edema.     Left lower leg: No edema.  Skin:    General: Skin is warm and dry.  Neurological:     Mental Status: He is alert and oriented to person, place, and time.     No results found for this or any previous visit (from the past 24 hour(s)).  No results found.   ASSESSMENT and PLAN  1. Prediabetes Labs pending. Continue working on L[facility] - Hemoglobin A1c  2. Mixed hyperlipidemia Diet controlled. Labs pending. Cont working on [employer] - Lipid panel - CMP14+EGFR  3. OSA on CPAP Stable. Managed by neuro/sleep  Other orders   Return in about 6 months (around 09/01/2020).    Rutherford Guys, MD [facility] at [place], [place] [address] Ph.  [phone] Fax [phone]

## 2020-03-02 NOTE — Patient Instructions (Signed)
° ° ° °  If you have lab work done today you will be contacted with your lab results within the next 2 weeks.  If you have not heard from us then please contact us. The fastest way to get your results is to register for My Chart. ° ° °IF you received an x-ray today, you will receive an invoice from [facility]. Please contact [facility] at [phone] with questions or concerns regarding your invoice.  ° °IF you received labwork today, you will receive an invoice from [facility]. Please contact [facility] at [phone] with questions or concerns regarding your invoice.  ° °Our billing staff will not be able to assist you with questions regarding bills from these companies. ° °You will be contacted with the lab results as soon as they are available. The fastest way to get your results is to activate your My Chart account. Instructions are located on the last page of this paperwork. If you have not heard from us regarding the results in 2 weeks, please contact this office. °  ° ° ° °

## 2020-03-03 LAB — HEMOGLOBIN A1C
Est. average glucose Bld gHb Est-mCnc: 117 mg/dL
Hgb A1c MFr Bld: 5.7 % — ABNORMAL HIGH (ref 4.8–5.6)

## 2020-03-03 LAB — CMP14+EGFR
ALT: 24 IU/L (ref 0–44)
AST: 25 IU/L (ref 0–40)
Albumin/Globulin Ratio: 1.8 (ref 1.2–2.2)
Albumin: 4.6 g/dL (ref 3.7–4.7)
Alkaline Phosphatase: 92 IU/L (ref 39–117)
BUN/Creatinine Ratio: 15 (ref 10–24)
BUN: 15 mg/dL (ref 8–27)
Bilirubin Total: 0.7 mg/dL (ref 0.0–1.2)
CO2: 21 mmol/L (ref 20–29)
Calcium: 9.2 mg/dL (ref 8.6–10.2)
Chloride: 102 mmol/L (ref 96–106)
Creatinine, Ser: 0.98 mg/dL (ref 0.76–1.27)
GFR calc Af Amer: 86 mL/min/{1.73_m2} (ref >59)
GFR calc non Af Amer: 74 mL/min/{1.73_m2} (ref >59)
Globulin, Total: 2.5 g/dL (ref 1.5–4.5)
Glucose: 94 mg/dL (ref 65–99)
Potassium: 4.2 mmol/L (ref 3.5–5.2)
Sodium: 140 mmol/L (ref 134–144)
Total Protein: 7.1 g/dL (ref 6.0–8.5)

## 2020-03-03 LAB — LIPID PANEL
Chol/HDL Ratio: 4.6 ratio (ref 0.0–5.0)
Cholesterol, Total: 193 mg/dL (ref 100–199)
HDL: 42 mg/dL (ref >39)
LDL Chol Calc (NIH): 120 mg/dL — ABNORMAL HIGH (ref 0–99)
Triglycerides: 178 mg/dL — ABNORMAL HIGH (ref 0–149)
VLDL Cholesterol Cal: 31 mg/dL (ref 5–40)

## 2020-06-28 ENCOUNTER — Encounter: Payer: Self-pay | Admitting: Neurology

## 2020-06-29 ENCOUNTER — Telehealth: Payer: Self-pay

## 2020-06-29 NOTE — Telephone Encounter (Signed)
Download is excellent.  Good compliance.  Currently on 19/15 centimeters of water with residual AHI is 1.7 no significant leak.  Please call patient and see what his questions are

## 2020-06-29 NOTE — Telephone Encounter (Signed)
Last seen 12-24-19, cpap report to inbox for viewing.

## 2020-06-29 NOTE — Telephone Encounter (Signed)
Pt left a VM asking for a call back to discuss his bipap pressure. He feels like it is too high. Please call.

## 2020-06-30 NOTE — Telephone Encounter (Signed)
I spoke to pt.  He states that he is down in weight from 225 to 185lbs which is great.  He states for the last 1-2 wks has noted when using cpap he wakes in night twice in that he has swallowed a lot air and has to belch it out. He states has not used the bipap in the last week, his wife has not noted any apnea.  I need to make RV when I return call. Please advise.  Pressure change??

## 2020-07-06 NOTE — Telephone Encounter (Signed)
Please get him in for revisit okay to do video visit

## 2020-07-06 NOTE — Telephone Encounter (Signed)
I called pt and made appt for him video visit for 07-07-20 at 0900.

## 2020-07-07 ENCOUNTER — Telehealth (INDEPENDENT_AMBULATORY_CARE_PROVIDER_SITE_OTHER): Payer: Medicare Other | Admitting: Adult Health

## 2020-07-07 DIAGNOSIS — G4733 Obstructive sleep apnea (adult) (pediatric): Secondary | ICD-10-CM | POA: Diagnosis not present

## 2020-07-07 NOTE — Progress Notes (Addendum)
PATIENT: Todd [name] DOB: 1940-12-28  REASON FOR VISIT: follow up HISTORY FROM: patient  Virtual Visit via Video Note  I connected with Todd [name] on 07/07/20 at  9:00 AM EDT by a video enabled telemedicine application located remotely at [facility] and verified that I am speaking with the correct person using two identifiers who was located at their own home.   I discussed the limitations of evaluation and management by telemedicine and the availability of in person appointments. The patient expressed understanding and agreed to proceed.   PATIENT: Todd [name] DOB: 04-11-1941  REASON FOR VISIT: follow up HISTORY FROM: patient  HISTORY OF PRESENT ILLNESS: Today 07/07/20:  Todd [name] is a 79 year old male with a history of obstructive sleep apnea on BiPAP.  He returns today for a follow-up.  He states that for the last week he has not been using his machine.  He states that air is going into his belly and he is uncomfortable.  Prior to the last week he was consistent with his CPAP.  His pressures currently is at 19/15 with a residual AHI is 1.9.  No significant leak.  The patient states that he is down to 185 pounds.  When he had sleep study he weighed 234 pounds.  He also notes that his wife states that in the last week she has not noticed him snoring nor having apneic events as he was previously.  He returns today for an evaluation.    REVIEW OF SYSTEMS: Out of a complete 14 system review of symptoms, the patient complains only of the following symptoms, and all other reviewed systems are negative.  See HPI  ALLERGIES: Allergies  Allergen Reactions  . Ace Inhibitors Cough  . Amoxicillin Nausea And Vomiting  . Darvon     Chest pain  . Toprol Xl [Metoprolol Tartrate]     HOME MEDICATIONS: Outpatient Medications Prior to Visit  Medication Sig Dispense Refill  . cetirizine (ZYRTEC) 10 MG tablet Take 1 tablet (10 mg total) by mouth at bedtime. 90  tablet 3  . Digestive Enzymes (PAPAYA AND ENZYMES PO) Take by mouth.    . fish oil-omega-3 fatty acids 1000 MG capsule daily. 2000 mg BID    . fluticasone (FLONASE) 50 MCG/ACT nasal spray Place 2 sprays into both nostrils daily. AS NEEDED 16 g 11  . Garlic 767 MG CAPS Take by mouth.    Marland Kitchen GARLIC PO Take 341 mg by mouth 2 (two) times daily.     . mirabegron ER (MYRBETRIQ) 25 MG TB24 tablet Take 25 mg by mouth daily.    . Misc Natural Products (GLUCOSAMINE CHOND COMPLEX/MSM PO) Take by mouth.    . Multiple Vitamin (MULTIVITAMIN) tablet Take 1 tablet by mouth daily.    . psyllium (REGULOID) 0.52 g capsule Take 0.52 g by mouth daily.    . saw palmetto 160 MG capsule Take 160 mg by mouth 2 (two) times daily.     No facility-administered medications prior to visit.    PAST MEDICAL HISTORY: Past Medical History:  Diagnosis Date  . Allergy   . Angioedema   . Arthritis   . 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  [facility].  . Bladder stone   . Cancer ([place])    skin cancer multiple; followed by [facility]/dermatology yearly.  . Cataract    4 YEARS - BILATERAL  . Colon polyps     Previous polyps on colonoscopy.  [facility].    Marland Kitchen  Hiatal hernia 10/20/2013   EGD confirmed.  [facility].  . Hyperlipemia   . Hypertension    no meds  . Neuromuscular disorder ([facility])    numbness rt hand  . Seasonal allergies   . Sleep apnea    uses CPAP nightly    PAST SURGICAL HISTORY: Past Surgical History:  Procedure Laterality Date  . BLADDER STONE REMOVAL  2011, 2018  . CARPAL TUNNEL RELEASE  01/17/2012   Procedure: CARPAL TUNNEL RELEASE;  Surgeon: Cammie Sickle., MD;  Location: [place];  Service: Orthopedics;  Laterality: Right;  . [place]  . COLONOSCOPY  10/20/2013   [facility].  Polyps.   . CYSTOSCOPY WITH LITHOLAPAXY N/A 01/09/2017   Procedure: CYSTOSCOPY WITH LITHOLAPAXY;  Surgeon: Franchot Gallo, MD;  [facility];  Service: Urology;  Laterality: N/A;    . ESOPHAGOGASTRODUODENOSCOPY  10/20/2013   [facility]; biopsy of area concernig for Barrett's; gastritis.  Marland Kitchen EYE SURGERY  2012   both cataracts  . FRACTURE SURGERY     3 fingers  . HOLMIUM LASER APPLICATION N/A 11/12/1094   Procedure: HOLMIUM LASER APPLICATION;  Surgeon: Franchot Gallo, MD;  Location[facility];  Service: Urology;  Laterality: N/A;  . QUADRICEPS TENDON REPAIR  2003   bilateral  . ULNAR NERVE TRANSPOSITION  01/17/2012   Procedure: ULNAR NERVE DECOMPRESSION/TRANSPOSITION;  Surgeon: Cammie Sickle., MD;  Location: [place];  Service: Orthopedics;  Laterality: Right;  decompression of ulnar nerve only  at right cubital tunnel  . ULNAR NERVE TRANSPOSITION Left 06/01/2017   Procedure: DECOMPRESSION ULNAR NERVE LEFT CUBITAL TUNNEL;  Surgeon: Leanora Cover, MD;  Location: [place];  Service: Orthopedics;  Laterality: Left;  . UPPER GASTROINTESTINAL ENDOSCOPY    . VASECTOMY      FAMILY HISTORY: Family History  Problem Relation Age of Onset  . Cirrhosis Father   . Hypertension Father   . Alcohol abuse Father   . Heart failure Mother        in 18s  . Arthritis Brother        Rheumatoid arthritis  . Hypertension Brother     SOCIAL HISTORY: Social History   Socioeconomic History  . Marital status: Married    Spouse name: Not on file  . Number of children: 2  . Years of education: 18  . Highest education level: Not on file  Occupational History  . Occupation: Retired  Tobacco Use  . Smoking status: Former Smoker    Packs/day: 2.00    Years: 15.00    Pack years: 30.00    Types: Cigarettes    Quit date: 01/11/1974    Years since quitting: 46.5  . Smokeless tobacco: Never Used  Vaping Use  . Vaping Use: Never used  Substance and Sexual Activity  . Alcohol use: Yes    Alcohol/week: 1.0 standard drink    Types: 1 Standard drinks or equivalent per week    Comment: occ  . Drug use: No  . Sexual activity: Never  Other  Topics Concern  . Not on file  Social History Narrative   Marital status:  Married x 42 years      Children: 2 daughters; 3 grandchildren.      Employment; retired in 1998 from [employer]; retired age 68 from [employer] after 14 years.      Lives: with wife.      Tobacco: former smoker; quit at age 48.  Smoked 15 years.      Alcohol:  On vacation or special occasions.        Education: [employer].       Exercise: Gym 3-4 times a week for 2 hours.; exercises 7 days per week.   Elliptical for one hour.      Advanced Directives: none; +desires FULL CODE.        ADLs: independent with ADLs; drives.  Does not walk with assistant devices.     Drinks 3-4 caffeine drinks a day    Social Determinants of Health   Financial Resource Strain:   . Difficulty of Paying Living Expenses: Not on file  Food Insecurity:   . Worried About Charity fundraiser in the Last Year: Not on file  . Ran Out of Food in the Last Year: Not on file  Transportation Needs:   . Lack of Transportation (Medical): Not on file  . Lack of Transportation (Non-Medical): Not on file  Physical Activity:   . Days of Exercise per Week: Not on file  . Minutes of Exercise per Session: Not on file  Stress:   . Feeling of Stress : Not on file  Social Connections:   . Frequency of Communication with Friends and Family: Not on file  . Frequency of Social Gatherings with Friends and Family: Not on file  . Attends Religious Services: Not on file  . Active Member of Clubs or Organizations: Not on file  . Attends Archivist Meetings: Not on file  . Marital Status: Not on file  Intimate Partner Violence:   . Fear of Current or Ex-Partner: Not on file  . Emotionally Abused: Not on file  . Physically Abused: Not on file  . Sexually Abused: Not on file      PHYSICAL EXAM Generalized: Well developed, in no acute distress   Neurological examination  Mentation: Alert oriented to time, place, history  taking. Follows all commands speech and language fluent Cranial nerve II-XII:Extraocular movements were full. Facial symmetry noted. uvula tongue midline. Head turning and shoulder shrug  were normal and symmetric. Motor: Good strength throughout subjectively per patient Sensory: Sensory testing is intact to soft touch on all 4 extremities subjectively per patient Coordination: Cerebellar testing reveals good finger-nose-finger  Gait and station: Patient is able to stand from a seated position. gait is normal.  Reflexes: UTA  DIAGNOSTIC DATA (LABS, IMAGING, TESTING) - I reviewed patient records, labs, notes, testing and imaging myself where available.  Lab Results  Component Value Date   WBC 6.4 05/03/2018   HGB 14.4 05/03/2018   HCT 41.2 05/03/2018   MCV 83.9 05/03/2018   PLT 226 05/03/2018      Component Value Date/Time   NA 140 03/02/2020 1023   K 4.2 03/02/2020 1023   CL 102 03/02/2020 1023   CO2 21 03/02/2020 1023   GLUCOSE 94 03/02/2020 1023   GLUCOSE 137 (H) 05/03/2018 1054   BUN 15 03/02/2020 1023   CREATININE 0.98 03/02/2020 1023   CREATININE 0.89 08/23/2016 1016   CALCIUM 9.2 03/02/2020 1023   PROT 7.1 03/02/2020 1023   ALBUMIN 4.6 03/02/2020 1023   AST 25 03/02/2020 1023   ALT 24 03/02/2020 1023   ALKPHOS 92 03/02/2020 1023   BILITOT 0.7 03/02/2020 1023   GFRNONAA 74 03/02/2020 1023   GFRAA 86 03/02/2020 1023   Lab Results  Component Value Date   CHOL 193 03/02/2020   HDL 42 03/02/2020   LDLCALC 120 (H)  03/02/2020   TRIG 178 (H) 03/02/2020   CHOLHDL 4.6 03/02/2020   Lab Results  Component Value Date   HGBA1C 5.7 (H) 03/02/2020   No results found for: [id number] Lab Results  Component Value Date   TSH 1.200 03/18/2019      ASSESSMENT AND PLAN 79 y.o. year old male  has a past medical history of Allergy, Angioedema, Arthritis, Barrett esophagus (11/07/2008), Bladder stone, Cancer ([place]), Cataract, Colon polyps, Hiatal hernia (10/20/2013),  Hyperlipemia, Hypertension, Neuromuscular disorder ([place]), Seasonal allergies, and Sleep apnea. here with:  OSA on BiPAP  . CPAP compliance excellent prior to the last week . Residual AHI is good . We will decrease his pressure to 18/14.  He is encouraged to restart using the BiPAP. Marland Kitchen Home sleep test ordered due to significant weight loss . F/U after home sleep test  I spent 20 minutes of face-to-face and non-face-to-face time with patient.  This included previsit chart review, lab review, study review, order entry, electronic health record documentation, patient education.  Ward Givens, MSN, NP-C 07/07/2020, 9:00 AM [facility] [address] [place], [place] [address] [phone]  I reviewed the above note and documentation by the Nurse Practitioner and agree with the history, exam, assessment and plan as outlined above. I was available for consultation. Star Age, MD, PhD [facility])

## 2020-07-08 ENCOUNTER — Other Ambulatory Visit: Payer: Self-pay | Admitting: Neurology

## 2020-07-08 DIAGNOSIS — G4733 Obstructive sleep apnea (adult) (pediatric): Secondary | ICD-10-CM

## 2020-08-05 ENCOUNTER — Ambulatory Visit (INDEPENDENT_AMBULATORY_CARE_PROVIDER_SITE_OTHER): Payer: Medicare Other | Admitting: Neurology

## 2020-08-05 DIAGNOSIS — G4733 Obstructive sleep apnea (adult) (pediatric): Secondary | ICD-10-CM

## 2020-08-13 ENCOUNTER — Telehealth: Payer: Self-pay

## 2020-08-13 DIAGNOSIS — G4733 Obstructive sleep apnea (adult) (pediatric): Secondary | ICD-10-CM

## 2020-08-13 NOTE — Telephone Encounter (Signed)
-----   Message from Star Age, MD sent at 08/13/2020  7:18 AM EDT ----- Patient has seen Ward Givens, NP and has been on BiPAP. He has lost a significant amount of weight, has difficulty tolerating his BiPAP at this point.  He had a home sleep test on 08/05/2020 which indicated mild residual sleep apnea.  I would like to see about switching him to an AutoPap machine.  Please advise patient that we will switch his treatment from BiPAP to AutoPap for better tolerance, he likely does not need a BiPAP and not at a high pressure any longer given his weight loss.     We will send the order to his current DME company.  The DME representative will educate him on how to use the machine, how to put the mask on, etc. I have placed an order in the chart. Please send referral, talk to patient, send report to referring MD. We will need a FU in sleep clinic for 10 weeks post-PAP set up, please arrange that with me or one of our NPs. Thanks,   Star Age, MD, PhD [facility])

## 2020-08-13 NOTE — Addendum Note (Signed)
Addended by: Star Age on: 08/13/2020 07:18 AM   Modules accepted: Orders

## 2020-08-13 NOTE — Telephone Encounter (Signed)
I called pt. I advised pt that Dr. Rexene Alberts reviewed their sleep study results and found that pt has mild osa and does not need to continue BiPAP therapy at this point. Dr. Rexene Alberts recommends that pt start an autopap at home for treatment due to the mild osa seen. I reviewed PAP compliance expectations with the pt. Pt is agreeable to starting an auto-PAP. I advised pt that an order will be sent to a DME, [facility], and [facility] will call the pt within about one week after they file with the pt's insurance. [facility] will show the pt how to use the machine, fit for masks, and troubleshoot the auto-PAP if needed. Pt is leaving to go out of town this weekend and will not be back until 09/08/2020. Pt will call the office back once he has a start date and then we weill scheduled the 31-90 day f/u.  I have sent the order to [employer] and have received confirmation that they have received the order.

## 2020-08-13 NOTE — Progress Notes (Signed)
Patient has seen Ward Givens, NP and has been on BiPAP. He has lost a significant amount of weight, has difficulty tolerating his BiPAP at this point.  He had a home sleep test on 08/05/2020 which indicated mild residual sleep apnea.  I would like to see about switching him to an AutoPap machine.  Please advise patient that we will switch his treatment from BiPAP to AutoPap for better tolerance, he likely does not need a BiPAP and not at a high pressure any longer given his weight loss.     We will send the order to his current DME company.  The DME representative will educate him on how to use the machine, how to put the mask on, etc. I have placed an order in the chart. Please send referral, talk to patient, send report to referring MD. We will need a FU in sleep clinic for 10 weeks post-PAP set up, please arrange that with me or one of our NPs. Thanks,   Star Age, MD, PhD [facility])

## 2020-08-13 NOTE — Procedures (Signed)
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  He has lost significant weight and has difficulty tolerating his BiPAP. He returns today for a re-evaluation. Summary & Diagnosis:    Mild OSA Recommendations:     This home sleep test demonstrates overall mild obstructive sleep apnea with a total AHI of 8.3/hour and O2 nadir of 86%. Given the patient's medical history and sleep related complaints, treatment with positive airway pressure is recommended. This can be achieved in the form of autoPAP trial/titration at home. A full night CPAP titration study may help with proper treatment settings and mask fitting if needed down the road.   Please note that untreated obstructive sleep apnea may carry additional perioperative morbidity. Patients with significant obstructive sleep apnea should receive perioperative PAP therapy and the surgeons and particularly the anesthesiologist should be informed of the diagnosis and the severity of the sleep disordered breathing. The patient should be cautioned not to drive, work at heights, or operate dangerous or heavy equipment when tired or sleepy. Review and reiteration of good sleep hygiene measures should be pursued with any patient. Other causes of the patient's symptoms, including circadian rhythm disturbances, an underlying mood disorder,  medication effect and/or an underlying medical problem cannot be ruled out based on this test. Clinical correlation is recommended.   The patient and his referring provider will be notified of the test results. The patient will be seen in follow up in sleep clinic at [facility].  I certify that I have reviewed the raw data recording prior to the issuance of this report in accordance with the standards of the American Academy of Sleep Medicine (AASM).  Star Age, MD, PhD [facility]) Diplomat, ABPN (Neurology and Sleep)         Sleep Summary  Oxygen Saturation Statistics   Start Study Time: End Study Time: Total Recording Time:  9:50:33 PM 6:32:59 AM   8 h, 42 min  Total Sleep Time % REM of Sleep Time:  7 h, 6 min  30.6    Mean: 94 Minimum: 86 Maximum: 98  Mean of Desaturations Nadirs (%):   90  Oxygen Desaturation. %:  4-9 10-20 >20 Total  Events Number Total   35  2 94.6 5.4  0 0.0  37 100.0  Oxygen Saturation: <90 <=88 <85 <80 <70  Duration (minutes): Sleep % 1.6 0.4 0.7 0.0 0.2 0.0 0.0 0.0 0.0 0.0     Respiratory Indices      Total Events REM NREM All Night  pRDI: pAHI 3%: ODI 4%: pAHIc 3%: % CSR: pAHI 4%:  92  59  37  9 0.0 35 17.5 12.0 9.2 2.5 11.0 6.7 3.5 1.1 13.0 8.3 5.2 1.6 4.9       Pulse Rate Statistics during Sleep (BPM)      Mean: 63 Minimum: 36  Maximum: 91    Indices are calculated using technically valid sleep time of 7 h, 6 min.             pAHI=8.3                          Mild              Moderate                    Severe                                                 5              15                    30   Body Position Statistics  Position Supine Prone Right Left Non-Supine  Sleep (min) 118.0 0.0 308.4 0.0 308.4  Sleep % 27.7 0.0 72.3 0.0 72.3  pRDI 27.0 N/A 7.6 N/A 7.6  pAHI 3% 20.9 N/A 3.5 N/A 3.5  ODI 4% 16.3 N/A 1.0 N/A 1.0         Right  Supine    Snoring  Statistics Snoring Level (dB) >40 >50 >60 >70 >80 >Threshold (45)  Sleep (min) 56.7 7.8 1.1 0.0 0.0 15.2  Sleep % 13.3 1.8 0.3 0.0 0.0 3.6    Mean: 41 dB Sleep Stages Chart                         Wake  Sleep      Wake  18.38  %    Sleep  81.62  %   Total:  100.00  %                                                       REM  Light  Deep      REM  30.58  %    Light  62.38  %    Deep  7.04  %   Total:  100.00  %                                 Sleep/Wake States  Sleep Stages  Sleep Latency (min):  REM Latency (min):  Number of Wakes:   53   40   6

## 2020-08-24 NOTE — Telephone Encounter (Signed)
I have reached out to the DME regarding Dr. Guadelupe Sabin recommendation. They will explain the insurance qualifications to the pt.  3 month f/u has been scheduled.

## 2020-08-24 NOTE — Addendum Note (Signed)
Addended by: Star Age on: 08/24/2020 12:45 PM   Modules accepted: Orders

## 2020-08-24 NOTE — Telephone Encounter (Signed)
Received this message from [employer].. Will fwd to MD to review.  Renold Genta T, RN Pt is not eligible for a new CPAP at this time.   CPAP was provided on 09/13/16 and paid in full by his insurance. BiPAP was provided 01/10/18 and also paid in full by his insurance.   Pt's BiPAP is capable of a set pressure, but not a pressure range. If we have an order for a set CPAP pressure I can make that change.   Pt is not eligible for a new CPAP until 09/13/21.

## 2020-08-24 NOTE — Telephone Encounter (Signed)
I would like for patient to start CPAP of 8 cm, it is my understanding that his current BiPAP can be set to a pressure.  Please notify patient and DME company.  Follow-up in 3 months with the nurse practitioner.

## 2020-09-01 ENCOUNTER — Ambulatory Visit: Payer: Medicare Other | Admitting: Family Medicine

## 2020-10-07 ENCOUNTER — Ambulatory Visit (INDEPENDENT_AMBULATORY_CARE_PROVIDER_SITE_OTHER): Payer: Medicare Other | Admitting: Family Medicine

## 2020-10-07 ENCOUNTER — Encounter: Payer: Self-pay | Admitting: Family Medicine

## 2020-10-07 ENCOUNTER — Other Ambulatory Visit: Payer: Self-pay

## 2020-10-07 VITALS — BP 139/83 | Ht 71.0 in | Wt 183.0 lb

## 2020-10-07 VITALS — BP 129/74 | HR 78 | Temp 98.6°F | Ht 71.0 in | Wt 190.0 lb

## 2020-10-07 DIAGNOSIS — R7303 Prediabetes: Secondary | ICD-10-CM | POA: Diagnosis not present

## 2020-10-07 DIAGNOSIS — E782 Mixed hyperlipidemia: Secondary | ICD-10-CM

## 2020-10-07 DIAGNOSIS — Z9989 Dependence on other enabling machines and devices: Secondary | ICD-10-CM

## 2020-10-07 DIAGNOSIS — Z0000 Encounter for general adult medical examination without abnormal findings: Secondary | ICD-10-CM | POA: Diagnosis not present

## 2020-10-07 DIAGNOSIS — Z1159 Encounter for screening for other viral diseases: Secondary | ICD-10-CM | POA: Diagnosis not present

## 2020-10-07 DIAGNOSIS — G4733 Obstructive sleep apnea (adult) (pediatric): Secondary | ICD-10-CM

## 2020-10-07 DIAGNOSIS — N401 Enlarged prostate with lower urinary tract symptoms: Secondary | ICD-10-CM

## 2020-10-07 DIAGNOSIS — R351 Nocturia: Secondary | ICD-10-CM

## 2020-10-07 NOTE — Progress Notes (Signed)
Subjective:  Patient ID: Todd [name], male    DOB: 03-06-1941  Age: 79 y.o. MRN: [id number]  CC:  Chief Complaint  Patient presents with  . Establish Care    PT reports he feels Great with no complaints at this time.   HPI Todd [name] presents for   Transfer of care, previous primary care provider [place].  Last office visit in April.  History of hyperlipidemia, prediabetes, OSA on CPAP, progressive osteoarthritis and hands, hips.  Chronic dysesthesias of feet improved with wearing shoes when discussed in April.  OSA on CPAP Uses CPAP nightly, previously on BiPAP, September 29 procedure visit noted.  Home sleep test indicated mild residual sleep apnea after significant weight loss.  Was switched from BiPAP to AutoPap.   Prediabetes: Has continued to lose weight, down from 217 in April to 190 today.  Has been on diet with wife - sticking with diet. Naked weight 182 at home.  Not fasting today.   Lab Results  Component Value Date   HGBA1C 5.7 (H) 03/02/2020   Wt Readings from Last 3 Encounters:  10/07/20 190 lb (86.2 kg)  10/07/20 183 lb (83 kg)  03/02/20 217 lb (98.4 kg)   BPH: With history of bladder stone, treated by urology with Myrbetriq previously.  Minimal frequency/urgency depending on intake. More nocturia - 4 times per night - appt with urology in next few weeks. No current meds as previous ones did not help. Plan on test at that time for urinary flow. Has been seen by urology in past 6 months. reportedly PSA under 1.   Hyperlipidemia: Has lost weight. No meds. Not fasting today.   Lab Results  Component Value Date   CHOL 193 03/02/2020   HDL 42 03/02/2020   LDLCALC 120 (H) 03/02/2020   TRIG 178 (H) 03/02/2020   CHOLHDL 4.6 03/02/2020   Lab Results  Component Value Date   ALT 24 03/02/2020   AST 25 03/02/2020   ALKPHOS 92 03/02/2020   BILITOT 0.7 03/02/2020    History Patient Active Problem List   Diagnosis Date Noted  . Prediabetes 03/07/2019  .  Bladder stone 03/20/2017  . 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 OSA on CPAP 02/21/2017  . Primary osteoarthritis of both hands 03/01/2016  . Mixed hyperlipidemia 03/01/2016  . Seasonal allergic rhinitis due to pollen 03/01/2016  . BMI 31.0-31.9,adult 03/01/2016  . Obesity 03/01/2016  . Angioedema 02/16/2013   Past Medical History:  Diagnosis Date  . Allergy   . Angioedema   . Arthritis   . 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  [facility].  . Bladder stone   . Cancer ([place])    skin cancer multiple; followed by [facility]/dermatology yearly.  . Cataract    4 YEARS - BILATERAL  . Colon polyps     Previous polyps on colonoscopy.  [facility].    . Hiatal hernia 10/20/2013   EGD confirmed.  [facility].  . Hyperlipemia   . Hypertension    no meds  . Neuromuscular disorder (HCC)    numbness rt hand  . Seasonal allergies   . Sleep apnea    uses CPAP nightly   Past Surgical History:  Procedure Laterality Date  . BLADDER STONE REMOVAL  2011, 2018  . CARPAL TUNNEL RELEASE  01/17/2012   Procedure: CARPAL TUNNEL RELEASE;  Surgeon: Cammie Sickle., MD;  Location: [place];  Service: Orthopedics;  Laterality: Right;  . [place]  . COLONOSCOPY  10/20/2013   [facility].  Polyps.   . CYSTOSCOPY WITH LITHOLAPAXY N/A 01/09/2017   Procedure: CYSTOSCOPY WITH LITHOLAPAXY;  Surgeon: Franchot Gallo, MD;  Location[facility];  Service: Urology;  Laterality: N/A;  . ESOPHAGOGASTRODUODENOSCOPY  10/20/2013   HH; biopsy of area concernig for Barrett's; gastritis.  Marland Kitchen EYE SURGERY  2012   both cataracts  . FRACTURE SURGERY     3 fingers  . HOLMIUM LASER APPLICATION N/A 05/14/2422   Procedure: HOLMIUM LASER APPLICATION;  Surgeon: Franchot Gallo, MD;  Location: [facility];  Service: Urology;  Laterality: N/A;  . QUADRICEPS TENDON REPAIR  2003   bilateral  . ULNAR NERVE TRANSPOSITION  01/17/2012     Procedure: ULNAR NERVE DECOMPRESSION/TRANSPOSITION;  Surgeon: Cammie Sickle., MD;  Location: [place];  Service: Orthopedics;  Laterality: Right;  decompression of ulnar nerve only  at right cubital tunnel  . ULNAR NERVE TRANSPOSITION Left 06/01/2017   Procedure: DECOMPRESSION ULNAR NERVE LEFT CUBITAL TUNNEL;  Surgeon: Leanora Cover, MD;  Location: [place];  Service: Orthopedics;  Laterality: Left;  . UPPER GASTROINTESTINAL ENDOSCOPY    . VASECTOMY     Allergies  Allergen Reactions  . Shellfish Allergy   . Ace Inhibitors Cough  . Amoxicillin Nausea And Vomiting  . Darvon     Chest pain  . Toprol Xl [Metoprolol Tartrate]    Prior to Admission medications   Medication Sig Start Date End Date Taking? Authorizing Provider  Digestive Enzymes (PAPAYA AND ENZYMES PO) Take by mouth.   Yes [provider]  fish oil-omega-3 fatty acids 1000 MG capsule daily. 2000 mg BID   Yes [provider]  Garlic 536 MG CAPS Take by mouth.    Yes [provider]  psyllium (REGULOID) 0.52 g capsule Take 0.52 g by mouth daily.    Yes [provider]  cetirizine (ZYRTEC) 10 MG tablet Take 1 tablet (10 mg total) by mouth at bedtime. Patient not taking: Reported on 10/07/2020 08/23/16   Wardell Honour, MD  fluticasone [facility]) 50 MCG/ACT nasal spray Place 2 sprays into both nostrils daily. AS NEEDED Patient not taking: Reported on 10/07/2020 08/23/16   Wardell Honour, MD  GARLIC PO Take 144 mg by mouth 2 (two) times daily.     [provider]  mirabegron ER (MYRBETRIQ) 25 MG TB24 tablet Take 25 mg by mouth daily. Patient not taking: Reported on 10/07/2020    [provider]  Misc Natural Products (GLUCOSAMINE CHOND COMPLEX/MSM PO) Take by mouth. Patient not taking: Reported on 10/07/2020    [provider]  Multiple Vitamin (MULTIVITAMIN) tablet Take 1 tablet by mouth daily.    [provider]  saw  palmetto 160 MG capsule Take 160 mg by mouth 2 (two) times daily. Patient not taking: Reported on 10/07/2020    [provider]   Social History   Socioeconomic History  . Marital status: Married    Spouse name: Not on file  . Number of children: 2  . Years of education: 41  . Highest education level: Not on file  Occupational History  . Occupation: Retired  Tobacco Use  . Smoking status: Former Smoker    Packs/day: 2.00    Years: 15.00    Pack years: 30.00    Types: Cigarettes    Quit date: 01/11/1974    Years since quitting: 46.7  .  Smokeless tobacco: Never Used  Vaping Use  . Vaping Use: Never used  Substance and Sexual Activity  . Alcohol use: Yes    Alcohol/week: 1.0 standard drink    Types: 1 Standard drinks or equivalent per week    Comment: occ  . Drug use: No  . Sexual activity: Never  Other Topics Concern  . Not on file  Social History Narrative   Marital status:  Married x 42 years      Children: 2 daughters; 3 grandchildren.      Employment; retired in 1998 from [employer]; retired age 62 from [employer] after 14 years.      Lives: with wife.      Tobacco: former smoker; quit at age 61.  Smoked 15 years.      Alcohol:  On vacation or special occasions.        Education: [employer].       Exercise: Gym 3-4 times a week for 2 hours.; exercises 7 days per week.   Elliptical for one hour.      Advanced Directives: none; +desires FULL CODE.        ADLs: independent with ADLs; drives.  Does not walk with assistant devices.     Drinks 3-4 caffeine drinks a day    Social Determinants of Health   Financial Resource Strain:   . Difficulty of Paying Living Expenses: Not on file  Food Insecurity:   . Worried About Charity fundraiser in the Last Year: Not on file  . Ran Out of Food in the Last Year: Not on file  Transportation Needs:   . Lack of Transportation (Medical): Not on file  . Lack of Transportation (Non-Medical): Not on file    Physical Activity:   . Days of Exercise per Week: Not on file  . Minutes of Exercise per Session: Not on file  Stress:   . Feeling of Stress : Not on file  Social Connections:   . Frequency of Communication with Friends and Family: Not on file  . Frequency of Social Gatherings with Friends and Family: Not on file  . Attends Religious Services: Not on file  . Active Member of Clubs or Organizations: Not on file  . Attends Archivist Meetings: Not on file  . Marital Status: Not on file  Intimate Partner Violence:   . Fear of Current or Ex-Partner: Not on file  . Emotionally Abused: Not on file  . Physically Abused: Not on file  . Sexually Abused: Not on file    Review of Systems  Constitutional: Negative for fatigue and unexpected weight change.  Eyes: Negative for visual disturbance.  Respiratory: Negative for cough, chest tightness and shortness of breath.   Cardiovascular: Negative for chest pain, palpitations and leg swelling.  Gastrointestinal: Negative for abdominal pain and blood in stool.  Neurological: Negative for dizziness, light-headedness and headaches.     Objective:   Vitals:   10/07/20 1523  BP: 129/74  Pulse: 78  Temp: 98.6 F (37 C)  TempSrc: Temporal  SpO2: 97%  Weight: 190 lb (86.2 kg)  Height: 5\' 11"  (1.803 m)     Physical Exam Vitals reviewed.     Assessment & Plan:  Todd [name] is a 79 y.o. male . Prediabetes - Plan: Comprehensive metabolic panel, Hemoglobin A1c  -Commended on weight loss, likely will be out of prediabetes range, plan for fasting lab visit next few weeks.  57-month follow-up  Encounter for  hepatitis C screening test for low risk patient - Plan: Hepatitis C antibody  Mixed hyperlipidemia - Plan: Comprehensive metabolic panel, Lipid panel  -Also anticipate improvement with weight loss, fasting lab visit planned..  OSA on CPAP  -Tolerating new device, continue same  Benign prostatic hyperplasia with  nocturia  -Unfortunately did not notice relief with previous medications, deferred new meds at this time as he has close follow-up with urology next few weeks.  No orders of the defined types were placed in this encounter.  Patient Instructions    Make sure urology send me a note of the plan at your upcoming visit.  8 hour fasting bloodwork in next 2-3 weeks.  Great work on weight loss.  Thanks for coming in today.  Take care.   If you have lab work done today you will be contacted with your lab results within the next 2 weeks.  If you have not heard from [place] then please contact us. The fastest way to get your results is to register for My Chart.   IF you received an x-ray today, you will receive an invoice from [facility]. Please contact [facility] at [phone] with questions or concerns regarding your invoice.   IF you received labwork today, you will receive an invoice from [place]. Please contact [facility] at [phone] with questions or concerns regarding your invoice.   Our billing staff will not be able to assist you with questions regarding bills from these companies.  You will be contacted with the lab results as soon as they are available. The fastest way to get your results is to activate your My Chart account. Instructions are located on the last page of this paperwork. If you have not heard from [place] regarding the results in 2 weeks, please contact this office.         Signed, Merri Ray, MD [facility]

## 2020-10-07 NOTE — Patient Instructions (Addendum)
  Make sure urology send me a note of the plan at your upcoming visit.  8 hour fasting bloodwork in next 2-3 weeks.  Great work on weight loss.  Thanks for coming in today.  Take care.   If you have lab work done today you will be contacted with your lab results within the next 2 weeks.  If you have not heard from [place] then please contact us. The fastest way to get your results is to register for My Chart.   IF you received an x-ray today, you will receive an invoice from [facility]. Please contact [facility]iology at [phone] with questions or concerns regarding your invoice.   IF you received labwork today, you will receive an invoice from [place]. Please contact [facility] at [phone] with questions or concerns regarding your invoice.   Our billing staff will not be able to assist you with questions regarding bills from these companies.  You will be contacted with the lab results as soon as they are available. The fastest way to get your results is to activate your My Chart account. Instructions are located on the last page of this paperwork. If you have not heard from [place] regarding the results in 2 weeks, please contact this office.

## 2020-10-07 NOTE — Progress Notes (Signed)
Presents today for [employer] Visit   Date of last exam: 03-02-2020  Interpreter used for this visit?no  I connected with  Despina Hick on 10/07/20 by a telephone application and verified that I am speaking with the correct person using two identifiers.   I discussed the limitations of evaluation and management by telemedicine. The patient expressed understanding and agreed to proceed.  Patient location: home  Provider location: in office  I provided 20 minutes of non face - to - face time during this encounter.   Patient Care Team: Rutherford Guys, MD (Inactive) as PCP - General (Family Medicine) Marygrace Drought, MD as Consulting Physician (Ophthalmology) Franchot Gallo, MD as Consulting Physician (Urology)   Other items to address today:   Discussed Immunizations Discussed Eye/Dental Patient has started Optavia he and his wife have lost weight and enjoy it.   Other Screening: Last screening for diabetes: 03/02/2020 Last lipid screening: 03/02/2020  ADVANCE DIRECTIVES: Discussed: no On File: yes Materials Provided: no  Immunization status:  Immunization History  Administered Date(s) Administered  . Influenza Inj Mdck Quad Pf 08/13/2019  . Influenza, High Dose Seasonal PF 07/24/2018  . Influenza,inj,Quad PF,6+ Mos 07/15/2013, 07/21/2014, 07/07/2015, 07/06/2016, 08/04/2017  . Influenza-Unspecified 08/12/2020  . PFIZER SARS-COV-2 Vaccination 12/15/2019, 01/09/2020  . Pneumococcal Conjugate-13 08/11/2014  . Pneumococcal Polysaccharide-23 03/10/2008, 08/23/2016  . Tdap 06/14/2010, 05/23/2014  . Zoster 11/07/2010  . Zoster Recombinat (Shingrix) 09/11/2017, 02/02/2018     Health Maintenance Due  Topic Date Due  . Hepatitis C Screening  Never done     Functional Status Survey: Is the patient deaf or have difficulty hearing?: Yes (bilateral hearing) Does the patient have difficulty seeing, even when wearing glasses/contacts?: No Does  the patient have difficulty concentrating, remembering, or making decisions?: No Does the patient have difficulty walking or climbing stairs?: No Does the patient have difficulty dressing or bathing?: No Does the patient have difficulty doing errands alone such as visiting a doctor's office or shopping?: No   6CIT Screen 10/07/2020 09/10/2019 09/03/2018 08/22/2017  What Year? 0 points 0 points 0 points 0 points  What month? 0 points 0 points 0 points 0 points  What time? 0 points 0 points 0 points 0 points  Count back from 20 0 points 0 points 0 points 0 points  Months in reverse 0 points 0 points 0 points 0 points  Repeat phrase 0 points 0 points 0 points 2 points  Total Score 0 0 0 2        Clinical Support from 10/07/2020 in [place] [facility] [place]  AUDIT-C Score 2       Home Environment:   Lives in a one story No trouble climbing stairs Yes grab bars No scattered rugs Adequate lighting/ no clutter   Patient Active Problem List   Diagnosis Date Noted  . Prediabetes 03/07/2019  . Bladder stone 03/20/2017  . 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 OSA on CPAP 02/21/2017  . Primary osteoarthritis of both hands 03/01/2016  . Mixed hyperlipidemia 03/01/2016  . Seasonal allergic rhinitis due to pollen 03/01/2016  . BMI 31.0-31.9,adult 03/01/2016  . Obesity 03/01/2016  . Angioedema 02/16/2013     Past Medical History:  Diagnosis Date  . Allergy   . Angioedema   . Arthritis   . 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  [facility].  . Bladder stone   . Cancer ([place])  skin cancer multiple; followed by [facility]/dermatology yearly.  . Cataract    4 YEARS - BILATERAL  . Colon polyps     Previous polyps on colonoscopy.  [facility].    . Hiatal hernia 10/20/2013   EGD confirmed.  [facility].  . Hyperlipemia   . Hypertension    no meds  . Neuromuscular disorder (HCC)    numbness rt hand  . Seasonal allergies   . Sleep apnea     uses CPAP nightly     Past Surgical History:  Procedure Laterality Date  . BLADDER STONE REMOVAL  2011, 2018  . CARPAL TUNNEL RELEASE  01/17/2012   Procedure: CARPAL TUNNEL RELEASE;  Surgeon: Cammie Sickle., MD;  Location: [place];  Service: Orthopedics;  Laterality: Right;  . [place]  . COLONOSCOPY  10/20/2013   [facility].  Polyps.   . CYSTOSCOPY WITH LITHOLAPAXY N/A 01/09/2017   Procedure: CYSTOSCOPY WITH LITHOLAPAXY;  Surgeon: Franchot Gallo, MD;  Location: [facility];  Service: Urology;  Laterality: N/A;  . ESOPHAGOGASTRODUODENOSCOPY  10/20/2013   HH; biopsy of area concernig for Barrett's; gastritis.  Marland Kitchen EYE SURGERY  2012   both cataracts  . FRACTURE SURGERY     3 fingers  . HOLMIUM LASER APPLICATION N/A 07/10/4267   Procedure: HOLMIUM LASER APPLICATION;  Surgeon: Franchot Gallo, MD;  [facility];  Service: Urology;  Laterality: N/A;  . QUADRICEPS TENDON REPAIR  2003   bilateral  . ULNAR NERVE TRANSPOSITION  01/17/2012   Procedure: ULNAR NERVE DECOMPRESSION/TRANSPOSITION;  Surgeon: Cammie Sickle., MD;  Location: [place];  Service: Orthopedics;  Laterality: Right;  decompression of ulnar nerve only  at right cubital tunnel  . ULNAR NERVE TRANSPOSITION Left 06/01/2017   Procedure: DECOMPRESSION ULNAR NERVE LEFT CUBITAL TUNNEL;  Surgeon: Leanora Cover, MD;  Location: [place];  Service: Orthopedics;  Laterality: Left;  . UPPER GASTROINTESTINAL ENDOSCOPY    . VASECTOMY       Family History  Problem Relation Age of Onset  . Cirrhosis Father   . Hypertension Father   . Alcohol abuse Father   . Heart failure Mother        in 48s  . Arthritis Brother        Rheumatoid arthritis  . Hypertension Brother      Social History   Socioeconomic History  . Marital status: Married    Spouse name: Not on file  . Number of children: 2  . Years of education: 36  .  Highest education level: Not on file  Occupational History  . Occupation: Retired  Tobacco Use  . Smoking status: Former Smoker    Packs/day: 2.00    Years: 15.00    Pack years: 30.00    Types: Cigarettes    Quit date: 01/11/1974    Years since quitting: 46.7  . Smokeless tobacco: Never Used  Vaping Use  . Vaping Use: Never used  Substance and Sexual Activity  . Alcohol use: Yes    Alcohol/week: 1.0 standard drink    Types: 1 Standard drinks or equivalent per week    Comment: occ  . Drug use: No  . Sexual activity: Never  Other Topics Concern  . Not on file  Social History Narrative   Marital status:  Married x 42 years      Children: 2 daughters; 3 grandchildren.      Employment; retired in 1998 from [employer];  retired age 38 from [employer] after 14 years.      Lives: with wife.      Tobacco: former smoker; quit at age 40.  Smoked 15 years.      Alcohol:  On vacation or special occasions.        Education: [employer].       Exercise: Gym 3-4 times a week for 2 hours.; exercises 7 days per week.   Elliptical for one hour.      Advanced Directives: none; +desires FULL CODE.        ADLs: independent with ADLs; drives.  Does not walk with assistant devices.     Drinks 3-4 caffeine drinks a day    Social Determinants of Health   Financial Resource Strain:   . Difficulty of Paying Living Expenses: Not on file  Food Insecurity:   . Worried About Charity fundraiser in the Last Year: Not on file  . Ran Out of Food in the Last Year: Not on file  Transportation Needs:   . Lack of Transportation (Medical): Not on file  . Lack of Transportation (Non-Medical): Not on file  Physical Activity:   . Days of Exercise per Week: Not on file  . Minutes of Exercise per Session: Not on file  Stress:   . Feeling of Stress : Not on file  Social Connections:   . Frequency of Communication with Friends and Family: Not on file  . Frequency of Social Gatherings with  Friends and Family: Not on file  . Attends Religious Services: Not on file  . Active Member of Clubs or Organizations: Not on file  . Attends Archivist Meetings: Not on file  . Marital Status: Not on file  Intimate Partner Violence:   . Fear of Current or Ex-Partner: Not on file  . Emotionally Abused: Not on file  . Physically Abused: Not on file  . Sexually Abused: Not on file     Allergies  Allergen Reactions  . Shellfish Allergy   . Ace Inhibitors Cough  . Amoxicillin Nausea And Vomiting  . Darvon     Chest pain  . Toprol Xl [Metoprolol Tartrate]      Prior to Admission medications   Medication Sig Start Date End Date Taking? Authorizing Provider  Digestive Enzymes (PAPAYA AND ENZYMES PO) Take by mouth.   Yes [provider]  fish oil-omega-3 fatty acids 1000 MG capsule daily. 2000 mg BID   Yes [provider]  GARLIC PO Take 604 mg by mouth 2 (two) times daily.    Yes [provider]  Multiple Vitamin (MULTIVITAMIN) tablet Take 1 tablet by mouth daily.   Yes [provider]  cetirizine (ZYRTEC) 10 MG tablet Take 1 tablet (10 mg total) by mouth at bedtime. Patient not taking: Reported on 10/07/2020 08/23/16   Wardell Honour, MD  fluticasone [facility]) 50 MCG/ACT nasal spray Place 2 sprays into both nostrils daily. AS NEEDED Patient not taking: Reported on 10/07/2020 08/23/16   Wardell Honour, MD  Garlic 540 MG CAPS Take by mouth. Patient not taking: Reported on 10/07/2020    [provider]  mirabegron ER (MYRBETRIQ) 25 MG TB24 tablet Take 25 mg by mouth daily. Patient not taking: Reported on 10/07/2020    [provider]  Misc Natural Products (GLUCOSAMINE CHOND COMPLEX/MSM PO) Take by mouth. Patient not taking: Reported on 10/07/2020    [provider]  psyllium (REGULOID) 0.52 g capsule Take  0.52 g by mouth daily. Patient not taking: Reported on 10/07/2020    [provider]  saw palmetto 160  MG capsule Take 160 mg by mouth 2 (two) times daily. Patient not taking: Reported on 10/07/2020    [provider]     Depression screen [facility] 2/9 10/07/2020 03/02/2020 09/10/2019 09/02/2019 04/03/2019  Decreased Interest 0 0 0 0 0  Down, Depressed, Hopeless 0 0 0 0 0  PHQ - 2 Score 0 0 0 0 0     Fall Risk  10/07/2020 03/02/2020 09/10/2019 09/02/2019 04/03/2019  Falls in the past year? 0 0 0 0 0  Number falls in past yr: 0 0 0 0 0  Injury with Fall? 0 0 0 0 1  Comment - - - - -  Risk for fall due to : - - History of fall(s) - -  Follow up Falls evaluation completed;Education provided Falls evaluation completed Falls evaluation completed Falls evaluation completed -      PHYSICAL EXAM: BP 139/83 Comment: not in clinic/ taken from a previous visit  Ht 5\' 11"  (1.803 m)   Wt 183 lb (83 kg) Comment: per patient  BMI 25.52 kg/m    Wt Readings from Last 3 Encounters:  10/07/20 183 lb (83 kg)  03/02/20 217 lb (98.4 kg)  12/24/19 218 lb (98.9 kg)      Education/Counseling provided regarding diet and exercise, prevention of chronic diseases, smoking/tobacco cessation, if applicable, and reviewed "Covered Medicare Preventive Services."

## 2020-10-07 NOTE — Patient Instructions (Addendum)
Thank you for taking time to come for your Medicare Wellness Visit. I appreciate your ongoing commitment to your health goals. Please review the following plan we discussed and let me know if I can assist you in the future.  Leroy Kennedy LPN  Preventive Care 77 Years and Older, Male Preventive care refers to lifestyle choices and visits with your health care provider that can promote health and wellness. This includes:  A yearly physical exam. This is also called an annual well check.  Regular dental and eye exams.  Immunizations.  Screening for certain conditions.  Healthy lifestyle choices, such as diet and exercise. What can I expect for my preventive care visit? Physical exam Your health care provider will check:  Height and weight. These may be used to calculate body mass index (BMI), which is a measurement that tells if you are at a healthy weight.  Heart rate and blood pressure.  Your skin for abnormal spots. Counseling Your health care provider may ask you questions about:  Alcohol, tobacco, and drug use.  Emotional well-being.  Home and relationship well-being.  Sexual activity.  Eating habits.  History of falls.  Memory and ability to understand (cognition).  Work and work Statistician. What immunizations do I need?  Influenza (flu) vaccine  This is recommended every year. Tetanus, diphtheria, and pertussis (Tdap) vaccine  You may need a Td booster every 10 years. Varicella (chickenpox) vaccine  You may need this vaccine if you have not already been vaccinated. Zoster (shingles) vaccine  You may need this after age 66. Pneumococcal conjugate (PCV13) vaccine  One dose is recommended after age 84. Pneumococcal polysaccharide (PPSV23) vaccine  One dose is recommended after age 88. Measles, mumps, and rubella (MMR) vaccine  You may need at least one dose of MMR if you were born in 1957 or later. You may also need a second dose. Meningococcal  conjugate (MenACWY) vaccine  You may need this if you have certain conditions. Hepatitis A vaccine  You may need this if you have certain conditions or if you travel or work in places where you may be exposed to hepatitis A. Hepatitis B vaccine  You may need this if you have certain conditions or if you travel or work in places where you may be exposed to hepatitis B. Haemophilus influenzae type b (Hib) vaccine  You may need this if you have certain conditions. You may receive vaccines as individual doses or as more than one vaccine together in one shot (combination vaccines). Talk with your health care provider about the risks and benefits of combination vaccines. What tests do I need? Blood tests  Lipid and cholesterol levels. These may be checked every 5 years, or more frequently depending on your overall health.  Hepatitis C test.  Hepatitis B test. Screening  Lung cancer screening. You may have this screening every year starting at age 24 if you have a 30-pack-year history of smoking and currently smoke or have quit within the past 15 years.  Colorectal cancer screening. All adults should have this screening starting at age 52 and continuing until age 61. Your health care provider may recommend screening at age 57 if you are at increased risk. You will have tests every 1-10 years, depending on your results and the type of screening test.  Prostate cancer screening. Recommendations will vary depending on your family history and other risks.  Diabetes screening. This is done by checking your blood sugar (glucose) after you have not eaten for  a while (fasting). You may have this done every 1-3 years.  Abdominal aortic aneurysm (AAA) screening. You may need this if you are a current or former smoker.  Sexually transmitted disease (STD) testing. Follow these instructions at home: Eating and drinking  Eat a diet that includes fresh fruits and vegetables, whole grains, lean  protein, and low-fat dairy products. Limit your intake of foods with high amounts of sugar, saturated fats, and salt.  Take vitamin and mineral supplements as recommended by your health care provider.  Do not drink alcohol if your health care provider tells you not to drink.  If you drink alcohol: ? Limit how much you have to 0-2 drinks a day. ? Be aware of how much alcohol is in your drink. In the [place] one drink equals one 12 oz bottle of beer (355 mL), one 5 oz glass of wine (148 mL), or one 1 oz glass of hard liquor (44 mL). Lifestyle  Take daily care of your teeth and gums.  Stay active. Exercise for at least 30 minutes on 5 or more days each week.  Do not use any products that contain nicotine or tobacco, such as cigarettes, e-cigarettes, and chewing tobacco. If you need help quitting, ask your health care provider.  If you are sexually active, practice safe sex. Use a condom or other form of protection to prevent STIs (sexually transmitted infections).  Talk with your health care provider about taking a low-dose aspirin or statin. What's next?  Visit your health care provider once a year for a well check visit.  Ask your health care provider how often you should have your eyes and teeth checked.  Stay up to date on all vaccines. This information is not intended to replace advice given to you by your health care provider. Make sure you discuss any questions you have with your health care provider. Document Revised: 10/18/2018 Document Reviewed: 10/18/2018 Elsevier Patient Education  2020 Elsevier Inc.  

## 2020-10-26 ENCOUNTER — Ambulatory Visit: Payer: Medicare Other

## 2020-10-26 ENCOUNTER — Other Ambulatory Visit: Payer: Self-pay

## 2020-10-26 DIAGNOSIS — Z1159 Encounter for screening for other viral diseases: Secondary | ICD-10-CM

## 2020-10-26 DIAGNOSIS — E782 Mixed hyperlipidemia: Secondary | ICD-10-CM

## 2020-10-26 DIAGNOSIS — R7303 Prediabetes: Secondary | ICD-10-CM

## 2020-10-27 LAB — COMPREHENSIVE METABOLIC PANEL
ALT: 21 IU/L (ref 0–44)
AST: 22 IU/L (ref 0–40)
Albumin/Globulin Ratio: 2 (ref 1.2–2.2)
Albumin: 4.8 g/dL — ABNORMAL HIGH (ref 3.7–4.7)
Alkaline Phosphatase: 115 IU/L (ref 44–121)
BUN/Creatinine Ratio: 16 (ref 10–24)
BUN: 15 mg/dL (ref 8–27)
Bilirubin Total: 0.8 mg/dL (ref 0.0–1.2)
CO2: 27 mmol/L (ref 20–29)
Calcium: 9.3 mg/dL (ref 8.6–10.2)
Chloride: 99 mmol/L (ref 96–106)
Creatinine, Ser: 0.93 mg/dL (ref 0.76–1.27)
GFR calc Af Amer: 91 mL/min/{1.73_m2} (ref >59)
GFR calc non Af Amer: 79 mL/min/{1.73_m2} (ref >59)
Globulin, Total: 2.4 g/dL (ref 1.5–4.5)
Glucose: 90 mg/dL (ref 65–99)
Potassium: 4.7 mmol/L (ref 3.5–5.2)
Sodium: 137 mmol/L (ref 134–144)
Total Protein: 7.2 g/dL (ref 6.0–8.5)

## 2020-10-27 LAB — LIPID PANEL
Chol/HDL Ratio: 3.7 ratio (ref 0.0–5.0)
Cholesterol, Total: 195 mg/dL (ref 100–199)
HDL: 53 mg/dL (ref >39)
LDL Chol Calc (NIH): 123 mg/dL — ABNORMAL HIGH (ref 0–99)
Triglycerides: 106 mg/dL (ref 0–149)
VLDL Cholesterol Cal: 19 mg/dL (ref 5–40)

## 2020-10-27 LAB — HEMOGLOBIN A1C
Est. average glucose Bld gHb Est-mCnc: 111 mg/dL
Hgb A1c MFr Bld: 5.5 % (ref 4.8–5.6)

## 2020-10-27 LAB — HEPATITIS C ANTIBODY: Hep C Virus Ab: <0.1 s/co ratio (ref 0.0–0.9)

## 2020-11-30 ENCOUNTER — Ambulatory Visit: Payer: Self-pay | Admitting: Neurology

## 2021-01-06 ENCOUNTER — Other Ambulatory Visit: Payer: Self-pay

## 2021-01-06 ENCOUNTER — Encounter: Payer: Self-pay | Admitting: Neurology

## 2021-01-06 ENCOUNTER — Ambulatory Visit (INDEPENDENT_AMBULATORY_CARE_PROVIDER_SITE_OTHER): Payer: Medicare Other | Admitting: Neurology

## 2021-01-06 VITALS — BP 134/77 | HR 73 | Ht 71.0 in | Wt 199.0 lb

## 2021-01-06 DIAGNOSIS — Z9989 Dependence on other enabling machines and devices: Secondary | ICD-10-CM

## 2021-01-06 DIAGNOSIS — G4733 Obstructive sleep apnea (adult) (pediatric): Secondary | ICD-10-CM

## 2021-01-06 NOTE — Patient Instructions (Signed)
It was nice to see you again today.  You are fully compliant with your CPAP.  As discussed, your apnea score is high and this means that your CPAP pressure may need to be increased, I would like to see how you do on a pressure of 10 cm.  We will make a change either today through the online portal or ask [facility] to make the change remotely.  Please follow-up to see one of our nurse practitioners, Megan or Amy, in 3 to 4 months and call us anytime if you have any questions or concerns with the pressure change.  I am glad to hear that you are doing well overall.

## 2021-01-06 NOTE — Progress Notes (Signed)
Subjective:    [name] ID: Todd [name] is a 80 y.o. male.  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 Todd [name] is unaccompanied today. I last saw Todd [name] on 03/08/18, at which time Todd [name] was compliant with BiPAP and doing well.   Todd [name] saw Ward Givens, NP on 09/10/18, at which time Todd [name] was doing well with Todd [name] BiPAP.   Todd [name] saw Ward Givens, NP on 12/24/19, at which time Todd [name] was compliant, residual AHI was around 12/hour. Todd [name] BiPAP was increased to 19/15 cm.   Todd [name] had a virtual visit with Ward Givens on 07/07/20, at which time Todd [name] reported difficulty tolerating Todd BiPAP, Todd [name] had not used it in about a week.  Todd [name] also reported significant weight loss.  A home sleep test was ordered for reevaluation.    Todd [name] had an interim home sleep test on 08/05/2020 which indicated mild residual sleep apnea with an AHI of 8.3/h, O2 nadir of 86%.  Todd [name] was encouraged to try AutoPap therapy instead of BiPAP.  Todd [name] was unable to start a new AutoPap machine, as Todd [name] was not eligible yet.  Todd [name] was advised to start a set pressure of 8 cm.   Today, 01/06/2021: I reviewed Todd [name] CPAP compliance data from 12/06/2020 through 01/04/2021, which is a total of 30 days, during which time Todd [name] used Todd [name] machine every night with percent use days greater than 4 hours at 100%, indicating superb compliance with average usage of 7 hours and 39 minutes, residual AHI highly elevated at 31.9/h, residual events appear to be obstructive in nature, pressure at 8 cm, leak acceptable with a 95th percentile at 20.9 L/min. Todd [name]  reports doing well, Todd [name] tolerates Todd pressure so much better than Todd high pressure BiPAP, Todd [name] eventually gave up using Todd BiPAP.  Todd [name] feels like Todd [name] is sleeping quite well.  Todd [name] continues to use a full facemask and sometimes with position change Todd [name] does notice Todd leak.  Todd [name] is motivated to continue with treatment.  Todd [name] was not aware that Todd [name] AHI score was high.  Todd [name] does report that when Todd [name] stopped using Todd [name] BiPAP and slept without treatment, Todd [name] slept fairly well, Todd [name] wife noticed less snoring overall compared to Todd past.  Todd [name] had a recent checkup with Todd [name] primary care, Todd [name] is doing well.  Todd [name] tries to stay active, does some resistance training and aerobic exercises, uses a pedaling device at home.   Todd [name]'s allergies, current medications, family history, past medical history, past social history, past surgical history and problem list were reviewed and updated as appropriate.    Previously (copied from previous notes for reference):   I saw Todd [name] on 04/11/2017, at which time Todd [name] was on CPAP of 13 cm with full compliance. Todd [name] had an increased AHI of 21.7 per hour on a pressure of 13 cm. I suggested we increase Todd [name] pressure to 15 cm at Todd time. Todd [name] was seen in follow-up by Ward Givens on 10/09/2017, at which time Todd [name] was compliant with Todd [name] CPAP of 15 cm but had significant residual sleep disordered breathing. I suggested Todd [name] return  for a full night read titration study. Todd [name] had a CPAP titration study on 12/17/2017. I went over Todd [name] test results with Todd [name] in detail today. CPAP was initiated at 6 cm and titrated to 13 cm. Todd [name] AHI was elevated on 13 cm and Todd [name] was therefore switched to BiPAP therapy at 14/10 and further titrated to 17/13. Todd [name] had a reduced sleep efficiency at 55.3%, sleep latency was 24 minutes and REM latency was delayed at 139 minutes. Todd [name] had an increased percentage of slow-wave sleep and a decreased percentage of REM sleep. On Todd final pressure of 17/13 Todd [name] AHI was still elevated at 25.7, O2 nadir was  92%, non-REM sleep was achieved. I recommended a home treatment pressure of 18/14.    I reviewed Todd [name] BiPAP compliance data from 02/03/2018 through 03/04/2018, which is a total of 30 days, during which time Todd [name] used Todd [name] BiPAP every night with percent used days greater than 4 hours at 100%, indicating superb compliance with an average usage of 8 hours and 2 minutes, residual AHI improved at 12.5 per hour, leak low with Todd 95th percentile at 1.6 L/m on a pressure of 18/14.  I saw Todd [name] on 11/28/2016, at which time Todd [name] reported doing well with CPAP therapy, had adjusted well to treatment and was fully compliant. Due to Todd [name] residual AHI of 28.7 per hour at Todd time I suggested we increase Todd [name] CPAP pressure from 8 cm to 10 cm. Todd [name] had an interim appointment with Vaughan Browner on 01/26/2017, at which time Todd [name] was again compliant with CPAP therapy but residual AHI was still suboptimal at over 30 per hour. Symptoms were mostly obstructive in nature and I suggested we increase Todd [name] pressure to 13 cm.    I reviewed Todd [name] CPAP compliance data from 03/11/2017 through 04/09/2017 which is a total of 30 days, during which time Todd [name] used Todd [name] machine 100%, average usage of 7 hours and 38 minutes, residual AHI improved but still elevated at 21.7 per hour, Mostly obstructive in nature, leak acceptable with Todd 95th percentile at 14.2 L/m on a pressure of 13 cm with EPR of 3. Todd [name] reports doing well, tolerated Todd increase in pressure. Has had some numbness in Todd pinky fingers b/l, has a Hx of degenerative cervical spine d/s, s/p surgery in 78. Some R knee discomfort.  Using a Lg FFM, some leak, but overall okay, still feels Todd [name] has done quite well with using CPAP in Todd sense that Todd [name] feels improved, daytime energy better, headaches improved.   I first met Todd [name] on 08/09/2016 at Todd request of Todd [name] primary care physician, at which time Todd [name] reported snoring and excessive daytime somnolence as well as worsening nocturia. I invited Todd [name] for sleep  study. Todd [name] had a split-night sleep study on 08/21/2016. We went over Todd [name] test results today. Baseline sleep efficiency was markedly reduced at 50.8%, wake after sleep onset was 83 minutes, REM latency was normal, sleep latency delayed. Total AHI was 28.8 per hour, REM AHI was 67.5 per hour, O2 nadir was 83%.   Todd [name] was titrated on CPAP from 5 cm to 8 cm, AHI was 0 per hour on a pressure of 8 cm. Todd [name] had severe PLMS during Todd baseline and Todd CPAP titration portion of Todd study with minimal to mild arousals.   I reviewed Todd [name] CPAP compliance data from 10/29/2016 through 11/27/2016, which is a total of 30 days, during which time Todd [name] used Todd [name] CPAP every night with percent used days greater than 4  hours at 100%, indicating superb compliance with an average usage of 8 hours and 11 minutes, residual AHI elevated at 28.7 per hour, mostly obstructive in nature, leak low with Todd 95th percentile at 7.2 L/m on a pressure of 8 cm with EPR of 3.   08/21/2016: Todd [name] reports snoring and excessive daytime somnolence. Todd [name] Epworth sleepiness score is 14 out of 24 today. Fatigue score is 15 out of 63. Snoring can be loud. Todd [name] denies restless leg symptoms. Todd [name] has had nocturia for years, worse in Todd past year with 3-4 bathroom visits per night. Todd [name] has seen urologist for this was advised that Todd [name] prostate looked fine. Todd [name] quit smoking in 1973, drinks alcohol occasionally, and drinks about 3 cups of coffee per day but typically no sodas and decaf tea only. I reviewed your office note from 07/06/2016. Todd [name] retired from Todd [employer] in 39 and then did 14 years for Todd [employer]. [employer], retired in 2012. Todd [name] tries to exercise regularly, and tries to drink enough water. Todd [name] has gained weight in Todd last few years, especially after Todd [name] retirement.  Todd [name] does not watch TV in bed, lives with Todd [name] wife, has 2 grown daughters, one in [place] and Todd other in [place], [place]. Todd [name] suspects that Todd [name] father may have had obstructive sleep apnea. Todd [name] tries to  keep a scheduled for Todd [name] sleep time and wake up time. They have no pets at this time. Todd [name] likes to travel since Todd [name] retirement.  Todd [name] Past Medical History Is Significant For: Past Medical History:  Diagnosis Date  . Allergy   . Angioedema   . Arthritis   . 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  [facility].  . Bladder stone   . Cancer ([place])    skin cancer multiple; followed by [facility]/dermatology yearly.  . Cataract    4 YEARS - BILATERAL  . Colon polyps     Previous polyps on colonoscopy.  [facility].    . Hiatal hernia 10/20/2013   EGD confirmed.  [facility].  . Hyperlipemia   . Hypertension    no meds  . Neuromuscular disorder ([facility])    numbness rt hand  . Seasonal allergies   . Sleep apnea    uses CPAP nightly    Todd [name] Past Surgical History Is Significant For: Past Surgical History:  Procedure Laterality Date  . BLADDER STONE REMOVAL  2011, 2018  . CARPAL TUNNEL RELEASE  01/17/2012   Procedure: CARPAL TUNNEL RELEASE;  Surgeon: Cammie Sickle., MD;  Location: [facility];  Service: Orthopedics;  Laterality: Right;  . [place]  . COLONOSCOPY  10/20/2013   [facility].  Polyps.   . CYSTOSCOPY WITH LITHOLAPAXY N/A 01/09/2017   Procedure: CYSTOSCOPY WITH LITHOLAPAXY;  Surgeon: Franchot Gallo, MD;  Location: [facility];  Service: Urology;  Laterality: N/A;  . ESOPHAGOGASTRODUODENOSCOPY  10/20/2013   [facility]; biopsy of area concernig for Barrett's; gastritis.  Marland Kitchen EYE SURGERY  2012   both cataracts  . FRACTURE SURGERY     3 fingers  . HOLMIUM LASER APPLICATION N/A 12/11/5359   Procedure: HOLMIUM LASER APPLICATION;  Surgeon: Franchot Gallo, MD;  Location: [facility];  Service: Urology;  Laterality: N/A;  . QUADRICEPS TENDON REPAIR  2003   bilateral  . ULNAR NERVE TRANSPOSITION  01/17/2012   Procedure: ULNAR NERVE DECOMPRESSION/TRANSPOSITION;  Surgeon: Cammie Sickle., MD;  Location: [facility];  Service: Orthopedics;   Laterality: Right;  decompression  of ulnar nerve only  at right cubital tunnel  . ULNAR NERVE TRANSPOSITION Left 06/01/2017   Procedure: DECOMPRESSION ULNAR NERVE LEFT CUBITAL TUNNEL;  Surgeon: Leanora Cover, MD;  Location: [facility];  Service: Orthopedics;  Laterality: Left;  . UPPER GASTROINTESTINAL ENDOSCOPY    . VASECTOMY      Todd [name] Family History Is Significant For: Family History  Problem Relation Age of Onset  . Cirrhosis Father   . Hypertension Father   . Alcohol abuse Father   . Heart failure Mother        in 30s  . Arthritis Brother        Rheumatoid arthritis  . Hypertension Brother     Todd [name] Social History Is Significant For: Social History   Socioeconomic History  . Marital status: Married    Spouse name: Not on file  . Number of children: 2  . Years of education: 40  . Highest education level: Not on file  Occupational History  . Occupation: Retired  Tobacco Use  . Smoking status: Former Smoker    Packs/day: 2.00    Years: 15.00    Pack years: 30.00    Types: Cigarettes    Quit date: 01/11/1974    Years since quitting: 47.0  . Smokeless tobacco: Never Used  Vaping Use  . Vaping Use: Never used  Substance and Sexual Activity  . Alcohol use: Yes    Alcohol/week: 1.0 standard drink    Types: 1 Standard drinks or equivalent per week    Comment: occ  . Drug use: No  . Sexual activity: Never  Other Topics Concern  . Not on file  Social History Narrative   Marital status:  Married x 42 years      Children: 2 daughters; 3 grandchildren.      Employment; retired in 1998 from [employer]; retired age 7 from [employer] after 14 years.      Lives: with wife.      Tobacco: former smoker; quit at age 53.  Smoked 15 years.      Alcohol:  On vacation or special occasions.        Education: [employer].       Exercise: Gym 3-4 times a week for 2 hours.; exercises 7 days per week.   Elliptical for one hour.      Advanced Directives:  none; +desires FULL CODE.        ADLs: independent with ADLs; drives.  Does not walk with assistant devices.     Drinks 3-4 caffeine drinks a day    Social Determinants of Radio broadcast assistant Strain: Not on file  Food Insecurity: Not on file  Transportation Needs: Not on file  Physical Activity: Not on file  Stress: Not on file  Social Connections: Not on file    Todd [name] Allergies Are:  Allergies  Allergen Reactions  . Shellfish Allergy   . Ace Inhibitors Cough  . Amoxicillin Nausea And Vomiting  . Darvon     Chest pain  . Toprol Xl [Metoprolol Tartrate]   :   Todd [name] Current Medications Are:  Outpatient Encounter Medications as of 01/06/2021  Medication Sig  . Digestive Enzymes (PAPAYA AND ENZYMES PO) Take by mouth.  . fish oil-omega-3 fatty acids 1000 MG capsule daily. 2000 mg BID  . Garlic 315 MG CAPS Take by mouth.   Marland Kitchen GARLIC PO Take 176 mg by mouth 2 (two) times daily.   . Multiple Vitamin (MULTIVITAMIN)  tablet Take 1 tablet by mouth daily.  . [DISCONTINUED] psyllium (REGULOID) 0.52 g capsule Take 0.52 g by mouth daily.    No facility-administered encounter medications on file as of 01/06/2021.  :  Review of Systems:  Out of a complete 14 point review of systems, all are reviewed and negative with Todd exception of these symptoms as listed below: Review of Systems  Neurological:       RM 1, alone. 3 month follow up. DME: [employer]. Reprogrammed bipap machine and tolerating well. Sleeping well. Gets up in Todd middle of Todd night d/t having to urinate. Has follow up with [facility] at Todd end of Todd month for this with Lillette Boxer. Dahlstedt, M.D.    Objective:  Neurological Exam  Physical Exam Physical Examination:   Vitals:   01/06/21 0717  BP: 134/77  Pulse: 73  SpO2: 98%    General Examination: Todd [name] is a very pleasant 80 y.o. male in no acute distress. Todd [name] appears well-developed and well-nourished and well groomed.   HEENT:Normocephalic,  atraumatic, pupils are equal, round and reactive to light, status post cataract repairs.  Extraocular tracking is well preserved, hearing grossly intact, hearing aids.  Face is symmetric with normal facial animation, speech is clear without dysarthria, hypophonia or voice tremor.  Neck is supple with full range of motion, no carotid bruits.  Airway examination reveals mild mouth dryness, stable findings with moderate airway crowding, tongue protrudes centrally and palate elevates symmetrically.    Chest:Clear to auscultation without wheezing, rhonchi or crackles noted.  Heart:S1+S2+0, regular and normal without murmurs, rubs or gallops noted.   Abdomen:Soft, non-tender and non-distended.  Extremities:There is no pitting edema in Todd distal lower extremities bilaterally.   Skin: Warm and dry without trophic changes noted. There are no varicose veins. Chronic sun exposure type changes esp. On forearms.  Musculoskeletal: exam reveals no obvious joint deformities, denies any joint pain with mobility.  Neurologically:  Mental status: Todd [name] is awake, alert and oriented in all 4 spheres. Todd [name] immediate and remote memory, attention, language skills and fund of knowledge are appropriate. There is no evidence of aphasia, agnosia, apraxia or anomia. Speech is clear with normal prosody and enunciation. Thought process is linear. Mood is normal and affect is normal.  Cranial nerves II - XII are as described above under HEENT exam. Motor exam: Normal bulk, strength and tone is noted. There is no tremor. Fine motor skills and coordination: grossly intact.  Cerebellar testing: No dysmetria or intention tremor.There is no truncal or gait ataxia.  Sensory exam: intact to light touch inthe upper and lower extremities.  Gait, station and balance: Todd [name] stands easily. No veering to one side is noted. No leaning to one side is noted. Posture is age-appropriate and stance is narrow based. Gait shows  normal stride length and normal pace. No problems turning are noted.   Assessment and plan:  In summary, DUELL HOLDREN is a very pleasant 80 year old male with an underlying medical history of hypertension, history of arthritis, s/p b/l knee surgeries in 2003, skin cancer, cataract, status post repairs, hiatal hernia, hyperlipidemia, history of angioedema,status post neck surgery,and obesity, whopresents for follow-up consultation of Todd [name] obstructive sleep apnea.  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  Todd [name] had been able to lose weight.  We rechecked with a home sleep test in September 2021 and this  indicated mild residual sleep apnea.  We switched Todd [name] from BiPAP to CPAP of 8.  Todd [name] is fully compliant with treatment and tolerates it well, feels that Todd [name] is sleeping well but Todd [name] AHI is elevated in Todd high range.  Todd [name] is advised to continue with CPAP but we will try to increase Todd pressure to 10 cm at this time and see if it helps.  Todd [name] is willing to try this.  Todd [name] is advised to monitor Todd [name] symptoms and see if Todd [name] has any issues tolerating Todd CPAP of 10 cm.  Todd [name] is encouraged to call or email through [place] anytime with any questions or concerns and routinely follow-up to see Ward Givens, nurse practitioner in 3 to 4 months, sooner if needed. I answered all Todd [name] questions today and Todd [name] was in agreement.

## 2021-02-03 ENCOUNTER — Other Ambulatory Visit: Payer: Self-pay | Admitting: Urology

## 2021-03-03 ENCOUNTER — Encounter (HOSPITAL_BASED_OUTPATIENT_CLINIC_OR_DEPARTMENT_OTHER): Payer: Self-pay | Admitting: Urology

## 2021-03-03 NOTE — Progress Notes (Addendum)
Spoke w/ via phone for pre-op interview---pt Lab needs dos----  ower             Lab results-----echo 03-15-2013 epic Home sleep test 08-05-2020 epic- COVID test ------03-05-2021 900 Arrive at -------530 am 03-08-2021 NPO after MN NO Solid Food.  Clear liquids from MN until---430 am then npo Med rec completed Medications to take morning of surgery -----none Diabetic medication -----n/a Patient instructed to bring photo id and insurance card day of surgery Patient aware to have Driver (ride ) / caregiver   Wife Todd [name] will stay  for 24 hours after surgery  Patient Special Instructions -----pt given overnight stay instructions Pre-Op special Istructions -----bring cpap mask tubing and machine day of surgery Patient verbalized understanding of instructions that were given at this phone interview. Patient denies shortness of breath, chest pain, fever, cough at this phone interview.

## 2021-03-05 ENCOUNTER — Other Ambulatory Visit (HOSPITAL_COMMUNITY)
Admission: RE | Admit: 2021-03-05 | Discharge: 2021-03-05 | Disposition: A | Discharge status: Home / Self Care | Payer: Medicare Other | Source: Ambulatory Visit | Attending: Urology | Admitting: Urology

## 2021-03-05 DIAGNOSIS — Z01812 Encounter for preprocedural laboratory examination: Secondary | ICD-10-CM | POA: Diagnosis present

## 2021-03-05 DIAGNOSIS — Z20822 Contact with and (suspected) exposure to covid-19: Secondary | ICD-10-CM | POA: Diagnosis not present

## 2021-03-05 LAB — SARS CORONAVIRUS 2 (TAT 6-24 HRS): SARS Coronavirus 2: NEGATIVE

## 2021-03-07 NOTE — Anesthesia Preprocedure Evaluation (Addendum)
Anesthesia Evaluation  Patient identified by MRN, date of birth, ID band Patient awake    Reviewed: Allergy & Precautions, H&P , NPO status , Patient's Chart, lab work & pertinent test results  Airway Mallampati: II  TM Distance: >3 FB Neck ROM: Full    Dental no notable dental hx. (+) Teeth Intact, Caps, Dental Advisory Given,    Pulmonary sleep apnea and Continuous Positive Airway Pressure Ventilation , former smoker,    Pulmonary exam normal breath sounds clear to auscultation       Cardiovascular Exercise Tolerance: Good hypertension, Normal cardiovascular exam Rhythm:Regular Rate:Normal     Neuro/Psych  Neuromuscular disease    GI/Hepatic hiatal hernia,   Endo/Other    Renal/GU Renal disease     Musculoskeletal  (+) Arthritis , Osteoarthritis,    Abdominal   Peds  Hematology   Anesthesia Other Findings   Reproductive/Obstetrics                            Anesthesia Physical Anesthesia Plan  ASA: III  Anesthesia Plan: General   Post-op Pain Management:    Induction: Intravenous  PONV Risk Score and Plan: 2 and Ondansetron and Dexamethasone  Airway Management Planned: Oral ETT and LMA  Additional Equipment: None  Intra-op Plan:   Post-operative Plan: Extubation in OR  Informed Consent: I have reviewed the patients History and Physical, chart, labs and discussed the procedure including the risks, benefits and alternatives for the proposed anesthesia with the patient or authorized representative who has indicated his/her understanding and acceptance.       Plan Discussed with: Anesthesiologist  Anesthesia Plan Comments:         Anesthesia Quick Evaluation

## 2021-03-08 ENCOUNTER — Encounter (HOSPITAL_BASED_OUTPATIENT_CLINIC_OR_DEPARTMENT_OTHER)
Admission: RE | Disposition: A | Discharge status: Home / Self Care | Payer: Self-pay | Source: Home / Self Care | Attending: Urology

## 2021-03-08 ENCOUNTER — Encounter (HOSPITAL_BASED_OUTPATIENT_CLINIC_OR_DEPARTMENT_OTHER): Payer: Self-pay | Admitting: Urology

## 2021-03-08 ENCOUNTER — Other Ambulatory Visit: Payer: Self-pay

## 2021-03-08 ENCOUNTER — Ambulatory Visit (HOSPITAL_BASED_OUTPATIENT_CLINIC_OR_DEPARTMENT_OTHER)
Admission: RE | Admit: 2021-03-08 | Discharge: 2021-03-09 | Disposition: A | Discharge status: Home / Self Care | Payer: Medicare Other | Attending: Urology | Admitting: Urology

## 2021-03-08 ENCOUNTER — Ambulatory Visit (HOSPITAL_BASED_OUTPATIENT_CLINIC_OR_DEPARTMENT_OTHER): Payer: Medicare Other | Admitting: Anesthesiology

## 2021-03-08 DIAGNOSIS — N210 Calculus in bladder: Secondary | ICD-10-CM | POA: Insufficient documentation

## 2021-03-08 DIAGNOSIS — N138 Other obstructive and reflux uropathy: Secondary | ICD-10-CM | POA: Insufficient documentation

## 2021-03-08 DIAGNOSIS — Z87891 Personal history of nicotine dependence: Secondary | ICD-10-CM | POA: Insufficient documentation

## 2021-03-08 DIAGNOSIS — Z85828 Personal history of other malignant neoplasm of skin: Secondary | ICD-10-CM | POA: Insufficient documentation

## 2021-03-08 DIAGNOSIS — N401 Enlarged prostate with lower urinary tract symptoms: Secondary | ICD-10-CM | POA: Diagnosis not present

## 2021-03-08 HISTORY — DX: Presence of external hearing-aid: Z97.4

## 2021-03-08 HISTORY — PX: TRANSURETHRAL RESECTION OF PROSTATE: SHX73

## 2021-03-08 HISTORY — DX: Personal history of other diseases of the circulatory system: Z86.79

## 2021-03-08 HISTORY — DX: Personal history of other specified conditions: Z87.898

## 2021-03-08 HISTORY — DX: Benign prostatic hyperplasia without lower urinary tract symptoms: N40.0

## 2021-03-08 SURGERY — TURP (TRANSURETHRAL RESECTION OF PROSTATE)
Anesthesia: General | Site: Prostate

## 2021-03-08 MED ORDER — EPHEDRINE SULFATE-NACL 50-0.9 MG/10ML-% IV SOSY
PREFILLED_SYRINGE | INTRAVENOUS | Status: DC | PRN
  Administered 2021-03-08 (×2): 10 mg via INTRAVENOUS

## 2021-03-08 MED ORDER — ZOLPIDEM TARTRATE 5 MG PO TABS: 5.0000 mg | ORAL_TABLET | Freq: Every evening | ORAL | Status: DC | PRN

## 2021-03-08 MED ORDER — ONDANSETRON HCL 4 MG/2ML IJ SOLN
INTRAMUSCULAR | Status: DC | PRN
  Administered 2021-03-08: 4 mg via INTRAVENOUS

## 2021-03-08 MED ORDER — LACTATED RINGERS IV SOLN: INTRAVENOUS | Status: DC

## 2021-03-08 MED ORDER — BELLADONNA ALKALOIDS-OPIUM 16.2-30 MG RE SUPP
RECTAL | Status: AC
  Filled 2021-03-08: qty 1

## 2021-03-08 MED ORDER — ONDANSETRON HCL 4 MG/2ML IJ SOLN: 4.0000 mg | Freq: Once | INTRAMUSCULAR | Status: DC | PRN

## 2021-03-08 MED ORDER — LIDOCAINE 2% (20 MG/ML) 5 ML SYRINGE
INTRAMUSCULAR | Status: AC
  Filled 2021-03-08: qty 5

## 2021-03-08 MED ORDER — ACETAMINOPHEN 325 MG PO TABS: 650.0000 mg | ORAL_TABLET | ORAL | Status: DC | PRN

## 2021-03-08 MED ORDER — OXYCODONE HCL 5 MG PO TABS
5.0000 mg | ORAL_TABLET | ORAL | Status: DC | PRN
Start: 2021-03-08 — End: 2021-03-09

## 2021-03-08 MED ORDER — FENTANYL CITRATE (PF) 100 MCG/2ML IJ SOLN
INTRAMUSCULAR | Status: AC
  Filled 2021-03-08: qty 2

## 2021-03-08 MED ORDER — ACETAMINOPHEN 325 MG PO TABS: 325.0000 mg | ORAL_TABLET | ORAL | Status: DC | PRN

## 2021-03-08 MED ORDER — SULFAMETHOXAZOLE-TRIMETHOPRIM 800-160 MG PO TABS
1.0000 | ORAL_TABLET | Freq: Two times a day (BID) | ORAL | Status: DC
  Administered 2021-03-08 (×2): 1 via ORAL
  Filled 2021-03-08 (×4): qty 1

## 2021-03-08 MED ORDER — BACITRACIN-NEOMYCIN-POLYMYXIN OINTMENT TUBE
TOPICAL_OINTMENT | CUTANEOUS | Status: AC
  Filled 2021-03-08: qty 14.17

## 2021-03-08 MED ORDER — LIDOCAINE 2% (20 MG/ML) 5 ML SYRINGE
INTRAMUSCULAR | Status: DC | PRN
  Administered 2021-03-08: 50 mg via INTRAVENOUS

## 2021-03-08 MED ORDER — SENNA 8.6 MG PO TABS
1.0000 | ORAL_TABLET | Freq: Two times a day (BID) | ORAL | Status: DC
  Administered 2021-03-08 (×2): 8.6 mg via ORAL

## 2021-03-08 MED ORDER — FENTANYL CITRATE (PF) 100 MCG/2ML IJ SOLN
25.0000 ug | INTRAMUSCULAR | Status: DC | PRN
  Administered 2021-03-08: 25 ug via INTRAVENOUS

## 2021-03-08 MED ORDER — MEPERIDINE HCL 25 MG/ML IJ SOLN: 6.2500 mg | INTRAMUSCULAR | Status: DC | PRN

## 2021-03-08 MED ORDER — BELLADONNA ALKALOIDS-OPIUM 16.2-60 MG RE SUPP
1.0000 | Freq: Four times a day (QID) | RECTAL | Status: DC | PRN
  Administered 2021-03-08: 1 via RECTAL

## 2021-03-08 MED ORDER — FENTANYL CITRATE (PF) 100 MCG/2ML IJ SOLN
INTRAMUSCULAR | Status: DC | PRN
  Administered 2021-03-08 (×2): 25 ug via INTRAVENOUS
  Administered 2021-03-08: 50 ug via INTRAVENOUS

## 2021-03-08 MED ORDER — ACETAMINOPHEN 160 MG/5ML PO SOLN: 325.0000 mg | ORAL | Status: DC | PRN

## 2021-03-08 MED ORDER — SENNA 8.6 MG PO TABS
ORAL_TABLET | ORAL | Status: AC
  Filled 2021-03-08: qty 1

## 2021-03-08 MED ORDER — PROPOFOL 10 MG/ML IV BOLUS
INTRAVENOUS | Status: AC
  Filled 2021-03-08: qty 40

## 2021-03-08 MED ORDER — CIPROFLOXACIN IN D5W 400 MG/200ML IV SOLN
INTRAVENOUS | Status: AC
  Filled 2021-03-08: qty 200

## 2021-03-08 MED ORDER — SODIUM CHLORIDE 0.9 % IR SOLN
3000.0000 mL | Status: DC
  Administered 2021-03-08 (×3): 3000 mL

## 2021-03-08 MED ORDER — DEXAMETHASONE SODIUM PHOSPHATE 10 MG/ML IJ SOLN
INTRAMUSCULAR | Status: AC
  Filled 2021-03-08: qty 1

## 2021-03-08 MED ORDER — SODIUM CHLORIDE 0.45 % IV SOLN: INTRAVENOUS | Status: DC

## 2021-03-08 MED ORDER — DEXAMETHASONE SODIUM PHOSPHATE 10 MG/ML IJ SOLN
INTRAMUSCULAR | Status: DC | PRN
  Administered 2021-03-08: 5 mg via INTRAVENOUS

## 2021-03-08 MED ORDER — BACITRACIN-NEOMYCIN-POLYMYXIN 400-5-5000 EX OINT
1.0000 "application " | TOPICAL_OINTMENT | Freq: Three times a day (TID) | CUTANEOUS | Status: DC | PRN
  Administered 2021-03-08: 1 via TOPICAL

## 2021-03-08 MED ORDER — ONDANSETRON HCL 4 MG/2ML IJ SOLN
INTRAMUSCULAR | Status: AC
  Filled 2021-03-08: qty 2

## 2021-03-08 MED ORDER — PROPOFOL 10 MG/ML IV BOLUS
INTRAVENOUS | Status: DC | PRN
  Administered 2021-03-08: 160 mg via INTRAVENOUS

## 2021-03-08 MED ORDER — CIPROFLOXACIN IN D5W 400 MG/200ML IV SOLN
400.0000 mg | INTRAVENOUS | Status: AC
  Administered 2021-03-08: 400 mg via INTRAVENOUS

## 2021-03-08 MED ORDER — SODIUM CHLORIDE 0.9 % IR SOLN
Status: DC | PRN
  Administered 2021-03-08 (×3): 3000 mL via INTRAVESICAL
  Administered 2021-03-08: 6000 mL via INTRAVESICAL
  Administered 2021-03-08 (×3): 3000 mL via INTRAVESICAL

## 2021-03-08 MED ORDER — OXYCODONE HCL 5 MG PO TABS: 5.0000 mg | ORAL_TABLET | Freq: Once | ORAL | Status: DC | PRN

## 2021-03-08 MED ORDER — OXYCODONE HCL 5 MG/5ML PO SOLN: 5.0000 mg | Freq: Once | ORAL | Status: DC | PRN

## 2021-03-08 SURGICAL SUPPLY — 36 items
BAG DRAIN URO-CYSTO SKYTR STRL (DRAIN) ×2 IMPLANT
BAG DRN RND TRDRP ANRFLXCHMBR (UROLOGICAL SUPPLIES) ×1
BAG DRN UROCATH (DRAIN) ×1
BAG URINE DRAIN 2000ML AR STRL (UROLOGICAL SUPPLIES) ×2 IMPLANT
BAG URINE LEG 500ML (DRAIN) IMPLANT
CATH FOLEY 2WAY SLVR  5CC 20FR (CATHETERS)
CATH FOLEY 2WAY SLVR  5CC 22FR (CATHETERS)
CATH FOLEY 2WAY SLVR 30CC 22FR (CATHETERS) IMPLANT
CATH FOLEY 2WAY SLVR 5CC 20FR (CATHETERS) IMPLANT
CATH FOLEY 2WAY SLVR 5CC 22FR (CATHETERS) IMPLANT
CATH FOLEY 3WAY 30CC 22FR (CATHETERS) IMPLANT
CATH HEMA 3WAY 30CC 24FR COUDE (CATHETERS) IMPLANT
CATH HEMA 3WAY 30CC 24FR RND (CATHETERS) IMPLANT
CLOTH BEACON ORANGE TIMEOUT ST (SAFETY) ×2 IMPLANT
ELECT REM PT RETURN 9FT ADLT (ELECTROSURGICAL) ×2
ELECTRODE REM PT RTRN 9FT ADLT (ELECTROSURGICAL) ×1 IMPLANT
EVACUATOR MICROVAS BLADDER (UROLOGICAL SUPPLIES) ×2 IMPLANT
GLOVE SURG ENC MOIS LTX SZ8 (GLOVE) ×2 IMPLANT
GLOVE SURG LTX SZ6.5 (GLOVE) ×4 IMPLANT
GLOVE SURG UNDER POLY LF SZ6.5 (GLOVE) ×4 IMPLANT
GOWN STRL REUS W/TWL LRG LVL3 (GOWN DISPOSABLE) ×2 IMPLANT
GOWN STRL REUS W/TWL XL LVL3 (GOWN DISPOSABLE) ×2 IMPLANT
HOLDER FOLEY CATH W/STRAP (MISCELLANEOUS) IMPLANT
IV NS IRRIG 3000ML ARTHROMATIC (IV SOLUTION) ×16 IMPLANT
KIT TURNOVER CYSTO (KITS) ×2 IMPLANT
LOOP CUT BIPOLAR 24F LRG (ELECTROSURGICAL) ×4 IMPLANT
MANIFOLD NEPTUNE II (INSTRUMENTS) ×2 IMPLANT
NS IRRIG 500ML POUR BTL (IV SOLUTION) ×2 IMPLANT
PACK CYSTO (CUSTOM PROCEDURE TRAY) ×2 IMPLANT
PLUG CATH AND CAP STER (CATHETERS) IMPLANT
SYR 30ML LL (SYRINGE) IMPLANT
SYR TOOMEY IRRIG 70ML (MISCELLANEOUS) ×2
SYRINGE TOOMEY IRRIG 70ML (MISCELLANEOUS) ×1 IMPLANT
TUBE CONNECTING 12X1/4 (SUCTIONS) ×2 IMPLANT
TUBING UROLOGY SET (TUBING) ×2 IMPLANT
WATER STERILE IRR 500ML POUR (IV SOLUTION) ×2 IMPLANT

## 2021-03-08 NOTE — H&P (Signed)
H&P  Chief Complaint: Large prostate  History of Present Illness: 80 yo male presents for TURP/cystolithalopaxy for mgmt of BPH w/ obstruction as well as small bladder stones. He has significant obstructive symptomatology despite treatment w/ alpha blockers.  Past Medical History:  Diagnosis Date  . Arthritis    hands  . Bladder stone   . BPH (benign prostatic hyperplasia)   . Cancer ([place])    skin cancer multiple; followed by [facility]/dermatology yearly.  . Colon polyps     Previous polyps on colonoscopy.  [facility].    Marland Kitchen Hearing aid worn    both ears  . Hiatal hernia 10/20/2013   EGD confirmed.  [facility].  Marland Kitchen History of Barrett's esophagus 2014   pt told by dr Watt Climes no barrett's esophagus 03-03-2021  . History of hypertension    no meds since 2019  . History of prediabetes    non pre dm since june 2021 weight loss  . Hyperlipemia   . Neuromuscular disorder (H[facility])    numbness rt hand  . Seasonal allergies   . Sleep apnea    uses CPAP nightly    Past Surgical History:  Procedure Laterality Date  . BLADDER STONE REMOVAL  2011, 2018  . CARPAL TUNNEL RELEASE  01/17/2012   Procedure: CARPAL TUNNEL RELEASE;  Surgeon: Cammie Sickle., MD;  Location: [facility];  Service: Orthopedics;  Laterality: Right;  . [place]  . COLONOSCOPY  10/20/2013   [facility].  Polyps.   . CYSTOSCOPY WITH LITHOLAPAXY N/A 01/09/2017   Procedure: CYSTOSCOPY WITH LITHOLAPAXY;  Surgeon: Franchot Gallo, MD;  Location: [facility];  Service: Urology;  Laterality: N/A;  . ESOPHAGOGASTRODUODENOSCOPY  10/20/2013   [facility]; biopsy of area concernig for Barrett's; gastritis.  Marland Kitchen EYE SURGERY  2012   both cataracts  . FRACTURE SURGERY Right 1960's   3 fingers  . HOLMIUM LASER APPLICATION N/A 04/08/8314   Procedure: HOLMIUM LASER APPLICATION;  Surgeon: Franchot Gallo, MD;  Location: [facility];  Service: Urology;  Laterality: N/A;  . QUADRICEPS TENDON REPAIR  2003    bilateral  . ULNAR NERVE TRANSPOSITION  01/17/2012   Procedure: ULNAR NERVE DECOMPRESSION/TRANSPOSITION;  Surgeon: Cammie Sickle., MD;  Location: [facility];  Service: Orthopedics;  Laterality: Right;  decompression of ulnar nerve only  at right cubital tunnel  . ULNAR NERVE TRANSPOSITION Left 06/01/2017   Procedure: DECOMPRESSION ULNAR NERVE LEFT CUBITAL TUNNEL;  Surgeon: Leanora Cover, MD;  Location: [place] [facility];  Service: Orthopedics;  Laterality: Left;  . UPPER GASTROINTESTINAL ENDOSCOPY    . VASECTOMY  yrs ago    Home Medications:    Allergies:  Allergies  Allergen Reactions  . Shellfish Allergy     Headaches, high blood pressure  . Ace Inhibitors Cough  . Amoxicillin Nausea And Vomiting  . Darvon     Chest pain  . Toprol Xl [Metoprolol Tartrate]     Not sure reaction    Family History  Problem Relation Age of Onset  . Cirrhosis Father   . Hypertension Father   . Alcohol abuse Father   . Heart failure Mother        in 32s  . Arthritis Brother        Rheumatoid arthritis  . Hypertension Brother     Social History:  reports that he quit smoking about 47 years ago. His smoking use included cigarettes. He has a 30.00 pack-year smoking history. He  has never used smokeless tobacco. He reports current alcohol use of about 1.0 standard drink of alcohol per week. He reports that he does not use drugs.  ROS: A complete review of systems was performed.  All systems are negative except for pertinent findings as noted.  Physical Exam:  Vital signs in last 24 hours: BP 136/85   Pulse 76   Temp 97.8 F (36.6 C) (Oral)   Resp (!) 24   Ht 5\' 11"  (1.803 m)   Wt 85.7 kg   SpO2 99%   BMI 26.35 kg/m  Constitutional:  Alert and oriented, No acute distress Cardiovascular: Regular rate  Respiratory: Normal respiratory effort GI: Abdomen is soft, nontender, nondistended, no abdominal masses. No CVAT.  Genitourinary: Normal male phallus, testes are  descended bilaterally and non-tender and without masses, scrotum is normal in appearance without lesions or masses, perineum is normal on inspection. Lymphatic: No lymphadenopathy Neurologic: Grossly intact, no focal deficits Psychiatric: Normal mood and affect  Laboratory Data:  No results for input(s): WBC, HGB, HCT, PLT in the last 72 hours.  No results for input(s): NA, K, CL, GLUCOSE, BUN, CALCIUM, CREATININE in the last 72 hours.  Invalid input(s): CO3   No results found for this or any previous visit (from the past 24 hour(s)). Recent Results (from the past 240 hour(s))  SARS CORONAVIRUS 2 (TAT 6-24 HRS) Nasopharyngeal Nasopharyngeal Swab     Status: None   Collection Time: 03/05/21  8:55 AM   Specimen: Nasopharyngeal Swab  Result Value Ref Range Status   SARS Coronavirus 2 NEGATIVE NEGATIVE Final    Comment: (NOTE) SARS-CoV-2 target nucleic acids are NOT DETECTED.  The SARS-CoV-2 RNA is generally detectable in upper and lower respiratory specimens during the acute phase of infection. Negative results do not preclude SARS-CoV-2 infection, do not rule out co-infections with other pathogens, and should not be used as the sole basis for treatment or other patient management decisions. Negative results must be combined with clinical observations, patient history, and epidemiological information. The expected result is Negative.  Fact Sheet for Patients: SugarRoll.be  Fact Sheet for Healthcare Providers: https://www.woods-mathews.com/  This test is not yet approved or cleared by the [place] FDA and  has been authorized for detection and/or diagnosis of SARS-CoV-2 by FDA under an Emergency Use Authorization (EUA). This EUA will remain  in effect (meaning this test can be used) for the duration of the COVID-19 declaration under Se ction 564(b)(1) of the Act, 21 U.S.C. section 360bbb-3(b)(1), unless the authorization is  terminated or revoked sooner.  Performed at [facility], [place] [address] [place], [place] [address]     Renal Function: No results for input(s): CREATININE in the last 168 hours. CrCl cannot be calculated (Patient's most recent lab result is older than the maximum 21 days allowed.).  Radiologic Imaging: No results found.  Impression/Assessment:  BPH w/ obstruction, bladder calculi  Plan:  TURP/cystolithalopaxy

## 2021-03-08 NOTE — Anesthesia Procedure Notes (Signed)
Procedure Name: LMA Insertion Date/Time: 03/08/2021 7:40 AM Performed by: Suan Halter, CRNA Pre-anesthesia Checklist: Patient identified, Emergency Drugs available, Suction available and Patient being monitored Patient Re-evaluated:Patient Re-evaluated prior to induction Oxygen Delivery Method: Circle system utilized Preoxygenation: Pre-oxygenation with 100% oxygen Induction Type: IV induction Ventilation: Mask ventilation without difficulty LMA: LMA with gastric port inserted LMA Size: 4.0 Number of attempts: 2 Airway Equipment and Method: Bite block Placement Confirmation: positive ETCO2 Tube secured with: Tape Dental Injury: Teeth and Oropharynx as per pre-operative assessment

## 2021-03-08 NOTE — Op Note (Signed)
Preoperative diagnosis: 1. Bladder outlet obstruction secondary to BPH 2. Bladder calculi, multiple small  Postoperative diagnosis:  1. Bladder outlet obstruction secondary to BPH 2. Bladder calculi, multiple small  Procedure:  1. Cystoscopy 2. Transurethral resection of the prostate 3. Cystolitholapaxy  Surgeon: Lillette Boxer. Hikari Tripp, M.D.  Anesthesia: General  Complications: None  Drain: Foley catheter  EBL: Minimal  Specimens: 1. Prostate chips  Disposition of specimens: Pathology  Indication: MONTELL LEOPARD is a patient with bladder outlet obstruction secondary to benign prostatic hyperplasia.  He also has cystoscopic evidence of multiple small bladder calculi.  He has a prior history of cystolitholapaxy.  After reviewing the management options for treatment, he elected to proceed with the above surgical procedure(s). We have discussed the potential benefits and risks of the procedure, side effects of the proposed treatment, the likelihood of the patient achieving the goals of the procedure, and any potential problems that might occur during the procedure or recuperation. Informed consent has been obtained.  Description of procedure:  The patient was identified in the holding area. He received preoperative antibiotics. He was then taken to the operating room. General anesthetic was administered.  The patient was then placed in the dorsal lithotomy position, prepped and draped in the usual sterile fashion. Timeout was then performed.  A resectoscope sheath was placed using the visual obturator.   The bladder was then systematically examined in its entirety.  Multiple small stones, the largest of which was possibly 5 mm, were found layering posteriorly in the bladder.  No urothelial lesions were noted.  Ureteral orifices  were normal in configuration location.  Most of the bladder calculi were irrigated free of the bladder with the [facility] syringe.  The ureteral orifices were  identified so as to be avoided during the procedure.  The prostate adenoma was then resected utilizing loop cautery resection with the bipolar cutting loop.  The prostate adenoma from the bladder neck back to the verumontanum was resected beginning at the six o'clock position and then extended to include the right and left lobes of the prostate and anterior prostate, respectively. Care was taken not to resect distal to the verumontanum.  Hemostasis was then achieved with the cautery and the bladder was emptied and reinspected with no significant bleeding noted at the end of the procedure.  Resected chips were irrigated from the bladder with the evacuator and sent to pathology.  A 3 way catheter was then placed into the bladder and placed on continuous bladder irrigation.  The patient appeared to tolerate the procedure well and without complications. The patient was able to be awakened and transferred to the recovery unit in satisfactory condition. He tolerated the procedure well.

## 2021-03-08 NOTE — Transfer of Care (Signed)
Immediate Anesthesia Transfer of Care Note  Patient: Todd [name]  Procedure(s) Performed: Procedure(s) (LRB): TRANSURETHRAL RESECTION OF THE PROSTATE (TURP) (N/A)  Patient Location: PACU  Anesthesia Type: General  Level of Consciousness: awake, oriented, sedated and patient cooperative  Airway & Oxygen Therapy: Patient Spontanous Breathing and Patient connected to face mask oxygen  Post-op Assessment: Report given to PACU RN and Post -op Vital signs reviewed and stable  Post vital signs: Reviewed and stable  Complications: No apparent anesthesia complications Last Vitals:  Vitals Value Taken Time  BP 111/68 03/08/21 0845  Temp    Pulse 57 03/08/21 0850  Resp 12 03/08/21 0850  SpO2 93 % 03/08/21 0850  Vitals shown include unvalidated device data.  Last Pain:  Vitals:   03/08/21 0532  TempSrc: Oral         Complications: No complications documented.

## 2021-03-08 NOTE — Anesthesia Postprocedure Evaluation (Signed)
Anesthesia Post Note  Patient: Todd [name]  Procedure(s) Performed: TRANSURETHRAL RESECTION OF THE PROSTATE (TURP) (N/A Prostate)     Patient location during evaluation: PACU Anesthesia Type: General Level of consciousness: awake and alert Pain management: pain level controlled Vital Signs Assessment: post-procedure vital signs reviewed and stable Respiratory status: spontaneous breathing, nonlabored ventilation, respiratory function stable and patient connected to nasal cannula oxygen Cardiovascular status: blood pressure returned to baseline and stable Postop Assessment: no apparent nausea or vomiting Anesthetic complications: no   No complications documented.  Last Vitals:  Vitals:   03/08/21 1020 03/08/21 1315  BP: 132/73 128/72  Pulse: 69 80  Resp: 15 18  Temp: 36.6 C 36.5 C  SpO2: 99% 96%    Last Pain:  Vitals:   03/08/21 1020  TempSrc:   PainSc: 0-No pain                 Sabrine Patchen

## 2021-03-08 NOTE — Interval H&P Note (Signed)
History and Physical Interval Note:  03/08/2021 7:27 AM  Todd [name]  has presented today for surgery, with the diagnosis of BENIGN PROSTATIC HYPERPLASIA.  The various methods of treatment have been discussed with the patient and family. After consideration of risks, benefits and other options for treatment, the patient has consented to  Procedure(s) with comments: [place] (TURP) (N/A) - 75 MINS as a surgical intervention.  The patient's history has been reviewed, patient examined, no change in status, stable for surgery.  I have reviewed the patient's chart and labs.  Questions were answered to the patient's satisfaction.     Lillette Boxer Rameses Ou

## 2021-03-08 NOTE — Discharge Instructions (Signed)
Transurethral Resection of the Prostate ° °Care After ° °Refer to this sheet in the next few weeks. These discharge instructions provide you with general information on caring for yourself after you leave the hospital. Your caregiver may also give you specific instructions. Your treatment has been planned according to the most current medical practices available, but unavoidable complications sometimes occur. If you have any problems or questions after discharge, please call your caregiver. ° °HOME CARE INSTRUCTIONS  ° °Medications °· You may receive medicine for pain management. As your level of discomfort decreases, adjustments in your pain medicines may be made.  °· Take all medicines as directed.  °· You may be given a medicine (antibiotic) to kill germs following surgery. Finish all medicines. Let your caregiver know if you have any side effects or problems from the medicine.  °· If you are on aspirin, it would be best not to restart the aspirin until the blood in the urine clears °Hygiene °· You can take a shower after surgery.  °· You should not take a bath while you still have the urethral catheter. °Activity °· You will be encouraged to get out of bed as much as possible and increase your activity level as tolerated.  °· Spend the first week in and around your home. For 3 weeks, avoid the following:  °· Straining.  °· Running.  °· Strenuous work.  °· Walks longer than a few blocks.  °· Riding for extended periods.  °· Sexual relations.  °· Do not lift heavy objects (more than 20 pounds) for at least 1 month. When lifting, use your arms instead of your abdominal muscles.  °· You will be encouraged to walk as tolerated. Do not exert yourself. Increase your activity level slowly. Remember that it is important to keep moving after an operation of any type. This cuts down on the possibility of developing blood clots.  °· Your caregiver will tell you when you can resume driving and light housework. Discuss this  at your first office visit after discharge. °Diet °· No special diet is ordered after a TURP. However, if you are on a special diet for another medical problem, it should be continued.  °· Normal fluid intake is usually recommended.  °· Avoid alcohol and caffeinated drinks for 2 weeks. They irritate the bladder. Decaffeinated drinks are okay.  °· Avoid spicy foods.  °Bladder Function °· For the first 10 days, empty the bladder whenever you feel a definite desire. Do not try to hold the urine for long periods of time.  °· Urinating once or twice a night even after you are healed is not uncommon.  °· You may see some recurrence of blood in the urine after discharge from the hospital. This usually happens within 2 weeks after the procedure.If this occurs, force fluids again as you did in the hospital and reduce your activity.  °Bowel Function °· You may experience some constipation after surgery. This can be minimized by increasing fluids and fiber in your diet. Drink enough water and fluids to keep your urine clear or pale yellow.  °· A stool softener may be prescribed for use at home. Do not strain to move your bowels.  °· If you are requiring increased pain medicine, it is important that you take stool softeners to prevent constipation. This will help to promote proper healing by reducing the need to strain to move your bowels.  °Sexual Activity °· Semen movement in the opposite direction and into the bladder (  retrograde ejaculation) may occur. Since the semen passes into the bladder, cloudy urine can occur the first time you urinate after intercourse. Or, you may not have an ejaculation during erection. Ask your caregiver when you can resume sexual activity. Retrograde ejaculation and reduced semen discharge should not reduce one's pleasure of intercourse.  °Postoperative Visit °· Arrange the date and time of your after surgery visit with your caregiver.  °Return to Work °· After your recovery is complete, you will  be able to return to work and resume all activities. Your caregiver will inform you when you can return to work.  °Foley Catheter Care °A soft, flexible tube (Foley catheter) may have been placed in your bladder to drain urine and fluid. Follow these instructions: °Taking Care of the Catheter °· Keep the area where the catheter leaves your body clean.  °· Attach the catheter to the leg so there is no tension on the catheter.  °· Keep the drainage bag below the level of the bladder, but keep it OFF the floor.  °· Do not take long soaking baths. Your caregiver will give instructions about showering.  °· Wash your hands before touching ANYTHING related to the catheter or bag.  °· Using mild soap and warm water on a washcloth:  °· Clean the area closest to the catheter insertion site using a circular motion around the catheter.  °· Clean the catheter itself by wiping AWAY from the insertion site for several inches down the tube.  °· NEVER wipe upward as this could sweep bacteria up into the urethra (tube in your body that normally drains the bladder) and cause infection.  °· Place a small amount of sterile lubricant at the tip of the penis where the catheter is entering.  °Taking Care of the Drainage Bags °· Two drainage bags may be taken home: a large overnight drainage bag, and a smaller leg bag which fits underneath clothing.  °· It is okay to wear the overnight bag at any time, but NEVER wear the smaller leg bag at night.  °· Keep the drainage bag well below the level of your bladder. This prevents backflow of urine into the bladder and allows the urine to drain freely.  °· Anchor the tubing to your leg to prevent pulling or tension on the catheter. Use tape or a leg strap provided by the hospital.  °· Empty the drainage bag when it is 1/2 to 3/4 full. Wash your hands before and after touching the bag.  °· Periodically check the tubing for kinks to make sure there is no pressure on the tubing which could restrict  the flow of urine.  °Changing the Drainage Bags °· Cleanse both ends of the clean bag with alcohol before changing.  °· Pinch off the rubber catheter to avoid urine spillage during the disconnection.  °· Disconnect the dirty bag and connect the clean one.  °· Empty the dirty bag carefully to avoid a urine spill.  °· Attach the new bag to the leg with tape or a leg strap.  °Cleaning the Drainage Bags °· Whenever a drainage bag is disconnected, it must be cleaned quickly so it is ready for the next use.  °· Wash the bag in warm, soapy water.  °· Rinse the bag thoroughly with warm water.  °· Soak the bag for 30 minutes in a solution of white vinegar and water (1 cup vinegar to 1 quart warm water).  °· Rinse with warm water.  °SEEK MEDICAL   CARE IF:  °· You have chills or night sweats.  °· You are leaking around your catheter or have problems with your catheter. It is not uncommon to have sporadic leakage around your catheter as a result of bladder spasms. If the leakage stops, there is not much need for concern. If you are uncertain, call your caregiver.  °· You develop side effects that you think are coming from your medicines.  °SEEK IMMEDIATE MEDICAL CARE IF:  °· You are suddenly unable to urinate. Check to see if there are any kinks in the drainage tubing that may cause this. If you cannot find any kinks, call your caregiver immediately. This is an emergency.  °· You develop shortness of breath or chest pains.  °· Bleeding persists or clots develop in your urine.  °· You have a fever.  °· You develop pain in your back or over your lower belly (abdomen).  °· You develop pain or swelling in your legs.  °· Any problems you are having get worse rather than better.  °MAKE SURE YOU:  °· Understand these instructions.  °· Will watch your condition.  °· Will get help right away if you are not doing well or get worse.  °Document Released: 10/24/2005 Document Revised: 07/06/2011 Document Reviewed: 06/17/2009 °[employer]®  Patient Information ©2012 [employer].Transurethral Resection of the Prostate °Care After °Refer to this sheet in the next few weeks. These discharge instructions provide you with general information on caring for yourself after you leave the hospital. Your caregiver may also give you specific instructions. Your treatment has been planned according to the most current medical practices available, but unavoidable complications sometimes occur. If you have any problems or questions after discharge, please call your caregiver. °

## 2021-03-09 ENCOUNTER — Encounter (HOSPITAL_BASED_OUTPATIENT_CLINIC_OR_DEPARTMENT_OTHER): Payer: Self-pay | Admitting: Urology

## 2021-03-09 LAB — SURGICAL PATHOLOGY

## 2021-03-09 MED ORDER — SULFAMETHOXAZOLE-TRIMETHOPRIM 800-160 MG PO TABS: 1.0000 | ORAL_TABLET | Freq: Two times a day (BID) | ORAL | 0 refills | Status: DC

## 2021-03-09 NOTE — Discharge Summary (Signed)
Date of admission: 03/08/2021  Date of discharge: 03/09/2021  Admission diagnosis: BPH with BOO  Discharge diagnosis: BPH with BOO  Secondary diagnoses:  Patient Active Problem List   Diagnosis Date Noted  . Enlarged prostate with urinary obstruction 03/08/2021  . Prediabetes 03/07/2019  . Bladder stone 03/20/2017  . 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 OSA on CPAP 02/21/2017  . Primary osteoarthritis of both hands 03/01/2016  . Mixed hyperlipidemia 03/01/2016  . Seasonal allergic rhinitis due to pollen 03/01/2016  . BMI 31.0-31.9,adult 03/01/2016  . Obesity 03/01/2016  . Angioedema 02/16/2013    Procedures performed: Procedure(s): TRANSURETHRAL RESECTION OF THE PROSTATE (TURP)  History and Physical: For full details, please see admission history and physical. Briefly, Todd [name] is a 80 y.o. year old patient with history of BPH with BOO.   Hospital Course: Patient tolerated the procedure well.  He was then transferred to the floor after an uneventful PACU stay.  His hospital course was uncomplicated.  On POD#1 he had met discharge criteria: was eating a regular diet, was up and ambulating independently,  pain was well controlled, continuous bladder irrigation was weaned and he was voiding without a catheter, and was ready to for discharge.   Laboratory values:  No results for input(s): WBC, HGB, HCT in the last 72 hours. No results for input(s): NA, K, CL, CO2, GLUCOSE, BUN, CREATININE, CALCIUM in the last 72 hours. No results for input(s): LABPT, INR in the last 72 hours. No results for input(s): LABURIN in the last 72 hours. Results for orders placed or performed during the hospital encounter of 03/05/21  SARS CORONAVIRUS 2 (TAT 6-24 HRS) Nasopharyngeal Nasopharyngeal Swab     Status: None   Collection Time: 03/05/21  8:55 AM   Specimen: Nasopharyngeal Swab  Result Value Ref Range Status   SARS Coronavirus 2 NEGATIVE  NEGATIVE Final    Comment: (NOTE) SARS-CoV-2 target nucleic acids are NOT DETECTED.  The SARS-CoV-2 RNA is generally detectable in upper and lower respiratory specimens during the acute phase of infection. Negative results do not preclude SARS-CoV-2 infection, do not rule out co-infections with other pathogens, and should not be used as the sole basis for treatment or other patient management decisions. Negative results must be combined with clinical observations, patient history, and epidemiological information. The expected result is Negative.  Fact Sheet for Patients: SugarRoll.be  Fact Sheet for Healthcare Providers: https://www.woods-mathews.com/  This test is not yet approved or cleared by the [place] FDA and  has been authorized for detection and/or diagnosis of SARS-CoV-2 by FDA under an Emergency Use Authorization (EUA). This EUA will remain  in effect (meaning this test can be used) for the duration of the COVID-19 declaration under Se ction 564(b)(1) of the Act, 21 U.S.C. section 360bbb-3(b)(1), unless the authorization is terminated or revoked sooner.  Performed at [facility], [place] [address], [place], [place] [address]     Disposition: Home  Discharge instruction: The patient was instructed to be ambulatory but told to refrain from heavy lifting, strenuous activity, or driving.   Discharge medications:  Allergies as of 03/09/2021      Reactions   Shellfish Allergy    Headaches, high blood pressure   Ace Inhibitors Cough   Amoxicillin Nausea And Vomiting   Darvon    Chest pain   Toprol Xl [metoprolol Tartrate]    Not sure reaction      Medication List  TAKE these medications   acetaminophen 500 MG tablet Commonly known as: TYLENOL Take 1,000 mg by mouth every 6 (six) hours as needed.   BC FAST PAIN RELIEF ARTHRITIS PO Take by mouth.   fish oil-omega-3 fatty acids 1000 MG capsule daily. 6722 mg  BID   GARLIC PO Take 773 mg by mouth 2 (two) times daily. 2 tabs bid per pt   multivitamin tablet Take 1 tablet by mouth daily.   PAPAYA AND ENZYMES PO Take by mouth.   psyllium 58.6 % powder Commonly known as: METAMUCIL Take 1 packet by mouth daily.   sulfamethoxazole-trimethoprim 800-160 MG tablet Commonly known as: BACTRIM DS Take 1 tablet by mouth 2 (two) times daily.       Followup:   Follow-up Information    Karen Kays, NP.   Specialty: Nurse Practitioner Why: 5.23.2022 @0830  Contact information: [address] [place] [address] [phone]

## 2021-04-07 ENCOUNTER — Ambulatory Visit (INDEPENDENT_AMBULATORY_CARE_PROVIDER_SITE_OTHER): Payer: Medicare Other | Admitting: Family Medicine

## 2021-04-07 ENCOUNTER — Other Ambulatory Visit: Payer: Self-pay

## 2021-04-07 ENCOUNTER — Encounter: Payer: Self-pay | Admitting: Family Medicine

## 2021-04-07 VITALS — BP 116/60 | HR 73 | Temp 98.4°F | Resp 16 | Ht 71.0 in | Wt 194.6 lb

## 2021-04-07 DIAGNOSIS — E782 Mixed hyperlipidemia: Secondary | ICD-10-CM | POA: Diagnosis not present

## 2021-04-07 DIAGNOSIS — R351 Nocturia: Secondary | ICD-10-CM

## 2021-04-07 DIAGNOSIS — G4733 Obstructive sleep apnea (adult) (pediatric): Secondary | ICD-10-CM

## 2021-04-07 DIAGNOSIS — Z9989 Dependence on other enabling machines and devices: Secondary | ICD-10-CM

## 2021-04-07 DIAGNOSIS — N401 Enlarged prostate with lower urinary tract symptoms: Secondary | ICD-10-CM

## 2021-04-07 DIAGNOSIS — R7303 Prediabetes: Secondary | ICD-10-CM | POA: Diagnosis not present

## 2021-04-07 LAB — HEMOGLOBIN A1C: Hgb A1c MFr Bld: 5.8 % (ref 4.6–6.5)

## 2021-04-07 NOTE — Progress Notes (Signed)
Subjective:  Patient ID: Todd [name], male    DOB: 11/27/1940  Age: 80 y.o. MRN: [id number]  CC:  Chief Complaint  Patient presents with  . Prediabetes    Recheck on prediabetes today feels well no refills today no physical symptoms feels well.   . Hyperlipidemia    Pt has history of hyperlipidemia here for check in today, no concerns     HPI Todd [name] presents for   Prediabetes: With obesity, OSA.  Weight has actually improved since March.  Most recent testing in December was within normal range. He did have a TURP for prostate enlargement, Dr. Diona Fanti. Improving. Bleeding has resolved. Urinating somewhat better.  Continues on OSA for CPAP, Dr. Rexene Alberts.  CPAP doing ok - still trying to adjust settings. appt in June. Not snoring. Nocturia impacting sleep as well.  Starting to improve in past week. 4 hr stretch last night.  Had been down to 185 at home, some weight up with recent surgery and less exercise.  Lab Results  Component Value Date   HGBA1C 5.5 10/26/2020   Wt Readings from Last 3 Encounters:  04/07/21 194 lb 9.6 oz (88.3 kg)  03/08/21 188 lb 14.4 oz (85.7 kg)  01/06/21 199 lb (90.3 kg)   Hyperlipidemia: No current statin, takes fish oil/omega-3 fatty acids, garlic.  Metamucil. Off vitamins for awhile with TURP.  Fasting today.   Lab Results  Component Value Date   CHOL 195 10/26/2020   HDL 53 10/26/2020   LDLCALC 123 (H) 10/26/2020   TRIG 106 10/26/2020   CHOLHDL 3.7 10/26/2020   Lab Results  Component Value Date   ALT 21 10/26/2020   AST 22 10/26/2020   ALKPHOS 115 10/26/2020   BILITOT 0.8 10/26/2020      History Patient Active Problem List   Diagnosis Date Noted  . Enlarged prostate with urinary obstruction 03/08/2021  . Prediabetes 03/07/2019  . Bladder stone 03/20/2017  . 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 OSA on CPAP 02/21/2017  . Primary osteoarthritis of both hands 03/01/2016  .  Mixed hyperlipidemia 03/01/2016  . Seasonal allergic rhinitis due to pollen 03/01/2016  . BMI 31.0-31.9,adult 03/01/2016  . Obesity 03/01/2016  . Angioedema 02/16/2013   Past Medical History:  Diagnosis Date  . Arthritis    hands  . Bladder stone   . BPH (benign prostatic hyperplasia)   . Cancer ([place])    skin cancer multiple; followed by [facility]/dermatology yearly.  . Colon polyps     Previous polyps on colonoscopy.  [facility].    Marland Kitchen Hearing aid worn    both ears  . Hiatal hernia 10/20/2013   EGD confirmed.  [facility].  Marland Kitchen History of Barrett's esophagus 2014   pt told by dr Watt Climes no barrett's esophagus 03-03-2021  . History of hypertension    no meds since 2019  . History of prediabetes    non pre dm since june 2021 weight loss  . Hyperlipemia   . Neuromuscular disorder ([facility])    numbness rt hand  . Seasonal allergies   . Sleep apnea    uses CPAP nightly   Past Surgical History:  Procedure Laterality Date  . BLADDER STONE REMOVAL  2011, 2018  . CARPAL TUNNEL RELEASE  01/17/2012   Procedure: CARPAL TUNNEL RELEASE;  Surgeon: Cammie Sickle., MD;  Location: [facility];  Service: Orthopedics;  Laterality: Right;  . [place]  .  COLONOSCOPY  10/20/2013   [facility].  Polyps.   . CYSTOSCOPY WITH LITHOLAPAXY N/A 01/09/2017   Procedure: CYSTOSCOPY WITH LITHOLAPAXY;  Surgeon: Franchot Gallo, MD;  Location[facility];  Service: Urology;  Laterality: N/A;  . ESOPHAGOGASTRODUODENOSCOPY  10/20/2013   [facility]; biopsy of area concernig for Barrett's; gastritis.  Marland Kitchen EYE SURGERY  2012   both cataracts  . FRACTURE SURGERY Right 1960's   3 fingers  . HOLMIUM LASER APPLICATION N/A 04/13/3418   Procedure: HOLMIUM LASER APPLICATION;  Surgeon: Franchot Gallo, MD;  Location: [facility];  Service: Urology;  Laterality: N/A;  . QUADRICEPS TENDON REPAIR  2003   bilateral  . TRANSURETHRAL RESECTION OF PROSTATE N/A 03/08/2021   Procedure: TRANSURETHRAL  RESECTION OF THE PROSTATE (TURP);  Surgeon: Franchot Gallo, MD;  Location: [facility];  Service: Urology;  Laterality: N/A;  . ULNAR NERVE TRANSPOSITION  01/17/2012   Procedure: ULNAR NERVE DECOMPRESSION/TRANSPOSITION;  Surgeon: Cammie Sickle., MD;  Location: [facility];  Service: Orthopedics;  Laterality: Right;  decompression of ulnar nerve only  at right cubital tunnel  . ULNAR NERVE TRANSPOSITION Left 06/01/2017   Procedure: DECOMPRESSION ULNAR NERVE LEFT CUBITAL TUNNEL;  Surgeon: Leanora Cover, MD;  Location: [facility];  Service: Orthopedics;  Laterality: Left;  . UPPER GASTROINTESTINAL ENDOSCOPY    . VASECTOMY  yrs ago   Allergies  Allergen Reactions  . Shellfish Allergy     Headaches, high blood pressure  . Ace Inhibitors Cough  . Amoxicillin Nausea And Vomiting  . Darvon     Chest pain  . Toprol Xl [Metoprolol Tartrate]     Not sure reaction   Prior to Admission medications   Medication Sig Start Date End Date Taking? Authorizing Provider  acetaminophen (TYLENOL) 500 MG tablet Take 1,000 mg by mouth every 6 (six) hours as needed.    [provider]  Aspirin-Caffeine (BC FAST PAIN RELIEF ARTHRITIS PO) Take by mouth.    [provider]  Digestive Enzymes (PAPAYA AND ENZYMES PO) Take by mouth.    [provider]  fish oil-omega-3 fatty acids 1000 MG capsule daily. 2000 mg BID    [provider]  GARLIC PO Take 379 mg by mouth 2 (two) times daily. 2 tabs bid per pt    [provider]  Multiple Vitamin (MULTIVITAMIN) tablet Take 1 tablet by mouth daily.    [provider]  psyllium (METAMUCIL) 58.6 % powder Take 1 packet by mouth daily.    [provider]   Social History   Socioeconomic History  . Marital status: Married    Spouse name: Not on file  . Number of children: 2  . Years of education: 62  . Highest education level: Not on file  Occupational History   . Occupation: Retired  Tobacco Use  . Smoking status: Former Smoker    Packs/day: 2.00    Years: 15.00    Pack years: 30.00    Types: Cigarettes    Quit date: 01/11/1974    Years since quitting: 47.2  . Smokeless tobacco: Never Used  Vaping Use  . Vaping Use: Never used  Substance and Sexual Activity  . Alcohol use: Yes    Alcohol/week: 1.0 standard drink    Types: 1 Standard drinks or equivalent per week    Comment: occ  . Drug use: No  . Sexual activity: Never  Other Topics Concern  . Not on file  Social History  Narrative   Marital status:  Married x 42 years      Children: 2 daughters; 3 grandchildren.      Employment; retired in 1998 from [employer]; retired age 14 from [employer] after 14 years.      Lives: with wife.      Tobacco: former smoker; quit at age 69.  Smoked 15 years.      Alcohol:  On vacation or special occasions.        Education: [employer].       Exercise: Gym 3-4 times a week for 2 hours.; exercises 7 days per week.   Elliptical for one hour.      Advanced Directives: none; +desires FULL CODE.        ADLs: independent with ADLs; drives.  Does not walk with assistant devices.     Drinks 3-4 caffeine drinks a day    Social Determinants of Radio broadcast assistant Strain: Not on file  Food Insecurity: Not on file  Transportation Needs: Not on file  Physical Activity: Not on file  Stress: Not on file  Social Connections: Not on file  Intimate Partner Violence: Not on file    Review of Systems  Constitutional: Negative for fatigue and unexpected weight change.  Eyes: Negative for visual disturbance.  Respiratory: Negative for cough, chest tightness and shortness of breath.   Cardiovascular: Negative for chest pain, palpitations and leg swelling.  Gastrointestinal: Negative for abdominal pain and blood in stool.  Neurological: Negative for dizziness, light-headedness and headaches.     Objective:   Vitals:   04/07/21  1016  BP: 116/60  Pulse: 73  Resp: 16  Temp: 98.4 F (36.9 C)  TempSrc: Temporal  SpO2: 96%  Weight: 194 lb 9.6 oz (88.3 kg)  Height: 5\' 11"  (1.803 m)     Physical Exam Vitals reviewed.  Constitutional:      Appearance: He is well-developed.  HENT:     Head: Normocephalic and atraumatic.  Eyes:     Pupils: Pupils are equal, round, and reactive to light.  Neck:     Vascular: No carotid bruit or JVD.  Cardiovascular:     Rate and Rhythm: Normal rate and regular rhythm.     Heart sounds: Normal heart sounds. No murmur heard.   Pulmonary:     Effort: Pulmonary effort is normal.     Breath sounds: Normal breath sounds. No rales.  Skin:    General: Skin is warm and dry.  Neurological:     Mental Status: He is alert and oriented to person, place, and time.     Assessment & Plan:  Todd [name] is a 80 y.o. male . Prediabetes - Plan: Hemoglobin A1c  - normal reading on last check, commended on weight loss. Repeat labs. If normal, yearly screening may be reasonable.   OSA on CPAP  - still working with sleep specialist for adjustment of settings. Nocturia impact from BPH improving after TURP.   Mixed hyperlipidemia - Plan: Comprehensive metabolic panel, Lipid panel  -minimal elevation. On otc/herbal supplements as above. Repeat labs.   Benign prostatic hyperplasia with nocturia  - improving post TURP - keep urology follow up.   No orders of the defined types were placed in this encounter.  Patient Instructions  I will check some lab work today, but your last prediabetes test looked okay and cholesterol is only borderline elevated.  I am hoping that you will continue to improve with the  urinary symptoms from your recent procedure.  Follow-up in December for wellness exam but please let me know if there are questions or concerns prior to that time.      Signed, Merri Ray, MD [facility]

## 2021-04-07 NOTE — Patient Instructions (Signed)
I will check some lab work today, but your last prediabetes test looked okay and cholesterol is only borderline elevated.  I am hoping that you will continue to improve with the urinary symptoms from your recent procedure.  Follow-up in December for wellness exam but please let me know if there are questions or concerns prior to that time.

## 2021-04-09 LAB — COMPREHENSIVE METABOLIC PANEL
ALT: 18 U/L (ref 0–53)
AST: 23 U/L (ref 0–37)
Albumin: 4.8 g/dL (ref 3.5–5.2)
Alkaline Phosphatase: 104 U/L (ref 39–117)
BUN: 18 mg/dL (ref 6–23)
CO2: 20 mEq/L (ref 19–32)
Calcium: 9.8 mg/dL (ref 8.4–10.5)
Chloride: 100 mEq/L (ref 96–112)
Creatinine, Ser: 1.1 mg/dL (ref 0.40–1.50)
GFR: 64.38 mL/min (ref >60.00)
Glucose, Bld: 89 mg/dL (ref 70–99)
Potassium: 4.5 mEq/L (ref 3.5–5.1)
Sodium: 141 mEq/L (ref 135–145)
Total Bilirubin: 0.6 mg/dL (ref 0.2–1.2)
Total Protein: 7.7 g/dL (ref 6.0–8.3)

## 2021-04-09 LAB — LIPID PANEL
Cholesterol: 215 mg/dL — ABNORMAL HIGH (ref 0–200)
HDL: 48.6 mg/dL (ref >39.00)
LDL Cholesterol: 144 mg/dL — ABNORMAL HIGH (ref 0–99)
NonHDL: 166
Total CHOL/HDL Ratio: 4
Triglycerides: 109 mg/dL (ref 0.0–149.0)
VLDL: 21.8 mg/dL (ref 0.0–40.0)

## 2021-04-14 ENCOUNTER — Telehealth: Payer: Self-pay | Admitting: Family Medicine

## 2021-04-14 ENCOUNTER — Encounter: Payer: Self-pay | Admitting: Family Medicine

## 2021-04-14 DIAGNOSIS — E785 Hyperlipidemia, unspecified: Secondary | ICD-10-CM

## 2021-04-14 NOTE — Telephone Encounter (Signed)
Patient called back and said that he would be willing to try the statin medication - please advise.

## 2021-04-16 MED ORDER — PRAVASTATIN SODIUM 20 MG PO TABS: 20.0000 mg | ORAL_TABLET | Freq: Every day | ORAL | 1 refills | Status: DC

## 2021-04-16 NOTE — Addendum Note (Signed)
Addended by: Merri Ray R on: 04/16/2021 02:25 PM   Modules accepted: Orders

## 2021-04-16 NOTE — Telephone Encounter (Signed)
Sent med

## 2021-05-04 ENCOUNTER — Encounter: Payer: Self-pay | Admitting: Adult Health

## 2021-05-04 ENCOUNTER — Ambulatory Visit (INDEPENDENT_AMBULATORY_CARE_PROVIDER_SITE_OTHER): Payer: Medicare Other | Admitting: Adult Health

## 2021-05-04 VITALS — BP 124/76 | HR 78 | Ht 71.0 in | Wt 203.0 lb

## 2021-05-04 DIAGNOSIS — G4733 Obstructive sleep apnea (adult) (pediatric): Secondary | ICD-10-CM

## 2021-05-04 DIAGNOSIS — Z9989 Dependence on other enabling machines and devices: Secondary | ICD-10-CM

## 2021-05-04 NOTE — Patient Instructions (Signed)
Your Plan:  Continue using CPAP nightly and greater than 4 hours each night Pressure increased from 10 cmH20 to 12cmH20 If your symptoms worsen or you develop new symptoms please let us know.    Thank you for coming to see [place] at [facility]. I hope we have been able to provide you high quality care today.  You may receive a patient satisfaction survey over the next few weeks. We would appreciate your feedback and comments so that we may continue to improve ourselves and the health of our patients.

## 2021-05-04 NOTE — Progress Notes (Addendum)
PATIENT: Todd [name] DOB: Mar 31, 1941  REASON FOR VISIT: follow up HISTORY FROM: patient PRIMARY NEUROLOGIST: Todd [name]  HISTORY OF PRESENT ILLNESS: Today 05/04/21:  Todd [name] is a 80 year old male with a history of obstructive sleep apnea on CPAP.  He returns today for follow-up.  At his last visit his pressure was set at 8 cmH2O increase to 10 cm of water his residual AHI has decreased from 31 events to 18 events.  The patient states that he is tolerating the pressure well.  He reports that he continues to get up several times a night to urinate his download is below.    HISTORY (copied from Todd [name] note) /12/2020: I reviewed his CPAP compliance data from 12/06/2020 through 01/04/2021, which is a total of 30 days, during which time he used his machine every night with percent use days greater than 4 hours at 100%, indicating superb compliance with average usage of 7 hours and 39 minutes, residual AHI highly elevated at 31.9/h, residual events appear to be obstructive in nature, pressure at 8 cm, leak acceptable with a 95th percentile at 20.9 L/min. He reports doing well, he tolerates the pressure so much better than the high pressure BiPAP, he eventually gave up using the BiPAP.  He feels like he is sleeping quite well.  He continues to use a full facemask and sometimes with position change he does notice the leak.  He is motivated to continue with treatment.  He was not aware that his AHI score was high.  He does report that when he stopped using his BiPAP and slept without treatment, he slept fairly well, his wife noticed less snoring overall compared to the past.  He had a recent checkup with his primary care, he is doing well.  He tries to stay active, does some resistance training and aerobic exercises, uses a pedaling device at home.  REVIEW OF SYSTEMS: Out of a complete 14 system review of symptoms, the patient complains only of the following symptoms, and all other reviewed systems are  negative.  ESS 10  ALLERGIES: Allergies  Allergen Reactions   Shellfish Allergy     Headaches, high blood pressure   Ace Inhibitors Cough   Amoxicillin Nausea And Vomiting   Darvon     Chest pain   Toprol Xl [Metoprolol Tartrate]     Not sure reaction    HOME MEDICATIONS: Outpatient Medications Prior to Visit  Medication Sig Dispense Refill   acetaminophen (TYLENOL) 500 MG tablet Take 1,000 mg by mouth every 6 (six) hours as needed.     Aspirin-Caffeine (BC FAST PAIN RELIEF ARTHRITIS PO) Take by mouth.     Digestive Enzymes (PAPAYA AND ENZYMES PO) Take by mouth.     fish oil-omega-3 fatty acids 1000 MG capsule daily. 3825 mg BID     GARLIC PO Take 053 mg by mouth 2 (two) times daily. 2 tabs bid per pt     Multiple Vitamin (MULTIVITAMIN) tablet Take 1 tablet by mouth daily.     pravastatin (PRAVACHOL) 20 MG tablet Take 1 tablet (20 mg total) by mouth daily. Can start few days per week and increase to QD if tolerated. 90 tablet 1   psyllium (METAMUCIL) 58.6 % powder Take 1 packet by mouth daily.     No facility-administered medications prior to visit.    PAST MEDICAL HISTORY: Past Medical History:  Diagnosis Date   Arthritis    hands   Bladder stone    BPH (  benign prostatic hyperplasia)    Cancer (HCC)    skin cancer multiple; followed by Todd [name] yearly.   Colon polyps     Previous polyps on colonoscopy.  [facility].     Hearing aid worn    both ears   Hiatal hernia 10/20/2013   EGD confirmed.  [facility].   History of Barrett's esophagus 2014   pt told by dr Todd [name] no barrett's esophagus 03-03-2021   History of hypertension    no meds since 2019   History of prediabetes    non pre dm since june 2021 weight loss   Hyperlipemia    Neuromuscular disorder (HCC)    numbness rt hand   Seasonal allergies    Sleep apnea    uses CPAP nightly    PAST SURGICAL HISTORY: Past Surgical History:  Procedure Laterality Date   BLADDER STONE REMOVAL  2011, 2018   CARPAL  TUNNEL RELEASE  01/17/2012   Procedure: CARPAL TUNNEL RELEASE;  Surgeon: Todd Sickle., [name];  Location: [place];  Service: Orthopedics;  Laterality: Right;   CERVICAL FUSION  1976   COLONOSCOPY  10/20/2013   [facility].  Polyps.    CYSTOSCOPY WITH LITHOLAPAXY N/A 01/09/2017   Procedure: CYSTOSCOPY WITH LITHOLAPAXY;  Surgeon: Todd [name];  Location[facility];  Service: Urology;  Laterality: N/A;   ESOPHAGOGASTRODUODENOSCOPY  10/20/2013   HH; biopsy of area concernig for Barrett's; gastritis.   EYE SURGERY  2012   both cataracts   FRACTURE SURGERY Right 1960's   3 fingers   HOLMIUM LASER APPLICATION N/A 0/11/6008   Procedure: HOLMIUM LASER APPLICATION;  Surgeon: Todd [name];  Location: [facility];  Service: Urology;  Laterality: N/A;   QUADRICEPS TENDON REPAIR  2003   bilateral   TRANSURETHRAL RESECTION OF PROSTATE N/A 03/08/2021   Procedure: TRANSURETHRAL RESECTION OF THE PROSTATE (TURP);  Surgeon: Todd [name];  Location[facility];  Service: Urology;  Laterality: N/A;   ULNAR NERVE TRANSPOSITION  01/17/2012   Procedure: ULNAR NERVE DECOMPRESSION/TRANSPOSITION;  Surgeon: Todd Sickle., [name];  Location: [place];  Service: Orthopedics;  Laterality: Right;  decompression of ulnar nerve only  at right cubital tunnel   ULNAR NERVE TRANSPOSITION Left 06/01/2017   Procedure: DECOMPRESSION ULNAR NERVE LEFT CUBITAL TUNNEL;  Surgeon: Todd [name];  Location: [place];  Service: Orthopedics;  Laterality: Left;   UPPER GASTROINTESTINAL ENDOSCOPY     VASECTOMY  yrs ago    FAMILY HISTORY: Family History  Problem Relation Age of Onset   Cirrhosis Father    Hypertension Father    Alcohol abuse Father    Heart failure Mother        in 29s   Arthritis Brother        Rheumatoid arthritis   Hypertension Brother     SOCIAL HISTORY: Social History   Socioeconomic  History   Marital status: Married    Spouse name: Not on file   Number of children: 2   Years of education: 12   Highest education level: Not on file  Occupational History   Occupation: Retired  Tobacco Use   Smoking status: Former    Packs/day: 2.00    Years: 15.00    Pack years: 30.00    Types: Cigarettes    Quit date: 01/11/1974    Years since quitting: 47.3   Smokeless tobacco: Never  Vaping Use   Vaping Use: Never used  Substance and Sexual Activity   Alcohol use: Yes    Alcohol/week: 1.0 standard drink    Types: 1 Standard drinks or equivalent per week    Comment: occ   Drug use: No   Sexual activity: Never  Other Topics Concern   Not on file  Social History Narrative   Marital status:  Married x 42 years      Children: 2 daughters; 3 grandchildren.      Employment; retired in 1998 from [employer]; retired age 39 from [employer] after 14 years.      Lives: with wife.      Tobacco: former smoker; quit at age 66.  Smoked 15 years.      Alcohol:  On vacation or special occasions.        Education: [employer].       Exercise: Gym 3-4 times a week for 2 hours.; exercises 7 days per week.   Elliptical for one hour.      Advanced Directives: none; +desires FULL CODE.        ADLs: independent with ADLs; drives.  Does not walk with assistant devices.     Drinks 3-4 caffeine drinks a day    Social Determinants of Radio broadcast assistant Strain: Not on file  Food Insecurity: Not on file  Transportation Needs: Not on file  Physical Activity: Not on file  Stress: Not on file  Social Connections: Not on file  Intimate Partner Violence: Not on file      PHYSICAL EXAM  Vitals:   05/04/21 0811  BP: 124/76  Pulse: 78  Weight: 203 lb (92.1 kg)  Height: 5\' 11"  (1.803 m)   Body mass index is 28.31 kg/m.  Generalized: Well developed, in no acute distress  Chest: Lungs clear to auscultation bilaterally  Neurological examination  Mentation:  Alert oriented to time, place, history taking. Follows all commands speech and language fluent Cranial nerve II-XII: Extraocular movements were full, visual field were full on confrontational test Head turning and shoulder shrug  were normal and symmetric. Motor: The motor testing reveals 5 over 5 strength of all 4 extremities. Good symmetric motor tone is noted throughout.  Sensory: Sensory testing is intact to soft touch on all 4 extremities. No evidence of extinction is noted.  Gait and station: Gait is normal.     DIAGNOSTIC DATA (LABS, IMAGING, TESTING) - I reviewed patient records, labs, notes, testing and imaging myself where available.  Lab Results  Component Value Date   WBC 6.4 05/03/2018   HGB 14.4 05/03/2018   HCT 41.2 05/03/2018   MCV 83.9 05/03/2018   PLT 226 05/03/2018      Component Value Date/Time   NA 141 04/07/2021 1104   NA 137 10/26/2020 1141   K 4.5 04/07/2021 1104   CL 100 04/07/2021 1104   CO2 20 04/07/2021 1104   GLUCOSE 89 04/07/2021 1104   BUN 18 04/07/2021 1104   BUN 15 10/26/2020 1141   CREATININE 1.10 04/07/2021 1104   CREATININE 0.89 08/23/2016 1016   CALCIUM 9.8 04/07/2021 1104   PROT 7.7 04/07/2021 1104   PROT 7.2 10/26/2020 1141   ALBUMIN 4.8 04/07/2021 1104   ALBUMIN 4.8 (H) 10/26/2020 1141   AST 23 04/07/2021 1104   ALT 18 04/07/2021 1104   ALKPHOS 104 04/07/2021 1104   BILITOT 0.6 04/07/2021 1104   BILITOT 0.8 10/26/2020 1141   GFRNONAA 79 10/26/2020 1141   GFRAA 91 10/26/2020 1141  Lab Results  Component Value Date   CHOL 215 (H) 04/07/2021   HDL 48.60 04/07/2021   LDLCALC 144 (H) 04/07/2021   TRIG 109.0 04/07/2021   CHOLHDL 4 04/07/2021   Lab Results  Component Value Date   HGBA1C 5.8 04/07/2021   No results found for: [id number] Lab Results  Component Value Date   TSH 1.200 03/18/2019      ASSESSMENT AND PLAN 80 y.o. year old male  has a past medical history of Arthritis, Bladder stone, BPH (benign prostatic  hyperplasia), Cancer ([place]), Colon polyps, Hearing aid worn, Hiatal hernia (10/20/2013), History of Barrett's esophagus (2014), History of hypertension, History of prediabetes, Hyperlipemia, Neuromuscular disorder ([place]), Seasonal allergies, and Sleep apnea. here with:  1.  Obstructive sleep apnea on CPAP  --Good compliance --Residual AHI has decreased from 31 events to 18 events --Pressure was increased from 10 cm of water to 12 cm of water --Patient will call in 1 month and we will pull another download to see if his events have further decrease. --Follow-up in 6 months or sooner if needed     Ward Givens, MSN, NP-C 05/04/2021, 8:33 AM [facility] [address] [place], [place] [address] [phone]  I reviewed the above note and documentation by the Nurse Practitioner and agree with the history, exam, assessment and plan as outlined above. I was available for consultation. Star Age, MD, PhD [facility])

## 2021-08-24 ENCOUNTER — Telehealth: Payer: Self-pay | Admitting: Family Medicine

## 2021-08-24 NOTE — Telephone Encounter (Signed)
Called pt he reports has been trying aquaphor on the corners of his mouth with no relief for several weeks he reports dentist this morning gave him a different product to try, advised he try that the rest of the week and if no changes then call back for an appointment

## 2021-08-24 NOTE — Telephone Encounter (Signed)
Sounds like angular cheilitis.  This can be from a number different things, but often from salivation, oral hygiene or denture fit issues that can cause irritation at that area, sometimes with a secondary yeast infection.  Initial approach would be to use a barrier like Vaseline/petrolatum gel in the corners of the mouth at bedtime.  If that is not helping, please schedule visit with me in the next week or 2 and we can swab for possible fungal infection or look into other treatment options.  Follow-up sooner if worsening.

## 2021-08-24 NOTE — Telephone Encounter (Signed)
Patient called stating that he is having problems with his mouth cracking open on the corners.  Dentist told him it could be his cpap machine or that it could be the prevastatin that patient takes.  He would like to know your thoughts on this.Please call patient at [phone]

## 2021-10-09 ENCOUNTER — Other Ambulatory Visit: Payer: Self-pay | Admitting: Family Medicine

## 2021-10-09 DIAGNOSIS — E785 Hyperlipidemia, unspecified: Secondary | ICD-10-CM

## 2021-10-11 ENCOUNTER — Ambulatory Visit: Payer: Medicare Other

## 2021-10-13 ENCOUNTER — Encounter: Payer: Medicare Other | Admitting: Family Medicine

## 2021-10-14 ENCOUNTER — Ambulatory Visit (INDEPENDENT_AMBULATORY_CARE_PROVIDER_SITE_OTHER): Payer: Medicare Other

## 2021-10-14 DIAGNOSIS — Z0000 Encounter for general adult medical examination without abnormal findings: Secondary | ICD-10-CM

## 2021-10-14 NOTE — Patient Instructions (Signed)
Todd [name] , Thank you for taking time to come for your Medicare Wellness Visit. I appreciate your ongoing commitment to your health goals. Please review the following plan we discussed and let me know if I can assist you in the future.   Screening recommendations/referrals: Colonoscopy: no longer required Recommended yearly ophthalmology/optometry visit for glaucoma screening and checkup Recommended yearly dental visit for hygiene and checkup  Vaccinations: Influenza vaccine: completed  Pneumococcal vaccine: completed  Tdap vaccine: 05/23/2014 Shingles vaccine: completed     Advanced directives: will provide copies  Conditions/risks identified: none   Next appointment: none   Preventive Care 78 Years and Older, Male Preventive care refers to lifestyle choices and visits with your health care provider that can promote health and wellness. What does preventive care include? A yearly physical exam. This is also called an annual well check. Dental exams once or twice a year. Routine eye exams. Ask your health care provider how often you should have your eyes checked. Personal lifestyle choices, including: Daily care of your teeth and gums. Regular physical activity. Eating a healthy diet. Avoiding tobacco and drug use. Limiting alcohol use. Practicing safe sex. Taking low doses of aspirin every day. Taking vitamin and mineral supplements as recommended by your health care provider. What happens during an annual well check? The services and screenings done by your health care provider during your annual well check will depend on your age, overall health, lifestyle risk factors, and family history of disease. Counseling  Your health care provider may ask you questions about your: Alcohol use. Tobacco use. Drug use. Emotional well-being. Home and relationship well-being. Sexual activity. Eating habits. History of falls. Memory and ability to understand (cognition). Work and work  Statistician. Screening  You may have the following tests or measurements: Height, weight, and BMI. Blood pressure. Lipid and cholesterol levels. These may be checked every 5 years, or more frequently if you are over 78 years old. Skin check. Lung cancer screening. You may have this screening every year starting at age 78 if you have a 30-pack-year history of smoking and currently smoke or have quit within the past 15 years. Fecal occult blood test (FOBT) of the stool. You may have this test every year starting at age 78. Flexible sigmoidoscopy or colonoscopy. You may have a sigmoidoscopy every 5 years or a colonoscopy every 10 years starting at age 78. Prostate cancer screening. Recommendations will vary depending on your family history and other risks. Hepatitis C blood test. Hepatitis B blood test. Sexually transmitted disease (STD) testing. Diabetes screening. This is done by checking your blood sugar (glucose) after you have not eaten for a while (fasting). You may have this done every 1-3 years. Abdominal aortic aneurysm (AAA) screening. You may need this if you are a current or former smoker. Osteoporosis. You may be screened starting at age 78 if you are at high risk. Talk with your health care provider about your test results, treatment options, and if necessary, the need for more tests. Vaccines  Your health care provider may recommend certain vaccines, such as: Influenza vaccine. This is recommended every year. Tetanus, diphtheria, and acellular pertussis (Tdap, Td) vaccine. You may need a Td booster every 10 years. Zoster vaccine. You may need this after age 78. Pneumococcal 13-valent conjugate (PCV13) vaccine. One dose is recommended after age 78. Pneumococcal polysaccharide (PPSV23) vaccine. One dose is recommended after age 78 Talk to your health care provider about which screenings and vaccines you need and how  often you need them. This information is not intended to replace  advice given to you by your health care provider. Make sure you discuss any questions you have with your health care provider. Document Released: 11/20/2015 Document Revised: 07/13/2016 Document Reviewed: 08/25/2015 Elsevier Interactive Patient Education  2017 [place] Prevention in the Home Falls can cause injuries. They can happen to people of all ages. There are many things you can do to make your home safe and to help prevent falls. What can I do on the outside of my home? Regularly fix the edges of walkways and driveways and fix any cracks. Remove anything that might make you trip as you walk through a door, such as a raised step or threshold. Trim any bushes or trees on the path to your home. Use bright outdoor lighting. Clear any walking paths of anything that might make someone trip, such as rocks or tools. Regularly check to see if handrails are loose or broken. Make sure that both sides of any steps have handrails. Any raised decks and porches should have guardrails on the edges. Have any leaves, snow, or ice cleared regularly. Use sand or salt on walking paths during winter. Clean up any spills in your garage right away. This includes oil or grease spills. What can I do in the bathroom? Use night lights. Install grab bars by the toilet and in the tub and shower. Do not use towel bars as grab bars. Use non-skid mats or decals in the tub or shower. If you need to sit down in the shower, use a plastic, non-slip stool. Keep the floor dry. Clean up any water that spills on the floor as soon as it happens. Remove soap buildup in the tub or shower regularly. Attach bath mats securely with double-sided non-slip rug tape. Do not have throw rugs and other things on the floor that can make you trip. What can I do in the bedroom? Use night lights. Make sure that you have a light by your bed that is easy to reach. Do not use any sheets or blankets that are too big for your bed.  They should not hang down onto the floor. Have a firm chair that has side arms. You can use this for support while you get dressed. Do not have throw rugs and other things on the floor that can make you trip. What can I do in the kitchen? Clean up any spills right away. Avoid walking on wet floors. Keep items that you use a lot in easy-to-reach places. If you need to reach something above you, use a strong step stool that has a grab bar. Keep electrical cords out of the way. Do not use floor polish or wax that makes floors slippery. If you must use wax, use non-skid floor wax. Do not have throw rugs and other things on the floor that can make you trip. What can I do with my stairs? Do not leave any items on the stairs. Make sure that there are handrails on both sides of the stairs and use them. Fix handrails that are broken or loose. Make sure that handrails are as long as the stairways. Check any carpeting to make sure that it is firmly attached to the stairs. Fix any carpet that is loose or worn. Avoid having throw rugs at the top or bottom of the stairs. If you do have throw rugs, attach them to the floor with carpet tape. Make sure that you have a  light switch at the top of the stairs and the bottom of the stairs. If you do not have them, ask someone to add them for you. What else can I do to help prevent falls? Wear shoes that: Do not have high heels. Have rubber bottoms. Are comfortable and fit you well. Are closed at the toe. Do not wear sandals. If you use a stepladder: Make sure that it is fully opened. Do not climb a closed stepladder. Make sure that both sides of the stepladder are locked into place. Ask someone to hold it for you, if possible. Clearly mark and make sure that you can see: Any grab bars or handrails. First and last steps. Where the edge of each step is. Use tools that help you move around (mobility aids) if they are needed. These  include: Canes. Walkers. Scooters. Crutches. Turn on the lights when you go into a dark area. Replace any light bulbs as soon as they burn out. Set up your furniture so you have a clear path. Avoid moving your furniture around. If any of your floors are uneven, fix them. If there are any pets around you, be aware of where they are. Review your medicines with your doctor. Some medicines can make you feel dizzy. This can increase your chance of falling. Ask your doctor what other things that you can do to help prevent falls. This information is not intended to replace advice given to you by your health care provider. Make sure you discuss any questions you have with your health care provider. Document Released: 08/20/2009 Document Revised: 03/31/2016 Document Reviewed: 11/28/2014 Elsevier Interactive Patient Education  2017 [employer].

## 2021-10-14 NOTE — Progress Notes (Signed)
Subjective:   Todd [name] is a 80 y.o. male who presents for an Subsequent  Medicare Annual Wellness Visit.  I connected with Todd [name] today by telephone and verified that I am speaking with the correct person using two identifiers. Location patient: home Location provider: work Persons participating in the virtual visit: patient, provider.   I discussed the limitations, risks, security and privacy concerns of performing an evaluation and management service by telephone and the availability of in person appointments. I also discussed with the patient that there may be a patient responsible charge related to this service. The patient expressed understanding and verbally consented to this telephonic visit.    Interactive audio and video telecommunications were attempted between this provider and patient, however failed, due to patient having technical difficulties OR patient did not have access to video capability.  We continued and completed visit with audio only.     Review of Systems     Cardiac Risk Factors include: advanced age (>6men, >22 women);dyslipidemia;male gender     Objective:    Today's Vitals   There is no height or weight on file to calculate BMI.  Advanced Directives 10/14/2021 03/08/2021 10/07/2020 09/03/2018 05/03/2018 02/26/2018 08/22/2017  Does Patient Have a Medical Advance Directive? Yes Yes Yes Yes No Yes Yes  Type of Paramedic of [place];Living will - [place];Living will [place];Living will - Living will [place];Living will  Does patient want to make changes to medical advance directive? - No - Guardian declined - No - Patient declined - No - Patient declined -  Copy of [place] in Chart? No - copy requested - No - copy requested Yes - - No - copy requested  Would patient like information on creating a medical advance directive? - - - - No - Patient  declined - -  Pre-existing out of facility DNR order (yellow form or pink MOST form) - - - - - - -    Current Medications (verified) Outpatient Encounter Medications as of 10/14/2021  Medication Sig   acetaminophen (TYLENOL) 500 MG tablet Take 1,000 mg by mouth every 6 (six) hours as needed.   Aspirin-Caffeine (BC FAST PAIN RELIEF ARTHRITIS PO) Take by mouth.   Digestive Enzymes (PAPAYA AND ENZYMES PO) Take by mouth.   fish oil-omega-3 fatty acids 1000 MG capsule daily. 7106 mg BID   GARLIC PO Take 269 mg by mouth 2 (two) times daily. 2 tabs bid per pt   Multiple Vitamin (MULTIVITAMIN) tablet Take 1 tablet by mouth daily.   pravastatin (PRAVACHOL) 20 MG tablet TAKE 1 TABLET BY MOUTH EVERY DAY CAN START FEW DAYS PER WEEK AND INCREASE IF TOLERATED   psyllium (METAMUCIL) 58.6 % powder Take 1 packet by mouth daily.   No facility-administered encounter medications on file as of 10/14/2021.    Allergies (verified) Shellfish allergy, Ace inhibitors, Amoxicillin, Darvon, and Toprol xl [metoprolol tartrate]   History: Past Medical History:  Diagnosis Date   Arthritis    hands   Bladder stone    BPH (benign prostatic hyperplasia)    Cancer ([place])    skin cancer multiple; followed by [facility]/dermatology yearly.   Colon polyps     Previous polyps on colonoscopy.  [facility].     Hearing aid worn    both ears   Hiatal hernia 10/20/2013   EGD confirmed.  [facility].   History of Barrett's esophagus 2014   pt told  by dr Watt Climes no barrett's esophagus 03-03-2021   History of hypertension    no meds since 2019   History of prediabetes    non pre dm since june 2021 weight loss   Hyperlipemia    Neuromuscular disorder (HCC)    numbness rt hand   Seasonal allergies    Sleep apnea    uses CPAP nightly   Past Surgical History:  Procedure Laterality Date   BLADDER STONE REMOVAL  2011, 2018   CARPAL TUNNEL RELEASE  01/17/2012   Procedure: CARPAL TUNNEL RELEASE;  Surgeon: Cammie Sickle., MD;  Location:  [place];  Service: Orthopedics;  Laterality: Right;   CERVICAL FUSION  1976   COLONOSCOPY  10/20/2013   [facility].  Polyps.    CYSTOSCOPY WITH LITHOLAPAXY N/A 01/09/2017   Procedure: CYSTOSCOPY WITH LITHOLAPAXY;  Surgeon: Franchot Gallo, MD;  Location: [facility];  Service: Urology;  Laterality: N/A;   ESOPHAGOGASTRODUODENOSCOPY  10/20/2013   [facility]; biopsy of area concernig for Barrett's; gastritis.   EYE SURGERY  2012   both cataracts   FRACTURE SURGERY Right 1960's   3 fingers   HOLMIUM LASER APPLICATION N/A 07/11/8545   Procedure: HOLMIUM LASER APPLICATION;  Surgeon: Franchot Gallo, MD;  Location: [facility];  Service: Urology;  Laterality: N/A;   QUADRICEPS TENDON REPAIR  2003   bilateral   TRANSURETHRAL RESECTION OF PROSTATE N/A 03/08/2021   Procedure: TRANSURETHRAL RESECTION OF THE PROSTATE (TURP);  Surgeon: Franchot Gallo, MD;  Location: [facility];  Service: Urology;  Laterality: N/A;   ULNAR NERVE TRANSPOSITION  01/17/2012   Procedure: ULNAR NERVE DECOMPRESSION/TRANSPOSITION;  Surgeon: Cammie Sickle., MD;  Location: [place];  Service: Orthopedics;  Laterality: Right;  decompression of ulnar nerve only  at right cubital tunnel   ULNAR NERVE TRANSPOSITION Left 06/01/2017   Procedure: DECOMPRESSION ULNAR NERVE LEFT CUBITAL TUNNEL;  Surgeon: Leanora Cover, MD;  Location: [place];  Service: Orthopedics;  Laterality: Left;   UPPER GASTROINTESTINAL ENDOSCOPY     VASECTOMY  yrs ago   Family History  Problem Relation Age of Onset   Cirrhosis Father    Hypertension Father    Alcohol abuse Father    Heart failure Mother        in 66s   Arthritis Brother        Rheumatoid arthritis   Hypertension Brother    Social History   Socioeconomic History   Marital status: Married    Spouse name: Not on file   Number of children: 2   Years of education: 12   Highest education level: Not  on file  Occupational History   Occupation: Retired  Tobacco Use   Smoking status: Former    Packs/day: 2.00    Years: 15.00    Pack years: 30.00    Types: Cigarettes    Quit date: 01/11/1974    Years since quitting: 47.7   Smokeless tobacco: Never  Vaping Use   Vaping Use: Never used  Substance and Sexual Activity   Alcohol use: Yes    Alcohol/week: 1.0 standard drink    Types: 1 Standard drinks or equivalent per week    Comment: occ   Drug use: No   Sexual activity: Never  Other Topics Concern   Not on file  Social History Narrative   Marital status:  Married x 42 years      Children: 2 daughters; 3 grandchildren.  Employment; retired in 1998 from [employer]; retired age 81 from [employer] after 14 years.      Lives: with wife.      Tobacco: former smoker; quit at age 18.  Smoked 15 years.      Alcohol:  On vacation or special occasions.        Education: [employer].       Exercise: Gym 3-4 times a week for 2 hours.; exercises 7 days per week.   Elliptical for one hour.      Advanced Directives: none; +desires FULL CODE.        ADLs: independent with ADLs; drives.  Does not walk with assistant devices.     Drinks 3-4 caffeine drinks a day    Social Determinants of Radio broadcast assistant Strain: Low Risk    Difficulty of Paying Living Expenses: Not hard at all  Food Insecurity: No Food Insecurity   Worried About Charity fundraiser in the Last Year: Never true   Arboriculturist in the Last Year: Never true  Transportation Needs: No Transportation Needs   Lack of Transportation (Medical): No   Lack of Transportation (Non-Medical): No  Physical Activity: Sufficiently Active   Days of Exercise per Week: 4 days   Minutes of Exercise per Session: 40 min  Stress: No Stress Concern Present   Feeling of Stress : Not at all  Social Connections: Socially Integrated   Frequency of Communication with Friends and Family: Twice a week   Frequency of  Social Gatherings with Friends and Family: Twice a week   Attends Religious Services: More than 4 times per year   Active Member of [employer] or Organizations: Yes   Attends Archivist Meetings: 1 to 4 times per year   Marital Status: Married    Tobacco Counseling Counseling given: Not Answered   Clinical Intake:  Pre-visit preparation completed: Yes  Pain : No/denies pain     Nutritional Risks: None Diabetes: No  How often do you need to have someone help you when you read instructions, pamphlets, or other written materials from your doctor or pharmacy?: 1 - Never What is the last grade level you completed in school?: High School  Diabetic?no   Interpreter Needed?: No  Information entered by :: [place] of Daily Living In your present state of health, do you have any difficulty performing the following activities: 10/14/2021 03/08/2021  Hearing? N N  Vision? N N  Difficulty concentrating or making decisions? N N  Walking or climbing stairs? N N  Dressing or bathing? N N  Doing errands, shopping? N -  Preparing Food and eating ? N -  Using the Toilet? N -  In the past six months, have you accidently leaked urine? N -  Do you have problems with loss of bowel control? N -  Managing your Medications? N -  Managing your Finances? N -  Housekeeping or managing your Housekeeping? N -  Some recent data might be hidden    Patient Care Team: Wendie Agreste, MD as PCP - General (Family Medicine) Marygrace Drought, MD as Consulting Physician (Ophthalmology) Franchot Gallo, MD as Consulting Physician (Urology)  Indicate any recent Medical Services you may have received from other than [facility] providers in the past year (date may be approximate).     Assessment:   This is a routine wellness examination for Jomarion.  Hearing/Vision screen No results found.  Dietary issues  and exercise activities discussed: Current Exercise Habits: Home exercise  routine, Type of exercise: walking, Time (Minutes): 40, Frequency (Times/Week): 4, Weekly Exercise (Minutes/Week): 160, Intensity: Mild   Goals Addressed   None    Depression Screen PHQ 2/9 Scores 10/14/2021 10/14/2021 04/07/2021 10/07/2020 10/07/2020 03/02/2020 09/10/2019  PHQ - 2 Score 0 0 0 0 0 0 0  PHQ- 9 Score - - 0 - - - -    Fall Risk Fall Risk  10/14/2021 04/07/2021 10/07/2020 10/07/2020 03/02/2020  Falls in the past year? 0 0 0 0 0  Number falls in past yr: 0 - - 0 0  Injury with Fall? 0 - - 0 0  Comment - - - - -  Risk for fall due to : - - - - -  Follow up Falls evaluation completed Falls evaluation completed Falls evaluation completed Falls evaluation completed;Education provided Falls evaluation completed    FALL RISK PREVENTION PERTAINING TO THE HOME:  Any stairs in or around the home? No  If so, are there any without handrails? No  Home free of loose throw rugs in walkways, pet beds, electrical cords, etc? Yes  Adequate lighting in your home to reduce risk of falls? Yes   ASSISTIVE DEVICES UTILIZED TO PREVENT FALLS:  Life alert? No  Use of a cane, walker or w/c? No  Grab bars in the bathroom? Yes  Shower chair or bench in shower? Yes  Elevated toilet seat or a handicapped toilet? Yes   Cognitive Function:  Normal cognitive status assessed by direct observation by this Nurse Health Advisor. No abnormalities found.     6CIT Screen 10/07/2020 09/10/2019 09/03/2018 08/22/2017  What Year? 0 points 0 points 0 points 0 points  What month? 0 points 0 points 0 points 0 points  What time? 0 points 0 points 0 points 0 points  Count back from 20 0 points 0 points 0 points 0 points  Months in reverse 0 points 0 points 0 points 0 points  Repeat phrase 0 points 0 points 0 points 2 points  Total Score 0 0 0 2    Immunizations Immunization History  Administered Date(s) Administered   Influenza Inj Mdck Quad Pf 08/13/2019   Influenza, High Dose Seasonal PF 07/24/2018    Influenza,inj,Quad PF,6+ Mos 07/15/2013, 07/21/2014, 07/07/2015, 07/06/2016, 08/04/2017   Influenza-Unspecified 08/12/2020   PFIZER(Purple Top)SARS-COV-2 Vaccination 12/15/2019, 01/09/2020, 09/10/2020   Pneumococcal Conjugate-13 08/11/2014   Pneumococcal Polysaccharide-23 03/10/2008, 08/23/2016   Tdap 06/14/2010, 05/23/2014   Zoster Recombinat (Shingrix) 09/11/2017, 02/02/2018   Zoster, Live 11/07/2010    TDAP status: Up to date  Flu Vaccine status: Up to date  Pneumococcal vaccine status: Up to date  Covid-19 vaccine status: Completed vaccines  Qualifies for Shingles Vaccine? Yes   Zostavax completed Yes   Shingrix Completed?: Yes  Screening Tests Health Maintenance  Topic Date Due   COVID-19 Vaccine (4 - Booster for Pfizer series) 11/05/2020   INFLUENZA VACCINE  06/07/2021   TETANUS/TDAP  05/23/2024   Pneumonia Vaccine 93+ Years old  Completed   Hepatitis C Screening  Completed   Zoster Vaccines- Shingrix  Completed   HPV VACCINES  Aged Out    Health Maintenance  Health Maintenance Due  Topic Date Due   COVID-19 Vaccine (4 - Booster for Pfizer series) 11/05/2020   INFLUENZA VACCINE  06/07/2021    Colorectal cancer screening: No longer required.   Lung Cancer Screening: (Low Dose CT Chest recommended if Age 16-80 years, 30 pack-year currently smoking OR  have quit w/in 15years.) does not qualify.   Lung Cancer Screening Referral: n/a  Additional Screening:  Hepatitis C Screening: does not qualify;   Vision Screening: Recommended annual ophthalmology exams for early detection of glaucoma and other disorders of the eye. Is the patient up to date with their annual eye exam?  Yes  Who is the provider or what is the name of the office in which the patient attends annual eye exams? Dr.Tanner If pt is not established with a provider, would they like to be referred to a provider to establish care? No .   Dental Screening: Recommended annual dental exams for proper  oral hygiene  Community Resource Referral / Chronic Care Management: CRR required this visit?  No   CCM required this visit?  No      Plan:     I have personally reviewed and noted the following in the patient's chart:   Medical and social history Use of alcohol, tobacco or illicit drugs  Current medications and supplements including opioid prescriptions. Patient is not currently taking opioid prescriptions. Functional ability and status Nutritional status Physical activity Advanced directives List of other physicians Hospitalizations, surgeries, and ER visits in previous 12 months Vitals Screenings to include cognitive, depression, and falls Referrals and appointments  In addition, I have reviewed and discussed with patient certain preventive protocols, quality metrics, and best practice recommendations. A written personalized care plan for preventive services as well as general preventive health recommendations were provided to patient.     Randel Pigg, LPN   [id number]   Nurse Notes: none

## 2021-10-18 ENCOUNTER — Ambulatory Visit (INDEPENDENT_AMBULATORY_CARE_PROVIDER_SITE_OTHER): Payer: Medicare Other | Admitting: Family Medicine

## 2021-10-18 ENCOUNTER — Encounter: Payer: Self-pay | Admitting: Family Medicine

## 2021-10-18 VITALS — BP 136/78 | HR 72 | Temp 98.2°F | Resp 16 | Ht 71.0 in | Wt 205.6 lb

## 2021-10-18 DIAGNOSIS — R202 Paresthesia of skin: Secondary | ICD-10-CM

## 2021-10-18 DIAGNOSIS — R252 Cramp and spasm: Secondary | ICD-10-CM

## 2021-10-18 DIAGNOSIS — Z130 Encounter for screening for diseases of the blood and blood-forming organs and certain disorders involving the immune mechanism: Secondary | ICD-10-CM | POA: Diagnosis not present

## 2021-10-18 DIAGNOSIS — R7303 Prediabetes: Secondary | ICD-10-CM | POA: Diagnosis not present

## 2021-10-18 DIAGNOSIS — R200 Anesthesia of skin: Secondary | ICD-10-CM | POA: Diagnosis not present

## 2021-10-18 DIAGNOSIS — Z0000 Encounter for general adult medical examination without abnormal findings: Secondary | ICD-10-CM

## 2021-10-18 DIAGNOSIS — Z125 Encounter for screening for malignant neoplasm of prostate: Secondary | ICD-10-CM

## 2021-10-18 DIAGNOSIS — E782 Mixed hyperlipidemia: Secondary | ICD-10-CM | POA: Diagnosis not present

## 2021-10-18 LAB — TSH: TSH: 1.5 u[IU]/mL (ref 0.35–5.50)

## 2021-10-18 LAB — COMPREHENSIVE METABOLIC PANEL
ALT: 18 U/L (ref 0–53)
AST: 24 U/L (ref 0–37)
Albumin: 4.7 g/dL (ref 3.5–5.2)
Alkaline Phosphatase: 89 U/L (ref 39–117)
BUN: 16 mg/dL (ref 6–23)
CO2: 28 mEq/L (ref 19–32)
Calcium: 9.4 mg/dL (ref 8.4–10.5)
Chloride: 101 mEq/L (ref 96–112)
Creatinine, Ser: 0.94 mg/dL (ref 0.40–1.50)
GFR: 77.45 mL/min (ref >60.00)
Glucose, Bld: 89 mg/dL (ref 70–99)
Potassium: 4 mEq/L (ref 3.5–5.1)
Sodium: 138 mEq/L (ref 135–145)
Total Bilirubin: 1.1 mg/dL (ref 0.2–1.2)
Total Protein: 7.2 g/dL (ref 6.0–8.3)

## 2021-10-18 LAB — LIPID PANEL
Cholesterol: 154 mg/dL (ref 0–200)
HDL: 51.5 mg/dL (ref >39.00)
LDL Cholesterol: 78 mg/dL (ref 0–99)
NonHDL: 102.87
Total CHOL/HDL Ratio: 3
Triglycerides: 126 mg/dL (ref 0.0–149.0)
VLDL: 25.2 mg/dL (ref 0.0–40.0)

## 2021-10-18 LAB — CBC WITH DIFFERENTIAL/PLATELET
Basophils Absolute: 0 10*3/uL (ref 0.0–0.1)
Basophils Relative: 0.6 % (ref 0.0–3.0)
Eosinophils Absolute: 0.1 10*3/uL (ref 0.0–0.7)
Eosinophils Relative: 2.6 % (ref 0.0–5.0)
HCT: 44.2 % (ref 39.0–52.0)
Hemoglobin: 14.8 g/dL (ref 13.0–17.0)
Lymphocytes Relative: 22.5 % (ref 12.0–46.0)
Lymphs Abs: 1 10*3/uL (ref 0.7–4.0)
MCHC: 33.4 g/dL (ref 30.0–36.0)
MCV: 87.2 fl (ref 78.0–100.0)
Monocytes Absolute: 0.5 10*3/uL (ref 0.1–1.0)
Monocytes Relative: 11.6 % (ref 3.0–12.0)
Neutro Abs: 2.7 10*3/uL (ref 1.4–7.7)
Neutrophils Relative %: 62.7 % (ref 43.0–77.0)
Platelets: 196 10*3/uL (ref 150.0–400.0)
RBC: 5.07 Mil/uL (ref 4.22–5.81)
RDW: 14 % (ref 11.5–15.5)
WBC: 4.3 10*3/uL (ref 4.0–10.5)

## 2021-10-18 LAB — VITAMIN B12: Vitamin B-12: 377 pg/mL (ref 211–911)

## 2021-10-18 LAB — HEMOGLOBIN A1C: Hgb A1c MFr Bld: 5.8 % (ref 4.6–6.5)

## 2021-10-18 NOTE — Progress Notes (Signed)
Subjective:  Patient ID: Todd [name], male    DOB: 08/19/1941  Age: 80 y.o. MRN: [id number]  CC:  Chief Complaint  Patient presents with   Medicare Wellness    Pt here for annual exam, no concerns at this time, reports sees eye dr tomorrow for annual as well     HPI Despina Hick presents for   Presents for annual wellness exam.   Care team: PCP: me Urology, Dr. Diona Fanti. 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  History of bladder stones.  Continued monitoring/prevention.  Transurethral resection of prostate in May of this year, appt in January.  Ophthalmology, Dr. Satira Sark, exam tomorrow am.  Neurology/sleep, Dr. Rexene Alberts.  OSA on CPAP.  Appointment with nurse practitioner 05/04/21. Using cpap.  Dermatology: Dr. Nevada Crane  Other concerns:  Tingling in feet: Both feet, 4-5 years. Notices during the day and night. No back pain, no leg numbness. No incontinence. Symptoms stable, no color changes in toes, no swelling. Cramps in feet - toes and arch at times - notices with stretching out foot - pulling felling. No injury. Wearing compression stockings at night helps.  No other  treatments.   Bilateral knee issue: Popping and cracking at times in knees, not painful. Not limiting function. Not changing activity. Asking about visco.  Prediabetes: A1c borderline in June.  Weight up a few pounds with some less exercise after surgery. Lab Results  Component Value Date   HGBA1C 5.8 04/07/2021   Wt Readings from Last 3 Encounters:  10/18/21 205 lb 9.6 oz (93.3 kg)  05/04/21 203 lb (92.1 kg)  04/07/21 194 lb 9.6 oz (88.3 kg)    Hyperlipidemia: Pravastatin 20 mg daily Started after elevated labs in June. Taking daily, no side effects. No change in foot issues. Fasting today.  Lab Results  Component Value Date   CHOL 215 (H) 04/07/2021   HDL 48.60 04/07/2021   LDLCALC 144 (H) 04/07/2021   TRIG 109.0 04/07/2021   CHOLHDL 4 04/07/2021   Lab Results  Component Value Date   ALT 18 04/07/2021   AST 23  04/07/2021   ALKPHOS 104 04/07/2021   BILITOT 0.6 04/07/2021    Fall screening Fall Risk  10/18/2021 10/14/2021 04/07/2021 10/07/2020 10/07/2020  Falls in the past year? 0 0 0 0 0  Number falls in past yr: 0 0 - - 0  Injury with Fall? 0 0 - - 0  Comment - - - - -  Risk for fall due to : No Fall Risks - - - -  Follow up Falls evaluation completed Falls evaluation completed Falls evaluation completed Falls evaluation completed Falls evaluation completed;Education provided   Lighting in home:adequate.  Loose rugs/carpets/pets:none Stairs:none.  Grab bars in bathroom:does have.  Timed up and go:10 seconds, normal gait, no instability.   Depression Screening: Depression screen [facility] 2/9 10/18/2021 10/14/2021 10/14/2021 04/07/2021 10/07/2020  Decreased Interest 0 0 0 0 0  Down, Depressed, Hopeless 0 0 0 0 0  PHQ - 2 Score 0 0 0 0 0  Altered sleeping - - - 0 -  Tired, decreased energy - - - 0 -  Change in appetite - - - 0 -  Feeling bad or failure about yourself  - - - 0 -  Trouble concentrating - - - 0 -  Moving slowly or fidgety/restless - - - 0 -  Suicidal thoughts - - - 0 -  PHQ-9 Score - - - 0 -    Cancer Screening: Colonoscopy March 2020, Dr. Watt Climes.  Hyperplastic polyp. Followed by urology for BPH status post TURP as above. Skin:has had prior biopsies, derm eval 1 week.   Immunization History  Administered Date(s) Administered   Influenza Inj Mdck Quad Pf 08/13/2019   Influenza, High Dose Seasonal PF 07/24/2018   Influenza,inj,Quad PF,6+ Mos 07/15/2013, 07/21/2014, 07/07/2015, 07/06/2016, 08/04/2017   Influenza-Unspecified 08/12/2020   PFIZER(Purple Top)SARS-COV-2 Vaccination 12/15/2019, 01/09/2020, 09/10/2020   Pneumococcal Conjugate-13 08/11/2014   Pneumococcal Polysaccharide-23 03/10/2008, 08/23/2016   Tdap 06/14/2010, 05/23/2014   Zoster Recombinat (Shingrix) 09/11/2017, 02/02/2018   Zoster, Live 11/07/2010  Flu vaccine: [facility].  COVID booster: [facility] recently.    Functional Status Survey: Is the patient deaf or have difficulty hearing?: No Does the patient have difficulty seeing, even when wearing glasses/contacts?: No Does the patient have difficulty concentrating, remembering, or making decisions?: No Does the patient have difficulty walking or climbing stairs?: No Does the patient have difficulty dressing or bathing?: No Does the patient have difficulty doing errands alone such as visiting a doctor's office or shopping?: No  Memory Screen: 6CIT Screen 10/18/2021 10/07/2020 09/10/2019 09/03/2018 08/22/2017  What Year? 0 points 0 points 0 points 0 points 0 points  What month? 0 points 0 points 0 points 0 points 0 points  What time? 0 points 0 points 0 points 0 points 0 points  Count back from 20 0 points 0 points 0 points 0 points 0 points  Months in reverse 0 points 0 points 0 points 0 points 0 points  Repeat phrase 0 points 0 points 0 points 0 points 2 points  Total Score 0 0 0 0 2    Alcohol Screening: [place] Office Visit from 10/18/2021 in [facility] [facility]  AUDIT-C Score 0       Tobacco: none.    No results found. Optho/optometry:appt tomorrow.   Dental: every 6 months.    Exercise: Daily  - resistance, stretching, aerobic.   Advanced Directives:  Has HCPOA and Living Will  History Patient Active Problem List   Diagnosis Date Noted   Enlarged prostate with urinary obstruction 03/08/2021   Prediabetes 03/07/2019   Bladder stone 03/20/2017   Nephrolithiasis 03/20/2017   Sensorineural hearing loss (SNHL) of both ears 03/20/2017   OSA on CPAP 02/21/2017   Primary osteoarthritis of both hands 03/01/2016   Mixed hyperlipidemia 03/01/2016   Seasonal allergic rhinitis due to pollen 03/01/2016   BMI 31.0-31.9,adult 03/01/2016   Obesity 03/01/2016   Angioedema 02/16/2013   Past Medical History:  Diagnosis Date   Arthritis    hands   Bladder stone    BPH (benign prostatic  hyperplasia)    Cancer ([place])    skin cancer multiple; followed by [facility]/dermatology yearly.   Colon polyps     Previous polyps on colonoscopy.  [facility].     Hearing aid worn    both ears   Hiatal hernia 10/20/2013   EGD confirmed.  [facility].   History of Barrett's esophagus 2014   pt told by dr Watt Climes no barrett's esophagus 03-03-2021   History of hypertension    no meds since 2019   History of prediabetes    non pre dm since june 2021 weight loss   Hyperlipemia    Neuromuscular disorder (HCC)    numbness rt hand   Seasonal allergies    Sleep apnea    uses CPAP nightly   Past Surgical History:  Procedure Laterality Date   BLADDER STONE REMOVAL  2011, 2018   CARPAL TUNNEL  RELEASE  01/17/2012   Procedure: CARPAL TUNNEL RELEASE;  Surgeon: Cammie Sickle., MD;  Location: [place];  Service: Orthopedics;  Laterality: Right;   CERVICAL FUSION  1976   COLONOSCOPY  10/20/2013   [facility].  Polyps.    CYSTOSCOPY WITH LITHOLAPAXY N/A 01/09/2017   Procedure: CYSTOSCOPY WITH LITHOLAPAXY;  Surgeon: Franchot Gallo, MD;  Location[facility];  Service: Urology;  Laterality: N/A;   ESOPHAGOGASTRODUODENOSCOPY  10/20/2013   [facility]; biopsy of area concernig for Barrett's; gastritis.   EYE SURGERY  2012   both cataracts   FRACTURE SURGERY Right 1960's   3 fingers   HOLMIUM LASER APPLICATION N/A 05/13/8241   Procedure: HOLMIUM LASER APPLICATION;  Surgeon: Franchot Gallo, MD;  Location[facility];  Service: Urology;  Laterality: N/A;   QUADRICEPS TENDON REPAIR  2003   bilateral   TRANSURETHRAL RESECTION OF PROSTATE N/A 03/08/2021   Procedure: TRANSURETHRAL RESECTION OF THE PROSTATE (TURP);  Surgeon: Franchot Gallo, MD;  Location[facility];  Service: Urology;  Laterality: N/A;   ULNAR NERVE TRANSPOSITION  01/17/2012   Procedure: ULNAR NERVE DECOMPRESSION/TRANSPOSITION;  Surgeon: Cammie Sickle., MD;  Location: [place];  Service: Orthopedics;  Laterality: Right;  decompression of ulnar nerve only  at right cubital tunnel   ULNAR NERVE TRANSPOSITION Left 06/01/2017   Procedure: DECOMPRESSION ULNAR NERVE LEFT CUBITAL TUNNEL;  Surgeon: Leanora Cover, MD;  Location: [place];  Service: Orthopedics;  Laterality: Left;   UPPER GASTROINTESTINAL ENDOSCOPY     VASECTOMY  yrs ago   Allergies  Allergen Reactions   Shellfish Allergy     Headaches, high blood pressure   Ace Inhibitors Cough   Amoxicillin Nausea And Vomiting   Darvon     Chest pain   Toprol Xl [Metoprolol Tartrate]     Not sure reaction   Prior to Admission medications   Medication Sig Start Date End Date Taking? Authorizing Provider  acetaminophen (TYLENOL) 500 MG tablet Take 1,000 mg by mouth every 6 (six) hours as needed.   Yes [provider]  Aspirin-Caffeine (BC FAST PAIN RELIEF ARTHRITIS PO) Take by mouth.   Yes [provider]  Digestive Enzymes (PAPAYA AND ENZYMES PO) Take by mouth.   Yes [provider]  fish oil-omega-3 fatty acids 1000 MG capsule daily. 2000 mg BID   Yes [provider]  GARLIC PO Take 353 mg by mouth 2 (two) times daily. 2 tabs bid per pt   Yes [provider]  Multiple Vitamin (MULTIVITAMIN) tablet Take 1 tablet by mouth daily.   Yes [provider]  pravastatin (PRAVACHOL) 20 MG tablet TAKE 1 TABLET BY MOUTH EVERY DAY CAN START FEW DAYS PER WEEK AND INCREASE IF TOLERATED 10/12/21  Yes Wendie Agreste, MD  psyllium (METAMUCIL) 58.6 % powder Take 1 packet by mouth daily.   Yes [provider]   Social History   Socioeconomic History   Marital status: Married    Spouse name: Not on file   Number of children: 2   Years of education: 12   Highest education level: Not on file  Occupational History   Occupation: Retired  Tobacco Use   Smoking status: Former    Packs/day: 2.00    Years: 15.00    Pack years: 30.00    Types:  Cigarettes    Quit date: 01/11/1974    Years since quitting: 47.8   Smokeless tobacco: Never  Vaping Use   Vaping Use: Never used  Substance and Sexual Activity   Alcohol use: Yes    Alcohol/week: 1.0 standard drink    Types: 1 Standard drinks or equivalent per week    Comment: occ   Drug use: No   Sexual activity: Not Currently  Other Topics Concern   Not on file  Social History Narrative   Marital status:  Married x 42 years      Children: 2 daughters; 3 grandchildren.      Employment; retired in 1998 from [employer]; retired age 51 from [employer] after 14 years.      Lives: with wife.      Tobacco: former smoker; quit at age 75.  Smoked 15 years.      Alcohol:  On vacation or special occasions.        Education: [employer].       Exercise: Gym 3-4 times a week for 2 hours.; exercises 7 days per week.   Elliptical for one hour.      Advanced Directives: none; +desires FULL CODE.        ADLs: independent with ADLs; drives.  Does not walk with assistant devices.     Drinks 3-4 caffeine drinks a day    Social Determinants of Radio broadcast assistant Strain: Low Risk    Difficulty of Paying Living Expenses: Not hard at all  Food Insecurity: No Food Insecurity   Worried About Charity fundraiser in the Last Year: Never true   Arboriculturist in the Last Year: Never true  Transportation Needs: No Transportation Needs   Lack of Transportation (Medical): No   Lack of Transportation (Non-Medical): No  Physical Activity: Sufficiently Active   Days of Exercise per Week: 4 days   Minutes of Exercise per Session: 40 min  Stress: No Stress Concern Present   Feeling of Stress : Not at all  Social Connections: Socially Integrated   Frequency of Communication with Friends and Family: Twice a week   Frequency of Social Gatherings with Friends and Family: Twice a week   Attends Religious Services: More than 4 times per year   Active Member of [employer] or  Organizations: Yes   Attends Archivist Meetings: 1 to 4 times per year   Marital Status: Married  Human resources officer Violence: Not At Risk   Fear of Current or Ex-Partner: No   Emotionally Abused: No   Physically Abused: No   Sexually Abused: No    Review of Systems 13 point review of systems per patient health survey noted.  Negative other than as indicated above or in HPI.    Objective:   Vitals:   10/18/21 0748  BP: 136/78  Pulse: 72  Resp: 16  Temp: 98.2 F (36.8 C)  TempSrc: Temporal  SpO2: 96%  Weight: 205 lb 9.6 oz (93.3 kg)  Height: 5\' 11"  (1.803 m)     Physical Exam Vitals reviewed.  Constitutional:      Appearance: He is well-developed.  HENT:     Head: Normocephalic and atraumatic.     Right Ear: External ear normal.     Left Ear: External ear normal.  Eyes:     Conjunctiva/sclera: Conjunctivae normal.     Pupils: Pupils are equal, round, and reactive to light.  Neck:     Thyroid: No thyromegaly.  Cardiovascular:     Rate and Rhythm: Normal rate and regular rhythm.  Heart sounds: Normal heart sounds.  Pulmonary:     Effort: Pulmonary effort is normal. No respiratory distress.     Breath sounds: Normal breath sounds. No wheezing.  Abdominal:     General: There is no distension.     Palpations: Abdomen is soft.     Tenderness: There is no abdominal tenderness.  Musculoskeletal:        General: No swelling or tenderness. Normal range of motion.     Cervical back: Normal range of motion and neck supple.     Right lower leg: No edema.     Left lower leg: No edema.     Comments: Bilateral feet nontender including plantar fascia.  Pain-free range of motion of ankle and with flexion/extension of toes.   Pain-free range of motion of knees bilaterally, negative seated straight leg raise bilaterally.  Lymphadenopathy:     Cervical: No cervical adenopathy.  Skin:    General: Skin is warm and dry.     Comments: Foot exam, no cyanosis.  Skin  intact.  No wounds.  Cap refill less than 1 second.  DP pulse 2+ bilaterally.  Neurological:     Mental Status: He is alert and oriented to person, place, and time.     Deep Tendon Reflexes: Reflexes are normal and symmetric.  Psychiatric:        Behavior: Behavior normal.       Assessment & Plan:  KALOB BERGEN is a 80 y.o. male . Medicare annual wellness visit, subsequent - Plan: CBC with Differential/Platelet, Lipid panel, Hemoglobin A1c, Comprehensive metabolic panel  - - anticipatory guidance as below in AVS, screening labs if needed. Health maintenance items as above in HPI discussed/recommended as applicable.  - no concerning responses on depression, fall, or functional status screening. Any positive responses noted as above. Advanced directives discussed as in CH[facility].   Prediabetes - Plan: Hemoglobin A1c, Comprehensive metabolic panel  -Repeat E7O as weight up a few pounds.  Continue exercise, watching diet.  Mixed hyperlipidemia - Plan: Lipid panel, Comprehensive metabolic panel  -Check labs, tolerating statin, continue same  Screening for deficiency anemia - Plan: CBC with Differential/Platelet Numbness and tingling of foot - Plan: B12, TSH Foot cramps - Plan: B12, TSH  -Continue range of motion/stretching for foot cramps.  Reassuring exam.  Appears to have adequate circulation throughout foot with normal cap refill and DP pulse.  Will check TSH, B12, other electrolytes as above to evaluate for other neuropathy: Causing muscle cramps.  Continue stretches, continue compression stockings if those are helpful.  Recheck 6 weeks.  Consider neuro eval or nerve conduction study if persistent.  Denies significant discomfort or changes in symptoms recently, hold on meds for now.  Could consider low-dose gabapentin if needed.  No current knee pain.   likely arthritic changes but without discomfort or limitations would hold on new treatment or referral at this time.  Option of Ortho eval if  symptomatic.  No orders of the defined types were placed in this encounter.  Patient Instructions  If any pain in the knees or limitations in activity, I am happy to refer you to ortho to look into injection options.   I will check some labs for the foot tingling, but that may be from multiple causes. Please follow up to recheck feet in next 6 weeks. Return to the clinic or go to the nearest emergency room if any of your symptoms worsen or new symptoms occur.  Thanks for coming in  today and take care.   Preventive Care 7 Years and Older, Male Preventive care refers to lifestyle choices and visits with your health care provider that can promote health and wellness. Preventive care visits are also called wellness exams. What can I expect for my preventive care visit? Counseling During your preventive care visit, your health care provider may ask about your: Medical history, including: Past medical problems. Family medical history. History of falls. Current health, including: Emotional well-being. Home life and relationship well-being. Sexual activity. Memory and ability to understand (cognition). Lifestyle, including: Alcohol, nicotine or tobacco, and drug use. Access to firearms. Diet, exercise, and sleep habits. Work and work Statistician. Sunscreen use. Safety issues such as seatbelt and bike helmet use. Physical exam Your health care provider will check your: Height and weight. These may be used to calculate your BMI (body mass index). BMI is a measurement that tells if you are at a healthy weight. Waist circumference. This measures the distance around your waistline. This measurement also tells if you are at a healthy weight and may help predict your risk of certain diseases, such as type 2 diabetes and high blood pressure. Heart rate and blood pressure. Body temperature. Skin for abnormal spots. What immunizations do I need? Vaccines are usually given at various ages,  according to a schedule. Your health care provider will recommend vaccines for you based on your age, medical history, and lifestyle or other factors, such as travel or where you work. What tests do I need? Screening Your health care provider may recommend screening tests for certain conditions. This may include: Lipid and cholesterol levels. Diabetes screening. This is done by checking your blood sugar (glucose) after you have not eaten for a while (fasting). Hepatitis C test. Hepatitis B test. HIV (human immunodeficiency virus) test. STI (sexually transmitted infection) testing, if you are at risk. Lung cancer screening. Colorectal cancer screening. Prostate cancer screening. Abdominal aortic aneurysm (AAA) screening. You may need this if you are a current or former smoker. Talk with your health care provider about your test results, treatment options, and if necessary, the need for more tests. Follow these instructions at home: Eating and drinking  Eat a diet that includes fresh fruits and vegetables, whole grains, lean protein, and low-fat dairy products. Limit your intake of foods with high amounts of sugar, saturated fats, and salt. Take vitamin and mineral supplements as recommended by your health care provider. Do not drink alcohol if your health care provider tells you not to drink. If you drink alcohol: Limit how much you have to 0-2 drinks a day. Know how much alcohol is in your drink. In the [place] one drink equals one 12 oz bottle of beer (355 mL), one 5 oz glass of wine (148 mL), or one 1 oz glass of hard liquor (44 mL). Lifestyle Brush your teeth every morning and night with fluoride toothpaste. Floss one time each day. Exercise for at least 30 minutes 5 or more days each week. Do not use any products that contain nicotine or tobacco. These products include cigarettes, chewing tobacco, and vaping devices, such as e-cigarettes. If you need help quitting, ask your health care  provider. Do not use drugs. If you are sexually active, practice safe sex. Use a condom or other form of protection to prevent STIs. Take aspirin only as told by your health care provider. Make sure that you understand how much to take and what form to take. Work with your health care provider to  find out whether it is safe and beneficial for you to take aspirin daily. Ask your health care provider if you need to take a cholesterol-lowering medicine (statin). Find healthy ways to manage stress, such as: Meditation, yoga, or listening to music. Journaling. Talking to a trusted person. Spending time with friends and family. Safety Always wear your seat belt while driving or riding in a vehicle. Do not drive: If you have been drinking alcohol. Do not ride with someone who has been drinking. When you are tired or distracted. While texting. If you have been using any mind-altering substances or drugs. Wear a helmet and other protective equipment during sports activities. If you have firearms in your house, make sure you follow all gun safety procedures. Minimize exposure to UV radiation to reduce your risk of skin cancer. What's next? Visit your health care provider once a year for an annual wellness visit. Ask your health care provider how often you should have your eyes and teeth checked. Stay up to date on all vaccines. This information is not intended to replace advice given to you by your health care provider. Make sure you discuss any questions you have with your health care provider. Document Revised: 04/21/2021 Document Reviewed: 04/21/2021 Elsevier Patient Education  2022 [place],   Merri Ray, MD [place] 10/18/21 8:39 AM

## 2021-10-18 NOTE — Patient Instructions (Addendum)
If any pain in the knees or limitations in activity, I am happy to refer you to ortho to look into injection options.   I will check some labs for the foot tingling, but that may be from multiple causes. Please follow up to recheck feet in next 6 weeks. Return to the clinic or go to the nearest emergency room if any of your symptoms worsen or new symptoms occur.  Thanks for coming in today and take care.   Preventive Care 64 Years and Older, Male Preventive care refers to lifestyle choices and visits with your health care provider that can promote health and wellness. Preventive care visits are also called wellness exams. What can I expect for my preventive care visit? Counseling During your preventive care visit, your health care provider may ask about your: Medical history, including: Past medical problems. Family medical history. History of falls. Current health, including: Emotional well-being. Home life and relationship well-being. Sexual activity. Memory and ability to understand (cognition). Lifestyle, including: Alcohol, nicotine or tobacco, and drug use. Access to firearms. Diet, exercise, and sleep habits. Work and work Statistician. Sunscreen use. Safety issues such as seatbelt and bike helmet use. Physical exam Your health care provider will check your: Height and weight. These may be used to calculate your BMI (body mass index). BMI is a measurement that tells if you are at a healthy weight. Waist circumference. This measures the distance around your waistline. This measurement also tells if you are at a healthy weight and may help predict your risk of certain diseases, such as type 2 diabetes and high blood pressure. Heart rate and blood pressure. Body temperature. Skin for abnormal spots. What immunizations do I need? Vaccines are usually given at various ages, according to a schedule. Your health care provider will recommend vaccines for you based on your age,  medical history, and lifestyle or other factors, such as travel or where you work. What tests do I need? Screening Your health care provider may recommend screening tests for certain conditions. This may include: Lipid and cholesterol levels. Diabetes screening. This is done by checking your blood sugar (glucose) after you have not eaten for a while (fasting). Hepatitis C test. Hepatitis B test. HIV (human immunodeficiency virus) test. STI (sexually transmitted infection) testing, if you are at risk. Lung cancer screening. Colorectal cancer screening. Prostate cancer screening. Abdominal aortic aneurysm (AAA) screening. You may need this if you are a current or former smoker. Talk with your health care provider about your test results, treatment options, and if necessary, the need for more tests. Follow these instructions at home: Eating and drinking  Eat a diet that includes fresh fruits and vegetables, whole grains, lean protein, and low-fat dairy products. Limit your intake of foods with high amounts of sugar, saturated fats, and salt. Take vitamin and mineral supplements as recommended by your health care provider. Do not drink alcohol if your health care provider tells you not to drink. If you drink alcohol: Limit how much you have to 0-2 drinks a day. Know how much alcohol is in your drink. In the [place] one drink equals one 12 oz bottle of beer (355 mL), one 5 oz glass of wine (148 mL), or one 1 oz glass of hard liquor (44 mL). Lifestyle Brush your teeth every morning and night with fluoride toothpaste. Floss one time each day. Exercise for at least 30 minutes 5 or more days each week. Do not use any products that contain nicotine or tobacco.  These products include cigarettes, chewing tobacco, and vaping devices, such as e-cigarettes. If you need help quitting, ask your health care provider. Do not use drugs. If you are sexually active, practice safe sex. Use a condom or other  form of protection to prevent STIs. Take aspirin only as told by your health care provider. Make sure that you understand how much to take and what form to take. Work with your health care provider to find out whether it is safe and beneficial for you to take aspirin daily. Ask your health care provider if you need to take a cholesterol-lowering medicine (statin). Find healthy ways to manage stress, such as: Meditation, yoga, or listening to music. Journaling. Talking to a trusted person. Spending time with friends and family. Safety Always wear your seat belt while driving or riding in a vehicle. Do not drive: If you have been drinking alcohol. Do not ride with someone who has been drinking. When you are tired or distracted. While texting. If you have been using any mind-altering substances or drugs. Wear a helmet and other protective equipment during sports activities. If you have firearms in your house, make sure you follow all gun safety procedures. Minimize exposure to UV radiation to reduce your risk of skin cancer. What's next? Visit your health care provider once a year for an annual wellness visit. Ask your health care provider how often you should have your eyes and teeth checked. Stay up to date on all vaccines. This information is not intended to replace advice given to you by your health care provider. Make sure you discuss any questions you have with your health care provider. Document Revised: 04/21/2021 Document Reviewed: 04/21/2021 Elsevier Patient Education  [place].

## 2021-11-15 ENCOUNTER — Ambulatory Visit (INDEPENDENT_AMBULATORY_CARE_PROVIDER_SITE_OTHER): Payer: Medicare Other | Admitting: Adult Health

## 2021-11-15 ENCOUNTER — Encounter: Payer: Self-pay | Admitting: Adult Health

## 2021-11-15 VITALS — BP 134/80 | HR 78 | Ht 71.0 in | Wt 208.6 lb

## 2021-11-15 DIAGNOSIS — G4733 Obstructive sleep apnea (adult) (pediatric): Secondary | ICD-10-CM

## 2021-11-15 DIAGNOSIS — Z9989 Dependence on other enabling machines and devices: Secondary | ICD-10-CM | POA: Diagnosis not present

## 2021-11-15 NOTE — Patient Instructions (Signed)
Continue using CPAP nightly and greater than 4 hours each night I will adjust pressure If your symptoms worsen or you develop new symptoms please let us know.

## 2021-11-15 NOTE — Progress Notes (Signed)
PATIENT: Todd [name] DOB: 1941/06/11  REASON FOR VISIT: follow up HISTORY FROM: patient PRIMARY NEUROLOGIST: Dr. Rexene Alberts  HISTORY OF PRESENT ILLNESS: Today 11/15/21:  Todd [name] is a 81 year old male with a history of obstructive sleep apnea on CPAP.  He returns today for follow-up.  He reports that the CPAP is working well for him.  He denies any new symptoms.  His download is below.  His residual AHI is still elevated.  In the past he has tried higher pressures but he felt that the air was making his stomach feel bloated.  Not sure that he is ever been on AutoPap.     05/04/21: Todd [name] is a 81 year old male with a history of obstructive sleep apnea on CPAP.  He returns today for follow-up.  At his last visit his pressure was set at 8 cmH2O increase to 10 cm of water his residual AHI has decreased from 31 events to 18 events.  The patient states that he is tolerating the pressure well.  He reports that he continues to get up several times a night to urinate his download is below.    HISTORY (copied from Dr. Guadelupe Sabin note) /12/2020: I reviewed his CPAP compliance data from 12/06/2020 through 01/04/2021, which is a total of 30 days, during which time he used his machine every night with percent use days greater than 4 hours at 100%, indicating superb compliance with average usage of 7 hours and 39 minutes, residual AHI highly elevated at 31.9/h, residual events appear to be obstructive in nature, pressure at 8 cm, leak acceptable with a 95th percentile at 20.9 L/min. He reports doing well, he tolerates the pressure so much better than the high pressure BiPAP, he eventually gave up using the BiPAP.  He feels like he is sleeping quite well.  He continues to use a full facemask and sometimes with position change he does notice the leak.  He is motivated to continue with treatment.  He was not aware that his AHI score was high.  He does report that when he stopped using his BiPAP and slept without  treatment, he slept fairly well, his wife noticed less snoring overall compared to the past.  He had a recent checkup with his primary care, he is doing well.  He tries to stay active, does some resistance training and aerobic exercises, uses a pedaling device at home.  REVIEW OF SYSTEMS: Out of a complete 14 system review of symptoms, the patient complains only of the following symptoms, and all other reviewed systems are negative.  ESS 5  ALLERGIES: Allergies  Allergen Reactions   Shellfish Allergy     Headaches, high blood pressure   Ace Inhibitors Cough   Amoxicillin Nausea And Vomiting   Darvon     Chest pain   Toprol Xl [Metoprolol Tartrate]     Not sure reaction    HOME MEDICATIONS: Outpatient Medications Prior to Visit  Medication Sig Dispense Refill   acetaminophen (TYLENOL) 500 MG tablet Take 1,000 mg by mouth every 6 (six) hours as needed.     Aspirin-Caffeine (BC FAST PAIN RELIEF ARTHRITIS PO) Take by mouth.     Digestive Enzymes (PAPAYA AND ENZYMES PO) Take by mouth.     fish oil-omega-3 fatty acids 1000 MG capsule daily. 4782 mg BID     GARLIC PO Take 956 mg by mouth 2 (two) times daily. 2 tabs bid per pt     Multiple Vitamin (MULTIVITAMIN) tablet Take 1 tablet  by mouth daily.     pravastatin (PRAVACHOL) 20 MG tablet TAKE 1 TABLET BY MOUTH EVERY DAY CAN START FEW DAYS PER WEEK AND INCREASE IF TOLERATED 90 tablet 1   psyllium (METAMUCIL) 58.6 % powder Take 1 packet by mouth daily.     No facility-administered medications prior to visit.    PAST MEDICAL HISTORY: Past Medical History:  Diagnosis Date   Arthritis    hands   Bladder stone    BPH (benign prostatic hyperplasia)    Cancer ([place])    skin cancer multiple; followed by [facility]/dermatology yearly.   Colon polyps     Previous polyps on colonoscopy.  [facility].     Hearing aid worn    both ears   Hiatal hernia 10/20/2013   EGD confirmed.  [facility].   History of Barrett's esophagus 2014   pt told by dr Watt [facility] no  barrett's esophagus 03-03-2021   History of hypertension    no meds since 2019   History of prediabetes    non pre dm since june 2021 weight loss   Hyperlipemia    Neuromuscular disorder (HCC)    numbness rt hand   Seasonal allergies    Sleep apnea    uses CPAP nightly    PAST SURGICAL HISTORY: Past Surgical History:  Procedure Laterality Date   BLADDER STONE REMOVAL  2011, 2018   CARPAL TUNNEL RELEASE  01/17/2012   Procedure: CARPAL TUNNEL RELEASE;  Surgeon: Cammie Sickle., MD;  Location: [facility];  Service: Orthopedics;  Laterality: Right;   CERVICAL FUSION  1976   COLONOSCOPY  10/20/2013   [facility].  Polyps.    CYSTOSCOPY WITH LITHOLAPAXY N/A 01/09/2017   Procedure: CYSTOSCOPY WITH LITHOLAPAXY;  Surgeon: Franchot Gallo, MD;  Location: [facility];  Service: Urology;  Laterality: N/A;   ESOPHAGOGASTRODUODENOSCOPY  10/20/2013   [facility]; biopsy of area concernig for Barrett's; gastritis.   EYE SURGERY  2012   both cataracts   FRACTURE SURGERY Right 1960's   3 fingers   HOLMIUM LASER APPLICATION N/A 0/12/4095   Procedure: HOLMIUM LASER APPLICATION;  Surgeon: Franchot Gallo, MD;  [facility];  Service: Urology;  Laterality: N/A;   QUADRICEPS TENDON REPAIR  2003   bilateral   TRANSURETHRAL RESECTION OF PROSTATE N/A 03/08/2021   Procedure: TRANSURETHRAL RESECTION OF THE PROSTATE (TURP);  Surgeon: Franchot Gallo, MD;  Location[facility];  Service: Urology;  Laterality: N/A;   ULNAR NERVE TRANSPOSITION  01/17/2012   Procedure: ULNAR NERVE DECOMPRESSION/TRANSPOSITION;  Surgeon: Cammie Sickle., MD;  Location: [facility];  Service: Orthopedics;  Laterality: Right;  decompression of ulnar nerve only  at right cubital tunnel   ULNAR NERVE TRANSPOSITION Left 06/01/2017   Procedure: DECOMPRESSION ULNAR NERVE LEFT CUBITAL TUNNEL;  Surgeon: Leanora Cover, MD;  Location: [facility];   Service: Orthopedics;  Laterality: Left;   UPPER GASTROINTESTINAL ENDOSCOPY     VASECTOMY  yrs ago    FAMILY HISTORY: Family History  Problem Relation Age of Onset   Heart failure Mother        in 59s   Cirrhosis Father    Hypertension Father    Alcohol abuse Father    Sleep apnea Father    Arthritis Brother        Rheumatoid arthritis   Hypertension Brother     SOCIAL HISTORY: Social History   Socioeconomic History   Marital status: Married    Spouse  name: Not on file   Number of children: 2   Years of education: 40   Highest education level: Not on file  Occupational History   Occupation: Retired  Tobacco Use   Smoking status: Former    Packs/day: 2.00    Years: 15.00    Pack years: 30.00    Types: Cigarettes    Quit date: 01/11/1974    Years since quitting: 47.8   Smokeless tobacco: Never  Vaping Use   Vaping Use: Never used  Substance and Sexual Activity   Alcohol use: Yes    Alcohol/week: 1.0 standard drink    Types: 1 Standard drinks or equivalent per week    Comment: occ   Drug use: No   Sexual activity: Not Currently  Other Topics Concern   Not on file  Social History Narrative   Marital status:  Married x 42 years      Children: 2 daughters; 3 grandchildren.      Employment; retired in 1998 from [employer]; retired age 43 from [employer] after 14 years.      Lives: with wife.      Tobacco: former smoker; quit at age 65.  Smoked 15 years.      Alcohol:  On vacation or special occasions.        Education: [employer].       Exercise: Gym 3-4 times a week for 2 hours.; exercises 7 days per week.   Elliptical for one hour.      Advanced Directives: none; +desires FULL CODE.        ADLs: independent with ADLs; drives.  Does not walk with assistant devices.     Drinks 3-4 caffeine drinks a day    Social Determinants of Radio broadcast assistant Strain: Low Risk    Difficulty of Paying Living Expenses: Not hard at all  Food  Insecurity: No Food Insecurity   Worried About Charity fundraiser in the Last Year: Never true   Arboriculturist in the Last Year: Never true  Transportation Needs: No Transportation Needs   Lack of Transportation (Medical): No   Lack of Transportation (Non-Medical): No  Physical Activity: Sufficiently Active   Days of Exercise per Week: 4 days   Minutes of Exercise per Session: 40 min  Stress: No Stress Concern Present   Feeling of Stress : Not at all  Social Connections: Socially Integrated   Frequency of Communication with Friends and Family: Twice a week   Frequency of Social Gatherings with Friends and Family: Twice a week   Attends Religious Services: More than 4 times per year   Active Member of [employer] or Organizations: Yes   Attends Archivist Meetings: 1 to 4 times per year   Marital Status: Married  Human resources officer Violence: Not At Risk   Fear of Current or Ex-Partner: No   Emotionally Abused: No   Physically Abused: No   Sexually Abused: No      PHYSICAL EXAM  Vitals:   11/15/21 0902  BP: 134/80  Pulse: 78  Weight: 208 lb 9.6 oz (94.6 kg)  Height: 5\' 11"  (1.803 m)   Body mass index is 29.09 kg/m.  Generalized: Well developed, in no acute distress  Chest: Lungs clear to auscultation bilaterally  Neurological examination  Mentation: Alert orien yeah and this is and is all obstructive and do not really have a leak at all with the mask.  history taking. Follows all  commands speech and language fluent Cranial nerve II-XII: Extraocular movements were full, visual field were full on confrontational test Head turning and shoulder shrug  were normal and symmetric. Motor: The motor testing reveals 5 over 5 strength of all 4 extremities. Good symmetric motor tone is noted throughout.  Sensory: Sensory testing is intact to soft touch on all 4 extremities. No evidence of extinction is noted.  Gait and station: Gait is normal.     DIAGNOSTIC DATA (LABS,  IMAGING, TESTING) - I reviewed patient records, labs, notes, testing and imaging myself where available.  Lab Results  Component Value Date   WBC 4.3 10/18/2021   HGB 14.8 10/18/2021   HCT 44.2 10/18/2021   MCV 87.2 10/18/2021   PLT 196.0 10/18/2021      Component Value Date/Time   NA 138 10/18/2021 0845   NA 137 10/26/2020 1141   K 4.0 10/18/2021 0845   CL 101 10/18/2021 0845   CO2 28 10/18/2021 0845   GLUCOSE 89 10/18/2021 0845   BUN 16 10/18/2021 0845   BUN 15 10/26/2020 1141   CREATININE 0.94 10/18/2021 0845   CREATININE 0.89 08/23/2016 1016   CALCIUM 9.4 10/18/2021 0845   PROT 7.2 10/18/2021 0845   PROT 7.2 10/26/2020 1141   ALBUMIN 4.7 10/18/2021 0845   ALBUMIN 4.8 (H) 10/26/2020 1141   AST 24 10/18/2021 0845   ALT 18 10/18/2021 0845   ALKPHOS 89 10/18/2021 0845   BILITOT 1.1 10/18/2021 0845   BILITOT 0.8 10/26/2020 1141   GFRNONAA 79 10/26/2020 1141   GFRAA 91 10/26/2020 1141   Lab Results  Component Value Date   CHOL 154 10/18/2021   HDL 51.50 10/18/2021   LDLCALC 78 10/18/2021   TRIG 126.0 10/18/2021   CHOLHDL 3 10/18/2021   Lab Results  Component Value Date   HGBA1C 5.8 10/18/2021   Lab Results  Component Value Date   VITAMINB12 377 10/18/2021   Lab Results  Component Value Date   TSH 1.50 10/18/2021      ASSESSMENT AND PLAN 81 y.o. year old male  has a past medical history of Arthritis, Bladder stone, BPH (benign prostatic hyperplasia), Cancer ([place]), Colon polyps, Hearing aid worn, Hiatal hernia (10/20/2013), History of Barrett's esophagus (2014), History of hypertension, History of prediabetes, Hyperlipemia, Neuromuscular disorder ([place]), Seasonal allergies, and Sleep apnea. here with:  1.  Obstructive sleep apnea on CPAP  --Good compliance --Residual AHI is elevated at 16 events an hour --We will change him to AutoSet 5 to 15 cm water, if his machine cannot be changed to AutoSet , pressure will be increased to 13 cm of water --Patient  will call in 1 month and we will pull another download to see if his events have further decrease. --Follow-up 1 year or sooner if needed     Ward Givens, MSN, NP-C 11/15/2021, 9:13 AM [facility] [address], [place], [place] [address] [phone]

## 2021-11-29 ENCOUNTER — Encounter: Payer: Self-pay | Admitting: Family Medicine

## 2021-11-29 ENCOUNTER — Ambulatory Visit (INDEPENDENT_AMBULATORY_CARE_PROVIDER_SITE_OTHER): Payer: Medicare Other | Admitting: Family Medicine

## 2021-11-29 VITALS — BP 118/66 | HR 80 | Temp 98.3°F | Ht 71.0 in | Wt 208.4 lb

## 2021-11-29 DIAGNOSIS — R202 Paresthesia of skin: Secondary | ICD-10-CM | POA: Diagnosis not present

## 2021-11-29 DIAGNOSIS — R252 Cramp and spasm: Secondary | ICD-10-CM

## 2021-11-29 DIAGNOSIS — R200 Anesthesia of skin: Secondary | ICD-10-CM

## 2021-11-29 NOTE — Progress Notes (Signed)
Subjective:  Patient ID: Todd [name], male    DOB: 1941-06-15  Age: 81 y.o. MRN: [id number]  CC:  Chief Complaint  Patient presents with   Follow-up    HPI Todd [name] presents for   Foot dysesthesias: Discussed last visit, 4-5 yrs. No back or leg pain, no color changes in toes.  Feels like feet waking up form sleep. Better in morning, notices with walking.not limiting activity. No pain. Sensation in feet/ankle bones - aware of bone - no pain. Chronic 5th finger numbness for carpal tunnel.  Stretches for feet daily - no more cramps.  No weakness.  No bowel or bladder incontinence, no saddle anesthesia, no lower extremity weakness.  No recent changes.  Out of town 2/16-3/4.  Lab Results  Component Value Date   HGBA1C 5.8 10/18/2021   Lab Results  Component Value Date   JYNWGNFA21 308 10/18/2021   Lab Results  Component Value Date   WBC 4.3 10/18/2021   HGB 14.8 10/18/2021   HCT 44.2 10/18/2021   MCV 87.2 10/18/2021   PLT 196.0 10/18/2021   Lab Results  Component Value Date   NA 138 10/18/2021   K 4.0 10/18/2021   CL 101 10/18/2021   CO2 28 10/18/2021         History Patient Active Problem List   Diagnosis Date Noted   Enlarged prostate with urinary obstruction 03/08/2021   Prediabetes 03/07/2019   Bladder stone 03/20/2017   Nephrolithiasis 03/20/2017   Sensorineural hearing loss (SNHL) of both ears 03/20/2017   OSA on CPAP 02/21/2017   Primary osteoarthritis of both hands 03/01/2016   Mixed hyperlipidemia 03/01/2016   Seasonal allergic rhinitis due to pollen 03/01/2016   BMI 31.0-31.9,adult 03/01/2016   Obesity 03/01/2016   Angioedema 02/16/2013   Past Medical History:  Diagnosis Date   Arthritis    hands   Bladder stone    BPH (benign prostatic hyperplasia)    Cancer ([place])    skin cancer multiple; followed by [facility]/dermatology yearly.   Colon polyps     Previous polyps on colonoscopy.  [facility].     Hearing aid worn    both ears   Hiatal  hernia 10/20/2013   EGD confirmed.  [facility].   History of Barrett's esophagus 2014   pt told by dr Watt Climes no barrett's esophagus 03-03-2021   History of hypertension    no meds since 2019   History of prediabetes    non pre dm since june 2021 weight loss   Hyperlipemia    Neuromuscular disorder ([facility])    numbness rt hand   Seasonal allergies    Sleep apnea    uses CPAP nightly   Past Surgical History:  Procedure Laterality Date   BLADDER STONE REMOVAL  2011, 2018   CARPAL TUNNEL RELEASE  01/17/2012   Procedure: CARPAL TUNNEL RELEASE;  Surgeon: Cammie Sickle., MD;  Location: [place];  Service: Orthopedics;  Laterality: Right;   CERVICAL FUSION  1976   COLONOSCOPY  10/20/2013   [facility].  Polyps.    CYSTOSCOPY WITH LITHOLAPAXY N/A 01/09/2017   Procedure: CYSTOSCOPY WITH LITHOLAPAXY;  Surgeon: Franchot Gallo, MD;  Location: [facility];  Service: Urology;  Laterality: N/A;   ESOPHAGOGASTRODUODENOSCOPY  10/20/2013   [facility]; biopsy of area concernig for Barrett's; gastritis.   EYE SURGERY  2012   both cataracts   FRACTURE SURGERY Right 1960's   3 fingers   HOLMIUM LASER APPLICATION N/A 04/11/7845  Procedure: HOLMIUM LASER APPLICATION;  Surgeon: Franchot Gallo, MD;  Location: [facility];  Service: Urology;  Laterality: N/A;   QUADRICEPS TENDON REPAIR  2003   bilateral   TRANSURETHRAL RESECTION OF PROSTATE N/A 03/08/2021   Procedure: TRANSURETHRAL RESECTION OF THE PROSTATE (TURP);  Surgeon: Franchot Gallo, MD;  Location: [facility];  Service: Urology;  Laterality: N/A;   ULNAR NERVE TRANSPOSITION  01/17/2012   Procedure: ULNAR NERVE DECOMPRESSION/TRANSPOSITION;  Surgeon: Cammie Sickle., MD;  Location: [place];  Service: Orthopedics;  Laterality: Right;  decompression of ulnar nerve only  at right cubital tunnel   ULNAR NERVE TRANSPOSITION Left 06/01/2017   Procedure: DECOMPRESSION ULNAR NERVE LEFT  CUBITAL TUNNEL;  Surgeon: Leanora Cover, MD;  Location: [place];  Service: Orthopedics;  Laterality: Left;   UPPER GASTROINTESTINAL ENDOSCOPY     VASECTOMY  yrs ago   Allergies  Allergen Reactions   Shellfish Allergy     Headaches, high blood pressure   Ace Inhibitors Cough   Amoxicillin Nausea And Vomiting   Darvon     Chest pain   Toprol Xl [Metoprolol Tartrate]     Not sure reaction   Prior to Admission medications   Medication Sig Start Date End Date Taking? Authorizing Provider  acetaminophen (TYLENOL) 500 MG tablet Take 1,000 mg by mouth every 6 (six) hours as needed.   Yes [provider]  Aspirin-Caffeine (BC FAST PAIN RELIEF ARTHRITIS PO) Take by mouth.   Yes [provider]  Digestive Enzymes (PAPAYA AND ENZYMES PO) Take by mouth.   Yes [provider]  fish oil-omega-3 fatty acids 1000 MG capsule daily. 2000 mg BID   Yes [provider]  GARLIC PO Take 250 mg by mouth 2 (two) times daily. 2 tabs bid per pt   Yes [provider]  Multiple Vitamin (MULTIVITAMIN) tablet Take 1 tablet by mouth daily.   Yes [provider]  pravastatin (PRAVACHOL) 20 MG tablet TAKE 1 TABLET BY MOUTH EVERY DAY CAN START FEW DAYS PER WEEK AND INCREASE IF TOLERATED 10/12/21  Yes Wendie Agreste, MD  psyllium (METAMUCIL) 58.6 % powder Take 1 packet by mouth daily.   Yes [provider]   Social History   Socioeconomic History   Marital status: Married    Spouse name: Not on file   Number of children: 2   Years of education: 12   Highest education level: Not on file  Occupational History   Occupation: Retired  Tobacco Use   Smoking status: Former    Packs/day: 2.00    Years: 15.00    Pack years: 30.00    Types: Cigarettes    Quit date: 01/11/1974    Years since quitting: 47.9   Smokeless tobacco: Never  Vaping Use   Vaping Use: Never used  Substance and Sexual Activity   Alcohol use: Yes    Alcohol/week:  1.0 standard drink    Types: 1 Standard drinks or equivalent per week    Comment: occ   Drug use: No   Sexual activity: Not Currently  Other Topics Concern   Not on file  Social History Narrative   Marital status:  Married x 42 years      Children: 2 daughters; 3 grandchildren.      Employment; retired in 1998 from [employer]; retired age 29 from [employer] after 14 years.      Lives: with wife.  Tobacco: former smoker; quit at age 42.  Smoked 15 years.      Alcohol:  On vacation or special occasions.        Education: [employer].       Exercise: Gym 3-4 times a week for 2 hours.; exercises 7 days per week.   Elliptical for one hour.      Advanced Directives: none; +desires FULL CODE.        ADLs: independent with ADLs; drives.  Does not walk with assistant devices.     Drinks 3-4 caffeine drinks a day    Social Determinants of Radio broadcast assistant Strain: Low Risk    Difficulty of Paying Living Expenses: Not hard at all  Food Insecurity: No Food Insecurity   Worried About Charity fundraiser in the Last Year: Never true   Arboriculturist in the Last Year: Never true  Transportation Needs: No Transportation Needs   Lack of Transportation (Medical): No   Lack of Transportation (Non-Medical): No  Physical Activity: Sufficiently Active   Days of Exercise per Week: 4 days   Minutes of Exercise per Session: 40 min  Stress: No Stress Concern Present   Feeling of Stress : Not at all  Social Connections: Socially Integrated   Frequency of Communication with Friends and Family: Twice a week   Frequency of Social Gatherings with Friends and Family: Twice a week   Attends Religious Services: More than 4 times per year   Active Member of [employer] or Organizations: Yes   Attends Archivist Meetings: 1 to 4 times per year   Marital Status: Married  Human resources officer Violence: Not At Risk   Fear of Current or Ex-Partner: No   Emotionally Abused: No    Physically Abused: No   Sexually Abused: No    Review of Systems As above.   Objective:   Vitals:   11/29/21 1311  BP: 118/66  Pulse: 80  Temp: 98.3 F (36.8 C)  TempSrc: Oral  SpO2: 95%  Weight: 208 lb 6.4 oz (94.5 kg)  Height: 5\' 11"  (1.803 m)    Physical Exam Vitals reviewed.  Constitutional:      General: He is not in acute distress.    Appearance: Normal appearance. He is well-developed.  HENT:     Head: Normocephalic and atraumatic.  Cardiovascular:     Rate and Rhythm: Normal rate.  Pulmonary:     Effort: Pulmonary effort is normal.  Musculoskeletal:     Comments: Bilateral feet with warm skin, no rash, cap refill less than 1 second at toes.  Describes area of dysesthesias entire foot, medial and lateral to ankle.  Intact range of motion and strength at lower extremities bilaterally.  Neurological:     Mental Status: He is alert and oriented to person, place, and time.  Psychiatric:        Mood and Affect: Mood normal.     Assessment & Plan:  Todd [name] is a 81 y.o. male . Numbness and tingling of foot - Plan: Ambulatory referral to Neurology, CANCELED: Ambulatory referral to Neurology  Foot cramps - Plan: Ambulatory referral to Neurology, CANCELED: Ambulatory referral to Neurology  Foot cramps have improved.  Longstanding symptoms of dysesthesias of feet but denies pain, back pain, or color changes.  Unlikely vascular cause.  Refer to neurology to decide on nerve conduction testing versus other testing.  Previous blood work reassuring.  RTC precautions if worsening symptoms.  Option  of gabapentin if more symptomatic, with potential side effects discussed.  No orders of the defined types were placed in this encounter.  Patient Instructions  I will refer you to neurology to discuss foot symptoms.  If any pain, burning or discomfort we can try a low dose nerve medicine. Let me know.  Let me know if there are questions.      Signed,   Merri Ray,  MD [place] 11/29/21 9:25 PM

## 2021-11-29 NOTE — Patient Instructions (Addendum)
I will refer you to neurology to discuss foot symptoms.  If any pain, burning or discomfort we can try a low dose nerve medicine. Let me know.  Let me know if there are questions.

## 2021-12-01 ENCOUNTER — Encounter: Payer: Self-pay | Admitting: Neurology

## 2021-12-01 ENCOUNTER — Ambulatory Visit (INDEPENDENT_AMBULATORY_CARE_PROVIDER_SITE_OTHER): Payer: Medicare Other | Admitting: Neurology

## 2021-12-01 VITALS — BP 122/72 | HR 75 | Ht 71.0 in | Wt 209.2 lb

## 2021-12-01 DIAGNOSIS — M1990 Unspecified osteoarthritis, unspecified site: Secondary | ICD-10-CM

## 2021-12-01 DIAGNOSIS — G4733 Obstructive sleep apnea (adult) (pediatric): Secondary | ICD-10-CM | POA: Diagnosis not present

## 2021-12-01 DIAGNOSIS — R200 Anesthesia of skin: Secondary | ICD-10-CM

## 2021-12-01 DIAGNOSIS — R202 Paresthesia of skin: Secondary | ICD-10-CM

## 2021-12-01 DIAGNOSIS — Z9989 Dependence on other enabling machines and devices: Secondary | ICD-10-CM

## 2021-12-01 NOTE — Progress Notes (Signed)
Subjective:    [name] ID: Todd [name] is a 81 y.o. male.  HPI    Interim history:   Todd [name] is a 81 year old right-handed Todd [name] with an underlying medical history of hypertension, history of arthritis, s/p b/l knee surgeries in 2003 secondary to fall down stairs, with tendon tears b/l, skin [name], cataract with status post repairs, hiatal hernia, hyperlipidemia, degenerative neck d/s with s/p surgery in '76, history of angioedema, and obesity, who presents for Initial evaluation for his lower extremity numbness and tingling.  Todd [name] is referred by his primary care physician, Dr. Carlota [name] for this.  I last saw him on 01/06/2021, at which time Todd [name] was compliant with CPAP.  However, his AHI was highly elevated.  I recommended we increase his CPAP pressure from 8 cm to 10 cm.    Todd [name] saw Todd Givens, NP, in Todd interim on 05/05/2021 at which time Todd [name] was compliant with his CPAP but his residual AHI was elevated.  His CPAP pressure was increased to 12 cm.  Todd [name] saw Todd Browner, NP on 11/15/2021, concern Todd [name] was compliant with his CPAP, AHI was little better but still suboptimal.  Todd [name] was changed to AutoPap of 5 to 15 cm.  Today, 11/27/2021: Todd [name] reports an approximately 5-year history of intermittent paresthesias affecting both feet from Todd ankles down.  Todd [name] has improved sensation in Todd mornings, and as Todd day goes by Todd [name] gets pins-and-needles sensation, no actual pain.  Todd [name] is not really interested in pursuing any symptomatic treatment but would like to get to Todd root cause.  Todd [name] does have quite a bit of arthritis affecting both shoulders and hands.  Todd [name] has numbness in both digits 5 in both hands for over 10 years.  Todd [name] had carpal tunnel surgeries several years ago. Todd [name] has not fallen.  Todd [name] has pursued a healthy lifestyle, hydrates well with water, limits his caffeine, limits his sugar, drinks coffee with Splenda.  Todd [name] drinks alcohol rarely, may have a few drinks on Todd Includes Todd [name] admits but generally Todd [name] may only  drink on special occasions, maybe even just twice a year.  Todd [name] exercises regularly, Todd [name] has a [employer] at home and Todd [name] has a elliptical at home which Todd [name] regularly uses and Todd [name] walks a lot, does guided bird hunts.  Todd [name] is going on a cruise next month to [place] and [place].  Todd [name] had recent labs through his primary care office on 10/18/2021 and I was able to review Todd results: CBC with differential and platelets was unremarkable, lipid panel showed benign findings with a total cholesterol of 154, triglycerides 126, LDL 78, A1c was 5.8, CMP benign with sodium of 138, potassium 4.0, BUN 16, creatinine 0.94, normal AST, normal ALT, normal alk phos.  Vitamin B12 was on Todd lower end of Todd spectrum at 377.  Todd [name] saw Dr. Carlota [name] on 11/29/21 and I reviewed Todd office note.   I reviewed his most recent compliance data today as well with his CPAP machine, Todd pressure was increased to 13 cm a couple of weeks ago when Todd [name] saw [place].  His overall compliance has always been 100%.  Todd [name] has an average usage of nearly 8 hours, 7 hours and 52 minutes, residual AHI is much improved at 9.8/h.  Half of this data is from when Todd [name] was still on pressure of 12 and Todd latest half is on Todd pressure of 13.  We reviewed Todd data in detail together.  Todd [name] is able to tolerate Todd  pressure and motivated to continue with it.  Todd [name]'s allergies, current medications, family history, past medical history, past social history, past surgical history and problem list were reviewed and updated as appropriate.    Previously (copied from previous notes for reference):    I saw him on 03/08/18, at which time Todd [name] was compliant with BiPAP and doing well.    Todd [name] saw Todd Givens, NP on 09/10/18, at which time Todd [name] was doing well with his BiPAP.    Todd [name] saw Todd Givens, NP on 12/24/19, at which time Todd [name] was compliant, residual AHI was around 12/hour. His BiPAP was increased to 19/15 cm.    Todd [name] had a virtual visit with Todd [name] on 07/07/20, at  which time Todd [name] reported difficulty tolerating Todd BiPAP, Todd [name] had not used it in about a week.  Todd [name] also reported significant weight loss.  A home sleep test was ordered for reevaluation.     Todd [name] had an interim home sleep test on 08/05/2020 which indicated mild residual sleep apnea with an AHI of 8.3/h, O2 nadir of 86%.  Todd [name] was encouraged to try AutoPap therapy instead of BiPAP.   Todd [name] was unable to start a new AutoPap machine, as Todd [name] was not eligible yet.  Todd [name] was advised to start a set pressure of 8 cm.     I reviewed his CPAP compliance data from 12/06/2020 through 01/04/2021, which is a total of 30 days, during which time Todd [name] used his machine every night with percent use days greater than 4 hours at 100%, indicating superb compliance with average usage of 7 hours and 39 minutes, residual AHI highly elevated at 31.9/h, residual events appear to be obstructive in nature, pressure at 8 cm, leak acceptable with a 95th percentile at 20.9 L/min.    I saw him on 04/11/2017, at which time Todd [name] was on CPAP of 13 cm with full compliance. Todd [name] had an increased AHI of 21.7 per hour on a pressure of 13 cm. I suggested we increase his pressure to 15 cm at Todd time. Todd [name] was seen in follow-up by Todd [name] on 10/09/2017, at which time Todd [name] was compliant with his CPAP of 15 cm but had significant residual sleep disordered breathing. I suggested Todd [name] return for a full night read titration study. Todd [name] had a CPAP titration study on 12/17/2017. I went over his test results with him in detail today. CPAP was initiated at 6 cm and titrated to 13 cm. His AHI was elevated on 13 cm and Todd [name] was therefore switched to BiPAP therapy at 14/10 and further titrated to 17/13. Todd [name] had a reduced sleep efficiency at 55.3%, sleep latency was 24 minutes and REM latency was delayed at 139 minutes. Todd [name] had an increased percentage of slow-wave sleep and a decreased percentage of REM sleep. On Todd final pressure of 17/13 his AHI was still elevated at 25.7, O2 nadir  was 92%, non-REM sleep was achieved. I recommended a home treatment pressure of 18/14.    I reviewed his BiPAP compliance data from 02/03/2018 through 03/04/2018, which is a total of 30 days, during which time Todd [name] used his BiPAP every night with percent used days greater than 4 hours at 100%, indicating superb compliance with an average usage of 8 hours and 2 minutes, residual AHI improved at 12.5 per hour, leak low with Todd 95th percentile at 1.6 L/m on a pressure of 18/14.  I saw him on 11/28/2016, at which time Todd [name] reported doing well with CPAP therapy,  had adjusted well to treatment and was fully compliant. Due to his residual AHI of 28.7 per hour at Todd time I suggested we increase his CPAP pressure from 8 cm to 10 cm. Todd [name] had an interim appointment with Todd [name] on 01/26/2017, at which time Todd [name] was again compliant with CPAP therapy but residual AHI was still suboptimal at over 30 per hour. Symptoms were mostly obstructive in nature and I suggested we increase his pressure to 13 cm.    I reviewed his CPAP compliance data from 03/11/2017 through 04/09/2017 which is a total of 30 days, during which time Todd [name] used his machine 100%, average usage of 7 hours and 38 minutes, residual AHI improved but still elevated at 21.7 per hour, Mostly obstructive in nature, leak acceptable with Todd 95th percentile at 14.2 L/m on a pressure of 13 cm with EPR of 3. Todd [name] reports doing well, tolerated Todd increase in pressure. Has had some numbness in Todd pinky fingers b/l, has a Hx of degenerative cervical spine d/s, s/p surgery in 76. Some R knee discomfort.  Using a Lg FFM, some leak, but overall okay, still feels Todd [name] has done quite well with using CPAP in Todd sense that Todd [name] feels improved, daytime energy better, headaches improved.   I first met him on 08/09/2016 at Todd request of his primary care physician, at which time Todd [name] reported snoring and excessive daytime somnolence as well as worsening nocturia. I invited him for  sleep study. Todd [name] had a split-night sleep study on 08/21/2016. We went over his test results today. Baseline sleep efficiency was markedly reduced at 50.8%, wake after sleep onset was 83 minutes, REM latency was normal, sleep latency delayed. Total AHI was 28.8 per hour, REM AHI was 67.5 per hour, O2 nadir was 83%.   Todd [name] was titrated on CPAP from 5 cm to 8 cm, AHI was 0 per hour on a pressure of 8 cm. Todd [name] had severe PLMS during Todd baseline and Todd CPAP titration portion of Todd study with minimal to mild arousals.   I reviewed his CPAP compliance data from 10/29/2016 through 11/27/2016, which is a total of 30 days, during which time Todd [name] used his CPAP every night with percent used days greater than 4 hours at 100%, indicating superb compliance with an average usage of 8 hours and 11 minutes, residual AHI elevated at 28.7 per hour, mostly obstructive in nature, leak low with Todd 95th percentile at 7.2 L/m on a pressure of 8 cm with EPR of 3.   08/21/2016: Todd [name] reports snoring and excessive daytime somnolence. His Epworth sleepiness score is 14 out of 24 today. Fatigue score is 15 out of 63. Snoring can be loud. Todd [name] denies restless leg symptoms. Todd [name] has had nocturia for years, worse in Todd past year with 3-4 bathroom visits per night. Todd [name] has seen urologist for this was advised that his prostate looked fine. Todd [name] quit smoking in 1973, drinks alcohol occasionally, and drinks about 3 cups of coffee per day but typically no sodas and decaf tea only. I reviewed your office note from 07/06/2016. 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 Todd [name], retired in 2012. Todd [name] tries to exercise regularly, and tries to drink enough water. Todd [name] has gained weight in Todd last few years, especially after his retirement.  Todd [name] does not watch TV in bed, lives with his wife, has 2 grown daughters, one in [place] and Todd other in [place], [place]. Todd [name]  suspects that his father may have had obstructive sleep apnea. Todd [name]  tries to keep a scheduled for his sleep time and wake up time. They have no pets at this time. Todd [name] likes to travel since his retirement.   His Past Medical History Is Significant For: Past Medical History:  Diagnosis Date   Arthritis    hands   Bladder stone    BPH (benign prostatic hyperplasia)    [name] ([place])    skin [name] multiple; followed by [facility]/dermatology yearly.   Colon polyps     Previous polyps on colonoscopy.  [facility].     Hearing aid worn    both ears   Hiatal hernia 10/20/2013   EGD confirmed.  [facility].   History of Barrett's esophagus 2014   pt told by dr Watt Climes no barrett's esophagus 03-03-2021   History of hypertension    no meds since 2019   History of prediabetes    non pre dm since june 2021 weight loss   Hyperlipemia    Neuromuscular disorder (HCC)    numbness rt hand   Seasonal allergies    Sleep apnea    uses CPAP nightly    His Past Surgical History Is Significant For: Past Surgical History:  Procedure Laterality Date   BLADDER STONE REMOVAL  2011, 2018   CARPAL TUNNEL RELEASE  01/17/2012   Procedure: CARPAL TUNNEL RELEASE;  Surgeon: Cammie Sickle., MD;  Location: [facility];  Service: Orthopedics;  Laterality: Right;   CERVICAL FUSION  1976   COLONOSCOPY  10/20/2013   [facility].  Polyps.    CYSTOSCOPY WITH LITHOLAPAXY N/A 01/09/2017   Procedure: CYSTOSCOPY WITH LITHOLAPAXY;  Surgeon: Franchot Gallo, MD;  Location: [facility];  Service: Urology;  Laterality: N/A;   ESOPHAGOGASTRODUODENOSCOPY  10/20/2013   [facility]; biopsy of area concernig for Barrett's; gastritis.   EYE SURGERY  2012   both cataracts   FRACTURE SURGERY Right 1960's   3 fingers   HOLMIUM LASER APPLICATION N/A 05/12/7208   Procedure: HOLMIUM LASER APPLICATION;  Surgeon: Franchot Gallo, MD;  Location: [facility];  Service: Urology;  Laterality: N/A;   QUADRICEPS TENDON REPAIR  2003   bilateral   TRANSURETHRAL RESECTION OF PROSTATE N/A  03/08/2021   Procedure: TRANSURETHRAL RESECTION OF Todd PROSTATE (TURP);  Surgeon: Franchot Gallo, MD;  Location[facility];  Service: Urology;  Laterality: N/A;   ULNAR NERVE TRANSPOSITION  01/17/2012   Procedure: ULNAR NERVE DECOMPRESSION/TRANSPOSITION;  Surgeon: Cammie Sickle., MD;  Location: [facility];  Service: Orthopedics;  Laterality: Right;  decompression of ulnar nerve only  at right cubital tunnel   ULNAR NERVE TRANSPOSITION Left 06/01/2017   Procedure: DECOMPRESSION ULNAR NERVE LEFT CUBITAL TUNNEL;  Surgeon: Leanora Cover, MD;  Location: [facility];  Service: Orthopedics;  Laterality: Left;   UPPER GASTROINTESTINAL ENDOSCOPY     VASECTOMY  yrs ago    His Family History Is Significant For: Family History  Problem Relation Age of Onset   Heart failure Mother        in 48s   Cirrhosis Father    Hypertension Father    Alcohol abuse Father    Sleep apnea Father    Arthritis Brother        Rheumatoid arthritis   Hypertension Brother    Neuropathy Neg Hx     His Social History Is Significant For: Social History   Socioeconomic History   Marital status: Married  Spouse name: Not on file   Number of children: 2   Years of education: 42   Highest education level: Not on file  Occupational History   Occupation: Retired  Tobacco Use   Smoking status: Former    Packs/day: 2.00    Years: 15.00    Pack years: 30.00    Types: Cigarettes    Quit date: 01/11/1974    Years since quitting: 47.9   Smokeless tobacco: Never  Vaping Use   Vaping Use: Never used  Substance and Sexual Activity   Alcohol use: Yes    Alcohol/week: 1.0 standard drink    Types: 1 Standard drinks or equivalent per week    Comment: occ   Drug use: No   Sexual activity: Not Currently  Other Topics Concern   Not on file  Social History Narrative   Marital status:  Married x 42 years      Children: 2 daughters; 3 grandchildren.      Employment;  retired in 1998 from [employer]; retired age 47 from [employer] after 14 years.      Lives: with wife.      Tobacco: former smoker; quit at age 18.  Smoked 15 years.      Alcohol:  On vacation or special occasions.        Education: [employer].       Exercise: Gym 3-4 times a week for 2 hours.; exercises 7 days per week.   Elliptical for one hour.      Advanced Directives: none; +desires FULL CODE.        ADLs: independent with ADLs; drives.  Does not walk with assistant devices.     Drinks 3-4 caffeine drinks a day    Social Determinants of Radio broadcast assistant Strain: Low Risk    Difficulty of Paying Living Expenses: Not hard at all  Food Insecurity: No Food Insecurity   Worried About Charity fundraiser in Todd Last Year: Never true   Arboriculturist in Todd Last Year: Never true  Transportation Needs: No Transportation Needs   Lack of Transportation (Medical): No   Lack of Transportation (Non-Medical): No  Physical Activity: Sufficiently Active   Days of Exercise per Week: 4 days   Minutes of Exercise per Session: 40 min  Stress: No Stress Concern Present   Feeling of Stress : Not at all  Social Connections: Socially Integrated   Frequency of Communication with Friends and Family: Twice a week   Frequency of Social Gatherings with Friends and Family: Twice a week   Attends Religious Services: More than 4 times per year   Active Member of [employer] or Organizations: Yes   Attends Archivist Meetings: 1 to 4 times per year   Marital Status: Married    His Allergies Are:  Allergies  Allergen Reactions   Shellfish Allergy     Headaches, high blood pressure   Ace Inhibitors Cough   Amoxicillin Nausea And Vomiting   Darvon     Chest pain   Toprol Xl [Metoprolol Tartrate]     Not sure reaction  :   His Current Medications Are:  Outpatient Encounter Medications as of 12/01/2021  Medication Sig   acetaminophen (TYLENOL) 500 MG tablet Take  1,000 mg by mouth every 6 (six) hours as needed.   Aspirin-Caffeine (BC FAST PAIN RELIEF ARTHRITIS PO) Take by mouth.   Digestive Enzymes (PAPAYA AND ENZYMES PO) Take by mouth.  fish oil-omega-3 fatty acids 1000 MG capsule daily. 5176 mg BID   GARLIC PO Take 160 mg by mouth 2 (two) times daily. 2 tabs bid per pt   Multiple Vitamin (MULTIVITAMIN) tablet Take 1 tablet by mouth daily.   pravastatin (PRAVACHOL) 20 MG tablet TAKE 1 TABLET BY MOUTH EVERY DAY CAN START FEW DAYS PER WEEK AND INCREASE IF TOLERATED   psyllium (METAMUCIL) 58.6 % powder Take 1 packet by mouth daily.   No facility-administered encounter medications on file as of 12/01/2021.  :  Review of Systems:  Out of a complete 14 point review of systems, all are reviewed and negative with Todd exception of these symptoms as listed below:  Review of Systems  Neurological:        Pt is here for neuropathy pain . Pt states Todd [name] feels tinging in both feet,toes,and ankles. Pt states Todd [name] has had these symptoms for 5 years .    Objective:  Neurological Exam  Physical Exam Physical Examination:   Vitals:   12/01/21 0735  BP: 122/72  Pulse: 75    General Examination: Todd [name] is a very pleasant 81 y.o. male in no acute distress. Todd [name] appears well-developed and well-nourished and well groomed.   HEENT: Normocephalic, atraumatic, pupils are equal, round and reactive to light, status post cataract repairs. Extraocular tracking is well preserved, hearing grossly intact, hearing aids.  Face is symmetric with normal facial animation, and normal facial sensation to light touch, temperature, vibration and pinprick.  Speech is clear without dysarthria, hypophonia or voice tremor.  Neck is supple with full range of motion, no carotid bruits.  Airway examination reveals mild mouth dryness, stable findings with moderate airway crowding, tongue protrudes centrally and palate elevates symmetrically.     Chest: Clear to auscultation without  wheezing, rhonchi or crackles noted.   Heart: S1+S2+0, regular and normal without murmurs, rubs or gallops noted.    Abdomen: Soft, non-tender and non-distended.   Extremities: There is no pitting edema in Todd distal lower extremities bilaterally.  Good pedal pulses.   Skin: Warm and dry without trophic changes noted. There are no varicose veins. Chronic sun exposure type changes.   Musculoskeletal: exam reveals prominent arthritic changes in both hands.  Todd [name] has limited range of motion in both shoulders, left more so than right.  Unremarkable surgical scars both knees.   Neurologically:  Mental status: Todd [name] is awake, alert and oriented in all 4 spheres. His immediate and remote memory, attention, language skills and fund of knowledge are appropriate. There is no evidence of aphasia, agnosia, apraxia or anomia. Speech is clear with normal prosody and enunciation. Thought process is linear. Mood is normal and affect is normal.  Cranial nerves II - XII are as described above under HEENT exam. Motor exam: Normal bulk, strength and tone is noted. There is no tremor. Fine motor skills and coordination: grossly intact neck with some limitation in hand movements due to arthritis.  Cerebellar testing: No dysmetria or intention tremor. There is no truncal or gait ataxia.  Finger-to-nose unremarkable heel-to-shin unremarkable. Sensory exam: intact to light touch, pinprick, temperature and vibration sense in Todd upper extremities with sensitivity to pinprick in both feet and mildly reduced vibration sense in both toes.   Reflexes are 1+ in both upper extremities, absent in both lower extremities. Gait, station and balance: Todd [name] stands easily.  No difficulty walking.     Assessment and plan:  In summary, AXEL FRISK is a very pleasant 72-year  old male with an underlying medical history of hypertension, history of arthritis, s/p b/l knee surgeries in 2003 (with significant injuries sustained after a  fall), skin [name], cataract, status post repairs, hiatal hernia, hyperlipidemia, history of angioedema, status post neck surgery, and obesity, OSA on CPAP of 13 cm, who presents for evaluation of his paresthesias affecting both feet for Todd past several years.  Todd [name] does not have any obvious cause for this.  Todd [name] is not diabetic, was previously labeled as prediabetic but A1c has been quite good.  Todd [name] has pursued a very healthy lifestyle, is physically very active, hydrates well, limits his caffeine and alcohol.  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  Todd [name] is not endorsing any pain and does not wish to pursue any symptomatic treatment but would like to see if there is a cause for this.  Todd [name] is advised that often we find evidence of mild neuropathy but no telltale cause.  I would like to pursue additional blood work, Todd [name] had blood work last month with his primary care physician but I would like to add a few things today including autoimmune marker, inflammatory markers, protein electrophoresis.  I would like to also go ahead with an EMG/nerve conduction velocity test.  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  We will call him with his test results and follow-up in this clinic accordingly.  Todd [name] is also encouraged to continue with his CPAP.  Todd [name] is highly commended for his treatment adherence and advised that I would like to keep his pressure at 13 cm for now.  Todd [name] is advised to keep his appointment for sleep apnea checkup next year with [facility] as well.  I answered all his questions today and Todd [name] was in agreement with our plan.  I spent 45 minutes in total face-to-face time and in reviewing records during pre-charting, more than 50% of which was spent in counseling and coordination of care, reviewing test results, reviewing medications and treatment regimen and/or in discussing or reviewing Todd  diagnosis of paresthesias, Todd prognosis and treatment options. Pertinent laboratory and imaging test results that were available during this visit with Todd [name] were reviewed by me and considered in my medical decision making (see chart for details).

## 2021-12-01 NOTE — Patient Instructions (Addendum)
It was so nice to see you again and to catch up!  I think you have done a great job with your lifestyle and you are fully compliant with your CPAP.  Your respiratory event numbers are much better.  Please continue with CPAP of 13 cm.  You may be eligible for new machine next year, we will look into it at that appointment.  You may have a condition called peripheral neuropathy, i. e. nerve damage. Unfortunately, as I mentioned, there is no specific treatment for most neuropathies. The most common cause for neuropathy is diabetes in this [place], in which case, tight glucose control is key.  Some studies suggest that obesity and prediabetes can also cause nerve damage even in the absence of a formal diagnosis of diabetes.    Other causes include thyroid disease, and some vitamin deficiencies. Certain medications such as chemotherapy agents and other chemicals or toxins including alcohol can cause neuropathy. There are some genetic conditions or hereditary neuropathies. Typically patients will report a family history of neuropathy in those conditions. There are cases associated with cancers and autoimmune conditions. Most neuropathies are progressive unless a root cause can be found and treated, which is rare, as I explained. For most neuropathies there is no actual cure or reversing of symptoms. Painful neuropathy can be difficult to treat symptomatically, but there are some medications available to ease the symptoms.  Thankfully, you have no significant pain symptoms at this time and can be monitored for symptoms.    Electrophysiologic testing with nerve conduction velocity studies and EMG (muscle testing) do not always pick up neuropathies that affect the smallest fibers. Other common tests include different type of blood work, and rarely, spinal fluid testing, and sometimes we resort to asking for a nerve and muscle biopsy.  We can also consider specialist input from a neuromuscular specialist, sometimes  we make referrals to [facility] or [facility] or [facility] if the need arises.  Your findings are fairly benign and on the mild side, not alarming.  For now, as discussed, we will proceed with further work-up from my end of things: We will check blood work today and call you with the test results.  We will do an EMG and nerve conduction velocity test, which is an electrical nerve and muscle test, which we will schedule. We will call you with the results.  Depending on your test results, we will arrange for follow-up.  Please also keep your routine sleep apnea follow-up appointment for January next year with [facility].  Have fun on your cruise, travel safely!

## 2021-12-07 ENCOUNTER — Telehealth: Payer: Self-pay | Admitting: *Deleted

## 2021-12-07 LAB — B12 AND FOLATE PANEL
Folate: >20 ng/mL (ref >3.0)
Vitamin B-12: 577 pg/mL (ref 232–1245)

## 2021-12-07 LAB — MULTIPLE MYELOMA PANEL, SERUM
Albumin SerPl Elph-Mcnc: 4.3 g/dL (ref 2.9–4.4)
Albumin/Glob SerPl: 1.5 (ref 0.7–1.7)
Alpha 1: 0.2 g/dL (ref 0.0–0.4)
Alpha2 Glob SerPl Elph-Mcnc: 0.7 g/dL (ref 0.4–1.0)
B-Globulin SerPl Elph-Mcnc: 1 g/dL (ref 0.7–1.3)
Gamma Glob SerPl Elph-Mcnc: 1.1 g/dL (ref 0.4–1.8)
Globulin, Total: 2.9 g/dL (ref 2.2–3.9)
IgA/Immunoglobulin A, Serum: 213 mg/dL (ref 61–437)
IgG (Immunoglobin G), Serum: 937 mg/dL (ref 603–1613)
IgM (Immunoglobulin M), Srm: 85 mg/dL (ref 15–143)
Total Protein: 7.2 g/dL (ref 6.0–8.5)

## 2021-12-07 LAB — RHEUMATOID FACTOR: Rheumatoid fact SerPl-aCnc: <10 IU/mL (ref <14.0)

## 2021-12-07 LAB — C-REACTIVE PROTEIN: CRP: <1 mg/L (ref 0–10)

## 2021-12-07 LAB — VITAMIN B1: Thiamine: 123.6 nmol/L (ref 66.5–200.0)

## 2021-12-07 LAB — HEAVY METALS PROFILE II, BLOOD
Arsenic: 2 ug/L (ref 0–9)
Cadmium: <0.5 ug/L (ref 0.0–1.2)
Lead, Blood: 1.4 ug/dL (ref 0.0–3.4)
Mercury: 2.1 ug/L (ref 0.0–14.9)

## 2021-12-07 LAB — ANA W/REFLEX: Anti Nuclear Antibody (ANA): NEGATIVE

## 2021-12-07 LAB — RPR: RPR Ser Ql: NONREACTIVE

## 2021-12-07 LAB — VITAMIN B6: Vitamin B6: 26.3 ug/L (ref 3.4–65.2)

## 2021-12-07 NOTE — Telephone Encounter (Signed)
-----   Message from Star Age, MD sent at 12/07/2021  3:30 PM EST ----- Labs are normal, including vitamin B12, B6, B1, inflammatory and autoimmune markers.  Please update patient.

## 2022-01-26 ENCOUNTER — Encounter (INDEPENDENT_AMBULATORY_CARE_PROVIDER_SITE_OTHER): Payer: Medicare Other | Admitting: Neurology

## 2022-01-26 ENCOUNTER — Ambulatory Visit (INDEPENDENT_AMBULATORY_CARE_PROVIDER_SITE_OTHER): Payer: Medicare Other | Admitting: Neurology

## 2022-01-26 ENCOUNTER — Telehealth: Payer: Self-pay | Admitting: *Deleted

## 2022-01-26 DIAGNOSIS — M1990 Unspecified osteoarthritis, unspecified site: Secondary | ICD-10-CM

## 2022-01-26 DIAGNOSIS — R202 Paresthesia of skin: Secondary | ICD-10-CM

## 2022-01-26 DIAGNOSIS — G4733 Obstructive sleep apnea (adult) (pediatric): Secondary | ICD-10-CM

## 2022-01-26 DIAGNOSIS — R200 Anesthesia of skin: Secondary | ICD-10-CM

## 2022-01-26 DIAGNOSIS — Z0289 Encounter for other administrative examinations: Secondary | ICD-10-CM

## 2022-01-26 NOTE — Telephone Encounter (Signed)
-----   Message from Star Age, MD sent at 01/26/2022 12:11 PM EDT ----- ?Please call patient and advise him that his electrical nerve and muscle test through our office was reported as normal which is very reassuring, we did some blood work in January which was also benign.  At this juncture, I recommend that he keep his appointment for sleep apnea recheck as scheduled for next year. ?

## 2022-01-26 NOTE — Telephone Encounter (Signed)
Called pt & LVM (ok per DPR) with EMG/NCV results and recommendations as noted below by Dr Rexene Alberts. Left office number for call back if needed.  ?

## 2022-01-26 NOTE — Procedures (Signed)
? ? ? ?   ?Full Name: Todd [name] Gender: Male ?MRN #: [id number] Date of Birth: December 24, 1940 ?   ?Visit Date: 01/26/2022 07:47 ?Age: 81 Years ?Examining Physician: Marcial Pacas, MD  ?Referring Physician: Star Age, MD ?History: 81 year old male complains few years history of symmetric bilateral plantar feet and toes paresthesia ? ?On examination: Bilateral upper and lower extremity strength is normal, hyperreflexia, mildly length-dependent decreased light touch, vibratory sensation to ankle level. ? ?Summary of the test: ? ?Nerve conduction study: ? ?Bilateral sural, superficial peroneal sensory responses were normal.  Bilateral peroneal to EDB and tibial motor responses were normal. ? ?Electromyography: ? ?Examination of bilateral lower extremity muscles were normal. ? ? ?Conclusion: ?This is a normal study.  There is no electrodiagnostic evidence of large fiber peripheral neuropathy, or bilateral lumbosacral radiculopathy. ? ? ? ?------------------------------- ?Marcial Pacas M.D. Ph.D. ? ?[facility] ?[address], [address] ?[place], [place] [address] ?Tel: [phone] ?Fax: [phone] ? ?Verbal informed consent was obtained from the patient, patient was informed of potential risk of procedure, including bruising, bleeding, hematoma formation, infection, muscle weakness, muscle pain, numbness, among others. ?   ? ?   ?[place] ?   ?Nerve / Sites Muscle Latency Ref. Amplitude Ref. Rel Amp Segments Distance Velocity Ref. Area  ?  ms ms mV mV %  cm m/s m/s mVms  ?R Peroneal - EDB  ?   Ankle EDB 3.1 ?6.5 5.6 ?2.0 100 Ankle - EDB 9   16.8  ?   Fib head EDB 10.3  5.1  90.9 Fib head - Ankle 33 46 ?44 15.9  ?   Pop fossa EDB 12.5  4.9  95.7 Pop fossa - Fib head 10 46 ?44 16.1  ?       Pop fossa - Ankle      ?L Peroneal - EDB  ?   Ankle EDB 4.1 ?6.5 7.0 ?2.0 100 Ankle - EDB 9   20.9  ?   Fib head EDB 10.9  6.3  89.3 Fib head - Ankle 33 49 ?44 20.1  ?   Pop fossa EDB 12.9  6.3  101 Pop fossa - Fib head 10 49 ?44  19.3  ?       Pop fossa - Ankle      ?R Tibial - AH  ?   Ankle AH 3.9 ?5.8 7.0 ?4.0 100 Ankle - AH 9   21.7  ?   Pop fossa AH 14.0  5.0  71.1 Pop fossa - Ankle 45 44 ?41 20.3  ?L Tibial - AH  ?   Ankle AH 3.2 ?5.8 5.1 ?4.0 100 Ankle - AH 9   18.1  ?   Pop fossa AH 13.4  4.2  83.4 Pop fossa - Ankle 44 43 ?41 16.5  ?           ?[place] ?   ?Nerve / Sites Rec. Site Peak Lat Ref.  Amp Ref. Segments Distance  ?  ms ms ?V ?V  cm  ?R Sural - Ankle (Calf)  ?   Calf Ankle 3.0 ?4.4 6 ?6 Calf - Ankle 14  ?L Sural - Ankle (Calf)  ?   Calf Ankle 3.4 ?4.4 8 ?6 Calf - Ankle 14  ?R Superficial peroneal - Ankle  ?   Lat leg Ankle 3.2 ?4.4 6 ?6 Lat leg - Ankle 14  ?L Superficial peroneal - Ankle  ?   Lat leg Ankle 3.1 ?4.4 8 ?6 Lat leg -  Ankle 14  ?           ?F  [place] ?   ?Nerve F Lat Ref.  ? ms ms  ?R Tibial - AH 47.3 ?56.0  ?L Tibial - AH 46.9 ?56.0  ?       ?EMG Summary Table   ? Spontaneous MUAP Recruitment  ?Muscle IA Fib PSW Fasc Other Amp Dur. Poly Pattern  ?L. Tibialis anterior Normal None None None _______ Normal Normal Normal Normal  ?L. Tibialis posterior Normal None None None _______ Normal Normal Normal Normal  ?L. Peroneus longus Normal None None None _______ Normal Normal Normal Normal  ?L. Gastrocnemius (Medial head) Normal None None None _______ Normal Normal Normal Normal  ?L. Vastus lateralis Normal None None None _______ Normal Normal Normal Normal  ?R. Tibialis anterior Normal None None None _______ Normal Normal Normal Normal  ?R. Tibialis posterior Normal None None None _______ Normal Normal Normal Normal  ?R. Peroneus longus Normal None None None _______ Normal Normal Normal Normal  ?R. Gastrocnemius (Medial head) Normal None None None _______ Normal Normal Normal Normal  ?R. Vastus lateralis Normal None None None _______ Normal Normal Normal Normal  ? ?  ?

## 2022-04-20 ENCOUNTER — Encounter: Payer: Self-pay | Admitting: Family Medicine

## 2022-04-20 ENCOUNTER — Ambulatory Visit (INDEPENDENT_AMBULATORY_CARE_PROVIDER_SITE_OTHER): Payer: Medicare Other | Admitting: Family Medicine

## 2022-04-20 VITALS — BP 134/74 | HR 83 | Temp 98.3°F | Resp 16 | Ht 71.0 in | Wt 209.0 lb

## 2022-04-20 DIAGNOSIS — M25561 Pain in right knee: Secondary | ICD-10-CM | POA: Diagnosis not present

## 2022-04-20 DIAGNOSIS — M25661 Stiffness of right knee, not elsewhere classified: Secondary | ICD-10-CM | POA: Diagnosis not present

## 2022-04-20 MED ORDER — DICLOFENAC SODIUM 1 % EX GEL: 4.0000 g | Freq: Four times a day (QID) | CUTANEOUS | 1 refills | Status: DC

## 2022-04-20 NOTE — Progress Notes (Signed)
Subjective:  Patient ID: Todd [name], male    DOB: 11/29/1940  Age: 81 y.o. MRN: [id number]  CC:  Chief Complaint  Patient presents with   Knee Pain    Pt reports some limited mobility in his Rt leg and notes some worsened stiffness after work outs, wanted to have this looked at as he does not plan to stop working out     HPI Todd [name] presents for   R knee stiffness: Past month - NKI,Tight, discomfort with flexion of R knee. Trouble bending past 90 degrees. Decreased mobility, some stiffness  - notes with activity/exercise.  No known injury. Exercise bike 3 days per week for hour, resistance training, hack squats. More stiff with riding bike. Exercising at home. Prior road cyclist, not now with traffic.  Feeling of giving way after workout.  No falls.  Does not feel swollen or red. No hx of gout.  Alleve as needed - episodic, BC with workouts - fair relief.  Dr. Rhona Raider for knee surgery in past - patella issue.     History Patient Active Problem List   Diagnosis Date Noted   Enlarged prostate with urinary obstruction 03/08/2021   Prediabetes 03/07/2019   Bladder stone 03/20/2017   Nephrolithiasis 03/20/2017   Sensorineural hearing loss (SNHL) of both ears 03/20/2017   OSA on CPAP 02/21/2017   Primary osteoarthritis of both hands 03/01/2016   Mixed hyperlipidemia 03/01/2016   Seasonal allergic rhinitis due to pollen 03/01/2016   BMI 31.0-31.9,adult 03/01/2016   Obesity 03/01/2016   Angioedema 02/16/2013   Past Medical History:  Diagnosis Date   Arthritis    hands   Bladder stone    BPH (benign prostatic hyperplasia)    Cancer ([place])    skin cancer multiple; followed by [facility]/dermatology yearly.   Colon polyps     Previous polyps on colonoscopy.  [facility].     Hearing aid worn    both ears   Hiatal hernia 10/20/2013   EGD confirmed.  [facility].   History of Barrett's esophagus 2014   pt told by dr Watt Climes no barrett's esophagus 03-03-2021   History of hypertension     no meds since 2019   History of prediabetes    non pre dm since june 2021 weight loss   Hyperlipemia    Neuromuscular disorder ([facility])    numbness rt hand   Seasonal allergies    Sleep apnea    uses CPAP nightly   Past Surgical History:  Procedure Laterality Date   BLADDER STONE REMOVAL  2011, 2018   CARPAL TUNNEL RELEASE  01/17/2012   Procedure: CARPAL TUNNEL RELEASE;  Surgeon: Cammie Sickle., MD;  Location: [facility];  Service: Orthopedics;  Laterality: Right;   CERVICAL FUSION  1976   COLONOSCOPY  10/20/2013   [facility].  Polyps.    CYSTOSCOPY WITH LITHOLAPAXY N/A 01/09/2017   Procedure: CYSTOSCOPY WITH LITHOLAPAXY;  Surgeon: Franchot Gallo, MD;  Location[facility];  Service: Urology;  Laterality: N/A;   ESOPHAGOGASTRODUODENOSCOPY  10/20/2013   [facility]; biopsy of area concernig for Barrett's; gastritis.   EYE SURGERY  2012   both cataracts   FRACTURE SURGERY Right 1960's   3 fingers   HOLMIUM LASER APPLICATION N/A 0/0/9381   Procedure: HOLMIUM LASER APPLICATION;  Surgeon: Franchot Gallo, MD;  Location: [facility];  Service: Urology;  Laterality: N/A;   QUADRICEPS TENDON REPAIR  2003   bilateral   TRANSURETHRAL RESECTION OF PROSTATE  N/A 03/08/2021   Procedure: TRANSURETHRAL RESECTION OF THE PROSTATE (TURP);  Surgeon: Franchot Gallo, MD;  Location: [facility];  Service: Urology;  Laterality: N/A;   ULNAR NERVE TRANSPOSITION  01/17/2012   Procedure: ULNAR NERVE DECOMPRESSION/TRANSPOSITION;  Surgeon: Cammie Sickle., MD;  Location: [facility];  Service: Orthopedics;  Laterality: Right;  decompression of ulnar nerve only  at right cubital tunnel   ULNAR NERVE TRANSPOSITION Left 06/01/2017   Procedure: DECOMPRESSION ULNAR NERVE LEFT CUBITAL TUNNEL;  Surgeon: Leanora Cover, MD;  Location: [facility];  Service: Orthopedics;  Laterality: Left;   UPPER GASTROINTESTINAL ENDOSCOPY     VASECTOMY   yrs ago   Allergies  Allergen Reactions   Shellfish Allergy     Headaches, high blood pressure   Ace Inhibitors Cough   Amoxicillin Nausea And Vomiting   [place]     Chest pain   Toprol Xl [Metoprolol Tartrate]     Not sure reaction   Prior to Admission medications   Medication Sig Start Date End Date Taking? Authorizing Provider  acetaminophen (TYLENOL) 500 MG tablet Take 1,000 mg by mouth every 6 (six) hours as needed.   Yes [provider]  Aspirin-Caffeine (BC FAST PAIN RELIEF ARTHRITIS PO) Take by mouth.   Yes [provider]  Digestive Enzymes (PAPAYA AND ENZYMES PO) Take by mouth.   Yes [provider]  fish oil-omega-3 fatty acids 1000 MG capsule daily. 2000 mg BID   Yes [provider]  GARLIC PO Take 767 mg by mouth 2 (two) times daily. 2 tabs bid per pt   Yes [provider]  Multiple Vitamin (MULTIVITAMIN) tablet Take 1 tablet by mouth daily.   Yes [provider]  pravastatin (PRAVACHOL) 20 MG tablet TAKE 1 TABLET BY MOUTH EVERY DAY CAN START FEW DAYS PER WEEK AND INCREASE IF TOLERATED 10/12/21  Yes Wendie Agreste, MD  psyllium (METAMUCIL) 58.6 % powder Take 1 packet by mouth daily.   Yes [provider]   Social History   Socioeconomic History   Marital status: Married    Spouse name: Not on file   Number of children: 2   Years of education: 12   Highest education level: Not on file  Occupational History   Occupation: Retired  Tobacco Use   Smoking status: Former    Packs/day: 2.00    Years: 15.00    Total pack years: 30.00    Types: Cigarettes    Quit date: 01/11/1974    Years since quitting: 48.3   Smokeless tobacco: Never  Vaping Use   Vaping Use: Never used  Substance and Sexual Activity   Alcohol use: Yes    Alcohol/week: 1.0 standard drink of alcohol    Types: 1 Standard drinks or equivalent per week    Comment: occ   Drug use: No   Sexual activity: Not Currently  Other Topics  Concern   Not on file  Social History Narrative   Marital status:  Married x 42 years      Children: 2 daughters; 3 grandchildren.      Employment; retired in 1998 from [employer]; retired age 72 from [employer] after 14 years.      Lives: with wife.      Tobacco: former smoker; quit at age 50.  Smoked 15 years.      Alcohol:  On vacation or special occasions.        Education: [employer].  Exercise: Gym 3-4 times a week for 2 hours.; exercises 7 days per week.   Elliptical for one hour.      Advanced Directives: none; +desires FULL CODE.        ADLs: independent with ADLs; drives.  Does not walk with assistant devices.     Drinks 3-4 caffeine drinks a day    Social Determinants of Health   Financial Resource Strain: Low Risk  (10/14/2021)   Overall Financial Resource Strain (CARDIA)    Difficulty of Paying Living Expenses: Not hard at all  Food Insecurity: No Food Insecurity (10/14/2021)   Hunger Vital Sign    Worried About Running Out of Food in the Last Year: Never true    Ran Out of Food in the Last Year: Never true  Transportation Needs: No Transportation Needs (10/14/2021)   PRAPARE - Hydrologist (Medical): No    Lack of Transportation (Non-Medical): No  Physical Activity: Sufficiently Active (10/14/2021)   Exercise Vital Sign    Days of Exercise per Week: 4 days    Minutes of Exercise per Session: 40 min  Stress: No Stress Concern Present (10/14/2021)   [place]    Feeling of Stress : Not at all  Social Connections: [place] (10/14/2021)   Social Connection and Isolation Panel [NHANES]    Frequency of Communication with Friends and Family: Twice a week    Frequency of Social Gatherings with Friends and Family: Twice a week    Attends Religious Services: More than 4 times per year    Active Member of [employer] or Organizations: Yes    Attends English as a second language teacher Meetings: 1 to 4 times per year    Marital Status: Married  Human resources officer Violence: Not At Risk (10/14/2021)   Humiliation, Afraid, Rape, and Kick questionnaire    Fear of Current or Ex-Partner: No    Emotionally Abused: No    Physically Abused: No    Sexually Abused: No    Review of Systems Per HPI.   Objective:   Vitals:   04/20/22 1509  BP: 134/74  Pulse: 83  Resp: 16  Temp: 98.3 F (36.8 C)  TempSrc: Temporal  SpO2: 97%  Weight: 209 lb (94.8 kg)  Height: '5\' 11"'$  (1.803 m)     Physical Exam Vitals reviewed.  Constitutional:      General: He is not in acute distress.    Appearance: Normal appearance. He is well-developed.  HENT:     Head: Normocephalic and atraumatic.  Cardiovascular:     Rate and Rhythm: Normal rate.  Pulmonary:     Effort: Pulmonary effort is normal.  Musculoskeletal:     Comments: Right knee, skin intact, no erythema, no wounds.  No appreciable effusion.  No focal bony tenderness.  Full extension, flexion limited to approximately 90 degrees with posterior knee discomfort/tightness sensation.  Crepitus with McMurray testing and some discomfort at terminal flexion, posteriorly.  Negative varus/valgus testing as well as Lachman, anterior/posterior drawer.  Neurological:     Mental Status: He is alert and oriented to person, place, and time.  Psychiatric:        Mood and Affect: Mood normal.        Assessment & Plan:  DEONDRE MARINARO is a 81 y.o. male . 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  No appreciable effusion.  Only episodic mechanical symptoms after workouts.  Question home exercise bike  fit as that appears to be the activity that causes the worst symptoms.  He plans to adjust seat height initially.  Link given to read about bike fit   Activity modification discussed.  Initial trial of diclofenac gel, check imaging, recheck next few weeks for possible corticosteroid injection or can follow-up with Ortho if not improving.  RTC precautions if worse.  Meds ordered this encounter  Medications   diclofenac Sodium (VOLTAREN) 1 % GEL    Sig: Apply 4 g topically 4 (four) times daily.    Dispense:  150 g    Refill:  1   Patient Instructions  Take a look at info on bike fit for your exercise bike. Here is one option: [email or web address] ok to continue to stay active, but avoid activities that worsen soreness.  Try diclofenac 4 times per day.  Recheck with me in 2-3 weeks or ortho if not better for possible injection.   [facility] for xray.  Walk in 8:30-4:30 during weekdays, no appointment needed [place].  [place], [place] [address]  Acute Knee Pain, Adult Acute knee pain is sudden and may be caused by damage, swelling, or irritation of the muscles and tissues that support the knee. Pain may result from: A fall. An injury to the knee from twisting motions. A hit to the knee. Infection. Acute knee pain may go away on its own with time and rest. If it does not, your health care provider may order tests to find the cause of the pain. These may include: Imaging tests, such as an X-ray, MRI, CT scan, or ultrasound. Joint aspiration. In this test, fluid is removed from the knee and evaluated. Arthroscopy. In this test, a lighted tube is inserted into the knee and an image is projected onto a TV screen. Biopsy. In this test, a sample of tissue is removed from the body and studied under a microscope. Follow these instructions at home: If you have a knee sleeve or brace:  Wear the knee sleeve or brace as told by your health care provider. Remove  it only as told by your health care provider. Loosen it if your toes tingle, become numb, or turn cold and blue. Keep it clean. If the knee sleeve or brace is not waterproof: Do not let it get wet. Cover it with a watertight covering when you take a bath or shower. Activity Rest your knee. Do not do things that cause pain or make pain worse. Avoid high-impact activities or exercises, such as running, jumping rope, or doing jumping jacks. Work with a physical therapist to make a safe exercise program, as recommended by your health care provider. Do exercises as told by your physical therapist. Managing pain, stiffness, and swelling  If directed, put ice on the affected knee. To do this: If you have a removable knee sleeve or brace, remove it as told by your health care provider. Put ice in a plastic bag. Place a towel between your skin and the bag. Leave the ice on for 20 minutes, 2-3 times a day. Remove the ice if your skin turns bright red. This is very important. If you cannot feel pain, heat, or cold, you have a greater risk of  damage to the area. If directed, use an elastic bandage to put pressure (compression) on your injured knee. This may control swelling, give support, and help with discomfort. Raise (elevate) your knee above the level of your heart while you are sitting or lying down. Sleep with a pillow under your knee. General instructions Take over-the-counter and prescription medicines only as told by your health care provider. Do not use any products that contain nicotine or tobacco, such as cigarettes, e-cigarettes, and chewing tobacco. If you need help quitting, ask your health care provider. If you are overweight, work with your health care provider and a dietitian to set a weight-loss goal that is healthy and reasonable for you. Extra weight can put pressure on your knee. Pay attention to any changes in your symptoms. Keep all follow-up visits. This is important. Contact  a health care provider if: Your knee pain continues, changes, or gets worse. You have a fever along with knee pain. Your knee feels warm to the touch or is red. Your knee buckles or locks up. Get help right away if: Your knee swells, and the swelling becomes worse. You cannot move your knee. You have severe pain in your knee that cannot be managed with pain medicine. Summary Acute knee pain can be caused by a fall, an injury, an infection, or damage, swelling, or irritation of the tissues that support your knee. Your health care provider may perform tests to find out the cause of the pain. Pay attention to any changes in your symptoms. Relieve your pain with rest, medicines, light activity, and the use of ice. Get help right away if your knee swells, you cannot move your knee, or you have severe pain that cannot be managed with medicine. This information is not intended to replace advice given to you by your health care provider. Make sure you discuss any questions you have with your health care provider. Document Revised: 04/08/2020 Document Reviewed: 04/08/2020 Elsevier Patient Education  [place],   Merri Ray, MD [place], [facility] 04/20/22 3:57 PM

## 2022-04-20 NOTE — Patient Instructions (Addendum)
Take a look at info on bike fit for your exercise bike. Here is one option: [email or web address] ok to continue to stay active, but avoid activities that worsen soreness.  Try diclofenac 4 times per day.  Recheck with me in 2-3 weeks or ortho if not better for possible injection.   [facility] for xray.  Walk in 8:30-4:30 during weekdays, no appointment needed [place].  [place], [place] [address]  Acute Knee Pain, Adult Acute knee pain is sudden and may be caused by damage, swelling, or irritation of the muscles and tissues that support the knee. Pain may result from: A fall. An injury to the knee from twisting motions. A hit to the knee. Infection. Acute knee pain may go away on its own with time and rest. If it does not, your health care provider may order tests to find the cause of the pain. These may include: Imaging tests, such as an X-ray, MRI, CT scan, or ultrasound. Joint aspiration. In this test, fluid is removed from the knee and evaluated. Arthroscopy. In this test, a lighted tube is inserted into the knee and an image is projected onto a TV screen. Biopsy. In this test, a sample of tissue is removed from the body and studied under a microscope. Follow these instructions at home: If you have a knee sleeve or brace:  Wear the knee sleeve or brace as told by your health care provider. Remove it only as told by your health care provider. Loosen it if your toes tingle, become numb, or turn cold and blue. Keep it clean. If the knee sleeve or brace is not waterproof: Do not let it get wet. Cover it with a watertight covering when you take a bath or shower. Activity Rest your knee. Do not do things that cause pain or make pain worse. Avoid high-impact activities or exercises, such as running, jumping rope, or doing jumping jacks. Work with a physical therapist to make a safe exercise program, as recommended by your health care provider. Do  exercises as told by your physical therapist. Managing pain, stiffness, and swelling  If directed, put ice on the affected knee. To do this: If you have a removable knee sleeve or brace, remove it as told by your health care provider. Put ice in a plastic bag. Place a towel between your skin and the bag. Leave the ice on for 20 minutes, 2-3 times a day. Remove the ice if your skin turns bright red. This is very important. If you cannot feel pain, heat, or cold, you have a greater risk of damage to the area. If directed, use an elastic bandage to put pressure (compression) on your injured knee. This may control swelling, give support, and help with discomfort. Raise (elevate) your knee above the level of your heart while you are sitting or lying down. Sleep with a pillow under your knee. General instructions Take over-the-counter and prescription medicines only as told by your health care provider. Do not use any products that contain nicotine or tobacco, such as cigarettes, e-cigarettes, and chewing tobacco. If you need help quitting, ask your health care provider. If you are overweight, work with your health care provider and a dietitian to set a weight-loss goal that is healthy and reasonable for you. Extra weight can put pressure on your knee. Pay attention to any changes in your symptoms. Keep all follow-up visits. This is important. Contact a health care provider if: Your knee pain continues, changes, or gets worse.  You have a fever along with knee pain. Your knee feels warm to the touch or is red. Your knee buckles or locks up. Get help right away if: Your knee swells, and the swelling becomes worse. You cannot move your knee. You have severe pain in your knee that cannot be managed with pain medicine. Summary Acute knee pain can be caused by a fall, an injury, an infection, or damage, swelling, or irritation of the tissues that support your knee. Your health care provider may  perform tests to find out the cause of the pain. Pay attention to any changes in your symptoms. Relieve your pain with rest, medicines, light activity, and the use of ice. Get help right away if your knee swells, you cannot move your knee, or you have severe pain that cannot be managed with medicine. This information is not intended to replace advice given to you by your health care provider. Make sure you discuss any questions you have with your health care provider. Document Revised: 04/08/2020 Document Reviewed: 04/08/2020 Elsevier Patient Education  [place].

## 2022-04-21 ENCOUNTER — Ambulatory Visit (INDEPENDENT_AMBULATORY_CARE_PROVIDER_SITE_OTHER)
Admission: RE | Admit: 2022-04-21 | Discharge: 2022-04-21 | Disposition: A | Discharge status: Home / Self Care | Payer: Medicare Other | Source: Ambulatory Visit | Attending: Family Medicine | Admitting: Family Medicine

## 2022-04-21 DIAGNOSIS — M25661 Stiffness of right knee, not elsewhere classified: Secondary | ICD-10-CM

## 2022-04-21 DIAGNOSIS — M25561 Pain in right knee: Secondary | ICD-10-CM

## 2022-04-25 ENCOUNTER — Other Ambulatory Visit: Payer: Self-pay | Admitting: Family Medicine

## 2022-04-25 DIAGNOSIS — E785 Hyperlipidemia, unspecified: Secondary | ICD-10-CM

## 2022-05-19 ENCOUNTER — Encounter: Payer: Self-pay | Admitting: Family Medicine

## 2022-05-19 ENCOUNTER — Ambulatory Visit (INDEPENDENT_AMBULATORY_CARE_PROVIDER_SITE_OTHER): Payer: Medicare Other | Admitting: Family Medicine

## 2022-05-19 VITALS — BP 128/70 | HR 73 | Temp 97.5°F | Resp 16 | Ht 71.0 in | Wt 210.0 lb

## 2022-05-19 DIAGNOSIS — M25661 Stiffness of right knee, not elsewhere classified: Secondary | ICD-10-CM | POA: Diagnosis not present

## 2022-05-19 DIAGNOSIS — M1711 Unilateral primary osteoarthritis, right knee: Secondary | ICD-10-CM | POA: Diagnosis not present

## 2022-05-19 DIAGNOSIS — M25561 Pain in right knee: Secondary | ICD-10-CM | POA: Diagnosis not present

## 2022-05-19 NOTE — Progress Notes (Signed)
Subjective:  Patient ID: Todd [name], male    DOB: 01-27-1941  Age: 81 y.o. MRN: [id number]  CC:  Chief Complaint  Patient presents with   Knee Pain    Pt reports knee has been better but not resolved     HPI Todd VANZANTEN presents for   R knee pain: Follow-up from June 14.  Right knee stiffness, decreased mobility at that time without known injury.  He was using an exercise bike, resistance training and squats.  Previously had been evaluated by orthopedist, Dr. Rhona [name] with patella issue.  Suspected degenerative joint disease versus degenerative meniscal disease with stiffness on terminal flexion.  No appreciable effusion and only episodic mechanical symptoms after workouts.  Discussed exercise bike fit as that was the activity that seem to worsen his symptoms.  He plans to adjust seat height and link given to read about bike fit.  Activity modification discussed as well as topical diclofenac up to 4 times daily.  X-ray obtained indicating moderate tricompartmental osteoarthritis most severe within the medial compartment with progression from previous imaging.  Since last visit knee is better. Adjusted routine and bike seat height. 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  Still some soreness. Still tight to cross legs. Not having mechanical symptoms.  Very active.  Tx: blue emu topical, initial voltaren gel - off now.      History Patient Active Problem List   Diagnosis Date Noted   Enlarged prostate with urinary obstruction 03/08/2021   Prediabetes 03/07/2019   Bladder stone 03/20/2017   Nephrolithiasis 03/20/2017   Sensorineural hearing loss (SNHL) of both ears 03/20/2017   OSA on CPAP 02/21/2017   Primary osteoarthritis of both hands 03/01/2016   Mixed hyperlipidemia 03/01/2016   Seasonal allergic rhinitis due to pollen 03/01/2016   BMI 31.0-31.9,adult 03/01/2016   Obesity 03/01/2016   Angioedema 02/16/2013   Past Medical History:  Diagnosis  Date   Arthritis    hands   Bladder stone    BPH (benign prostatic hyperplasia)    Cancer ([facility])    skin cancer multiple; followed by [facility]/dermatology yearly.   Colon polyps     Previous polyps on colonoscopy.  [facility].     Hearing aid worn    both ears   Hiatal hernia 10/20/2013   EGD confirmed.  [facility].   History of Barrett's esophagus 2014   pt told by dr Watt Climes no barrett's esophagus 03-03-2021   History of hypertension    no meds since 2019   History of prediabetes    non pre dm since june 2021 weight loss   Hyperlipemia    Neuromuscular disorder ([facility])    numbness rt hand   Seasonal allergies    Sleep apnea    uses CPAP nightly   Past Surgical History:  Procedure Laterality Date   BLADDER STONE REMOVAL  2011, 2018   CARPAL TUNNEL RELEASE  01/17/2012   Procedure: CARPAL TUNNEL RELEASE;  Surgeon: Todd [name]., MD;  Location: [facility];  Service: Orthopedics;  Laterality: Right;   CERVICAL FUSION  1976   COLONOSCOPY  10/20/2013   [facility].  Polyps.    CYSTOSCOPY WITH LITHOLAPAXY N/A 01/09/2017   Procedure: CYSTOSCOPY WITH LITHOLAPAXY;  Surgeon: Todd Gallo, MD;  Location: [facility];  Service: Urology;  Laterality: N/A;   ESOPHAGOGASTRODUODENOSCOPY  10/20/2013   [facility]; biopsy of area concernig for Barrett's; gastritis.   EYE SURGERY  2012   both cataracts  FRACTURE SURGERY Right 1960's   3 fingers   HOLMIUM LASER APPLICATION N/A 06/13/5783   Procedure: HOLMIUM LASER APPLICATION;  Surgeon: Todd Gallo, MD;  Location: [facility];  Service: Urology;  Laterality: N/A;   QUADRICEPS TENDON REPAIR  2003   bilateral   TRANSURETHRAL RESECTION OF PROSTATE N/A 03/08/2021   Procedure: TRANSURETHRAL RESECTION OF THE PROSTATE (TURP);  Surgeon: Todd Gallo, MD;  Location: [facility];  Service: Urology;  Laterality: N/A;   ULNAR NERVE TRANSPOSITION  01/17/2012   Procedure: ULNAR NERVE  DECOMPRESSION/TRANSPOSITION;  Surgeon: Todd [name]., MD;  Location: [facility];  Service: Orthopedics;  Laterality: Right;  decompression of ulnar nerve only  at right cubital tunnel   ULNAR NERVE TRANSPOSITION Left 06/01/2017   Procedure: DECOMPRESSION ULNAR NERVE LEFT CUBITAL TUNNEL;  Surgeon: Todd Cover, MD;  Location: [facility];  Service: Orthopedics;  Laterality: Left;   UPPER GASTROINTESTINAL ENDOSCOPY     VASECTOMY  yrs ago   Allergies  Allergen Reactions   Shellfish Allergy     Headaches, high blood pressure   Ace Inhibitors Cough   Amoxicillin Nausea And Vomiting   Darvon     Chest pain   Toprol Xl [Metoprolol Tartrate]     Not sure reaction   Prior to Admission medications   Medication Sig Start Date End Date Taking? Authorizing Provider  Aspirin-Caffeine (BC FAST PAIN RELIEF ARTHRITIS PO) Take by mouth.   Yes [provider]  Digestive Enzymes (PAPAYA AND ENZYMES PO) Take by mouth.   Yes [provider]  fish oil-omega-3 fatty acids 1000 MG capsule daily. 2000 mg BID   Yes [provider]  GARLIC PO Take 696 mg by mouth 2 (two) times daily. 2 tabs bid per pt   Yes [provider]  Multiple Vitamin (MULTIVITAMIN) tablet Take 1 tablet by mouth daily.   Yes [provider]  pravastatin (PRAVACHOL) 20 MG tablet TAKE 1 TABLET BY MOUTH EVERY DAY CAN START FEW DAYS PER WEEK AND INCREASE IF TOLERATED 04/25/22  Yes Todd Agreste, MD  psyllium (METAMUCIL) 58.6 % powder Take 1 packet by mouth daily.   Yes [provider]  acetaminophen (TYLENOL) 500 MG tablet Take 1,000 mg by mouth every 6 (six) hours as needed. Patient not taking: Reported on 05/19/2022    [provider]  diclofenac Sodium (VOLTAREN) 1 % GEL Apply 4 g topically 4 (four) times daily. Patient not taking: Reported on 05/19/2022 04/20/22   Todd Agreste, MD   Social History   Socioeconomic History   Marital  status: Married    Spouse name: Not on file   Number of children: 2   Years of education: 81   Highest education level: Not on file  Occupational History   Occupation: Retired  Tobacco Use   Smoking status: Former    Packs/day: 2.00    Years: 15.00    Total pack years: 30.00    Types: Cigarettes    Quit date: 01/11/1974    Years since quitting: 48.3   Smokeless tobacco: Never  Vaping Use   Vaping Use: Never used  Substance and Sexual Activity   Alcohol use: Yes    Alcohol/week: 1.0 standard drink of alcohol    Types: 1 Standard drinks or equivalent per week    Comment: occ   Drug use: No   Sexual activity: Not Currently  Other Topics Concern   Not on file  Social History  Narrative   Marital status:  Married x 42 years      Children: 2 daughters; 3 grandchildren.      Employment; retired in 1998 from [employer]; retired age 65 from [employer] after 14 years.      Lives: with wife.      Tobacco: former smoker; quit at age 50.  Smoked 15 years.      Alcohol:  On vacation or special occasions.        Education: [employer].       Exercise: Gym 3-4 times a week for 2 hours.; exercises 7 days per week.   Elliptical for one hour.      Advanced Directives: none; +desires FULL CODE.        ADLs: independent with ADLs; drives.  Does not walk with assistant devices.     Drinks 3-4 caffeine drinks a day    Social Determinants of Health   Financial Resource Strain: Low Risk  (10/14/2021)   Overall Financial Resource Strain (CARDIA)    Difficulty of Paying Living Expenses: Not hard at all  Food Insecurity: No Food Insecurity (10/14/2021)   Hunger Vital Sign    Worried About Running Out of Food in the Last Year: Never true    Ran Out of Food in the Last Year: Never true  Transportation Needs: No Transportation Needs (10/14/2021)   PRAPARE - Hydrologist (Medical): No    Lack of Transportation (Non-Medical): No  Physical Activity:  Sufficiently Active (10/14/2021)   Exercise Vital Sign    Days of Exercise per Week: 4 days    Minutes of Exercise per Session: 40 min  Stress: No Stress Concern Present (10/14/2021)   [place]    Feeling of Stress : Not at all  Social Connections: [place] (10/14/2021)   Social Connection and Isolation Panel [NHANES]    Frequency of Communication with Friends and Family: Twice a week    Frequency of Social Gatherings with Friends and Family: Twice a week    Attends Religious Services: More than 4 times per year    Active Member of [employer] or Organizations: Yes    Attends Archivist Meetings: 1 to 4 times per year    Marital Status: Married  Human resources officer Violence: Not At Risk (10/14/2021)   Humiliation, Afraid, Rape, and Kick questionnaire    Fear of Current or Ex-Partner: No    Emotionally Abused: No    Physically Abused: No    Sexually Abused: No    Review of Systems  Per HPI.  Objective:   Vitals:   05/19/22 1314  BP: 128/70  Pulse: 73  Resp: 16  Temp: (!) 97.5 F (36.4 C)  TempSrc: Oral  SpO2: 98%  Weight: 210 lb (95.3 kg)  Height: '5\' 11"'$  (1.803 m)     Physical Exam Constitutional:      General: He is not in acute distress.    Appearance: Normal appearance. He is well-developed.  HENT:     Head: Normocephalic and atraumatic.  Cardiovascular:     Rate and Rhythm: Normal rate.  Pulmonary:     Effort: Pulmonary effort is normal.  Musculoskeletal:     Comments: Right knee, skin intact, no erythema, no appreciable effusion.  No focal bony tenderness.  Intact extension, limited by approximately 10 to 15 degrees of flexion compared to contralateral knee.  Discomfort in the posterior knee with terminal  flexion and [place]ray, some crepitus.  Negative varus/valgus.  Negative patellar grind.  Skin:    General: Skin is warm and dry.     Findings: No erythema or rash.  Neurological:      Mental Status: He is alert and oriented to person, place, and time.  Psychiatric:        Mood and Affect: Mood normal.        Assessment & Plan:  JEREMAINE MARAJ is a 81 y.o. male . Knee stiffness, right - Plan: Ambulatory referral to Orthopedic Surgery  Right knee pain, unspecified chronicity - Plan: Ambulatory referral to Orthopedic Surgery  Osteoarthritis of right knee, unspecified osteoarthritis type - Plan: Ambulatory referral to Orthopedic Surgery  Tricompartmental arthritis on imaging.  Symptoms have improved with activity modification.  Very active individual.  Will refer back to his orthopedist to decide on steroid injection versus viscosupplementation to potentially buy him some time prior to need for joint replacement.  Continue Voltaren as needed for now.  No orders of the defined types were placed in this encounter.  Patient Instructions  Glad to hear your knee pain is improved.  I will refer you to your orthopedist to decide on injections or other treatments.  Let me know if there are questions.  Okay to use the Voltaren gel if that was helpful. Return to the clinic or go to the nearest emergency room if any of your symptoms worsen or new symptoms occur.     Signed,   Merri Ray, MD [facility] 05/19/22 2:02 PM

## 2022-05-19 NOTE — Patient Instructions (Signed)
Glad to hear your knee pain is improved.  I will refer you to your orthopedist to decide on injections or other treatments.  Let me know if there are questions.  Okay to use the Voltaren gel if that was helpful. Return to the clinic or go to the nearest emergency room if any of your symptoms worsen or new symptoms occur.

## 2022-06-02 ENCOUNTER — Telehealth: Payer: Self-pay | Admitting: Family Medicine

## 2022-06-02 NOTE — Telephone Encounter (Signed)
Caller name: Todd [name] (pt)  On DPR? :yes/no: Yes  Call back number: [phone]  Provider they see: Dr. Carlota Raspberry  Reason for call: Pt calling re: orthopaedic referral.  He is wanting his x-rays sent there.  Is that something we can just send or does he need to come pick those up or do I send him to medical records? Please advise.

## 2022-06-03 NOTE — Telephone Encounter (Signed)
[facility] should be able to pull those up, but can send copy of the report to their office.  Thanks

## 2022-09-12 ENCOUNTER — Encounter: Payer: Self-pay | Admitting: Family Medicine

## 2022-09-12 ENCOUNTER — Ambulatory Visit (INDEPENDENT_AMBULATORY_CARE_PROVIDER_SITE_OTHER): Payer: Medicare Other | Admitting: Family Medicine

## 2022-09-12 VITALS — BP 138/80 | HR 74 | Temp 98.4°F | Ht 71.0 in | Wt 213.2 lb

## 2022-09-12 DIAGNOSIS — E782 Mixed hyperlipidemia: Secondary | ICD-10-CM | POA: Diagnosis not present

## 2022-09-12 DIAGNOSIS — R7303 Prediabetes: Secondary | ICD-10-CM

## 2022-09-12 DIAGNOSIS — R030 Elevated blood-pressure reading, without diagnosis of hypertension: Secondary | ICD-10-CM

## 2022-09-12 LAB — LIPID PANEL
Cholesterol: 148 mg/dL (ref 0–200)
HDL: 48.8 mg/dL (ref >39.00)
LDL Cholesterol: 75 mg/dL (ref 0–99)
NonHDL: 99.13
Total CHOL/HDL Ratio: 3
Triglycerides: 123 mg/dL (ref 0.0–149.0)
VLDL: 24.6 mg/dL (ref 0.0–40.0)

## 2022-09-12 LAB — COMPREHENSIVE METABOLIC PANEL
ALT: 29 U/L (ref 0–53)
AST: 31 U/L (ref 0–37)
Albumin: 4.6 g/dL (ref 3.5–5.2)
Alkaline Phosphatase: 85 U/L (ref 39–117)
BUN: 14 mg/dL (ref 6–23)
CO2: 30 mEq/L (ref 19–32)
Calcium: 9.5 mg/dL (ref 8.4–10.5)
Chloride: 99 mEq/L (ref 96–112)
Creatinine, Ser: 0.94 mg/dL (ref 0.40–1.50)
GFR: 76.96 mL/min (ref >60.00)
Glucose, Bld: 111 mg/dL — ABNORMAL HIGH (ref 70–99)
Potassium: 3.9 mEq/L (ref 3.5–5.1)
Sodium: 136 mEq/L (ref 135–145)
Total Bilirubin: 0.9 mg/dL (ref 0.2–1.2)
Total Protein: 7 g/dL (ref 6.0–8.3)

## 2022-09-12 LAB — HEMOGLOBIN A1C: Hgb A1c MFr Bld: 6 % (ref 4.6–6.5)

## 2022-09-12 NOTE — Patient Instructions (Addendum)
Blood pressure higher, but not at level to start meds yet. Watch diet and try to avoid higher sodium foods on handout. Recheck in 1 month.  Return to the clinic or go to the nearest emergency room if any of your symptoms worsen or new symptoms occur.  How to Take Your Blood Pressure Blood pressure is a measurement of how strongly your blood is pressing against the walls of your arteries. Arteries are blood vessels that carry blood from your heart throughout your body. Your health care provider takes your blood pressure at each office visit. You can also take your own blood pressure at home with a blood pressure monitor. You may need to take your own blood pressure to: Confirm a diagnosis of high blood pressure (hypertension). Monitor your blood pressure over time. Make sure your blood pressure medicine is working. Supplies needed: Blood pressure monitor. A chair to sit in. This should be a chair where you can sit upright with your back supported. Do not sit on a soft couch or an armchair. Table or desk. Small notebook and pencil or pen. How to prepare To get the most accurate reading, avoid the following for 30 minutes before you check your blood pressure: Drinking caffeine. Drinking alcohol. Eating. Smoking. Exercising. Five minutes before you check your blood pressure: Use the bathroom and urinate so that you have an empty bladder. Sit quietly in a chair. Do not talk. How to take your blood pressure To check your blood pressure, follow the instructions in the manual that came with your blood pressure monitor. If you have a digital blood pressure monitor, the instructions may be as follows: Sit up straight in a chair. Place your feet on the floor. Do not cross your ankles or legs. Rest your left arm at the level of your heart on a table or desk or on the arm of a chair. Pull up your shirt sleeve. Wrap the blood pressure cuff around the upper part of your left arm, 1 inch (2.5 cm) above  your elbow. It is best to wrap the cuff around bare skin. Fit the cuff snugly, but not too tightly, around your arm. You should be able to place only one finger between the cuff and your arm. Position the cord so that it rests in the bend of your elbow. Press the power button. Sit quietly while the cuff inflates and deflates. Read the digital reading on the monitor screen and write the numbers down (record them) in a notebook. Wait 2-3 minutes, then repeat the steps, starting at step 1. What does my blood pressure reading mean? A blood pressure reading consists of a higher number over a lower number. Ideally, your blood pressure should be below 120/80. The first ("top") number is called the systolic pressure. It is a measure of the pressure in your arteries as your heart beats. The second ("bottom") number is called the diastolic pressure. It is a measure of the pressure in your arteries as the heart relaxes. Blood pressure is classified into four stages. The following are the stages for adults who do not have a short-term serious illness or a chronic condition. Systolic pressure and diastolic pressure are measured in a unit called mm Hg (millimeters of mercury).  Normal Systolic pressure: below 921. Diastolic pressure: below 80. Elevated Systolic pressure: 194-174. Diastolic pressure: below 80. Hypertension stage 1 Systolic pressure: 081-448. Diastolic pressure: 18-56. Hypertension stage 2 Systolic pressure: 314 or above. Diastolic pressure: 90 or above. You can have elevated blood  pressure or hypertension even if only the systolic or only the diastolic number in your reading is higher than normal. Follow these instructions at home: Medicines Take over-the-counter and prescription medicines only as told by your health care provider. Tell your health care provider if you are having any side effects from blood pressure medicine. General instructions Check your blood pressure as often as  recommended by your health care provider. Check your blood pressure at the same time every day. Take your monitor to the next appointment with your health care provider to make sure that: You are using it correctly. It provides accurate readings. Understand what your goal blood pressure numbers are. Keep all follow-up visits. This is important. General tips Your health care provider can suggest a reliable monitor that will meet your needs. There are several types of home blood pressure monitors. Choose a monitor that has an arm cuff. Do not choose a monitor that measures your blood pressure from your wrist or finger. Choose a cuff that wraps snugly, not too tight or too loose, around your upper arm. You should be able to fit only one finger between your arm and the cuff. You can buy a blood pressure monitor at most drugstores or online. Where to find more information American Heart Association: www.heart.org Contact a health care provider if: Your blood pressure is consistently high. Your blood pressure is suddenly low. Get help right away if: Your systolic blood pressure is higher than 180. Your diastolic blood pressure is higher than 120. These symptoms may be an emergency. Get help right away. Call 911. Do not wait to see if the symptoms will go away. Do not drive yourself to the hospital. Summary Blood pressure is a measurement of how strongly your blood is pressing against the walls of your arteries. A blood pressure reading consists of a higher number over a lower number. Ideally, your blood pressure should be below 120/80. Check your blood pressure at the same time every day. Avoid caffeine, alcohol, smoking, and exercise for 30 minutes prior to checking your blood pressure. These agents can affect the accuracy of the blood pressure reading. This information is not intended to replace advice given to you by your health care provider. Make sure you discuss any questions you have  with your health care provider. Document Revised: 07/08/2021 Document Reviewed: 07/08/2021 Elsevier Patient Education  [place].

## 2022-09-12 NOTE — Progress Notes (Signed)
Subjective:  Patient ID: Todd [name], male    DOB: Mar 17, 1941  Age: 81 y.o. MRN: [id number]  CC:  Chief Complaint  Patient presents with   Hypertension    Pt states he has had an increase in BP    HPI Todd [name] presents for   Elevated blood pressures Normally 117/60-80.  Last 2 weeks  - up to 131/80, 135/84, 142/85.  No new symptoms, some restaurant food, but no added salt to foods at home.  Has taken 2 new herbals to help with joints - collagen for skin and turmeric.  Had knee injections few months ago.  BP Readings from Last 3 Encounters:  09/12/22 138/80  05/19/22 128/70  04/20/22 134/74   Lab Results  Component Value Date   CREATININE 0.94 10/18/2021   Prediabetes:  Lab Results  Component Value Date   HGBA1C 5.8 10/18/2021   Wt Readings from Last 3 Encounters:  09/12/22 213 lb 3.2 oz (96.7 kg)  05/19/22 210 lb (95.3 kg)  04/20/22 209 lb (94.8 kg)    Hyperlipidemia: Pravastatin 20 mg daily, no new side effects or myalgias.  Lab Results  Component Value Date   CHOL 154 10/18/2021   HDL 51.50 10/18/2021   LDLCALC 78 10/18/2021   TRIG 126.0 10/18/2021   CHOLHDL 3 10/18/2021   Lab Results  Component Value Date   ALT 18 10/18/2021   AST 24 10/18/2021   ALKPHOS 89 10/18/2021   BILITOT 1.1 10/18/2021     History Patient Active Problem List   Diagnosis Date Noted   Enlarged prostate with urinary obstruction 03/08/2021   Prediabetes 03/07/2019   Bladder stone 03/20/2017   Nephrolithiasis 03/20/2017   Sensorineural hearing loss (SNHL) of both ears 03/20/2017   OSA on CPAP 02/21/2017   Primary osteoarthritis of both hands 03/01/2016   Mixed hyperlipidemia 03/01/2016   Seasonal allergic rhinitis due to pollen 03/01/2016   BMI 31.0-31.9,adult 03/01/2016   Obesity 03/01/2016   Angioedema 02/16/2013   Past Medical History:  Diagnosis Date   Arthritis    hands   Bladder stone    BPH (benign prostatic hyperplasia)    Cancer ([place])    skin  cancer multiple; followed by [facility]/dermatology yearly.   Colon polyps     Previous polyps on colonoscopy.  [facility].     Hearing aid worn    both ears   Hiatal hernia 10/20/2013   EGD confirmed.  [facility].   History of Barrett's esophagus 2014   pt told by dr Watt Climes no barrett's esophagus 03-03-2021   History of hypertension    no meds since 2019   History of prediabetes    non pre dm since june 2021 weight loss   Hyperlipemia    Neuromuscular disorder ([facility])    numbness rt hand   Seasonal allergies    Sleep apnea    uses CPAP nightly   Past Surgical History:  Procedure Laterality Date   BLADDER STONE REMOVAL  2011, 2018   CARPAL TUNNEL RELEASE  01/17/2012   Procedure: CARPAL TUNNEL RELEASE;  Surgeon: Cammie Sickle., MD;  Location: [facility];  Service: Orthopedics;  Laterality: Right;   CERVICAL FUSION  1976   COLONOSCOPY  10/20/2013   [facility].  Polyps.    CYSTOSCOPY WITH LITHOLAPAXY N/A 01/09/2017   Procedure: CYSTOSCOPY WITH LITHOLAPAXY;  Surgeon: Franchot Gallo, MD;  Location: [facility];  Service: Urology;  Laterality: N/A;   ESOPHAGOGASTRODUODENOSCOPY  10/20/2013   [facility];  biopsy of area concernig for Barrett's; gastritis.   EYE SURGERY  2012   both cataracts   FRACTURE SURGERY Right 1960's   3 fingers   HOLMIUM LASER APPLICATION N/A 03/07/257   Procedure: HOLMIUM LASER APPLICATION;  Surgeon: Franchot Gallo, MD;  Location: [facility];  Service: Urology;  Laterality: N/A;   QUADRICEPS TENDON REPAIR  2003   bilateral   TRANSURETHRAL RESECTION OF PROSTATE N/A 03/08/2021   Procedure: TRANSURETHRAL RESECTION OF THE PROSTATE (TURP);  Surgeon: Franchot Gallo, MD;  Location: [facility];  Service: Urology;  Laterality: N/A;   ULNAR NERVE TRANSPOSITION  01/17/2012   Procedure: ULNAR NERVE DECOMPRESSION/TRANSPOSITION;  Surgeon: Cammie Sickle., MD;  Location: [facility];  Service: Orthopedics;  Laterality:  Right;  decompression of ulnar nerve only  at right cubital tunnel   ULNAR NERVE TRANSPOSITION Left 06/01/2017   Procedure: DECOMPRESSION ULNAR NERVE LEFT CUBITAL TUNNEL;  Surgeon: Leanora Cover, MD;  Location: [facility];  Service: Orthopedics;  Laterality: Left;   UPPER GASTROINTESTINAL ENDOSCOPY     VASECTOMY  yrs ago   Allergies  Allergen Reactions   Shellfish Allergy     Headaches, high blood pressure   Ace Inhibitors Cough   Amoxicillin Nausea And Vomiting   [place]     Chest pain   Toprol Xl [Metoprolol Tartrate]     Not sure reaction   Prior to Admission medications   Medication Sig Start Date End Date Taking? Authorizing Provider  Aspirin-Caffeine (BC FAST PAIN RELIEF ARTHRITIS PO) Take by mouth.   Yes [provider]  Black Pepper-Turmeric (TURMERIC CURCUMIN) 03-999 MG CAPS Take by mouth.   Yes [provider]  collodion LIQD 2 mLs as needed. Capsule   Yes [provider]  Digestive Enzymes (PAPAYA AND ENZYMES PO) Take by mouth.   Yes [provider]  fish oil-omega-3 fatty acids 1000 MG capsule daily. 2000 mg BID   Yes [provider]  GARLIC PO Take 527 mg by mouth 2 (two) times daily. 2 tabs bid per pt   Yes [provider]  Multiple Vitamin (MULTIVITAMIN) tablet Take 1 tablet by mouth daily.   Yes [provider]  pravastatin (PRAVACHOL) 20 MG tablet TAKE 1 TABLET BY MOUTH EVERY DAY CAN START FEW DAYS PER WEEK AND INCREASE IF TOLERATED 04/25/22  Yes Wendie Agreste, MD  psyllium (METAMUCIL) 58.6 % powder Take 1 packet by mouth daily.   Yes [provider]  acetaminophen (TYLENOL) 500 MG tablet Take 1,000 mg by mouth every 6 (six) hours as needed. Patient not taking: Reported on 09/12/2022    [provider]  diclofenac Sodium (VOLTAREN) 1 % GEL Apply 4 g topically 4 (four) times daily. Patient not taking: Reported on 05/19/2022 04/20/22   Wendie Agreste, MD   Social History    Socioeconomic History   Marital status: Married    Spouse name: Not on file   Number of children: 2   Years of education: 76   Highest education level: Not on file  Occupational History   Occupation: Retired  Tobacco Use   Smoking status: Former    Packs/day: 2.00    Years: 15.00    Total pack years: 30.00    Types: Cigarettes    Quit date: 01/11/1974    Years since quitting: 48.7   Smokeless tobacco: Never  Vaping Use   Vaping Use: Never used  Substance and Sexual Activity   Alcohol  use: Yes    Alcohol/week: 1.0 standard drink of alcohol    Types: 1 Standard drinks or equivalent per week    Comment: occ   Drug use: No   Sexual activity: Not Currently  Other Topics Concern   Not on file  Social History Narrative   Marital status:  Married x 42 years      Children: 2 daughters; 3 grandchildren.      Employment; retired in 1998 from [employer]; retired age 48 from [employer] after 14 years.      Lives: with wife.      Tobacco: former smoker; quit at age 45.  Smoked 15 years.      Alcohol:  On vacation or special occasions.        Education: [employer].       Exercise: Gym 3-4 times a week for 2 hours.; exercises 7 days per week.   Elliptical for one hour.      Advanced Directives: none; +desires FULL CODE.        ADLs: independent with ADLs; drives.  Does not walk with assistant devices.     Drinks 3-4 caffeine drinks a day    Social Determinants of Health   Financial Resource Strain: Low Risk  (10/14/2021)   Overall Financial Resource Strain (CARDIA)    Difficulty of Paying Living Expenses: Not hard at all  Food Insecurity: No Food Insecurity (10/14/2021)   Hunger Vital Sign    Worried About Running Out of Food in the Last Year: Never true    Ran Out of Food in the Last Year: Never true  Transportation Needs: No Transportation Needs (10/14/2021)   PRAPARE - Hydrologist (Medical): No    Lack of Transportation  (Non-Medical): No  Physical Activity: Sufficiently Active (10/14/2021)   Exercise Vital Sign    Days of Exercise per Week: 4 days    Minutes of Exercise per Session: 40 min  Stress: No Stress Concern Present (10/14/2021)   [place]    Feeling of Stress : Not at all  Social Connections: [place] (10/14/2021)   Social Connection and Isolation Panel [NHANES]    Frequency of Communication with Friends and Family: Twice a week    Frequency of Social Gatherings with Friends and Family: Twice a week    Attends Religious Services: More than 4 times per year    Active Member of [employer] or Organizations: Yes    Attends Archivist Meetings: 1 to 4 times per year    Marital Status: Married  Human resources officer Violence: Not At Risk (10/14/2021)   Humiliation, Afraid, Rape, and Kick questionnaire    Fear of Current or Ex-Partner: No    Emotionally Abused: No    Physically Abused: No    Sexually Abused: No    Review of Systems  Constitutional:  Negative for fatigue and unexpected weight change.  Eyes:  Negative for visual disturbance.  Respiratory:  Negative for cough, chest tightness and shortness of breath.   Cardiovascular:  Negative for chest pain, palpitations and leg swelling.  Gastrointestinal:  Negative for abdominal pain and blood in stool.  Neurological:  Negative for dizziness, light-headedness and headaches.     Objective:   Vitals:   09/12/22 0903  BP: 138/80  Pulse: 74  Temp: 98.4 F (36.9 C)  SpO2: 97%  Weight: 213 lb 3.2 oz (96.7 kg)  Height: '5\' 11"'$  (1.803  m)     Physical Exam Vitals reviewed.  Constitutional:      Appearance: He is well-developed.  HENT:     Head: Normocephalic and atraumatic.  Neck:     Vascular: No carotid bruit or JVD.  Cardiovascular:     Rate and Rhythm: Normal rate and regular rhythm.     Heart sounds: Normal heart sounds. No murmur heard. Pulmonary:      Effort: Pulmonary effort is normal.     Breath sounds: Normal breath sounds. No rales.  Musculoskeletal:     Right lower leg: No edema.     Left lower leg: No edema.  Skin:    General: Skin is warm and dry.  Neurological:     Mental Status: He is alert and oriented to person, place, and time.  Psychiatric:        Mood and Affect: Mood normal.        Assessment & Plan:  Todd [name] is a 81 y.o. male . Elevated blood pressure reading - Plan: Comprehensive metabolic panel  -Mild elevation, asymptomatic.  Check updated labs including creatinine.  Handout given on salty 6 with foods to avoid temporarily, handout given on checking blood pressure with 1 month follow-up planned.  Hold on new meds for now.  RTC/ER precautions given  Prediabetes - Plan: Hemoglobin A1c  -Check labs.  Follow-up in 1 month to discuss further  Mixed hyperlipidemia - Plan: Comprehensive metabolic panel, Lipid panel  -Tolerating current regimen, check labs, discuss further at 1 month follow-up.  No med changes at this time  No orders of the defined types were placed in this encounter.  Patient Instructions  Blood pressure higher, but not at level to start meds yet. Watch diet and try to avoid higher sodium foods on handout. Recheck in 1 month.  Return to the clinic or go to the nearest emergency room if any of your symptoms worsen or new symptoms occur.  How to Take Your Blood Pressure Blood pressure is a measurement of how strongly your blood is pressing against the walls of your arteries. Arteries are blood vessels that carry blood from your heart throughout your body. Your health care provider takes your blood pressure at each office visit. You can also take your own blood pressure at home with a blood pressure monitor. You may need to take your own blood pressure to: Confirm a diagnosis of high blood pressure (hypertension). Monitor your blood pressure over time. Make sure your blood pressure medicine is  working. Supplies needed: Blood pressure monitor. A chair to sit in. This should be a chair where you can sit upright with your back supported. Do not sit on a soft couch or an armchair. Table or desk. Small notebook and pencil or pen. How to prepare To get the most accurate reading, avoid the following for 30 minutes before you check your blood pressure: Drinking caffeine. Drinking alcohol. Eating. Smoking. Exercising. Five minutes before you check your blood pressure: Use the bathroom and urinate so that you have an empty bladder. Sit quietly in a chair. Do not talk. How to take your blood pressure To check your blood pressure, follow the instructions in the manual that came with your blood pressure monitor. If you have a digital blood pressure monitor, the instructions may be as follows: Sit up straight in a chair. Place your feet on the floor. Do not cross your ankles or legs. Rest your left arm at the level of your heart on  a table or desk or on the arm of a chair. Pull up your shirt sleeve. Wrap the blood pressure cuff around the upper part of your left arm, 1 inch (2.5 cm) above your elbow. It is best to wrap the cuff around bare skin. Fit the cuff snugly, but not too tightly, around your arm. You should be able to place only one finger between the cuff and your arm. Position the cord so that it rests in the bend of your elbow. Press the power button. Sit quietly while the cuff inflates and deflates. Read the digital reading on the monitor screen and write the numbers down (record them) in a notebook. Wait 2-3 minutes, then repeat the steps, starting at step 1. What does my blood pressure reading mean? A blood pressure reading consists of a higher number over a lower number. Ideally, your blood pressure should be below 120/80. The first ("top") number is called the systolic pressure. It is a measure of the pressure in your arteries as your heart beats. The second ("bottom")  number is called the diastolic pressure. It is a measure of the pressure in your arteries as the heart relaxes. Blood pressure is classified into four stages. The following are the stages for adults who do not have a short-term serious illness or a chronic condition. Systolic pressure and diastolic pressure are measured in a unit called mm Hg (millimeters of mercury).  Normal Systolic pressure: below 035. Diastolic pressure: below 80. Elevated Systolic pressure: 465-681. Diastolic pressure: below 80. Hypertension stage 1 Systolic pressure: 275-170. Diastolic pressure: 01-74. Hypertension stage 2 Systolic pressure: 944 or above. Diastolic pressure: 90 or above. You can have elevated blood pressure or hypertension even if only the systolic or only the diastolic number in your reading is higher than normal. Follow these instructions at home: Medicines Take over-the-counter and prescription medicines only as told by your health care provider. Tell your health care provider if you are having any side effects from blood pressure medicine. General instructions Check your blood pressure as often as recommended by your health care provider. Check your blood pressure at the same time every day. Take your monitor to the next appointment with your health care provider to make sure that: You are using it correctly. It provides accurate readings. Understand what your goal blood pressure numbers are. Keep all follow-up visits. This is important. General tips Your health care provider can suggest a reliable monitor that will meet your needs. There are several types of home blood pressure monitors. Choose a monitor that has an arm cuff. Do not choose a monitor that measures your blood pressure from your wrist or finger. Choose a cuff that wraps snugly, not too tight or too loose, around your upper arm. You should be able to fit only one finger between your arm and the cuff. You can buy a blood pressure  monitor at most drugstores or online. Where to find more information American Heart Association: www.heart.org Contact a health care provider if: Your blood pressure is consistently high. Your blood pressure is suddenly low. Get help right away if: Your systolic blood pressure is higher than 180. Your diastolic blood pressure is higher than 120. These symptoms may be an emergency. Get help right away. Call 911. Do not wait to see if the symptoms will go away. Do not drive yourself to the hospital. Summary Blood pressure is a measurement of how strongly your blood is pressing against the walls of your arteries. A blood pressure  reading consists of a higher number over a lower number. Ideally, your blood pressure should be below 120/80. Check your blood pressure at the same time every day. Avoid caffeine, alcohol, smoking, and exercise for 30 minutes prior to checking your blood pressure. These agents can affect the accuracy of the blood pressure reading. This information is not intended to replace advice given to you by your health care provider. Make sure you discuss any questions you have with your health care provider. Document Revised: 07/08/2021 Document Reviewed: 07/08/2021 Elsevier Patient Education  2023 [place],   Merri Ray, MD [place], [facility] 09/12/22 9:48 AM

## 2022-10-17 ENCOUNTER — Other Ambulatory Visit: Payer: Self-pay | Admitting: Family Medicine

## 2022-10-17 ENCOUNTER — Encounter: Payer: Self-pay | Admitting: Family Medicine

## 2022-10-17 ENCOUNTER — Ambulatory Visit (INDEPENDENT_AMBULATORY_CARE_PROVIDER_SITE_OTHER): Payer: Medicare Other | Admitting: Family Medicine

## 2022-10-17 VITALS — BP 138/76 | HR 67 | Temp 98.1°F | Ht 71.0 in | Wt 213.8 lb

## 2022-10-17 DIAGNOSIS — R7303 Prediabetes: Secondary | ICD-10-CM | POA: Diagnosis not present

## 2022-10-17 DIAGNOSIS — E785 Hyperlipidemia, unspecified: Secondary | ICD-10-CM

## 2022-10-17 DIAGNOSIS — R030 Elevated blood-pressure reading, without diagnosis of hypertension: Secondary | ICD-10-CM | POA: Diagnosis not present

## 2022-10-17 NOTE — Progress Notes (Signed)
Subjective:  Patient ID: Todd [name], male    DOB: 1941/08/23  Age: 81 y.o. MRN: [id number]  CC:  Chief Complaint  Patient presents with   Hyperlipidemia   Hypertension    Brought his monitor to check today against our readings     HPI Todd [name] presents for   Elevated blood pressure: Follow-up from November 6 visit. Some elevated readings noted at that time.  130s to 140 range.  Knee injection few months prior, no change in foods.  He is not on antihypertensives. Handout given on common high sodium foods to avoid.  Home monitoring of blood pressure, his readings have been 120s to 130s primarily.  Low of 114/74, high of 138/76.  His machine was compared to ours in the office, 144/87 on the left versus 136/80 with clinic BP meter.  142/85 on the right versus 138/76 with clinic BP meter. Labs overall reassuring last visit, glucose 111, creatinine 0.94.  A1c 6.0.  History of prediabetes.  Lipid panel was stable. No diet changes since last visit. Still no added salt.  Some ear pressure and occasional dizziness with weather change only. No pain. No change in hearing. Popping ears helps and warmth to ear.   BP Readings from Last 3 Encounters:  10/17/22 138/76  09/12/22 138/80  05/19/22 128/70   Lab Results  Component Value Date   CREATININE 0.94 09/12/2022       History Patient Active Problem List   Diagnosis Date Noted   Enlarged prostate with urinary obstruction 03/08/2021   Prediabetes 03/07/2019   Bladder stone 03/20/2017   Nephrolithiasis 03/20/2017   Sensorineural hearing loss (SNHL) of both ears 03/20/2017   OSA on CPAP 02/21/2017   Primary osteoarthritis of both hands 03/01/2016   Mixed hyperlipidemia 03/01/2016   Seasonal allergic rhinitis due to pollen 03/01/2016   BMI 31.0-31.9,adult 03/01/2016   Obesity 03/01/2016   Angioedema 02/16/2013   Past Medical History:  Diagnosis Date   Arthritis    hands   Bladder stone    BPH (benign prostatic hyperplasia)     Cancer ([place])    skin cancer multiple; followed by [facility]/dermatology yearly.   Colon polyps     Previous polyps on colonoscopy.  Magod.     Hearing aid worn    both ears   Hiatal hernia 10/20/2013   EGD confirmed.  [facility].   History of Barrett's esophagus 2014   pt told by dr Watt Climes no barrett's esophagus 03-03-2021   History of hypertension    no meds since 2019   History of prediabetes    non pre dm since june 2021 weight loss   Hyperlipemia    Neuromuscular disorder (HCC)    numbness rt hand   Seasonal allergies    Sleep apnea    uses CPAP nightly   Past Surgical History:  Procedure Laterality Date   BLADDER STONE REMOVAL  2011, 2018   CARPAL TUNNEL RELEASE  01/17/2012   Procedure: CARPAL TUNNEL RELEASE;  Surgeon: Cammie Sickle., MD;  Location: [facility];  Service: Orthopedics;  Laterality: Right;   CERVICAL FUSION  1976   COLONOSCOPY  10/20/2013   [facility].  Polyps.    CYSTOSCOPY WITH LITHOLAPAXY N/A 01/09/2017   Procedure: CYSTOSCOPY WITH LITHOLAPAXY;  Surgeon: Franchot Gallo, MD;  Location: [facility];  Service: Urology;  Laterality: N/A;   ESOPHAGOGASTRODUODENOSCOPY  10/20/2013   HH; biopsy of area concernig for Barrett's; gastritis.   EYE SURGERY  2012   both cataracts   FRACTURE SURGERY Right 1960's   3 fingers   HOLMIUM LASER APPLICATION N/A 05/12/7340   Procedure: HOLMIUM LASER APPLICATION;  Surgeon: Franchot Gallo, MD;  Location[facility];  Service: Urology;  Laterality: N/A;   QUADRICEPS TENDON REPAIR  2003   bilateral   TRANSURETHRAL RESECTION OF PROSTATE N/A 03/08/2021   Procedure: TRANSURETHRAL RESECTION OF THE PROSTATE (TURP);  Surgeon: Franchot Gallo, MD;  Location: [facility];  Service: Urology;  Laterality: N/A;   ULNAR NERVE TRANSPOSITION  01/17/2012   Procedure: ULNAR NERVE DECOMPRESSION/TRANSPOSITION;  Surgeon: Cammie Sickle., MD;  Location: [facility];  Service:  Orthopedics;  Laterality: Right;  decompression of ulnar nerve only  at right cubital tunnel   ULNAR NERVE TRANSPOSITION Left 06/01/2017   Procedure: DECOMPRESSION ULNAR NERVE LEFT CUBITAL TUNNEL;  Surgeon: Leanora Cover, MD;  Location: [place];  Service: Orthopedics;  Laterality: Left;   UPPER GASTROINTESTINAL ENDOSCOPY     VASECTOMY  yrs ago   Allergies  Allergen Reactions   Shellfish Allergy     Headaches, high blood pressure   Ace Inhibitors Cough   Amoxicillin Nausea And Vomiting   Darvon     Chest pain   Toprol Xl [Metoprolol Tartrate]     Not sure reaction   Prior to Admission medications   Medication Sig Start Date End Date Taking? Authorizing Provider  Aspirin-Caffeine (BC FAST PAIN RELIEF ARTHRITIS PO) Take by mouth.   Yes [provider]  Black Pepper-Turmeric (TURMERIC CURCUMIN) 03-999 MG CAPS Take by mouth.   Yes [provider]  collodion LIQD 2 mLs as needed. Capsule   Yes [provider]  Digestive Enzymes (PAPAYA AND ENZYMES PO) Take by mouth.   Yes [provider]  fish oil-omega-3 fatty acids 1000 MG capsule daily. 2000 mg BID   Yes [provider]  GARLIC PO Take 937 mg by mouth 2 (two) times daily. 2 tabs bid per pt   Yes [provider]  Multiple Vitamin (MULTIVITAMIN) tablet Take 1 tablet by mouth daily.   Yes [provider]  pravastatin (PRAVACHOL) 20 MG tablet TAKE 1 TABLET BY MOUTH EVERY DAY CAN START FEW DAYS PER WEEK AND INCREASE IF TOLERATED 10/17/22  Yes Wendie Agreste, MD  psyllium (METAMUCIL) 58.6 % powder Take 1 packet by mouth daily.   Yes [provider]  acetaminophen (TYLENOL) 500 MG tablet Take 1,000 mg by mouth every 6 (six) hours as needed. Patient not taking: Reported on 09/12/2022    [provider]  diclofenac Sodium (VOLTAREN) 1 % GEL Apply 4 g topically 4 (four) times daily. Patient not taking: Reported on 05/19/2022 04/20/22   Wendie Agreste, MD   Social History   Socioeconomic History   Marital status: Married    Spouse name: Not on file   Number of children: 2   Years of education: 34   Highest education level: Not on file  Occupational History   Occupation: Retired  Tobacco Use   Smoking status: Former    Packs/day: 2.00    Years: 15.00    Total pack years: 30.00    Types: Cigarettes    Quit date: 01/11/1974    Years since quitting: 48.7   Smokeless tobacco: Never  Vaping Use   Vaping Use: Never used  Substance and Sexual Activity   Alcohol use: Yes    Alcohol/week: 1.0 standard drink of alcohol  Types: 1 Standard drinks or equivalent per week    Comment: occ   Drug use: No   Sexual activity: Not Currently  Other Topics Concern   Not on file  Social History Narrative   Marital status:  Married x 42 years      Children: 2 daughters; 3 grandchildren.      Employment; retired in 1998 from [employer]; retired age 17 from [employer] after 14 years.      Lives: with wife.      Tobacco: former smoker; quit at age 78.  Smoked 15 years.      Alcohol:  On vacation or special occasions.        Education: [employer].       Exercise: Gym 3-4 times a week for 2 hours.; exercises 7 days per week.   Elliptical for one hour.      Advanced Directives: none; +desires FULL CODE.        ADLs: independent with ADLs; drives.  Does not walk with assistant devices.     Drinks 3-4 caffeine drinks a day    Social Determinants of Health   Financial Resource Strain: Low Risk  (10/14/2021)   Overall Financial Resource Strain (CARDIA)    Difficulty of Paying Living Expenses: Not hard at all  Food Insecurity: No Food Insecurity (10/14/2021)   Hunger Vital Sign    Worried About Running Out of Food in the Last Year: Never true    Ran Out of Food in the Last Year: Never true  Transportation Needs: No Transportation Needs (10/14/2021)   PRAPARE - Hydrologist (Medical): No    Lack of  Transportation (Non-Medical): No  Physical Activity: Sufficiently Active (10/14/2021)   Exercise Vital Sign    Days of Exercise per Week: 4 days    Minutes of Exercise per Session: 40 min  Stress: No Stress Concern Present (10/14/2021)   [place]    Feeling of Stress : Not at all  Social Connections: [place] (10/14/2021)   Social Connection and Isolation Panel [NHANES]    Frequency of Communication with Friends and Family: Twice a week    Frequency of Social Gatherings with Friends and Family: Twice a week    Attends Religious Services: More than 4 times per year    Active Member of [employer] or Organizations: Yes    Attends Archivist Meetings: 1 to 4 times per year    Marital Status: Married  Human resources officer Violence: Not At Risk (10/14/2021)   Humiliation, Afraid, Rape, and Kick questionnaire    Fear of Current or Ex-Partner: No    Emotionally Abused: No    Physically Abused: No    Sexually Abused: No    Review of Systems  Constitutional:  Negative for fatigue and unexpected weight change.  Eyes:  Negative for visual disturbance.  Respiratory:  Negative for cough, chest tightness and shortness of breath.   Cardiovascular:  Negative for chest pain, palpitations and leg swelling.  Gastrointestinal:  Negative for abdominal pain and blood in stool.  Neurological:  Negative for dizziness, light-headedness and headaches.     Objective:   Vitals:   10/17/22 1043  BP: 138/76  Pulse: 67  Temp: 98.1 F (36.7 C)  TempSrc: Oral  SpO2: 97%  Weight: 213 lb 12.8 oz (97 kg)  Height: '5\' 11"'$  (1.803 m)     Physical Exam Vitals reviewed.  Constitutional:  Appearance: He is well-developed.  HENT:     Head: Normocephalic and atraumatic.  Neck:     Vascular: No carotid bruit or JVD.  Cardiovascular:     Rate and Rhythm: Normal rate and regular rhythm.     Heart sounds: Normal heart sounds. No  murmur heard. Pulmonary:     Effort: Pulmonary effort is normal.     Breath sounds: Normal breath sounds. No rales.  Musculoskeletal:     Right lower leg: No edema.     Left lower leg: No edema.  Skin:    General: Skin is warm and dry.  Neurological:     Mental Status: He is alert and oriented to person, place, and time.  Psychiatric:        Mood and Affect: Mood normal.        Assessment & Plan:  Todd [name] is a 81 y.o. male . Elevated blood pressure reading  Prediabetes  Borderline blood pressure but overall home numbers are reassuring.  May have a component of whitecoat with slight elevated pressure in the office today.  Home meter is running a few points higher, but with that and home readings, will hold on medication.  Labs noted from last visit with overall stable A1c.  Recheck in 5 months.  RTC precautions if elevated readings sooner.  May have component of eustachian tube dysfunction with weather changes but is treating at home with similar approach in the past, no new or worrisome symptoms.  RTC precautions  No orders of the defined types were placed in this encounter.  Patient Instructions  Blood pressure borderline, but not high enough to start a med at this time. If blood pressure in 140 range or above 90 I would consider a medication. Continue to watch for foods that may be high in sodium.   Return to the clinic or go to the nearest emergency room if any of your symptoms worsen or new symptoms occur.     Signed,   Merri Ray, MD [place] 10/17/22 11:21 AM

## 2022-10-17 NOTE — Patient Instructions (Signed)
Blood pressure borderline, but not high enough to start a med at this time. If blood pressure in 140 range or above 90 I would consider a medication. Continue to watch for foods that may be high in sodium.   Return to the clinic or go to the nearest emergency room if any of your symptoms worsen or new symptoms occur.

## 2022-11-14 NOTE — Progress Notes (Unsigned)
PATIENT: Todd [name] DOB: 03-20-1941  REASON FOR VISIT: follow up HISTORY FROM: patient PRIMARY NEUROLOGIST: Dr. Frances Furbish  Chief Complaint  Patient presents with   Follow-up    Pt in 19 Pt here for CPAP f/u Pt states no questions or concerns for today's visit      HISTORY OF PRESENT ILLNESS: Today 11/15/22:  Todd [name] is a 82 year old male with a history of obstructive sleep apnea on CPAP.  He returns today for follow-up.  His download is below.  He denies any issues with the machine.  His residual AHI remains elevated.  In the past he has been on BiPAP therapy but was switched to CPAP.  We tried to put the patient on AutoSet but his machine does not have the capability.  At that time he did not qualify for new machine    11/15/21: Todd [name] is a 82 year old male with a history of obstructive sleep apnea on CPAP.  He returns today for follow-up.  He reports that the CPAP is working well for him.  He denies any new symptoms.  His download is below.  His residual AHI is still elevated.  In the past he has tried higher pressures but he felt that the air was making his stomach feel bloated.  Not sure that he is ever been on AutoPap.     05/04/21: Todd [name] is a 82 year old male with a history of obstructive sleep apnea on CPAP.  He returns today for follow-up.  At his last visit his pressure was set at 8 cmH2O increase to 10 cm of water his residual AHI has decreased from 31 events to 18 events.  The patient states that he is tolerating the pressure well.  He reports that he continues to get up several times a night to urinate his download is below.    HISTORY (copied from Dr. Teofilo Pod note) /12/2020: I reviewed his CPAP compliance data from 12/06/2020 through 01/04/2021, which is a total of 30 days, during which time he used his machine every night with percent use days greater than 4 hours at 100%, indicating superb compliance with average usage of 7 hours and 39 minutes, residual AHI highly  elevated at 31.9/h, residual events appear to be obstructive in nature, pressure at 8 cm, leak acceptable with a 95th percentile at 20.9 L/min. He reports doing well, he tolerates the pressure so much better than the high pressure BiPAP, he eventually gave up using the BiPAP.  He feels like he is sleeping quite well.  He continues to use a full facemask and sometimes with position change he does notice the leak.  He is motivated to continue with treatment.  He was not aware that his AHI score was high.  He does report that when he stopped using his BiPAP and slept without treatment, he slept fairly well, his wife noticed less snoring overall compared to the past.  He had a recent checkup with his primary care, he is doing well.  He tries to stay active, does some resistance training and aerobic exercises, uses a pedaling device at home.  REVIEW OF SYSTEMS: Out of a complete 14 system review of symptoms, the patient complains only of the following symptoms, and all other reviewed systems are negative.  ESS 5  ALLERGIES: Allergies  Allergen Reactions   Shellfish Allergy     Headaches, high blood pressure   Ace Inhibitors Cough   Amoxicillin Nausea And Vomiting   Darvon  Chest pain   Toprol Xl [Metoprolol Tartrate]     Not sure reaction    HOME MEDICATIONS: Outpatient Medications Prior to Visit  Medication Sig Dispense Refill   Aspirin-Caffeine (BC FAST PAIN RELIEF ARTHRITIS PO) Take by mouth.     Black Pepper-Turmeric (TURMERIC CURCUMIN) 03-999 MG CAPS Take by mouth.     collodion LIQD 2 mLs as needed. Capsule     diclofenac Sodium (VOLTAREN) 1 % GEL Apply 4 g topically 4 (four) times daily. (Patient taking differently: Apply 4 g topically as needed.) 150 g 1   Digestive Enzymes (PAPAYA AND ENZYMES PO) Take by mouth.     fish oil-omega-3 fatty acids 1000 MG capsule daily. 2000 mg BID     GARLIC PO Take 147 mg by mouth 2 (two) times daily. 2 tabs bid per pt     Multiple Vitamin  (MULTIVITAMIN) tablet Take 1 tablet by mouth daily.     pravastatin (PRAVACHOL) 20 MG tablet TAKE 1 TABLET BY MOUTH EVERY DAY CAN START FEW DAYS PER WEEK AND INCREASE IF TOLERATED 90 tablet 1   psyllium (METAMUCIL) 58.6 % powder Take 1 packet by mouth daily.     acetaminophen (TYLENOL) 500 MG tablet Take 1,000 mg by mouth every 6 (six) hours as needed.     No facility-administered medications prior to visit.    PAST MEDICAL HISTORY: Past Medical History:  Diagnosis Date   Arthritis    hands   Bladder stone    BPH (benign prostatic hyperplasia)    Cancer (HCC)    skin cancer multiple; followed by [facility]/dermatology yearly.   Colon polyps     Previous polyps on colonoscopy.  Magod.     Hearing aid worn    both ears   Hiatal hernia 10/20/2013   EGD confirmed.  [facility]od.   History of Barrett's esophagus 2014   pt told by dr Ewing Schlein no barrett's esophagus 03-03-2021   History of hypertension    no meds since 2019   History of prediabetes    non pre dm since june 2021 weight loss   Hyperlipemia    Neuromuscular disorder (HCC)    numbness rt hand   Seasonal allergies    Sleep apnea    uses CPAP nightly    PAST SURGICAL HISTORY: Past Surgical History:  Procedure Laterality Date   BLADDER STONE REMOVAL  2011, 2018   CARPAL TUNNEL RELEASE  01/17/2012   Procedure: CARPAL TUNNEL RELEASE;  Surgeon: [name]., MD;  Location: [facility];  Service: Orthopedics;  Laterality: Right;   CERVICAL FUSION  1976   COLONOSCOPY  10/20/2013   [facility].  Polyps.    CYSTOSCOPY WITH LITHOLAPAXY N/A 01/09/2017   Procedure: CYSTOSCOPY WITH LITHOLAPAXY;  Surgeon: Marcine Matar, MD;  Location: [facility];  Service: Urology;  Laterality: N/A;   ESOPHAGOGASTRODUODENOSCOPY  10/20/2013   HH; biopsy of area concernig for Barrett's; gastritis.   EYE SURGERY  2012   both cataracts   FRACTURE SURGERY Right 1960's   3 fingers   HOLMIUM LASER APPLICATION N/A 01/09/2017    Procedure: HOLMIUM LASER APPLICATION;  Surgeon: Marcine Matar, MD;  Location: [facility];  Service: Urology;  Laterality: N/A;   QUADRICEPS TENDON REPAIR  2003   bilateral   TRANSURETHRAL RESECTION OF PROSTATE N/A 03/08/2021   Procedure: TRANSURETHRAL RESECTION OF THE PROSTATE (TURP);  Surgeon: Marcine Matar, MD;  [facility];  Service: Urology;  Laterality: N/A;  ULNAR NERVE TRANSPOSITION  01/17/2012   Procedure: ULNAR NERVE DECOMPRESSION/TRANSPOSITION;  Surgeon: [name]., MD;  Location: [facility];  Service: Orthopedics;  Laterality: Right;  decompression of ulnar nerve only  at right cubital tunnel   ULNAR NERVE TRANSPOSITION Left 06/01/2017   Procedure: DECOMPRESSION ULNAR NERVE LEFT CUBITAL TUNNEL;  Surgeon: Betha Loa, MD;  Location:  SUR[facility]ERY [facility];  Service: Orthopedics;  Laterality: Left;   UPPER GASTROINTESTINAL ENDOSCOPY     VASECTOMY  yrs ago    FAMILY HISTORY: Family History  Problem Relation Age of Onset   Heart failure Mother        in 31s   Cirrhosis Father    Hypertension Father    Alcohol abuse Father    Sleep apnea Father    Arthritis Brother        Rheumatoid arthritis   Hypertension Brother    Neuropathy Neg Hx     SOCIAL HISTORY: Social History   Socioeconomic History   Marital status: Married    Spouse name: Not on file   Number of children: 2   Years of education: 12   Highest education level: Not on file  Occupational History   Occupation: Retired  Tobacco Use   Smoking status: Former    Packs/day: 2.00    Years: 15.00    Total pack years: 30.00    Types: Cigarettes    Quit date: 01/11/1974    Years since quitting: 48.8   Smokeless tobacco: Never  Vaping Use   Vaping Use: Never used  Substance and Sexual Activity   Alcohol use: Yes    Alcohol/week: 1.0 standard drink of alcohol    Types: 1 Standard drinks or equivalent per week    Comment: occ   Drug use:  No   Sexual activity: Not Currently  Other Topics Concern   Not on file  Social History Narrative   Marital status:  Married x 42 years      Children: 2 daughters; 3 grandchildren.      Employment; retired in 1998 from [employer]; retired age 30 from [employer] after 14 years.      Lives: with wife.      Tobacco: former smoker; quit at age 41.  Smoked 15 years.      Alcohol:  On vacation or special occasions.        Education: [employer].       Exercise: Gym 3-4 times a week for 2 hours.; exercises 7 days per week.   Elliptical for one hour.      Advanced Directives: none; +desires FULL CODE.        ADLs: independent with ADLs; drives.  Does not walk with assistant devices.     Drinks 3-4 caffeine drinks a day    Social Determinants of Health   Financial Resource Strain: Low Risk  (10/14/2021)   Overall Financial Resource Strain (CARDIA)    Difficulty of Paying Living Expenses: Not hard at all  Food Insecurity: No Food Insecurity (10/14/2021)   Hunger Vital Sign    Worried About Running Out of Food in the Last Year: Never true    Ran Out of Food in the Last Year: Never true  Transportation Needs: No Transportation Needs (10/14/2021)   PRAPARE - Administrator, Civil Service (Medical): No    Lack of Transportation (Non-Medical): No  Physical Activity: Sufficiently Active (10/14/2021)   Exercise Vital Sign    Days of  Exercise per Week: 4 days    Minutes of Exercise per Session: 40 min  Stress: No Stress Concern Present (10/14/2021)   [employer] of Occupational Health - Occupational Stress Questionnaire    Feeling of Stress : Not at all  Social Connections: Socially Integrated (10/14/2021)   Social Connection and Isolation Panel [NHANES]    Frequency of Communication with Friends and Family: Twice a week    Frequency of Social Gatherings with Friends and Family: Twice a week    Attends Religious Services: More than 4 times per year    Active  Member of [employer] or Organizations: Yes    Attends Banker Meetings: 1 to 4 times per year    Marital Status: Married  Catering manager Violence: Not At Risk (10/14/2021)   Humiliation, Afraid, Rape, and Kick questionnaire    Fear of Current or Ex-Partner: No    Emotionally Abused: No    Physically Abused: No    Sexually Abused: No      PHYSICAL EXAM  Vitals:   11/15/22 0805  BP: 138/73  Pulse: 86   There is no height or weight on file to calculate BMI.  Generalized: Well developed, in no acute distress  Chest: Lungs clear to auscultation bilaterally  Neurological examination  Mentation: Alert orien yeah and this is and is all obstructive and do not really have a leak at all with the mask.  history taking. Follows all commands speech and language fluent Cranial nerve II-XII: Extraocular movements were full, visual field were full on confrontational test Head turning and shoulder shrug  were normal and symmetric.     DIAGNOSTIC DATA (LABS, IMAGING, TESTING) - I reviewed patient records, labs, notes, testing and imaging myself where available.  Lab Results  Component Value Date   WBC 4.3 10/18/2021   HGB 14.8 10/18/2021   HCT 44.2 10/18/2021   MCV 87.2 10/18/2021   PLT 196.0 10/18/2021      Component Value Date/Time   NA 136 09/12/2022 0959   NA 137 10/26/2020 1141   K 3.9 09/12/2022 0959   CL 99 09/12/2022 0959   CO2 30 09/12/2022 0959   GLUCOSE 111 (H) 09/12/2022 0959   BUN 14 09/12/2022 0959   BUN 15 10/26/2020 1141   CREATININE 0.94 09/12/2022 0959   CREATININE 0.89 08/23/2016 1016   CALCIUM 9.5 09/12/2022 0959   PROT 7.0 09/12/2022 0959   PROT 7.2 12/01/2021 0836   ALBUMIN 4.6 09/12/2022 0959   ALBUMIN 4.8 (H) 10/26/2020 1141   AST 31 09/12/2022 0959   ALT 29 09/12/2022 0959   ALKPHOS 85 09/12/2022 0959   BILITOT 0.9 09/12/2022 0959   BILITOT 0.8 10/26/2020 1141   GFRNONAA 79 10/26/2020 1141   GFRAA 91 10/26/2020 1141   Lab Results   Component Value Date   CHOL 148 09/12/2022   HDL 48.80 09/12/2022   LDLCALC 75 09/12/2022   TRIG 123.0 09/12/2022   CHOLHDL 3 09/12/2022   Lab Results  Component Value Date   HGBA1C 6.0 09/12/2022   Lab Results  Component Value Date   VITAMINB12 577 12/01/2021   Lab Results  Component Value Date   TSH 1.50 10/18/2021      ASSESSMENT AND PLAN 82 y.o. year old male  has a past medical history of Arthritis, Bladder stone, BPH (benign prostatic hyperplasia), Cancer (HCC), Colon polyps, Hearing aid worn, Hiatal hernia (10/20/2013), History of Barrett's esophagus (2014), History of hypertension, History of prediabetes, Hyperlipemia, Neuromuscular disorder (HCC), Seasonal  allergies, and Sleep apnea. here with:  1.  Obstructive sleep apnea on CPAP  --Good compliance --Residual AHI is elevated  -- I will have the nurses reach out to his DME company to see if we can get him an AutoSet machine. --Follow-up 6 months or sooner if needed     Butch Penny, MSN, NP-C 11/15/2022, 8:14 AM [facility] [address] [place], [place] [address] [phone]

## 2022-11-15 ENCOUNTER — Encounter: Payer: Self-pay | Admitting: Adult Health

## 2022-11-15 ENCOUNTER — Ambulatory Visit (INDEPENDENT_AMBULATORY_CARE_PROVIDER_SITE_OTHER): Payer: Medicare Other | Admitting: Adult Health

## 2022-11-15 VITALS — BP 138/73 | HR 86

## 2022-11-15 DIAGNOSIS — G4733 Obstructive sleep apnea (adult) (pediatric): Secondary | ICD-10-CM

## 2022-12-01 ENCOUNTER — Telehealth: Payer: Self-pay | Admitting: *Deleted

## 2022-12-01 DIAGNOSIS — G4733 Obstructive sleep apnea (adult) (pediatric): Secondary | ICD-10-CM

## 2022-12-01 NOTE — Telephone Encounter (Signed)
Request sent to [facility] to see if patient is eligible for a new autopap machine.

## 2022-12-01 NOTE — Telephone Encounter (Signed)
I sent pt a mychart message.

## 2022-12-01 NOTE — Telephone Encounter (Signed)
[place], [place] Cater, Lourena Simmonds, RN; [place], Leory Plowman; Ages, [place]; [place], Lenna Sciara; Marlowe Sax, [facility],  From what I can pull in the account, it looks like we set him up on 01-10-2018. That means his 5 years would be up on 01-12-2023  Thank you,  Demetrius Charity

## 2022-12-05 NOTE — Telephone Encounter (Signed)
Brad from [employer] responded back and agreed the best thing is for our office to reach  back out early March requesting new machine when he is eligible. For now, they can change the pressure setting on cpap. I sent the order there.

## 2022-12-21 ENCOUNTER — Ambulatory Visit (INDEPENDENT_AMBULATORY_CARE_PROVIDER_SITE_OTHER): Payer: Medicare Other

## 2022-12-21 VITALS — Ht 71.0 in | Wt 213.0 lb

## 2022-12-21 DIAGNOSIS — Z0000 Encounter for general adult medical examination without abnormal findings: Secondary | ICD-10-CM

## 2022-12-21 NOTE — Patient Instructions (Signed)
Todd [name] , Thank you for taking time to come for your Medicare Wellness Visit. I appreciate your ongoing commitment to your health goals. Please review the following plan we discussed and let me know if I can assist you in the future.   These are the goals we discussed:  Goals      Patient Stated     Wants to stay active and healthy.  Get better at the piano        This is a list of the screening recommended for you and due dates:  Health Maintenance  Topic Date Due   COVID-19 Vaccine (4 - 2023-24 season) 07/08/2022   Medicare Annual Wellness Visit  12/22/2023   DTaP/Tdap/Td vaccine (3 - Td or Tdap) 05/23/2024   Pneumonia Vaccine  Completed   Flu Shot  Completed   Zoster (Shingles) Vaccine  Completed   HPV Vaccine  Aged Out    Advanced directives: Advance directive discussed with you today. Even though you declined this today, please call our office should you change your mind, and we can give you the proper paperwork for you to fill out.   Conditions/risks identified: Stay active - keep exercising and getting out socially. Keep up the great work!  Next appointment: Follow up in one year for your annual wellness visit.   Preventive Care 1 Years and Older, Male  Preventive care refers to lifestyle choices and visits with your health care provider that can promote health and wellness. What does preventive care include? A yearly physical exam. This is also called an annual well check. Dental exams once or twice a year. Routine eye exams. Ask your health care provider how often you should have your eyes checked. Personal lifestyle choices, including: Daily care of your teeth and gums. Regular physical activity. Eating a healthy diet. Avoiding tobacco and drug use. Limiting alcohol use. Practicing safe sex. Taking low doses of aspirin every day. Taking vitamin and mineral supplements as recommended by your health care provider. What happens during an annual well check? The  services and screenings done by your health care provider during your annual well check will depend on your age, overall health, lifestyle risk factors, and family history of disease. Counseling  Your health care provider may ask you questions about your: Alcohol use. Tobacco use. Drug use. Emotional well-being. Home and relationship well-being. Sexual activity. Eating habits. History of falls. Memory and ability to understand (cognition). Work and work Statistician. Screening  You may have the following tests or measurements: Height, weight, and BMI. Blood pressure. Lipid and cholesterol levels. These may be checked every 5 years, or more frequently if you are over 76 years old. Skin check. Lung cancer screening. You may have this screening every year starting at age 75 if you have a 30-pack-year history of smoking and currently smoke or have quit within the past 15 years. Fecal occult blood test (FOBT) of the stool. You may have this test every year starting at age 6. Flexible sigmoidoscopy or colonoscopy. You may have a sigmoidoscopy every 5 years or a colonoscopy every 10 years starting at age 57. Prostate cancer screening. Recommendations will vary depending on your family history and other risks. Hepatitis C blood test. Hepatitis B blood test. Sexually transmitted disease (STD) testing. Diabetes screening. This is done by checking your blood sugar (glucose) after you have not eaten for a while (fasting). You may have this done every 1-3 years. Abdominal aortic aneurysm (AAA) screening. You may need this if  you are a current or former smoker. Osteoporosis. You may be screened starting at age 34 if you are at high risk. Talk with your health care provider about your test results, treatment options, and if necessary, the need for more tests. Vaccines  Your health care provider may recommend certain vaccines, such as: Influenza vaccine. This is recommended every year. Tetanus,  diphtheria, and acellular pertussis (Tdap, Td) vaccine. You may need a Td booster every 10 years. Zoster vaccine. You may need this after age 66. Pneumococcal 13-valent conjugate (PCV13) vaccine. One dose is recommended after age 80. Pneumococcal polysaccharide (PPSV23) vaccine. One dose is recommended after age 77. Talk to your health care provider about which screenings and vaccines you need and how often you need them. This information is not intended to replace advice given to you by your health care provider. Make sure you discuss any questions you have with your health care provider. Document Released: 11/20/2015 Document Revised: 07/13/2016 Document Reviewed: 08/25/2015 Elsevier Interactive Patient Education  2017 [place] Prevention in the Home Falls can cause injuries. They can happen to people of all ages. There are many things you can do to make your home safe and to help prevent falls. What can I do on the outside of my home? Regularly fix the edges of walkways and driveways and fix any cracks. Remove anything that might make you trip as you walk through a door, such as a raised step or threshold. Trim any bushes or trees on the path to your home. Use bright outdoor lighting. Clear any walking paths of anything that might make someone trip, such as rocks or tools. Regularly check to see if handrails are loose or broken. Make sure that both sides of any steps have handrails. Any raised decks and porches should have guardrails on the edges. Have any leaves, snow, or ice cleared regularly. Use sand or salt on walking paths during winter. Clean up any spills in your garage right away. This includes oil or grease spills. What can I do in the bathroom? Use night lights. Install grab bars by the toilet and in the tub and shower. Do not use towel bars as grab bars. Use non-skid mats or decals in the tub or shower. If you need to sit down in the shower, use a plastic,  non-slip stool. Keep the floor dry. Clean up any water that spills on the floor as soon as it happens. Remove soap buildup in the tub or shower regularly. Attach bath mats securely with double-sided non-slip rug tape. Do not have throw rugs and other things on the floor that can make you trip. What can I do in the bedroom? Use night lights. Make sure that you have a light by your bed that is easy to reach. Do not use any sheets or blankets that are too big for your bed. They should not hang down onto the floor. Have a firm chair that has side arms. You can use this for support while you get dressed. Do not have throw rugs and other things on the floor that can make you trip. What can I do in the kitchen? Clean up any spills right away. Avoid walking on wet floors. Keep items that you use a lot in easy-to-reach places. If you need to reach something above you, use a strong step stool that has a grab bar. Keep electrical cords out of the way. Do not use floor polish or wax that makes floors slippery. If  you must use wax, use non-skid floor wax. Do not have throw rugs and other things on the floor that can make you trip. What can I do with my stairs? Do not leave any items on the stairs. Make sure that there are handrails on both sides of the stairs and use them. Fix handrails that are broken or loose. Make sure that handrails are as long as the stairways. Check any carpeting to make sure that it is firmly attached to the stairs. Fix any carpet that is loose or worn. Avoid having throw rugs at the top or bottom of the stairs. If you do have throw rugs, attach them to the floor with carpet tape. Make sure that you have a light switch at the top of the stairs and the bottom of the stairs. If you do not have them, ask someone to add them for you. What else can I do to help prevent falls? Wear shoes that: Do not have high heels. Have rubber bottoms. Are comfortable and fit you well. Are closed  at the toe. Do not wear sandals. If you use a stepladder: Make sure that it is fully opened. Do not climb a closed stepladder. Make sure that both sides of the stepladder are locked into place. Ask someone to hold it for you, if possible. Clearly mark and make sure that you can see: Any grab bars or handrails. First and last steps. Where the edge of each step is. Use tools that help you move around (mobility aids) if they are needed. These include: Canes. Walkers. Scooters. Crutches. Turn on the lights when you go into a dark area. Replace any light bulbs as soon as they burn out. Set up your furniture so you have a clear path. Avoid moving your furniture around. If any of your floors are uneven, fix them. If there are any pets around you, be aware of where they are. Review your medicines with your doctor. Some medicines can make you feel dizzy. This can increase your chance of falling. Ask your doctor what other things that you can do to help prevent falls. This information is not intended to replace advice given to you by your health care provider. Make sure you discuss any questions you have with your health care provider. Document Released: 08/20/2009 Document Revised: 03/31/2016 Document Reviewed: 11/28/2014 Elsevier Interactive Patient Education  2017 [employer].

## 2022-12-21 NOTE — Progress Notes (Signed)
Subjective:   Todd [name] is a 82 y.o. male who presents for Medicare Annual/Subsequent preventive examination.  I connected with  Despina Hick on 12/21/22 by a audio enabled telemedicine application and verified that I am speaking with the correct person using two identifiers.  Patient Location: Home  Provider Location: Home Office  I discussed the limitations of evaluation and management by telemedicine. The patient expressed understanding and agreed to proceed.   Review of Systems     Cardiac Risk Factors include: advanced age (>67mn, >>36women);dyslipidemia;male gender;Other (see comment), Risk factor comments: OSA on CPAP     Objective:    Today's Vitals   12/21/22 0830  Weight: 213 lb (96.6 kg)  Height: 5' 11"$  (1.803 m)  PainSc: 3    Body mass index is 29.71 kg/m.     12/21/2022    8:41 AM 10/18/2021    7:52 AM 10/14/2021    8:27 AM 03/08/2021    5:48 AM 10/07/2020    1:07 PM 09/03/2018    8:17 AM 05/03/2018   11:00 AM  Advanced Directives  Does Patient Have a Medical Advance Directive? Yes Yes Yes Yes Yes Yes No  Type of AParamedicof AEnigmaLiving will  HHallamLiving will  HDu PontLiving will [place]ceansideLiving will   Does patient want to make changes to medical advance directive?    No - Guardian declined  No - Patient declined   Copy of [place] Chart? No - copy requested  No - copy requested  No - copy requested Yes   Would patient like information on creating a medical advance directive?       No - Patient declined    Current Medications (verified) Outpatient Encounter Medications as of 12/21/2022  Medication Sig   acetaminophen (TYLENOL) 500 MG tablet Take 1,000 mg by mouth every 6 (six) hours as needed.   Aspirin-Caffeine (BC FAST PAIN RELIEF ARTHRITIS PO) Take by mouth.   Black Pepper-Turmeric (TURMERIC CURCUMIN) 03-999 MG CAPS Take by mouth.    collodion LIQD 2 mLs as needed. Capsule   diclofenac Sodium (VOLTAREN) 1 % GEL Apply 4 g topically 4 (four) times daily. (Patient taking differently: Apply 4 g topically as needed.)   Digestive Enzymes (PAPAYA AND ENZYMES PO) Take by mouth.   fish oil-omega-3 fatty acids 1000 MG capsule daily. [id number] BID   GARLIC PO Take [id number] by mouth 2 (two) times daily. 2 tabs bid per pt   Multiple Vitamin (MULTIVITAMIN) tablet Take 1 tablet by mouth daily.   nystatin-triamcinolone (MYCOLOG II) cream Apply topically 4 (four) times daily.   pravastatin (PRAVACHOL) 20 MG tablet TAKE 1 TABLET BY MOUTH EVERY DAY CAN START FEW DAYS PER WEEK AND INCREASE IF TOLERATED   psyllium (METAMUCIL) 58.6 % powder Take 1 packet by mouth daily.   No facility-administered encounter medications on file as of 12/21/2022.    Allergies (verified) Shellfish allergy, Ace inhibitors, Amoxicillin, Darvon, and Toprol xl [metoprolol tartrate]   History: Past Medical History:  Diagnosis Date   Arthritis    hands   Bladder stone    BPH (benign prostatic hyperplasia)    Cancer ([place]    skin cancer multiple; followed by [facility]/dermatology yearly.   Colon polyps     Previous polyps on colonoscopy.  [facility].     Hearing aid worn    both ears   Hiatal hernia 10/20/2013   EGD  confirmed.  [facility].   History of Barrett's esophagus 2014   pt told by dr Watt Climes no barrett's esophagus 03-03-2021   History of hypertension    no meds since 2019   History of prediabetes    non pre dm since june 2021 weight loss   Hyperlipemia    Neuromuscular disorder (HCC)    numbness rt hand   Seasonal allergies    Sleep apnea    uses CPAP nightly   Past Surgical History:  Procedure Laterality Date   BLADDER STONE REMOVAL  2011, 2018   CARPAL TUNNEL RELEASE  01/17/2012   Procedure: CARPAL TUNNEL RELEASE;  Surgeon: Cammie Sickle., MD;  Location: [place];  Service: Orthopedics;  Laterality: Right;   CERVICAL FUSION  1976    COLONOSCOPY  10/20/2013   [facility].  Polyps.    CYSTOSCOPY WITH LITHOLAPAXY N/A 01/09/2017   Procedure: CYSTOSCOPY WITH LITHOLAPAXY;  Surgeon: Franchot Gallo, MD;  Location: [facility];  Service: Urology;  Laterality: N/A;   ESOPHAGOGASTRODUODENOSCOPY  10/20/2013   [facility]; biopsy of area concernig for Barrett's; gastritis.   EYE SURGERY  2012   both cataracts   FRACTURE SURGERY Right 1960's   3 fingers   HOLMIUM LASER APPLICATION N/A [id number]   Procedure: HOLMIUM LASER APPLICATION;  Surgeon: Franchot Gallo, MD;  Location: [facility];  Service: Urology;  Laterality: N/A;   QUADRICEPS TENDON REPAIR  2003   bilateral   TRANSURETHRAL RESECTION OF PROSTATE N/A 03/08/2021   Procedure: TRANSURETHRAL RESECTION OF THE PROSTATE (TURP);  Surgeon: Franchot Gallo, MD;  Location: [facility];  Service: Urology;  Laterality: N/A;   ULNAR NERVE TRANSPOSITION  01/17/2012   Procedure: ULNAR NERVE DECOMPRESSION/TRANSPOSITION;  Surgeon: Cammie Sickle., MD;  Location: [place];  Service: Orthopedics;  Laterality: Right;  decompression of ulnar nerve only  at right cubital tunnel   ULNAR NERVE TRANSPOSITION Left 06/01/2017   Procedure: DECOMPRESSION ULNAR NERVE LEFT CUBITAL TUNNEL;  Surgeon: Leanora Cover, MD;  Location: [place];  Service: Orthopedics;  Laterality: Left;   UPPER GASTROINTESTINAL ENDOSCOPY     VASECTOMY  yrs ago   Family History  Problem Relation Age of Onset   Heart failure Mother        in 25s   Cirrhosis Father    Hypertension Father    Alcohol abuse Father    Sleep apnea Father    Arthritis Brother        Rheumatoid arthritis   Hypertension Brother    Neuropathy Neg Hx    Social History   Socioeconomic History   Marital status: Married    Spouse name: Tanishq Kellough   Number of children: 2   Years of education: 12   Highest education level: Not on file  Occupational History   Occupation: Retired   Tobacco Use   Smoking status: Former    Packs/day: 2.00    Years: 15.00    Total pack years: 30.00    Types: Cigarettes    Quit date: 01/11/1974    Years since quitting: 48.9   Smokeless tobacco: Never  Vaping Use   Vaping Use: Never used  Substance and Sexual Activity   Alcohol use: Yes    Alcohol/week: 1.0 standard drink of alcohol    Types: 1 Standard drinks or equivalent per week    Comment: occ   Drug use: No   Sexual activity: Not Currently  Other Topics Concern  Not on file  Social History Narrative   Marital status:  Married x 42 years      Children: 2 daughters; 3 grandchildren.      Employment; retired in 1998 from [employer]; retired age 4 from [employer] after 14 years.      Lives: with wife.      Tobacco: former smoker; quit at age 53.  Smoked 15 years.      Alcohol:  On vacation or special occasions.        Education: [employer].       Exercise: Gym 3-4 times a week for 2 hours.; exercises 7 days per week.   Elliptical for one hour.      Advanced Directives: none; +desires FULL CODE.        ADLs: independent with ADLs; drives.  Does not walk with assistant devices.     Drinks 3-4 caffeine drinks a day    Social Determinants of Health   Financial Resource Strain: Low Risk  (12/21/2022)   Overall Financial Resource Strain (CARDIA)    Difficulty of Paying Living Expenses: Not hard at all  Food Insecurity: No Food Insecurity (12/21/2022)   Hunger Vital Sign    Worried About Running Out of Food in the Last Year: Never true    Ran Out of Food in the Last Year: Never true  Transportation Needs: No Transportation Needs (12/21/2022)   PRAPARE - Hydrologist (Medical): No    Lack of Transportation (Non-Medical): No  Physical Activity: Sufficiently Active (12/21/2022)   Exercise Vital Sign    Days of Exercise per Week: 7 days    Minutes of Exercise per Session: 30 min  Stress: No Stress Concern Present (12/21/2022)    [place]    Feeling of Stress : Not at all  Social Connections: [place] (12/21/2022)   Social Connection and Isolation Panel [NHANES]    Frequency of Communication with Friends and Family: More than three times a week    Frequency of Social Gatherings with Friends and Family: Three times a week    Attends Religious Services: More than 4 times per year    Active Member of Clubs or Organizations: Yes    Attends Music therapist: More than 4 times per year    Marital Status: Married    Tobacco Counseling Counseling given: Not Answered   Clinical Intake:  Pre-visit preparation completed: Yes  Pain : 0-10 Pain Score: 3  Pain Type: Chronic pain Pain Location: Hand Pain Orientation: Right, Left Pain Descriptors / Indicators: Aching Pain Onset: More than a month ago Pain Frequency: Intermittent     BMI - recorded: 29.71 Nutritional Status: BMI 25 -29 Overweight Nutritional Risks: None Diabetes: No  How often do you need to have someone help you when you read instructions, pamphlets, or other written materials from your doctor or pharmacy?: 1 - Never  Diabetic? no  Interpreter Needed?: No  Information entered by :: Myrtle Haller, LPN   Activities of Daily Living    12/21/2022    8:42 AM  In your present state of health, do you have any difficulty performing the following activities:  Hearing? 1  Comment wears hearing aids  Vision? 0  Difficulty concentrating or making decisions? 0  Walking or climbing stairs? 0  Dressing or bathing? 0  Doing errands, shopping? 0  Preparing Food and eating ? N  Using the Toilet?  N  In the past six months, have you accidently leaked urine? N  Do you have problems with loss of bowel control? N  Managing your Medications? N  Managing your Finances? N  Housekeeping or managing your Housekeeping? N    Patient Care Team: Wendie Agreste, MD as  PCP - General (Family Medicine) Marygrace Drought, MD as Consulting Physician (Ophthalmology) Franchot Gallo, MD as Consulting Physician (Urology) Celene Squibb, MD (Dermatology) Burnell Blanks, MD as Consulting Physician (Cardiology) Star Age, MD as Attending Physician (Neurology)  Indicate any recent Medical Services you may have received from other than [facility] providers in the past year (date may be approximate).     Assessment:   This is a routine wellness examination for Todd [name].  Hearing/Vision screen Hearing Screening - Comments:: Wears hearing aids - sees [facility] prn Vision Screening - Comments:: Wears reading glasses only prn - up to date with routine eye exams with [facility]   Dietary issues and exercise activities discussed: Current Exercise Habits: Home exercise routine, Type of exercise: walking;stretching, Time (Minutes): 30, Frequency (Times/Week): 7, Weekly Exercise (Minutes/Week): 210, Intensity: Moderate, Exercise limited by: orthopedic condition(s)   Goals Addressed             This Visit's Progress    Patient Stated       Wants to stay active and healthy.  Get better at the piano     COMPLETED: Weight (lb) < 184 lb (83.5 kg)   213 lb (96.6 kg)    Patient wants to maintain his weight he is currently at       Depression Screen    12/21/2022    8:39 AM 09/12/2022    9:03 AM 10/18/2021    7:51 AM 10/14/2021    8:28 AM 10/14/2021    8:24 AM 04/07/2021   11:12 AM 10/07/2020    3:27 PM  PHQ 2/9 Scores  PHQ - 2 Score 0 0 0 0 0 0 0  PHQ- 9 Score  0    0     Fall Risk    12/21/2022    8:32 AM 10/17/2022   10:47 AM 09/12/2022    9:03 AM 04/20/2022    3:11 PM 10/18/2021    7:50 AM  Fall Risk   Falls in the past year? 0 0 0 0 0  Number falls in past yr: 0 0 0 0 0  Injury with Fall? 0 0 0 0 0  Risk for fall due to : Orthopedic patient No Fall Risks No Fall Risks No Fall Risks No Fall Risks  Follow up Falls  prevention discussed;Education provided Falls evaluation completed Falls evaluation completed Falls evaluation completed Falls evaluation completed    [place]:  Any stairs in or around the home? Yes  If so, are there any without handrails? No  Home free of loose throw rugs in walkways, pet beds, electrical cords, etc? Yes  Adequate lighting in your home to reduce risk of falls? Yes   ASSISTIVE DEVICES UTILIZED TO PREVENT FALLS:  Life alert? No  Use of a cane, walker or w/c? No  Grab bars in the bathroom? Yes  Shower chair or bench in shower? Yes  Elevated toilet seat or a handicapped toilet? Yes   TIMED UP AND GO:  Was the test performed? No . Telephonic visit   Cognitive Function:        12/21/2022    8:43 AM  10/18/2021    7:51 AM 10/07/2020    1:08 PM 09/10/2019    9:07 AM 09/03/2018    8:04 AM  6CIT Screen  What Year? 0 points 0 points 0 points 0 points 0 points  What month? 0 points 0 points 0 points 0 points 0 points  What time? 0 points 0 points 0 points 0 points 0 points  Count back from 20 0 points 0 points 0 points 0 points 0 points  Months in reverse 0 points 0 points 0 points 0 points 0 points  Repeat phrase 2 points 0 points 0 points 0 points 0 points  Total Score 2 points 0 points 0 points 0 points 0 points    Immunizations Immunization History  Administered Date(s) Administered   Fluad Quad(high Dose 65+) 07/29/2022   Influenza Inj Mdck Quad Pf 08/13/2019   Influenza, High Dose Seasonal PF 07/24/2018   Influenza,inj,Quad PF,6+ Mos 07/15/2013, 07/21/2014, 07/07/2015, 07/06/2016, 08/04/2017   Influenza-Unspecified 08/12/2020   PFIZER(Purple Top)SARS-COV-2 Vaccination 12/15/2019, 01/09/2020, 09/10/2020   Pneumococcal Conjugate-13 08/11/2014   Pneumococcal Polysaccharide-23 03/10/2008, 08/23/2016   Tdap 06/14/2010, 05/23/2014   Zoster Recombinat (Shingrix) 09/11/2017, 02/02/2018   Zoster, Live 11/07/2010    TDAP  status: Up to date  Flu Vaccine status: Up to date  Pneumococcal vaccine status: Up to date  Covid-19 vaccine status: Declined, Education has been provided regarding the importance of this vaccine but patient still declined. Advised may receive this vaccine at local pharmacy or Health Dept.or vaccine clinic. Aware to provide a copy of the vaccination record if obtained from local pharmacy or Health Dept. Verbalized acceptance and understanding.  Qualifies for Shingles Vaccine? Yes   Zostavax completed Yes   Shingrix Completed?: Yes  Screening Tests Health Maintenance  Topic Date Due   COVID-19 Vaccine (4 - 2023-24 season) 07/08/2022   Medicare Annual Wellness (AWV)  12/22/2023   DTaP/Tdap/Td (3 - Td or Tdap) 05/23/2024   Pneumonia Vaccine 48+ Years old  Completed   INFLUENZA VACCINE  Completed   Zoster Vaccines- Shingrix  Completed   HPV VACCINES  Aged Out    Health Maintenance  Health Maintenance Due  Topic Date Due   COVID-19 Vaccine (4 - 2023-24 season) 07/08/2022    Colorectal cancer screening: No longer required.   Lung Cancer Screening: (Low Dose CT Chest recommended if Age 64-80 years, 30 pack-year currently smoking OR have quit w/in 15years.) does not qualify.   Additional Screening:  Hepatitis C Screening: does not qualify  Vision Screening: Recommended annual ophthalmology exams for early detection of glaucoma and other disorders of the eye. Is the patient up to date with their annual eye exam?  Yes  Who is the provider or what is the name of the office in which the patient attends annual eye exams? [facility] If pt is not established with a provider, would they like to be referred to a provider to establish care? No .   Dental Screening: Recommended annual dental exams for proper oral hygiene  Community Resource Referral / Chronic Care Management: CRR required this visit?  No   CCM required this visit?  No      Plan:     I have personally reviewed and  noted the following in the patient's chart:   Medical and social history Use of alcohol, tobacco or illicit drugs  Current medications and supplements including opioid prescriptions. Patient is not currently taking opioid prescriptions. Functional ability and status Nutritional status Physical activity Advanced directives List of  other physicians Hospitalizations, surgeries, and ER visits in previous 12 months Vitals Screenings to include cognitive, depression, and falls Referrals and appointments  In addition, I have reviewed and discussed with patient certain preventive protocols, quality metrics, and best practice recommendations. A written personalized care plan for preventive services as well as general preventive health recommendations were provided to patient.     Sandrea Hammond, LPN   [id number]   Nurse Notes: None

## 2023-01-09 NOTE — Addendum Note (Signed)
Addended by: Gildardo Griffes on: 01/09/2023 04:17 PM   Modules accepted: Orders

## 2023-01-09 NOTE — Telephone Encounter (Signed)
Per previous message from [employer], patient would be eligible for new machine starting 01/12/23. I have run a new report for NP review. We can place order for new machine.

## 2023-01-09 NOTE — Telephone Encounter (Signed)
Yes thank you

## 2023-01-12 NOTE — Telephone Encounter (Signed)
Called pt and scheduled initial auto pap with Dr. Rexene Alberts on 5/23 @ 7:15am. Informed pt to bring machine and power cord to this appointment.

## 2023-02-13 IMAGING — DX DG KNEE COMPLETE 4+V*R*
4 series · 4 of 4 positions shown · non-contrast
Comparison: 04/12/2017

CLINICAL DATA: Right knee pain and stiffness for 1 month

EXAM:
RIGHT KNEE - COMPLETE 4+ VIEW

[knee ap]
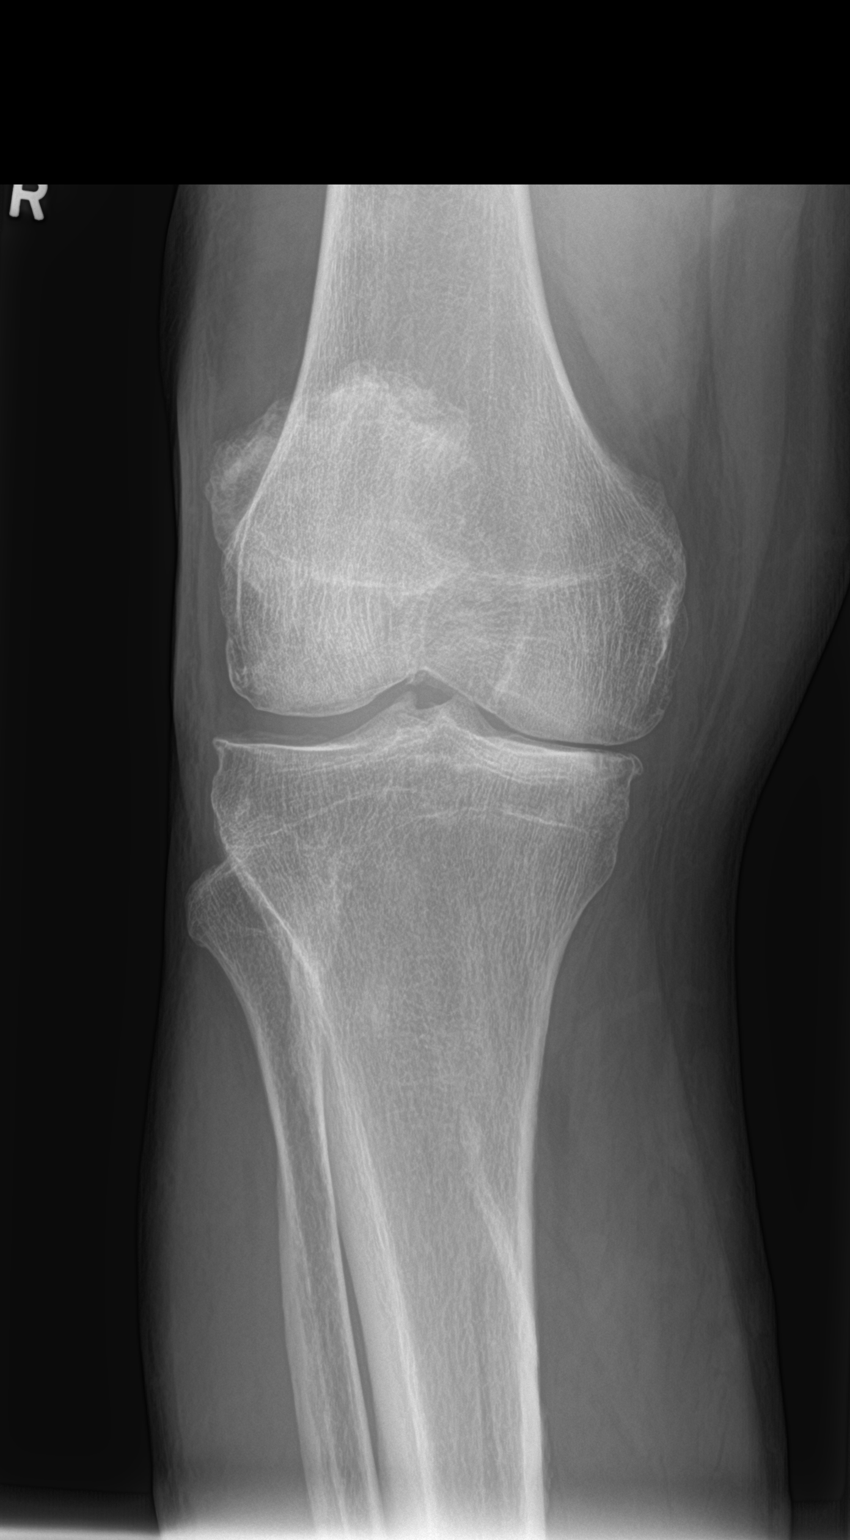

[knee tunnel]
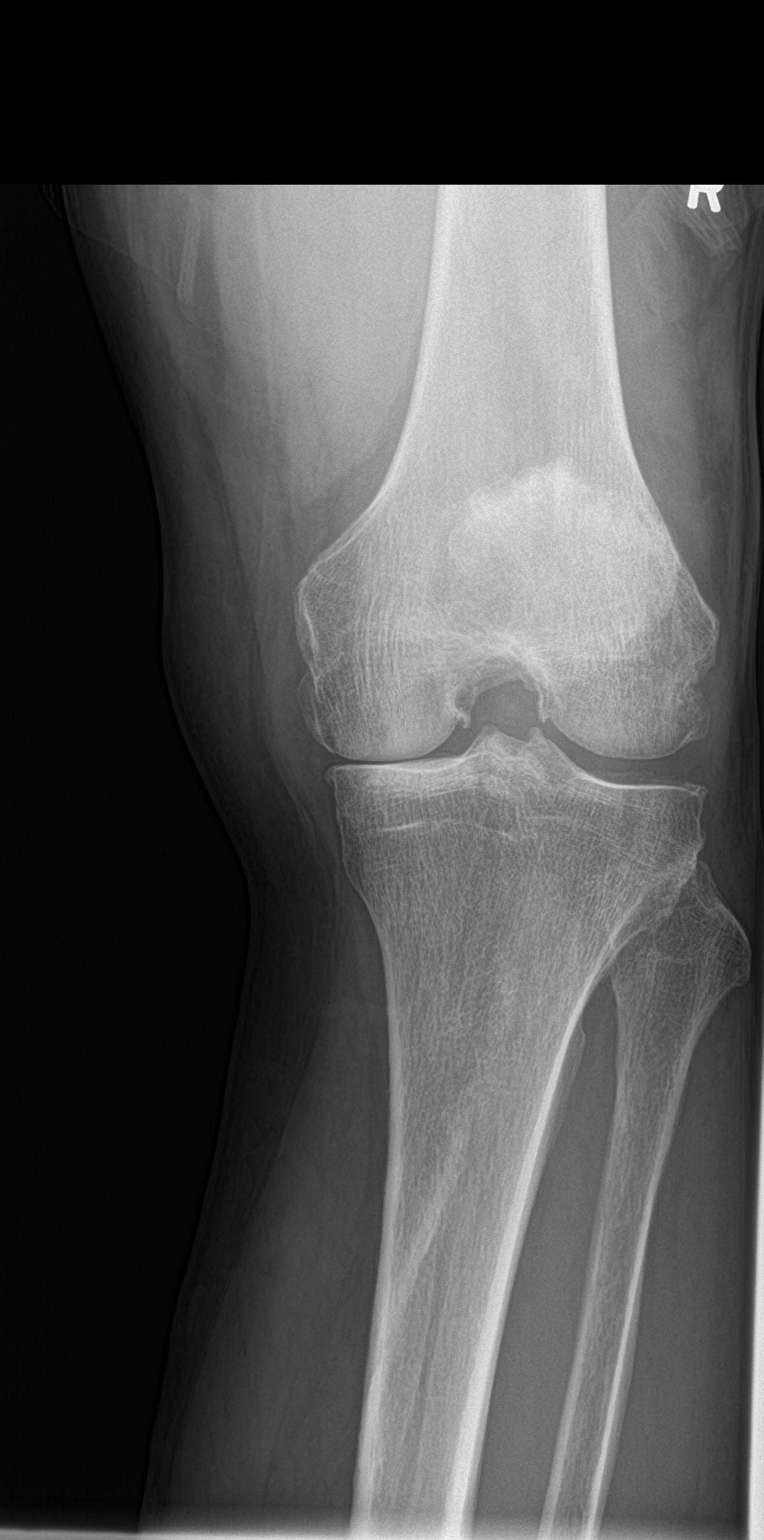

[knee lat]
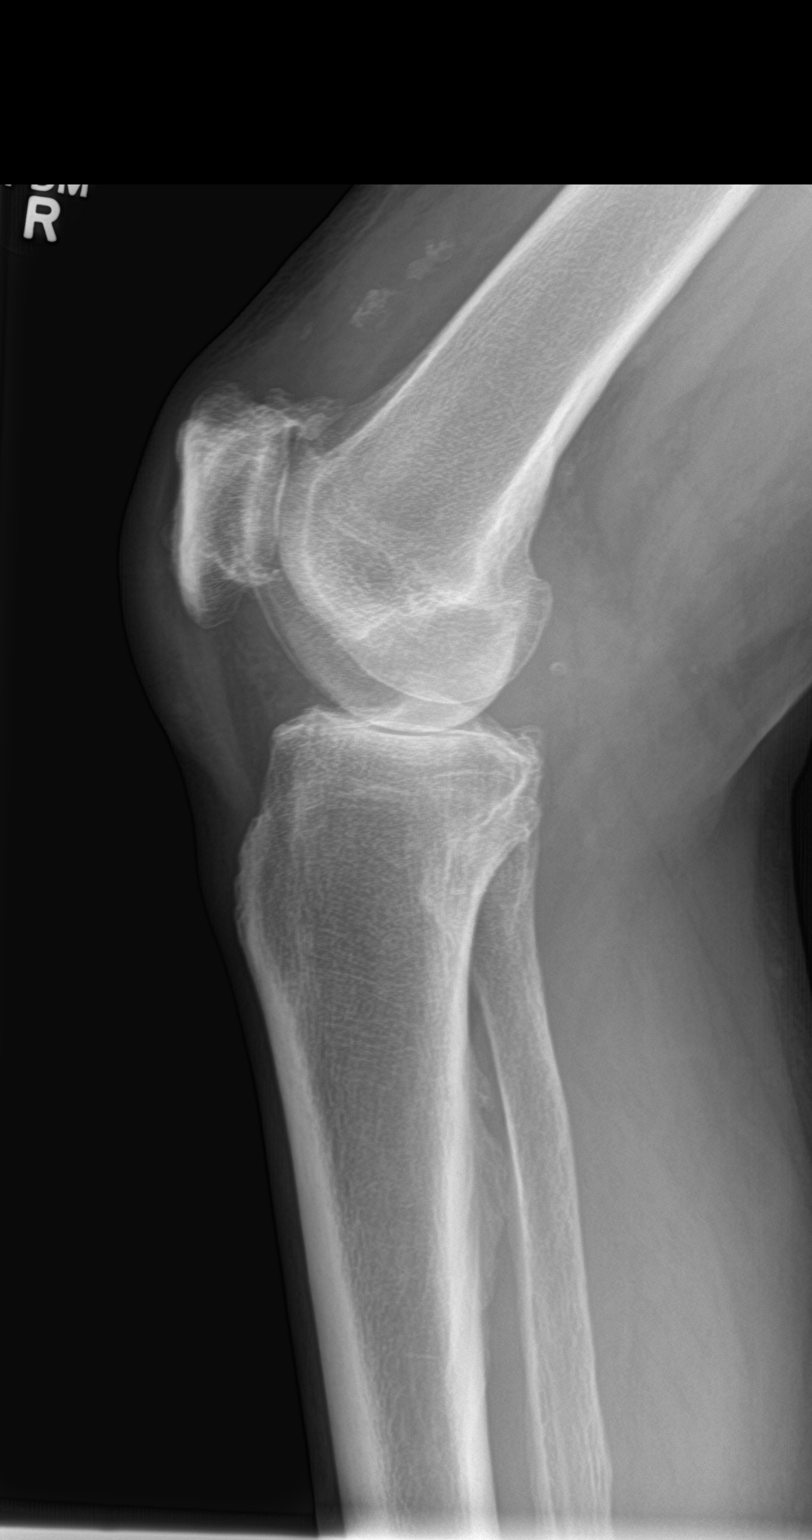

[sunrise]
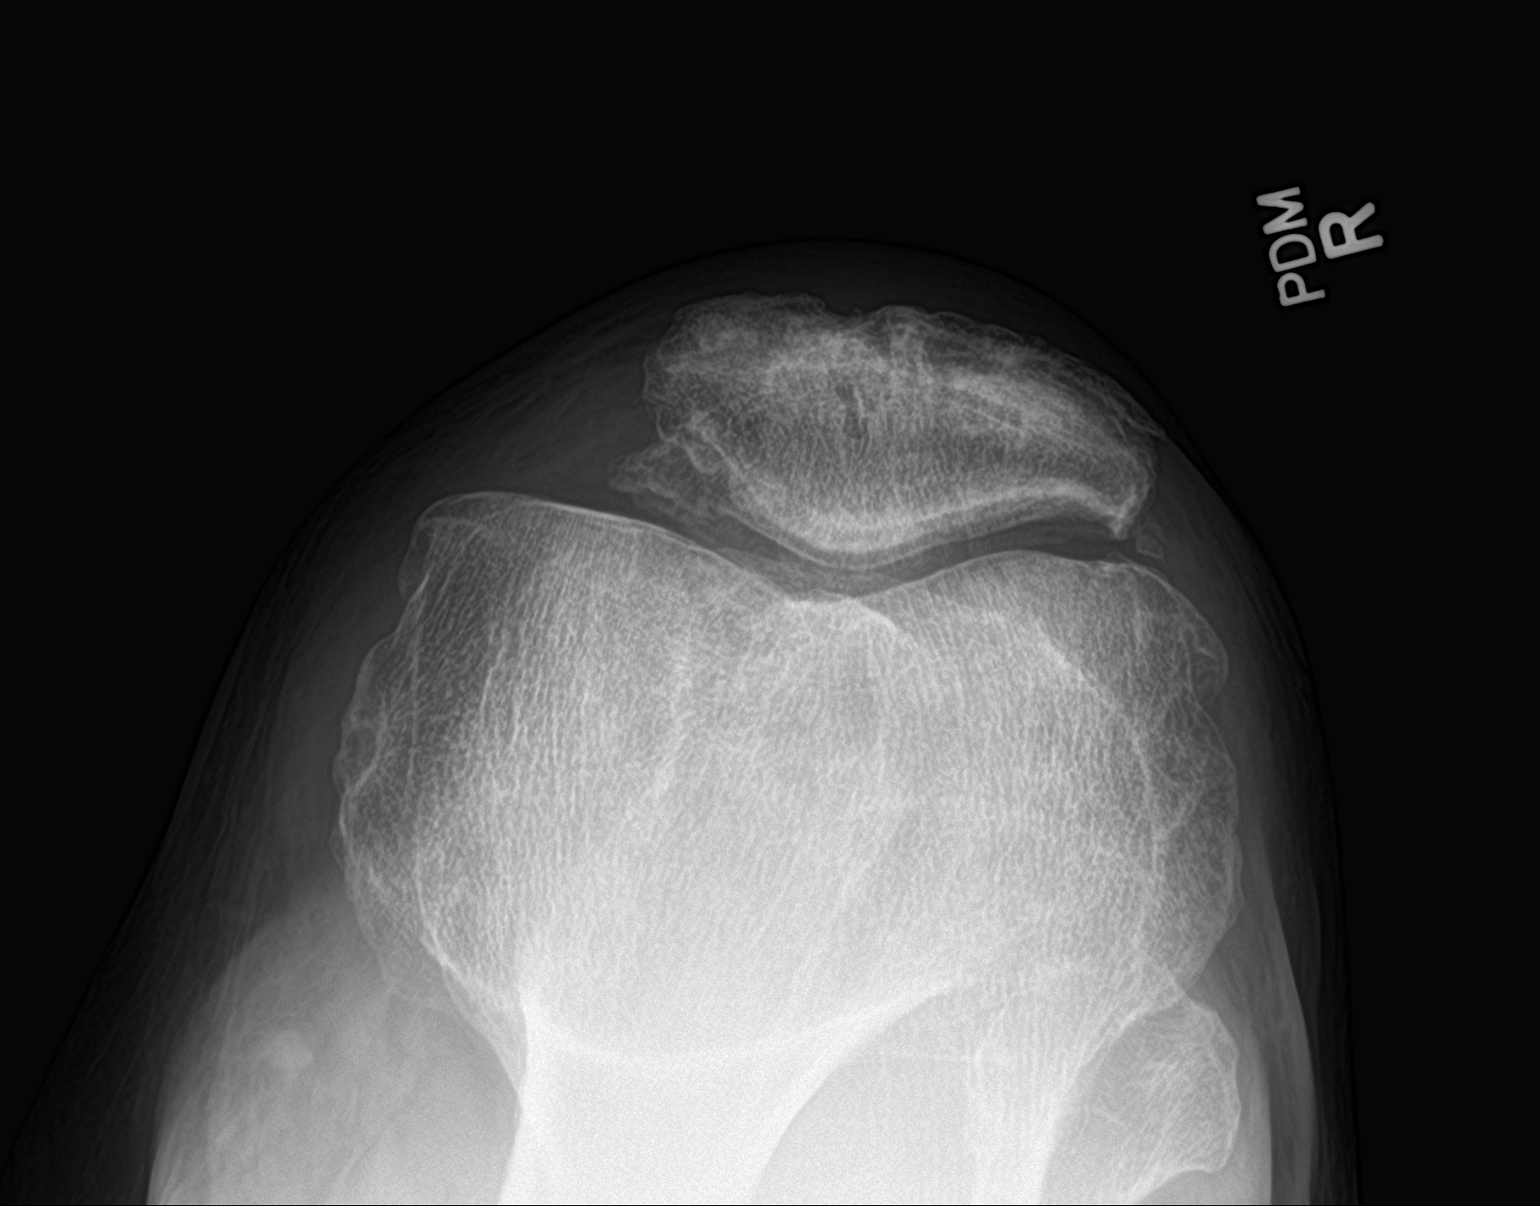

[4 of 4 positions shown; findings below may reference images not displayed]

FINDINGS: No evidence of fracture or dislocation. Moderate tricompartmental
osteoarthritis, most severe within the medial compartment. Findings
have progressed from prior. Probable small joint effusion with small
loose bodies in the suprapatellar pouch. Soft tissues are
unremarkable.
IMPRESSION: Moderate tricompartmental osteoarthritis, most severe within the
medial compartment. Findings have progressed from prior. No acute
findings.

## 2023-03-08 NOTE — Addendum Note (Signed)
Addended by: Huston Foley on: 03/08/2023 12:23 PM   Modules accepted: Orders

## 2023-03-08 NOTE — Telephone Encounter (Signed)
Received a compliance report on Todd [name] from [employer]. Appears the patient is having high AHI. Will forward to MD for review in case changes should be made prior to upcoming apt.

## 2023-03-08 NOTE — Telephone Encounter (Signed)
Changed pressure  max to 16cm.

## 2023-03-08 NOTE — Telephone Encounter (Signed)
I recommend we increase his AutoPap max pressure to 16 cm at this time.  Order placed.

## 2023-03-09 NOTE — Telephone Encounter (Signed)
New, Doristine Mango, RN; Penni Homans; Marveen Reeks; Santina Evans Received, Thank you!     Previous Messages    ----- Message ----- From: Guy Begin, RN Sent: 03/08/2023   2:31 PM EDT To: Henderson Newcomer; Kathyrn Sheriff; Santina Evans; * Subject: increase max pressure autopap to 16cm          New order in epic.  Done. In [place].  Lerry Liner. Seliga Male, 82 y.o., Apr 17, 1941 MRN: [id number] Phone: [phone]   Delmer Islam

## 2023-03-13 NOTE — Telephone Encounter (Signed)
In [place] I have pended changes to the pt's machine to increase the max pressure to 16 cm H2O vs previous 15 cm H2O.

## 2023-03-20 ENCOUNTER — Encounter: Payer: Self-pay | Admitting: Family Medicine

## 2023-03-20 ENCOUNTER — Ambulatory Visit (INDEPENDENT_AMBULATORY_CARE_PROVIDER_SITE_OTHER): Payer: Medicare Other | Admitting: Family Medicine

## 2023-03-20 VITALS — BP 122/70 | HR 74 | Temp 98.0°F | Resp 16 | Ht 71.0 in | Wt 215.0 lb

## 2023-03-20 DIAGNOSIS — R7982 Elevated C-reactive protein (CRP): Secondary | ICD-10-CM | POA: Diagnosis not present

## 2023-03-20 DIAGNOSIS — R7303 Prediabetes: Secondary | ICD-10-CM

## 2023-03-20 DIAGNOSIS — E782 Mixed hyperlipidemia: Secondary | ICD-10-CM | POA: Diagnosis not present

## 2023-03-20 LAB — COMPREHENSIVE METABOLIC PANEL
ALT: 23 U/L (ref 0–53)
AST: 28 U/L (ref 0–37)
Albumin: 4.4 g/dL (ref 3.5–5.2)
Alkaline Phosphatase: 83 U/L (ref 39–117)
BUN: 20 mg/dL (ref 6–23)
CO2: 28 mEq/L (ref 19–32)
Calcium: 9.5 mg/dL (ref 8.4–10.5)
Chloride: 100 mEq/L (ref 96–112)
Creatinine, Ser: 0.86 mg/dL (ref 0.40–1.50)
GFR: 81.91 mL/min (ref >60.00)
Glucose, Bld: 107 mg/dL — ABNORMAL HIGH (ref 70–99)
Potassium: 3.8 mEq/L (ref 3.5–5.1)
Sodium: 137 mEq/L (ref 135–145)
Total Bilirubin: 0.7 mg/dL (ref 0.2–1.2)
Total Protein: 7.4 g/dL (ref 6.0–8.3)

## 2023-03-20 LAB — LIPID PANEL
Cholesterol: 177 mg/dL (ref 0–200)
HDL: 43.3 mg/dL (ref >39.00)
NonHDL: 134.1
Total CHOL/HDL Ratio: 4
Triglycerides: 256 mg/dL — ABNORMAL HIGH (ref 0.0–149.0)
VLDL: 51.2 mg/dL — ABNORMAL HIGH (ref 0.0–40.0)

## 2023-03-20 LAB — HEMOGLOBIN A1C: Hgb A1c MFr Bld: 6 % (ref 4.6–6.5)

## 2023-03-20 LAB — LDL CHOLESTEROL, DIRECT: Direct LDL: 107 mg/dL

## 2023-03-20 NOTE — Patient Instructions (Addendum)
Thanks for coming in today.  Home blood pressures look good.  Blood pressure is better here in the office as well.  I will check some labs for the cholesterol and prediabetes, no medication changes for today.  I will refer you to cardiology to decide if any further testing needed.  Let me know if there are questions.  Take care!

## 2023-03-20 NOTE — Progress Notes (Signed)
Subjective:  Patient ID: HAWLEY LUBY, male    DOB: 1941/03/15  Age: 82 y.o. MRN: [id number]  CC:  Chief Complaint  Patient presents with   Follow-up    Follow up    HPI DINK TILTON presents for   Prediabetes: Diet/exercise approach, no current medications. Still watching diet, exercising some form daily, with resistance training weekly.  Health screening on 01/26/23: Normal AAA screen, no afib, normal left carotid screen. Mild CAD R carotid (equal velocity bilaterally 11-125cm/s), CRP elevated 1.7, normal PAD screening Lab Results  Component Value Date   HGBA1C 6.0 09/12/2022   Wt Readings from Last 3 Encounters:  03/20/23 215 lb (97.5 kg)  12/21/22 213 lb (96.6 kg)  10/17/22 213 lb 12.8 oz (97 kg)   Hyperlipidemia: Treated with pravastatin 20 mg daily, no myalgias/side effects.  Lab Results  Component Value Date   CHOL 148 09/12/2022   HDL 48.80 09/12/2022   LDLCALC 75 09/12/2022   TRIG 123.0 09/12/2022   CHOLHDL 3 09/12/2022   Lab Results  Component Value Date   ALT 29 09/12/2022   AST 31 09/12/2022   ALKPHOS 85 09/12/2022   BILITOT 0.9 09/12/2022    Elevated blood pressure Noted on prior visits.  No current antihypertensives suspect a component of whitecoat hypertension.  Improved today. Home readings: 108-128/66-80.  Stress test and echo in 2014 normal. Dr. Clifton James. No Cp/dyspena or new fatigue with exercise. Slight lightheaded only with calf raises only for years, no changes or any new symptoms.   BP Readings from Last 3 Encounters:  03/20/23 122/70  11/15/22 138/73  10/17/22 138/76   Followed by urology for prostate.   Health maintenance: COVID booster - deferred.    History Patient Active Problem List   Diagnosis Date Noted   Enlarged prostate with urinary obstruction 03/08/2021   Prediabetes 03/07/2019   Bladder stone 03/20/2017   Nephrolithiasis 03/20/2017   Sensorineural hearing loss (SNHL) of both ears 03/20/2017   OSA on CPAP  02/21/2017   Primary osteoarthritis of both hands 03/01/2016   Mixed hyperlipidemia 03/01/2016   Seasonal allergic rhinitis due to pollen 03/01/2016   BMI 31.0-31.9,adult 03/01/2016   Obesity 03/01/2016   Angioedema 02/16/2013   Past Medical History:  Diagnosis Date   Arthritis    hands   Bladder stone    BPH (benign prostatic hyperplasia)    Cancer (HCC)    skin cancer multiple; followed by [facility]/dermatology yearly.   Colon polyps     Previous polyps on colonoscopy.  [facility].     Hearing aid worn    both ears   Hiatal hernia 10/20/2013   EGD confirmed.  [facility].   History of Barrett's esophagus 2014   pt told by dr Ewing Schlein no barrett's esophagus 03-03-2021   History of hypertension    no meds since 2019   History of prediabetes    non pre dm since june 2021 weight loss   Hyperlipemia    Neuromuscular disorder (HCC)    numbness rt hand   Seasonal allergies    Sleep apnea    uses CPAP nightly   Past Surgical History:  Procedure Laterality Date   BLADDER STONE REMOVAL  2011, 2018   CARPAL TUNNEL RELEASE  01/17/2012   Procedure: CARPAL TUNNEL RELEASE;  Surgeon: [name]., MD;  Location: [facility];  Service: Orthopedics;  Laterality: Right;   CERVICAL FUSION  1976   COLONOSCOPY  10/20/2013   [facility].  Polyps.  CYSTOSCOPY WITH LITHOLAPAXY N/A 01/09/2017   Procedure: CYSTOSCOPY WITH LITHOLAPAXY;  Surgeon: Marcine Matar, MD;  Location: [facility];  Service: Urology;  Laterality: N/A;   ESOPHAGOGASTRODUODENOSCOPY  10/20/2013   [facility]; biopsy of area concernig for Barrett's; gastritis.   EYE SURGERY  2012   both cataracts   FRACTURE SURGERY Right 1960's   3 fingers   HOLMIUM LASER APPLICATION N/A 01/09/2017   Procedure: HOLMIUM LASER APPLICATION;  Surgeon: Marcine Matar, MD;  Location: [facility];  Service: Urology;  Laterality: N/A;   QUADRICEPS TENDON REPAIR  2003   bilateral   TRANSURETHRAL RESECTION OF PROSTATE N/A  03/08/2021   Procedure: TRANSURETHRAL RESECTION OF THE PROSTATE (TURP);  Surgeon: Marcine Matar, MD;  Location: [facility];  Service: Urology;  Laterality: N/A;   ULNAR NERVE TRANSPOSITION  01/17/2012   Procedure: ULNAR NERVE DECOMPRESSION/TRANSPOSITION;  Surgeon: [name]., MD;  Location: [facility];  Service: Orthopedics;  Laterality: Right;  decompression of ulnar nerve only  at right cubital tunnel   ULNAR NERVE TRANSPOSITION Left 06/01/2017   Procedure: DECOMPRESSION ULNAR NERVE LEFT CUBITAL TUNNEL;  Surgeon: Betha Loa, MD;  Location: [facility];  Service: Orthopedics;  Laterality: Left;   UPPER GASTROINTESTINAL ENDOSCOPY     VASECTOMY  yrs ago   Allergies  Allergen Reactions   Shellfish Allergy     Headaches, high blood pressure   Ace Inhibitors Cough   Amoxicillin Nausea And Vomiting   Darvon     Chest pain   Toprol Xl [Metoprolol Tartrate]     Not sure reaction   Prior to Admission medications   Medication Sig Start Date End Date Taking? Authorizing Provider  cetirizine (ZYRTEC) 10 MG tablet Take by mouth. 05/15/17  Yes [provider]  fluticasone (FLONASE) 50 MCG/ACT nasal spray 1 spray Once Daily. 05/15/17  Yes [provider]  glucosamine-chondroitin 500-400 MG tablet Take by mouth. 05/15/17  Yes [provider]  Saw Palmetto 160 MG CAPS Take by mouth. 05/15/17  Yes [provider]  acetaminophen (TYLENOL) 500 MG tablet Take 1,000 mg by mouth every 6 (six) hours as needed.    [provider]  Aspirin-Caffeine (BC FAST PAIN RELIEF ARTHRITIS PO) Take by mouth.    [provider]  Black Pepper-Turmeric (TURMERIC CURCUMIN) 03-999 MG CAPS Take by mouth.    [provider]  collodion LIQD 2 mLs as needed. Capsule    [provider]  diclofenac Sodium (VOLTAREN) 1 % GEL Apply 4 g topically 4 (four) times daily. Patient taking differently: Apply 4 g topically  as needed. 04/20/22   Shade Flood, MD  Digestive Enzymes (PAPAYA AND ENZYMES PO) Take by mouth.    [provider]  fish oil-omega-3 fatty acids 1000 MG capsule daily. 2000 mg BID    [provider]  GARLIC PO Take 161 mg by mouth 2 (two) times daily. 2 tabs bid per pt    [provider]  Multiple Vitamin (MULTIVITAMIN) tablet Take 1 tablet by mouth daily.    [provider]  nystatin-triamcinolone (MYCOLOG II) cream Apply topically 4 (four) times daily. 12/15/22   [provider]  pravastatin (PRAVACHOL) 20 MG tablet TAKE 1 TABLET BY MOUTH EVERY DAY CAN START FEW DAYS PER WEEK AND INCREASE IF TOLERATED 10/17/22   Shade Flood, MD  psyllium (METAMUCIL) 58.6 % powder Take 1 packet by mouth daily.    [provider]  Social History   Socioeconomic History   Marital status: Married    Spouse name: Randoll Bir   Number of children: 2   Years of education: 12   Highest education level: Not on file  Occupational History   Occupation: Retired  Tobacco Use   Smoking status: Former    Packs/day: 2.00    Years: 15.00    Additional pack years: 0.00    Total pack years: 30.00    Types: Cigarettes    Quit date: 01/11/1974    Years since quitting: 49.2   Smokeless tobacco: Never  Vaping Use   Vaping Use: Never used  Substance and Sexual Activity   Alcohol use: Yes    Alcohol/week: 1.0 standard drink of alcohol    Types: 1 Standard drinks or equivalent per week    Comment: occ   Drug use: No   Sexual activity: Not Currently  Other Topics Concern   Not on file  Social History Narrative   Marital status:  Married x 42 years      Children: 2 daughters; 3 grandchildren.      Employment; retired in 1998 from [employer]; retired age 67 from [employer] after 14 years.      Lives: with wife.      Tobacco: former smoker; quit at age 26.  Smoked 15 years.      Alcohol:  On vacation or special occasions.         Education: [employer].       Exercise: Gym 3-4 times a week for 2 hours.; exercises 7 days per week.   Elliptical for one hour.      Advanced Directives: none; +desires FULL CODE.        ADLs: independent with ADLs; drives.  Does not walk with assistant devices.     Drinks 3-4 caffeine drinks a day    Social Determinants of Health   Financial Resource Strain: Low Risk  (12/21/2022)   Overall Financial Resource Strain (CARDIA)    Difficulty of Paying Living Expenses: Not hard at all  Food Insecurity: No Food Insecurity (12/21/2022)   Hunger Vital Sign    Worried About Running Out of Food in the Last Year: Never true    Ran Out of Food in the Last Year: Never true  Transportation Needs: No Transportation Needs (12/21/2022)   PRAPARE - Administrator, Civil Service (Medical): No    Lack of Transportation (Non-Medical): No  Physical Activity: Sufficiently Active (12/21/2022)   Exercise Vital Sign    Days of Exercise per Week: 7 days    Minutes of Exercise per Session: 30 min  Stress: No Stress Concern Present (12/21/2022)   [employer] of Occupational Health - Occupational Stress Questionnaire    Feeling of Stress : Not at all  Social Connections: Socially Integrated (12/21/2022)   Social Connection and Isolation Panel [NHANES]    Frequency of Communication with Friends and Family: More than three times a week    Frequency of Social Gatherings with Friends and Family: Three times a week    Attends Religious Services: More than 4 times per year    Active Member of Clubs or Organizations: Yes    Attends Banker Meetings: More than 4 times per year    Marital Status: Married  Catering manager Violence: Not At Risk (12/21/2022)   Humiliation, Afraid, Rape, and Kick questionnaire    Fear of Current or Ex-Partner: No    Emotionally  Abused: No    Physically Abused: No    Sexually Abused: No    Review of Systems  Constitutional:  Negative for fatigue and  unexpected weight change.  Eyes:  Negative for visual disturbance.  Respiratory:  Negative for cough, chest tightness and shortness of breath.   Cardiovascular:  Negative for chest pain, palpitations and leg swelling.  Gastrointestinal:  Negative for abdominal pain and blood in stool.  Neurological:  Negative for dizziness, light-headedness and headaches.     Objective:   Vitals:   03/20/23 0947  BP: 122/70  Pulse: 74  Resp: 16  Temp: 98 F (36.7 C)  TempSrc: Oral  SpO2: 97%  Weight: 215 lb (97.5 kg)  Height: 5\' 11"  (1.803 m)     Physical Exam Vitals reviewed.  Constitutional:      Appearance: He is well-developed.  HENT:     Head: Normocephalic and atraumatic.  Neck:     Vascular: No carotid bruit or JVD.  Cardiovascular:     Rate and Rhythm: Normal rate and regular rhythm.     Heart sounds: Normal heart sounds. No murmur heard. Pulmonary:     Effort: Pulmonary effort is normal.     Breath sounds: Normal breath sounds. No rales.  Musculoskeletal:     Right lower leg: No edema.     Left lower leg: No edema.  Skin:    General: Skin is warm and dry.  Neurological:     Mental Status: He is alert and oriented to person, place, and time.  Psychiatric:        Mood and Affect: Mood normal.        Assessment & Plan:  TAVION SEELIGER is a 82 y.o. male . Prediabetes - Plan: Hemoglobin A1c, Ambulatory referral to Cardiology  Commended on continued watching diet, active with exercise.  Check labs.  42-month follow-up  Mixed hyperlipidemia - Plan: Comprehensive metabolic panel, Lipid panel, Ambulatory referral to Cardiology  -Tolerating pravastatin, continue same.  Check updated labs.  Elevated C-reactive protein (CRP) - Plan: Ambulatory referral to Cardiology  -Asymptomatic at present, prior stress test, echo was reassuring in 2014.  Borderline blood pressure at prior visits but looks good on home readings currently and improved in office.  Did have health screening  recently with elevated C-reactive protein and he would like to discuss any possible further testing, echo or stress testing with cardiology, referral placed.  No orders of the defined types were placed in this encounter.  Patient Instructions  Thanks for coming in today.  Home blood pressures look good.  Blood pressure is better here in the office as well.  I will check some labs for the cholesterol and prediabetes, no medication changes for today.  I will refer you to cardiology to decide if any further testing needed.  Let me know if there are questions.  Take care!    Signed,   Meredith Staggers, MD [facility], [facility] 03/20/23 10:42 AM

## 2023-03-30 ENCOUNTER — Encounter: Payer: Self-pay | Admitting: Neurology

## 2023-03-30 ENCOUNTER — Ambulatory Visit (INDEPENDENT_AMBULATORY_CARE_PROVIDER_SITE_OTHER): Payer: Medicare Other | Admitting: Neurology

## 2023-03-30 VITALS — BP 129/75 | HR 89 | Ht 71.0 in | Wt 217.2 lb

## 2023-03-30 DIAGNOSIS — G4733 Obstructive sleep apnea (adult) (pediatric): Secondary | ICD-10-CM

## 2023-03-30 DIAGNOSIS — Z789 Other specified health status: Secondary | ICD-10-CM

## 2023-03-30 DIAGNOSIS — R202 Paresthesia of skin: Secondary | ICD-10-CM | POA: Diagnosis not present

## 2023-03-30 DIAGNOSIS — R200 Anesthesia of skin: Secondary | ICD-10-CM

## 2023-03-30 NOTE — Progress Notes (Signed)
Subjective:    Patient ID: Todd [name] is a 82 y.o. male.  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  I last saw him on 11/27/2021, at which time we Talked about his intermittent paresthesias in his lower extremities.  We proceeded with an EMG and nerve conduction velocity test of the lower extremities which was normal.  We also did additional extensive labs, which were benign.  He saw Everlene Other, NP on 11/15/2022, at which time he was compliant with his CPAP of 13 cm but residual AHI was elevated at 18.6/h.  He received a new AutoPap machine in the interim in March 2024.  His set up date was 01/30/2023, he was placed on a pressure setting of 5 to 15 cm, we subsequently increased his maximum pressure to 16 cm on 03/13/2023.  Today, 03/30/2023: I reviewed his AutoPap compliance data from 01/30/2023 through 02/28/2023, which is a total of 30 days, during which time he used his machine every night with percent use days greater than 4 hours at 100%, indicating superb compliance,  average usage of 7 hours and 58 minutes, residual AHI elevated due to obstructive events primarily at 24.3/h, 95th percentile of pressure at 14.9 cm.  We subsequently increased the maximum pressure to 16 cm.  He reports not doing well with the autoPAP. The pressure is difficult to tolerate and sometimes the mask dislodges and makes a loud sound.  He is not resting well.  He does feel that he was able to tolerate the CPAP better when he was still on his old  machine.  His paresthesias and numbness in the feet are unchanged, no significant progression, no pain.  He had knee discomfort, this improved after he got new inserts recently.  The patient's allergies, current medications, family history, past medical history, past social history, past surgical history and problem list were reviewed and updated as appropriate.    Previously (copied from previous notes for reference):      I saw him on 01/06/2021, at which time he was compliant with CPAP.  However, his AHI was highly elevated.  I recommended we increase his CPAP pressure from 8 cm to 10 cm.     He saw Butch Penny, NP, in the interim on 05/05/2021 at which time he was compliant with his CPAP but his residual AHI was elevated.  His CPAP pressure was increased to 12 cm.   He saw Everlene Other, NP on 11/15/2021, concern he was compliant with his CPAP, AHI was little better but still suboptimal.  He was changed to AutoPap of 5 to 15 cm.     I saw him on 03/08/18, at which time he was compliant with BiPAP and doing well.    He saw Butch Penny, NP on 09/10/18, at which time he was doing well with his BiPAP.    He saw Butch Penny, NP on 12/24/19, at which time he was compliant, residual AHI was around 12/hour. His BiPAP was increased to 19/15 cm.    He had a virtual visit with [address]  [place] on 07/07/20, at which time he reported difficulty tolerating the BiPAP, he had not used it in about a week.  He also reported significant weight loss.  A home sleep test was ordered for reevaluation.     He had an interim home sleep test on 08/05/2020 which indicated mild residual sleep apnea with an AHI of 8.3/h, O2 nadir of 86%.  He was encouraged to try AutoPap therapy instead of BiPAP.   He was unable to start a new AutoPap machine, as he was not eligible yet.  He was advised to start a set pressure of 8 cm.     I reviewed his CPAP compliance data from 12/06/2020 through 01/04/2021, which is a total of 30  days, during which time he used his machine every night with percent use days greater than 4 hours at 100%, indicating superb compliance with average usage of 7 hours and 39 minutes, residual AHI highly elevated at 31.9/h, residual events appear to be obstructive in nature, pressure at 8 cm, leak acceptable with a 95th percentile at 20.9 L/min.    I saw him on 04/11/2017, at which time he was on CPAP of 13 cm with full compliance. He had an increased AHI of 21.7 per hour on a pressure of 13 cm. I suggested we increase his pressure to 15 cm at the time. He was seen in follow-up by Butch Penny on 10/09/2017, at which time he was compliant with his CPAP of 15 cm but had significant residual sleep disordered breathing. I suggested he return for a full night read titration study. He had a CPAP titration study on 12/17/2017. I went over his test results with him in detail today. CPAP was initiated at 6 cm and titrated to 13 cm. His AHI was elevated on 13 cm and he was therefore switched to BiPAP therapy at 14/10 and further titrated to 17/13. He had a reduced sleep efficiency at 55.3%, sleep latency was 24 minutes and REM latency was delayed at 139 minutes. He had an increased percentage of slow-wave sleep and a decreased percentage of REM sleep. On the final pressure of 17/13 his AHI was still elevated at 25.7, O2 nadir was 92%, non-REM sleep was achieved. I recommended a home treatment pressure of 18/14.    I reviewed his BiPAP compliance data from 02/03/2018 through 03/04/2018, which is a total of 30 days, during which time he used his BiPAP every night with percent used days greater than 4 hours at 100%, indicating superb compliance with an average usage of 8 hours and 2 minutes, residual AHI improved at 12.5 per hour, leak low with the 95th percentile at 1.6 L/m on a pressure of 18/14.  I saw him on 11/28/2016, at which time he reported doing well with CPAP therapy, had adjusted well to treatment and was  fully compliant. Due to his residual AHI of 28.7 per hour at the time I suggested we increase his CPAP pressure from 8 cm to 10 cm. He had an interim appointment with Everlene Other on 01/26/2017, at which time he was again compliant with CPAP therapy but residual AHI was still suboptimal at over 30 per hour. Symptoms were mostly obstructive in nature and I suggested we increase his pressure to 13 cm.    I reviewed his CPAP compliance data from 03/11/2017 through 04/09/2017 which is a total of 30 days, during which time he used his machine 100%, average usage of 7 hours and 38 minutes, residual AHI improved  but still elevated at 21.7 per hour, Mostly obstructive in nature, leak acceptable with the 95th percentile at 14.2 L/m on a pressure of 13 cm with EPR of 3. He reports doing well, tolerated the increase in pressure. Has had some numbness in the pinky fingers b/l, has a Hx of degenerative cervical spine d/s, s/p surgery in 76. Some R knee discomfort.  Using a Lg FFM, some leak, but overall okay, still feels he has done quite well with using CPAP in the sense that he feels improved, daytime energy better, headaches improved.   I first met him on 08/09/2016 at the request of his primary care physician, at which time he reported snoring and excessive daytime somnolence as well as worsening nocturia. I invited him for sleep study. He had a split-night sleep study on 08/21/2016. We went over his test results today. Baseline sleep efficiency was markedly reduced at 50.8%, wake after sleep onset was 83 minutes, REM latency was normal, sleep latency delayed. Total AHI was 28.8 per hour, REM AHI was 67.5 per hour, O2 nadir was 83%.   He was titrated on CPAP from 5 cm to 8 cm, AHI was 0 per hour on a pressure of 8 cm. He had severe PLMS during the baseline and the CPAP titration portion of the study with minimal to mild arousals.   I reviewed his CPAP compliance data from 10/29/2016 through 11/27/2016, which is a  total of 30 days, during which time he used his CPAP every night with percent used days greater than 4 hours at 100%, indicating superb compliance with an average usage of 8 hours and 11 minutes, residual AHI elevated at 28.7 per hour, mostly obstructive in nature, leak low with the 95th percentile at 7.2 L/m on a pressure of 8 cm with EPR of 3.   08/21/2016: He reports snoring and excessive daytime somnolence. His Epworth sleepiness score is 14 out of 24 today. Fatigue score is 15 out of 63. Snoring can be loud. He denies restless leg symptoms. He has had nocturia for years, worse in the past year with 3-4 bathroom visits per night. He has seen urologist for this was advised that his prostate looked fine. He quit smoking in 1973, drinks alcohol occasionally, and drinks about 3 cups of coffee per day but typically no sodas and decaf tea only. I reviewed your office note from 07/06/2016. 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 He tries to exercise regularly, and tries to drink enough water. He has gained weight in the last few years, especially after his retirement.  He does not watch TV in bed, lives with his wife, has 2 grown daughters, one in [place] and the other in [place], [place]. He suspects that his father may have had obstructive sleep apnea. He tries to keep a scheduled for his sleep time and wake up time. They have no pets at this time. He likes to travel since his retirement.    His Past Medical History Is Significant For: Past Medical History:  Diagnosis Date   Arthritis    hands   Bladder stone    BPH (benign prostatic hyperplasia)    Cancer (HCC)    skin cancer multiple; followed by [facility]/dermatology yearly.   Colon polyps     Previous polyps on colonoscopy.  [facility].     Hearing aid worn    both ears   Hiatal hernia 10/20/2013  EGD confirmed.  [facility].   History of Barrett's esophagus 2014   pt told by dr Ewing Schlein no  barrett's esophagus 03-03-2021   History of hypertension    no meds since 2019   History of prediabetes    non pre dm since june 2021 weight loss   Hyperlipemia    Neuromuscular disorder (HCC)    numbness rt hand   Seasonal allergies    Sleep apnea    uses CPAP nightly    His Past Surgical History Is Significant For: Past Surgical History:  Procedure Laterality Date   BLADDER STONE REMOVAL  2011, 2018   CARPAL TUNNEL RELEASE  01/17/2012   Procedure: CARPAL TUNNEL RELEASE;  Surgeon: [name]., MD;  Location: [facility];  Service: Orthopedics;  Laterality: Right;   CERVICAL FUSION  1976   COLONOSCOPY  10/20/2013   [facility].  Polyps.    CYSTOSCOPY WITH LITHOLAPAXY N/A 01/09/2017   Procedure: CYSTOSCOPY WITH LITHOLAPAXY;  Surgeon: Marcine Matar, MD;  Location: [facility];  Service: Urology;  Laterality: N/A;   ESOPHAGOGASTRODUODENOSCOPY  10/20/2013   [facility]; biopsy of area concernig for Barrett's; gastritis.   EYE SURGERY  2012   both cataracts   FRACTURE SURGERY Right 1960's   3 fingers   HOLMIUM LASER APPLICATION N/A 01/09/2017   Procedure: HOLMIUM LASER APPLICATION;  Surgeon: Marcine Matar, MD;  Location: [facility];  Service: Urology;  Laterality: N/A;   QUADRICEPS TENDON REPAIR  2003   bilateral   TRANSURETHRAL RESECTION OF PROSTATE N/A 03/08/2021   Procedure: TRANSURETHRAL RESECTION OF THE PROSTATE (TURP);  Surgeon: Marcine Matar, MD;  Location: [facility];  Service: Urology;  Laterality: N/A;   ULNAR NERVE TRANSPOSITION  01/17/2012   Procedure: ULNAR NERVE DECOMPRESSION/TRANSPOSITION;  Surgeon: [name]., MD;  Location: [facility];  Service: Orthopedics;  Laterality: Right;  decompression of ulnar nerve only  at right cubital tunnel   ULNAR NERVE TRANSPOSITION Left 06/01/2017   Procedure: DECOMPRESSION ULNAR NERVE LEFT CUBITAL TUNNEL;  Surgeon: Betha Loa, MD;  Location: [facility];  Service: Orthopedics;  Laterality: Left;   UPPER GASTROINTESTINAL ENDOSCOPY     VASECTOMY  yrs ago    His Family History Is Significant For: Family History  Problem Relation Age of Onset   Heart failure Mother        in 68s   Cirrhosis Father    Hypertension Father    Alcohol abuse Father    Sleep apnea Father    Arthritis Brother        Rheumatoid arthritis   Hypertension Brother    Neuropathy Neg Hx     His Social History Is Significant For: Social History   Socioeconomic History   Marital status: Married    Spouse name: Reagan Sass   Number of children: 2   Years of education: 12   Highest education level: Not on file  Occupational History   Occupation: Retired  Tobacco Use   Smoking status: Former    Packs/day: 2.00    Years: 15.00    Additional pack years: 0.00    Total pack years: 30.00    Types: Cigarettes    Quit date: 01/11/1974    Years since quitting: 49.2   Smokeless tobacco: Never  Vaping Use   Vaping Use: Never used  Substance and Sexual Activity   Alcohol use: Yes    Comment: occ wine   Drug use: No  Sexual activity: Not Currently  Other Topics Concern   Not on file  Social History Narrative   Marital status:  Married x 42 years      Children: 2 daughters; 3 grandchildren.      Employment; retired in 1998 from [employer]; retired age 20 from [employer] after 14 years.      Lives: with wife.      Tobacco: former smoker; quit at age 23.  Smoked 15 years.      Alcohol:  On vacation or special occasions.        Education: [employer].       Exercise: Gym 3-4 times a week for 2 hours.; exercises 7 days per week.   Elliptical for one hour.      Advanced Directives: none; +desires FULL CODE.        ADLs: independent with ADLs; drives.  Does not walk with assistant devices.     Drinks 3-4 caffeine drinks a day    Social Determinants of Health   Financial Resource Strain: Low Risk  (12/21/2022)   Overall  Financial Resource Strain (CARDIA)    Difficulty of Paying Living Expenses: Not hard at all  Food Insecurity: No Food Insecurity (12/21/2022)   Hunger Vital Sign    Worried About Running Out of Food in the Last Year: Never true    Ran Out of Food in the Last Year: Never true  Transportation Needs: No Transportation Needs (12/21/2022)   PRAPARE - Administrator, Civil Service (Medical): No    Lack of Transportation (Non-Medical): No  Physical Activity: Sufficiently Active (12/21/2022)   Exercise Vital Sign    Days of Exercise per Week: 7 days    Minutes of Exercise per Session: 30 min  Stress: No Stress Concern Present (12/21/2022)   [employer] of Occupational Health - Occupational Stress Questionnaire    Feeling of Stress : Not at all  Social Connections: Socially Integrated (12/21/2022)   Social Connection and Isolation Panel [NHANES]    Frequency of Communication with Friends and Family: More than three times a week    Frequency of Social Gatherings with Friends and Family: Three times a week    Attends Religious Services: More than 4 times per year    Active Member of Clubs or Organizations: Yes    Attends Banker Meetings: More than 4 times per year    Marital Status: Married    His Allergies Are:  Allergies  Allergen Reactions   Shellfish Allergy     Headaches, high blood pressure   Ace Inhibitors Cough   Amoxicillin Nausea And Vomiting   Darvon     Chest pain   Toprol Xl [Metoprolol Tartrate]     Not sure reaction  :   His Current Medications Are:  Outpatient Encounter Medications as of 03/30/2023  Medication Sig   Aspirin-Caffeine (BC FAST PAIN RELIEF ARTHRITIS PO) Take by mouth.   Black Pepper-Turmeric (TURMERIC CURCUMIN) 03-999 MG CAPS Take by mouth.   collodion LIQD 2 mLs as needed. Capsule   diclofenac Sodium (VOLTAREN) 1 % GEL Apply 4 g topically 4 (four) times daily. (Patient taking differently: Apply 4 g topically as needed.)    Digestive Enzymes (PAPAYA AND ENZYMES PO) Take by mouth.   fish oil-omega-3 fatty acids 1000 MG capsule daily. 2000 mg BID   fluticasone (FLONASE) 50 MCG/ACT nasal spray 1 spray Once Daily.   GARLIC PO Take 409 mg by mouth 2 (two)  times daily. 2 tabs bid per pt   glucosamine-chondroitin 500-400 MG tablet Take by mouth.   Multiple Vitamin (MULTIVITAMIN) tablet Take 1 tablet by mouth daily.   nystatin-triamcinolone (MYCOLOG II) cream Apply topically 4 (four) times daily.   pravastatin (PRAVACHOL) 20 MG tablet TAKE 1 TABLET BY MOUTH EVERY DAY CAN START FEW DAYS PER WEEK AND INCREASE IF TOLERATED   psyllium (METAMUCIL) 58.6 % powder Take 1 packet by mouth daily.   acetaminophen (TYLENOL) 500 MG tablet Take 1,000 mg by mouth every 6 (six) hours as needed.   cetirizine (ZYRTEC) 10 MG tablet Take by mouth.   Saw Palmetto 160 MG CAPS Take by mouth.   No facility-administered encounter medications on file as of 03/30/2023.  :  Review of Systems:  Out of a complete 14 point review of systems, all are reviewed and negative with the exception of these symptoms as listed below:  Review of Systems  Neurological:        Pt here for CPAP f/u Pt states not doing well with pap machine Pt states not sleeping well at night Pt states having a lot of events during the night    ESS:9    Objective:  Neurological Exam  Physical Exam Physical Examination:   Vitals:   03/30/23 0725  BP: 129/75  Pulse: 89    General Examination: The patient is a very pleasant 82 y.o. male in no acute distress. He appears well-developed and well-nourished and well groomed.   HEENT: Normocephalic, atraumatic, pupils are equal, round and reactive to light, status post cataract repairs. Extraocular tracking is well preserved, hearing grossly intact, hearing aids.  Face is symmetric with normal facial animation.  Speech is clear without dysarthria, hypophonia or voice tremor.  Neck is supple with full range of motion, no  carotid bruits. Airway examination reveals mild mouth dryness, stable findings with moderate airway crowding, tongue protrudes centrally and palate elevates symmetrically.     Chest: Clear to auscultation without wheezing, rhonchi or crackles noted.   Heart: S1+S2+0, regular and normal without murmurs, rubs or gallops noted.    Abdomen: Soft, non-tender and non-distended.   Extremities: There is no pitting edema in the distal lower extremities bilaterally.  Good pedal pulses.   Skin: Warm and dry without trophic changes noted. There are no varicose veins. Chronic sun exposure type changes.   Musculoskeletal: exam reveals prominent arthritic changes in both hands.  He has limited range of motion in both shoulders, left more so than right.  Unremarkable surgical scars both knees from tendon repair surgeries some 20 years ago.   Neurologically:  Mental status: The patient is awake, alert and oriented in all 4 spheres. His immediate and remote memory, attention, language skills and fund of knowledge are appropriate. There is no evidence of aphasia, agnosia, apraxia or anomia. Speech is clear with normal prosody and enunciation. Thought process is linear. Mood is normal and affect is normal.  Cranial nerves II - XII are as described above under HEENT exam. Motor exam: Normal bulk, strength and tone is noted. There is no resting or action tremor. Fine motor skills and coordination: grossly intact.   Cerebellar testing: No dysmetria or intention tremor. There is no truncal or gait ataxia.   Sensory exam: intact to light touch.   Gait, station and balance: He stands with mild difficulty, walks without a walking aid.       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  He is set up date on this machine was 01/30/2023.  He has a ResMed air sense 10 AutoSet machine.  He is on AutoPap of 5-16 but has trouble tolerating the pressure.  Mask seal is good generally speaking, residual AHI elevated around 24/h.  We mutually agreed to change his settings back to CPAP of 13 cm which he was able to tolerate better before he received this new machine.  His residual AHI was elevated but not quite as high on CPAP of 13 cm.  We can review a download in the next month or 2.  He is advised to follow-up routinely to see Everlene Other, NP in about 6 months, we can cancel July's appointment.  As far as his lower extremity paresthesias, symptoms are stable, he has numbness and tingling but no pain.  We mutually agreed to continue to monitor, we have previously done quite a bit of workup in the form of blood work last year and EMG and nerve conduction velocity testing.  He continues to stay active.  He has not had any recent falls thankfully. I answered all his questions today and he was in agreement with our plan.  I spent 30 minutes in total face-to-face time and in reviewing records during pre-charting, more than 50% of which was spent in counseling and coordination of care, reviewing test results, reviewing medications and treatment regimen and/or in discussing or reviewing the diagnosis of OSA, paresthesias, the prognosis and treatment options. Pertinent laboratory and imaging test results that were available during this visit with the patient were reviewed by me and considered in my medical decision making (see chart for details).

## 2023-03-30 NOTE — Patient Instructions (Addendum)
We will change you back to CPAP, and keep you on the pressure of 13 cm which was your previous pressure.  We can check your download in a month or 2 remotely, please call or email [place] through MyChart to remind [place].  You can follow up with Megan in 6 months.

## 2023-04-04 NOTE — Progress Notes (Signed)
New, Doristine Mango, RN; Milon Dikes; Marveen Reeks Received, Thank you!     Previous Messages    ----- Message ----- From: Guy Begin, RN Sent: 03/30/2023   8:35 AM EDT To: Henderson Newcomer; Kathyrn Sheriff; Santina Evans; * Subject: change autopap to cpap                        New order in EPIC for pt  Change from autopap to CPAP of 13 with EPR 3  Adrienne C. Holyfield Male, 82 y.o., 02-23-1941 MRN: [id number]  Thank you  Andrey Campanile RN

## 2023-04-12 ENCOUNTER — Other Ambulatory Visit: Payer: Self-pay | Admitting: Family Medicine

## 2023-04-12 DIAGNOSIS — E785 Hyperlipidemia, unspecified: Secondary | ICD-10-CM

## 2023-05-16 ENCOUNTER — Ambulatory Visit: Payer: Medicare Other | Admitting: Neurology

## 2023-05-24 ENCOUNTER — Other Ambulatory Visit: Payer: Self-pay | Admitting: Urology

## 2023-05-29 ENCOUNTER — Ambulatory Visit: Payer: Medicare Other | Admitting: Cardiovascular Disease

## 2023-05-29 ENCOUNTER — Encounter: Payer: Self-pay | Admitting: Cardiovascular Disease

## 2023-05-29 VITALS — BP 138/78 | HR 77 | Ht 71.0 in | Wt 216.0 lb

## 2023-05-29 DIAGNOSIS — Z136 Encounter for screening for cardiovascular disorders: Secondary | ICD-10-CM

## 2023-05-29 DIAGNOSIS — E782 Mixed hyperlipidemia: Secondary | ICD-10-CM

## 2023-05-29 NOTE — Patient Instructions (Signed)
Medication Instructions:  No changes *If you need a refill on your cardiac medications before your next appointment, please call your pharmacy*   Lab Work: none If you have labs (blood work) drawn today and your tests are completely normal, you will receive your results only by: MyChart Message (if you have MyChart) OR A paper copy in the mail If you have any lab test that is abnormal or we need to change your treatment, we will call you to review the results.   Testing/Procedures: none   Follow-Up: At [facility], you and your health needs are our priority.  As part of our continuing mission to provide you with exceptional heart care, we have created designated Provider Care Teams.  These Care Teams include your primary Cardiologist (physician) and Advanced Practice Providers (APPs -  Physician Assistants and Nurse Practitioners) who all work together to provide you with the care you need, when you need it.   Your next appointment:   2 year(s)  Provider:   Verne Carrow, MD

## 2023-05-29 NOTE — Progress Notes (Signed)
Chief Complaint  Patient presents with   New Patient (Initial Visit)    Cardiac risk assessment    History of Present Illness: 82 yo male with history of HTN, HLD, BPH, sleep apnea, hiatal hernia and Barrett's esophagus who is here today as a new patient to re-establish cardiology care. I saw him in 2014 for evaluation of chest pain. Echo and nuclear stress were normal in 2014. He tells me today that he feels well. He patient denies any chest pain, dyspnea, palpitations, lower extremity edema, orthopnea, PND, dizziness, near syncope or syncope. Recent screening (Vascu-screen) showed normal carotid arteries, normal ABI and no evidence of AAA.   Primary Care Physician: Shade Flood, MD   Past Medical History:  Diagnosis Date   Arthritis    hands   Bladder stone    BPH (benign prostatic hyperplasia)    Cancer (HCC)    skin cancer multiple; followed by [facility]/dermatology yearly.   Colon polyps     Previous polyps on colonoscopy.  [facility].     Hearing aid worn    both ears   Hiatal hernia 10/20/2013   EGD confirmed.  [facility].   History of Barrett's esophagus 2014   pt told by dr Ewing Schlein no barrett's esophagus 03-03-2021   History of hypertension    no meds since 2019   History of prediabetes    non pre dm since june 2021 weight loss   Hyperlipemia    Neuromuscular disorder (HCC)    numbness rt hand   Seasonal allergies    Sleep apnea    uses CPAP nightly    Past Surgical History:  Procedure Laterality Date   BLADDER STONE REMOVAL  2011, 2018   CARPAL TUNNEL RELEASE  01/17/2012   Procedure: CARPAL TUNNEL RELEASE;  Surgeon: [name]., MD;  Location: [facility];  Service: Orthopedics;  Laterality: Right;   CERVICAL FUSION  1976   COLONOSCOPY  10/20/2013   [facility].  Polyps.    CYSTOSCOPY WITH LITHOLAPAXY N/A 01/09/2017   Procedure: CYSTOSCOPY WITH LITHOLAPAXY;  Surgeon: Marcine Matar, MD;  Location: [facility];  Service: Urology;   Laterality: N/A;   ESOPHAGOGASTRODUODENOSCOPY  10/20/2013   [facility]; biopsy of area concernig for Barrett's; gastritis.   EYE SURGERY  2012   both cataracts   FRACTURE SURGERY Right 1960's   3 fingers   HOLMIUM LASER APPLICATION N/A 01/09/2017   Procedure: HOLMIUM LASER APPLICATION;  Surgeon: Marcine Matar, MD;  Location: [facility];  Service: Urology;  Laterality: N/A;   QUADRICEPS TENDON REPAIR  2003   bilateral   TRANSURETHRAL RESECTION OF PROSTATE N/A 03/08/2021   Procedure: TRANSURETHRAL RESECTION OF THE PROSTATE (TURP);  Surgeon: Marcine Matar, MD;  Location: [facility];  Service: Urology;  Laterality: N/A;   ULNAR NERVE TRANSPOSITION  01/17/2012   Procedure: ULNAR NERVE DECOMPRESSION/TRANSPOSITION;  Surgeon: [name]., MD;  Location: [facility];  Service: Orthopedics;  Laterality: Right;  decompression of ulnar nerve only  at right cubital tunnel   ULNAR NERVE TRANSPOSITION Left 06/01/2017   Procedure: DECOMPRESSION ULNAR NERVE LEFT CUBITAL TUNNEL;  Surgeon: Betha Loa, MD;  Location: [facility];  Service: Orthopedics;  Laterality: Left;   UPPER GASTROINTESTINAL ENDOSCOPY     VASECTOMY  yrs ago    Current Outpatient Medications  Medication Sig Dispense Refill   acetaminophen (TYLENOL) 500 MG tablet Take 1,000 mg by mouth every 6 (six) hours as  needed.     Aspirin-Caffeine (BC FAST PAIN RELIEF ARTHRITIS PO) Take by mouth.     Black Pepper-Turmeric (TURMERIC CURCUMIN) 03-999 MG CAPS Take by mouth.     collodion LIQD 2 mLs as needed. Capsule     Digestive Enzymes (PAPAYA AND ENZYMES PO) Take by mouth.     fish oil-omega-3 fatty acids 1000 MG capsule daily. 2000 mg BID     GARLIC PO Take 161 mg by mouth 2 (two) times daily. 2 tabs bid per pt     Multiple Vitamin (MULTIVITAMIN) tablet Take 1 tablet by mouth daily.     nystatin-triamcinolone (MYCOLOG II) cream Apply topically 4 (four) times daily.     pravastatin  (PRAVACHOL) 20 MG tablet Take 20 mg by mouth daily.     psyllium (METAMUCIL) 58.6 % powder Take 1 packet by mouth daily.     Saw Palmetto 160 MG CAPS Take by mouth.     No current facility-administered medications for this visit.    Allergies  Allergen Reactions   Shellfish Allergy     Headaches, high blood pressure   Ace Inhibitors Cough   Amoxicillin Nausea And Vomiting   Darvon     Chest pain   Toprol Xl [Metoprolol Tartrate]     Not sure reaction    Social History   Socioeconomic History   Marital status: Married    Spouse name: Amy Dejoy   Number of children: 2   Years of education: 12   Highest education level: Not on file  Occupational History   Occupation: Retired  Tobacco Use   Smoking status: Former    Current packs/day: 0.00    Average packs/day: 2.0 packs/day for 15.0 years (30.0 ttl pk-yrs)    Types: Cigarettes    Start date: 01/12/1959    Quit date: 01/11/1974    Years since quitting: 49.4   Smokeless tobacco: Never  Vaping Use   Vaping status: Never Used  Substance and Sexual Activity   Alcohol use: Yes    Comment: occ wine   Drug use: No   Sexual activity: Not Currently  Other Topics Concern   Not on file  Social History Narrative   Marital status:  Married x 42 years      Children: 2 daughters; 3 grandchildren.      Employment; retired in 1998 from [employer]; retired age 54 from [employer] after 14 years.      Lives: with wife.      Tobacco: former smoker; quit at age 35.  Smoked 15 years.      Alcohol:  On vacation or special occasions.        Education: [employer].       Exercise: Gym 3-4 times a week for 2 hours.; exercises 7 days per week.   Elliptical for one hour.      Advanced Directives: none; +desires FULL CODE.        ADLs: independent with ADLs; drives.  Does not walk with assistant devices.     Drinks 3-4 caffeine drinks a day    Social Determinants of Health   Financial Resource Strain: Low Risk  (12/21/2022)    Overall Financial Resource Strain (CARDIA)    Difficulty of Paying Living Expenses: Not hard at all  Food Insecurity: No Food Insecurity (12/21/2022)   Hunger Vital Sign    Worried About Running Out of Food in the Last Year: Never true    Ran Out of Food in the  Last Year: Never true  Transportation Needs: No Transportation Needs (12/21/2022)   PRAPARE - Administrator, Civil Service (Medical): No    Lack of Transportation (Non-Medical): No  Physical Activity: Sufficiently Active (12/21/2022)   Exercise Vital Sign    Days of Exercise per Week: 7 days    Minutes of Exercise per Session: 30 min  Stress: No Stress Concern Present (12/21/2022)   [employer] of Occupational Health - Occupational Stress Questionnaire    Feeling of Stress : Not at all  Social Connections: Socially Integrated (12/21/2022)   Social Connection and Isolation Panel [NHANES]    Frequency of Communication with Friends and Family: More than three times a week    Frequency of Social Gatherings with Friends and Family: Three times a week    Attends Religious Services: More than 4 times per year    Active Member of Clubs or Organizations: Yes    Attends Banker Meetings: More than 4 times per year    Marital Status: Married  Catering manager Violence: Not At Risk (12/21/2022)   Humiliation, Afraid, Rape, and Kick questionnaire    Fear of Current or Ex-Partner: No    Emotionally Abused: No    Physically Abused: No    Sexually Abused: No    Family History  Problem Relation Age of Onset   Heart failure Mother        in 57s   Cirrhosis Father    Hypertension Father    Alcohol abuse Father    Sleep apnea Father    Arthritis Brother        Rheumatoid arthritis   Hypertension Brother    Neuropathy Neg Hx     Review of Systems:  As stated in the HPI and otherwise negative.   BP 138/78   Pulse 77   Ht 5\' 11"  (1.803 m)   Wt 98 kg   SpO2 95%   BMI 30.13 kg/m   Physical  Examination: General: Well developed, well nourished, NAD  HEENT: OP clear, mucus membranes moist  SKIN: warm, dry. No rashes. Neuro: No focal deficits  Musculoskeletal: Muscle strength 5/5 all ext  Psychiatric: Mood and affect normal  Neck: No JVD, no carotid bruits, no thyromegaly, no lymphadenopathy.  Lungs:Clear bilaterally, no wheezes, rhonci, crackles Cardiovascular: Regular rate and rhythm. No murmurs, gallops or rubs. Abdomen:Soft. Bowel sounds present. Non-tender.  Extremities: No lower extremity edema. Pulses are 2 + in the bilateral DP/PT.  EKG:  EKG is ordered today. The ekg ordered today demonstrates  EKG Interpretation Date/Time:  Monday May 29 2023 14:47:34 EDT Ventricular Rate:  77 PR Interval:  196 QRS Duration:  96 QT Interval:  394 QTC Calculation: 445 R Axis:   -4  Text Interpretation: Normal sinus rhythm Normal ECG When compared with ECG of 29-May-2017 10:09, No significant change was found Confirmed by Verne Carrow [phone]) on 05/29/2023 2:55:51 PM    Recent Labs: 03/20/2023: ALT 23; BUN 20; Creatinine, Ser 0.86; Potassium 3.8; Sodium 137   Lipid Panel    Component Value Date/Time   CHOL 177 03/20/2023 1047   CHOL 195 10/26/2020 1141   TRIG 256.0 (H) 03/20/2023 1047   HDL 43.30 03/20/2023 1047   HDL 53 10/26/2020 1141   CHOLHDL 4 03/20/2023 1047   VLDL 51.2 (H) 03/20/2023 1047   LDLCALC 75 09/12/2022 0959   LDLCALC 123 (H) 10/26/2020 1141   LDLDIRECT 107.0 03/20/2023 1047     Wt Readings from Last 3 Encounters:  05/29/23 98 kg  03/30/23 98.5 kg  03/20/23 97.5 kg    Assessment and Plan:   1. Cardiac risk assessment: He has no chest pain, dyspnea or palpitations. EKG is normal. Recent vascu-screen testing with normal carotid arteries, normal ABI and no evidence of AAA. He is very active. I do not think further cardiac testing is indicated at this time.   Labs/ tests ordered today include:   Orders Placed This Encounter  Procedures    EKG 12-Lead   Disposition:   F/U with me in 2-3 years per pt request.    Signed, Verne Carrow, MD, [facility] 05/29/2023 3:11 PM    [facility] [address] [place], [place], [place]  [address] Phone: [phone]; Fax: [phone]

## 2023-06-20 NOTE — Patient Instructions (Signed)
DUE TO COVID-19 ONLY TWO VISITORS  (aged 80 and older)  ARE ALLOWED TO COME WITH YOU AND STAY IN THE WAITING ROOM ONLY DURING PRE OP AND PROCEDURE.   **NO VISITORS ARE ALLOWED IN THE SHORT STAY AREA OR RECOVERY ROOM!!**  IF YOU WILL BE ADMITTED INTO THE HOSPITAL YOU ARE ALLOWED ONLY FOUR SUPPORT PEOPLE DURING VISITATION HOURS ONLY (7 AM -8PM)   The support person(s) must pass our screening, gel in and out, and wear a mask at all times, including in the patient's room. Patients must also wear a mask when staff or their support person are in the room. Visitors GUEST BADGE MUST BE WORN VISIBLY  One adult visitor may remain with you overnight and MUST be in the room by 8 P.M.     Your procedure is scheduled on: 07/05/23   Report to [facility] Main Entrance    Report to admitting at : 11:00 AM   Call this number if you have problems the morning of surgery [phone]   Do not eat food :After Midnight.   After Midnight you may have the following liquids until : 10:00 AM DAY OF SURGERY  Water Black Coffee (sugar ok, NO MILK/CREAM OR CREAMERS)  Tea (sugar ok, NO MILK/CREAM OR CREAMERS) regular and decaf                             Plain Jell-O (NO RED)                                           Fruit ices (not with fruit pulp, NO RED)                                     Popsicles (NO RED)                                                                  Juice: apple, WHITE grape, WHITE cranberry Sports drinks like Gatorade (NO RED)            FOLLOW ANY ADDITIONAL PRE OP INSTRUCTIONS YOU RECEIVED FROM YOUR SURGEON'S OFFICE!!!   Oral Hygiene is also important to reduce your risk of infection.                                    Remember - BRUSH YOUR TEETH THE MORNING OF SURGERY WITH YOUR REGULAR TOOTHPASTE  DENTURES WILL BE REMOVED PRIOR TO SURGERY PLEASE DO NOT APPLY "Poly grip" OR ADHESIVES!!!   Do NOT smoke after Midnight   Take these medicines the morning of surgery with A  SIP OF WATER: N/A  Bring CPAP mask and tubing day of surgery.                              You may not have any metal on your body including hair pins, jewelry, and body piercing  Do not wear lotions, powders, perfumes/cologne, or deodorant              Men may shave face and neck.   Do not bring valuables to the hospital. [facility] IS NOT             RESPONSIBLE   FOR VALUABLES.   Contacts, glasses, or bridgework may not be worn into surgery.   Bring small overnight bag day of surgery.   DO NOT BRING YOUR HOME MEDICATIONS TO THE HOSPITAL. PHARMACY WILL DISPENSE MEDICATIONS LISTED ON YOUR MEDICATION LIST TO YOU DURING YOUR ADMISSION IN THE HOSPITAL!    Patients discharged on the day of surgery will not be allowed to drive home.  Someone NEEDS to stay with you for the first 24 hours after anesthesia.   Special Instructions: Bring a copy of your healthcare power of attorney and living will documents         the day of surgery if you haven't scanned them before.              Please read over the following fact sheets you were given: IF YOU HAVE QUESTIONS ABOUT YOUR PRE-OP INSTRUCTIONS PLEASE CALL [phone]    [facility] - Preparing for Surgery Before surgery, you can play an important role.  Because skin is not sterile, your skin needs to be as free of germs as possible.  You can reduce the number of germs on your skin by washing with CHG (chlorahexidine gluconate) soap before surgery.  CHG is an antiseptic cleaner which kills germs and bonds with the skin to continue killing germs even after washing. Please DO NOT use if you have an allergy to CHG or antibacterial soaps.  If your skin becomes reddened/irritated stop using the CHG and inform your nurse when you arrive at Short Stay. Do not shave (including legs and underarms) for at least 48 hours prior to the first CHG shower.  You may shave your face/neck. Please follow these instructions carefully:  1.  Shower with CHG  Soap the night before surgery and the  morning of Surgery.  2.  If you choose to wash your hair, wash your hair first as usual with your  normal  shampoo.  3.  After you shampoo, rinse your hair and body thoroughly to remove the  shampoo.                           4.  Use CHG as you would any other liquid soap.  You can apply chg directly  to the skin and wash                       Gently with a scrungie or clean washcloth.  5.  Apply the CHG Soap to your body ONLY FROM THE NECK DOWN.   Do not use on face/ open                           Wound or open sores. Avoid contact with eyes, ears mouth and genitals (private parts).                       Wash face,  Genitals (private parts) with your normal soap.             6.  Wash thoroughly, paying special attention to the area where your surgery  will be performed.  7.  Thoroughly rinse your body with warm water from the neck down.  8.  DO NOT shower/wash with your normal soap after using and rinsing off  the CHG Soap.                9.  Pat yourself dry with a clean towel.            10.  Wear clean pajamas.            11.  Place clean sheets on your bed the night of your first shower and do not  sleep with pets. Day of Surgery : Do not apply any lotions/deodorants the morning of surgery.  Please wear clean clothes to the hospital/surgery center.  FAILURE TO FOLLOW THESE INSTRUCTIONS MAY RESULT IN THE CANCELLATION OF YOUR SURGERY PATIENT SIGNATURE_________________________________  NURSE SIGNATURE__________________________________  ________________________________________________________________________

## 2023-06-22 ENCOUNTER — Other Ambulatory Visit: Payer: Self-pay

## 2023-06-22 ENCOUNTER — Encounter (HOSPITAL_COMMUNITY): Payer: Self-pay

## 2023-06-22 ENCOUNTER — Encounter (HOSPITAL_COMMUNITY)
Admission: RE | Admit: 2023-06-22 | Discharge: 2023-06-22 | Disposition: A | Discharge status: Home / Self Care | Payer: Medicare Other | Source: Ambulatory Visit | Attending: Urology | Admitting: Urology

## 2023-06-22 VITALS — BP 145/73 | HR 102 | Temp 97.5°F | Ht 71.0 in | Wt 216.0 lb

## 2023-06-22 DIAGNOSIS — Z01812 Encounter for preprocedural laboratory examination: Secondary | ICD-10-CM | POA: Diagnosis not present

## 2023-06-22 DIAGNOSIS — Z01818 Encounter for other preprocedural examination: Secondary | ICD-10-CM | POA: Diagnosis present

## 2023-06-22 HISTORY — DX: Personal history of urinary calculi: Z87.442

## 2023-06-22 LAB — CBC
HCT: 43 % (ref 39.0–52.0)
Hemoglobin: 14.6 g/dL (ref 13.0–17.0)
MCH: 29.8 pg (ref 26.0–34.0)
MCHC: 34 g/dL (ref 30.0–36.0)
MCV: 87.8 fL (ref 80.0–100.0)
Platelets: 226 10*3/uL (ref 150–400)
RBC: 4.9 MIL/uL (ref 4.22–5.81)
RDW: 13.4 % (ref 11.5–15.5)
WBC: 4.6 10*3/uL (ref 4.0–10.5)
nRBC: 0 % (ref 0.0–0.2)

## 2023-06-22 NOTE — Progress Notes (Signed)
For Short Stay: COVID SWAB appointment date:  Bowel Prep reminder:   For Anesthesia: PCP - Dr. Meredith Staggers. Cardiologist - Dr. Verne Carrow. LOV: 05/29/23  Chest x-ray -  EKG - 05/29/23 Stress Test -  ECHO - 03/15/13 Cardiac Cath -  Pacemaker/ICD device last checked: Pacemaker orders received: Device Rep notified:  Spinal Cord Stimulator: N/A  Sleep Study - Yes CPAP - Yes  Fasting Blood Sugar - N/A Checks Blood Sugar ___0__ times a day Date and result of last Hgb A1c- 6.0: 03/20/23  Last dose of GLP1 agonist- N/A GLP1 instructions:   Last dose of SGLT-2 inhibitors- N/A SGLT-2 instructions:   Blood Thinner Instructions:  Aspirin Instructions: To hold it a week before surgery. Last Dose:  Activity level: Can go up a flight of stairs and activities of daily living without stopping and without chest pain and/or shortness of breath   Able to exercise without chest pain and/or shortness of breath  Anesthesia review: Hx: OSA(CPAP)  Patient denies shortness of breath, fever, cough and chest pain at PAT appointment   Patient verbalized understanding of instructions that were given to them at the PAT appointment. Patient was also instructed that they will need to review over the PAT instructions again at home before surgery.

## 2023-07-05 ENCOUNTER — Ambulatory Visit (HOSPITAL_COMMUNITY): Payer: Medicare Other

## 2023-07-05 ENCOUNTER — Ambulatory Visit (HOSPITAL_BASED_OUTPATIENT_CLINIC_OR_DEPARTMENT_OTHER): Payer: Medicare Other | Admitting: Anesthesiology

## 2023-07-05 ENCOUNTER — Ambulatory Visit (HOSPITAL_COMMUNITY)
Admission: RE | Admit: 2023-07-05 | Discharge: 2023-07-05 | Disposition: A | Discharge status: Home / Self Care | Payer: Medicare Other | Source: Ambulatory Visit | Attending: Urology | Admitting: Urology

## 2023-07-05 ENCOUNTER — Ambulatory Visit (HOSPITAL_COMMUNITY): Payer: Medicare Other | Admitting: Anesthesiology

## 2023-07-05 ENCOUNTER — Encounter (HOSPITAL_COMMUNITY): Payer: Self-pay | Admitting: Urology

## 2023-07-05 ENCOUNTER — Encounter (HOSPITAL_COMMUNITY)
Admission: RE | Disposition: A | Discharge status: Home / Self Care | Payer: Self-pay | Source: Ambulatory Visit | Attending: Urology

## 2023-07-05 ENCOUNTER — Other Ambulatory Visit: Payer: Self-pay

## 2023-07-05 DIAGNOSIS — N210 Calculus in bladder: Secondary | ICD-10-CM | POA: Diagnosis not present

## 2023-07-05 DIAGNOSIS — Z87891 Personal history of nicotine dependence: Secondary | ICD-10-CM | POA: Insufficient documentation

## 2023-07-05 DIAGNOSIS — G709 Myoneural disorder, unspecified: Secondary | ICD-10-CM | POA: Diagnosis not present

## 2023-07-05 DIAGNOSIS — G4733 Obstructive sleep apnea (adult) (pediatric): Secondary | ICD-10-CM | POA: Insufficient documentation

## 2023-07-05 DIAGNOSIS — N400 Enlarged prostate without lower urinary tract symptoms: Secondary | ICD-10-CM | POA: Insufficient documentation

## 2023-07-05 DIAGNOSIS — Z87442 Personal history of urinary calculi: Secondary | ICD-10-CM | POA: Diagnosis not present

## 2023-07-05 DIAGNOSIS — Z85828 Personal history of other malignant neoplasm of skin: Secondary | ICD-10-CM | POA: Diagnosis not present

## 2023-07-05 DIAGNOSIS — I10 Essential (primary) hypertension: Secondary | ICD-10-CM | POA: Insufficient documentation

## 2023-07-05 DIAGNOSIS — N200 Calculus of kidney: Secondary | ICD-10-CM | POA: Diagnosis not present

## 2023-07-05 DIAGNOSIS — Z9079 Acquired absence of other genital organ(s): Secondary | ICD-10-CM | POA: Insufficient documentation

## 2023-07-05 DIAGNOSIS — N2889 Other specified disorders of kidney and ureter: Secondary | ICD-10-CM | POA: Insufficient documentation

## 2023-07-05 HISTORY — PX: CYSTOSCOPY WITH RETROGRADE PYELOGRAM, URETEROSCOPY AND STENT PLACEMENT: SHX5789

## 2023-07-05 HISTORY — PX: CYSTOSCOPY WITH LITHOLAPAXY: SHX1425

## 2023-07-05 HISTORY — PX: HOLMIUM LASER APPLICATION: SHX5852

## 2023-07-05 SURGERY — CYSTOURETEROSCOPY, WITH RETROGRADE PYELOGRAM AND STENT INSERTION
Anesthesia: General | Laterality: Right

## 2023-07-05 MED ORDER — FENTANYL CITRATE (PF) 100 MCG/2ML IJ SOLN
INTRAMUSCULAR | Status: DC | PRN
  Administered 2023-07-05 (×4): 25 ug via INTRAVENOUS

## 2023-07-05 MED ORDER — PHENYLEPHRINE 80 MCG/ML (10ML) SYRINGE FOR IV PUSH (FOR BLOOD PRESSURE SUPPORT)
PREFILLED_SYRINGE | INTRAVENOUS | Status: AC
  Filled 2023-07-05: qty 10

## 2023-07-05 MED ORDER — GENTAMICIN SULFATE 40 MG/ML IJ SOLN
5.0000 mg/kg | INTRAVENOUS | Status: AC
  Administered 2023-07-05: 420 mg via INTRAVENOUS
  Filled 2023-07-05: qty 10.5

## 2023-07-05 MED ORDER — CHLORHEXIDINE GLUCONATE 0.12 % MT SOLN
15.0000 mL | Freq: Once | OROMUCOSAL | Status: AC
  Administered 2023-07-05: 15 mL via OROMUCOSAL

## 2023-07-05 MED ORDER — HYDROCODONE-ACETAMINOPHEN 5-325 MG PO TABS: 1.0000 | ORAL_TABLET | ORAL | 0 refills | Status: DC | PRN

## 2023-07-05 MED ORDER — LIDOCAINE HCL (PF) 2 % IJ SOLN
INTRAMUSCULAR | Status: AC
  Filled 2023-07-05: qty 5

## 2023-07-05 MED ORDER — OXYCODONE HCL 5 MG/5ML PO SOLN: 5.0000 mg | Freq: Once | ORAL | Status: DC | PRN

## 2023-07-05 MED ORDER — PHENYLEPHRINE HCL-NACL 20-0.9 MG/250ML-% IV SOLN
INTRAVENOUS | Status: DC | PRN
Start: 2023-07-05 — End: 2023-07-05
  Administered 2023-07-05: 50 ug/min via INTRAVENOUS

## 2023-07-05 MED ORDER — FENTANYL CITRATE (PF) 100 MCG/2ML IJ SOLN
INTRAMUSCULAR | Status: AC
  Filled 2023-07-05: qty 2

## 2023-07-05 MED ORDER — ONDANSETRON HCL 4 MG/2ML IJ SOLN
INTRAMUSCULAR | Status: DC | PRN
  Administered 2023-07-05: 4 mg via INTRAVENOUS

## 2023-07-05 MED ORDER — OXYCODONE HCL 5 MG PO TABS: 5.0000 mg | ORAL_TABLET | Freq: Once | ORAL | Status: DC | PRN

## 2023-07-05 MED ORDER — NITROFURANTOIN MONOHYD MACRO 100 MG PO CAPS: 100.0000 mg | ORAL_CAPSULE | Freq: Two times a day (BID) | ORAL | 0 refills | Status: AC

## 2023-07-05 MED ORDER — PROMETHAZINE HCL 25 MG/ML IJ SOLN: 6.2500 mg | INTRAMUSCULAR | Status: DC | PRN

## 2023-07-05 MED ORDER — LIDOCAINE 2% (20 MG/ML) 5 ML SYRINGE
INTRAMUSCULAR | Status: DC | PRN
  Administered 2023-07-05: 60 mg via INTRAVENOUS

## 2023-07-05 MED ORDER — ORAL CARE MOUTH RINSE: 15.0000 mL | Freq: Once | OROMUCOSAL | Status: AC

## 2023-07-05 MED ORDER — ONDANSETRON HCL 4 MG/2ML IJ SOLN
INTRAMUSCULAR | Status: AC
  Filled 2023-07-05: qty 2

## 2023-07-05 MED ORDER — AMISULPRIDE (ANTIEMETIC) 5 MG/2ML IV SOLN: 10.0000 mg | Freq: Once | INTRAVENOUS | Status: DC | PRN

## 2023-07-05 MED ORDER — PHENYLEPHRINE 80 MCG/ML (10ML) SYRINGE FOR IV PUSH (FOR BLOOD PRESSURE SUPPORT)
PREFILLED_SYRINGE | INTRAVENOUS | Status: DC | PRN
  Administered 2023-07-05 (×5): 160 ug via INTRAVENOUS

## 2023-07-05 MED ORDER — KETOROLAC TROMETHAMINE 10 MG PO TABS: 10.0000 mg | ORAL_TABLET | Freq: Three times a day (TID) | ORAL | 0 refills | Status: DC | PRN

## 2023-07-05 MED ORDER — LACTATED RINGERS IV SOLN: INTRAVENOUS | Status: DC

## 2023-07-05 MED ORDER — HYDROMORPHONE HCL 1 MG/ML IJ SOLN: 0.2500 mg | INTRAMUSCULAR | Status: DC | PRN

## 2023-07-05 MED ORDER — IOHEXOL 300 MG/ML  SOLN
INTRAMUSCULAR | Status: DC | PRN
  Administered 2023-07-05: 19 mL via URETHRAL

## 2023-07-05 MED ORDER — DEXAMETHASONE SODIUM PHOSPHATE 10 MG/ML IJ SOLN
INTRAMUSCULAR | Status: AC
  Filled 2023-07-05: qty 1

## 2023-07-05 MED ORDER — DEXAMETHASONE SODIUM PHOSPHATE 10 MG/ML IJ SOLN
INTRAMUSCULAR | Status: DC | PRN
  Administered 2023-07-05: 10 mg via INTRAVENOUS

## 2023-07-05 MED ORDER — PROPOFOL 10 MG/ML IV BOLUS
INTRAVENOUS | Status: AC
  Filled 2023-07-05: qty 20

## 2023-07-05 MED ORDER — SENNOSIDES-DOCUSATE SODIUM 8.6-50 MG PO TABS: 1.0000 | ORAL_TABLET | Freq: Two times a day (BID) | ORAL | 0 refills | Status: DC

## 2023-07-05 MED ORDER — SODIUM CHLORIDE 0.9 % IR SOLN
Status: DC | PRN
  Administered 2023-07-05: 24000 mL via INTRAVESICAL

## 2023-07-05 MED ORDER — PROPOFOL 10 MG/ML IV BOLUS
INTRAVENOUS | Status: DC | PRN
  Administered 2023-07-05: 150 mg via INTRAVENOUS

## 2023-07-05 SURGICAL SUPPLY — 30 items
BAG COUNTER SPONGE SURGICOUNT (BAG) IMPLANT
BAG SPNG CNTER NS LX DISP (BAG)
BAG URO CATCHER STRL LF (MISCELLANEOUS) ×2 IMPLANT
BASKET LASER NITINOL 1.9FR (BASKET) IMPLANT
BSKT STON RTRVL 120 1.9FR (BASKET) ×2
CATH URETL OPEN END 6FR 70 (CATHETERS) ×2 IMPLANT
CLOTH BEACON ORANGE TIMEOUT ST (SAFETY) ×2 IMPLANT
EXTRACTOR STONE 1.7FRX115CM (UROLOGICAL SUPPLIES) IMPLANT
GLOVE SURG LX STRL 7.5 STRW (GLOVE) ×2 IMPLANT
GOWN STRL REUS W/ TWL XL LVL3 (GOWN DISPOSABLE) ×2 IMPLANT
GOWN STRL REUS W/TWL XL LVL3 (GOWN DISPOSABLE) ×6
GUIDEWIRE ANG ZIPWIRE 038X150 (WIRE) ×2 IMPLANT
GUIDEWIRE STR DUAL SENSOR (WIRE) ×2 IMPLANT
KIT TURNOVER KIT A (KITS) IMPLANT
LASER FIB FLEXIVA PULSE ID 365 (Laser) IMPLANT
LASER FIB FLEXIVA PULSE ID 550 (Laser) ×2 IMPLANT
LASER FIB FLEXIVA PULSE ID 910 (Laser) ×2 IMPLANT
LOOP CUT BIPOLAR 24F LRG (ELECTROSURGICAL) IMPLANT
MANIFOLD NEPTUNE II (INSTRUMENTS) ×2 IMPLANT
PACK CYSTO (CUSTOM PROCEDURE TRAY) ×2 IMPLANT
SHEATH NAVIGATOR HD 11/13X28 (SHEATH) IMPLANT
SHEATH NAVIGATOR HD 11/13X36 (SHEATH) IMPLANT
STENT POLARIS 5FRX26 (STENTS) IMPLANT
SYR TOOMEY IRRIG 70ML (MISCELLANEOUS)
SYRINGE TOOMEY IRRIG 70ML (MISCELLANEOUS) IMPLANT
TRACTIP FLEXIVA PULS ID 200XHI (Laser) IMPLANT
TRACTIP FLEXIVA PULSE ID 200 (Laser) ×2
TUBE PU 8FR 16IN ENFIT (TUBING) ×2 IMPLANT
TUBING CONNECTING 10 (TUBING) ×2 IMPLANT
TUBING UROLOGY SET (TUBING) ×2 IMPLANT

## 2023-07-05 NOTE — Anesthesia Procedure Notes (Signed)
Procedure Name: LMA Insertion Date/Time: 07/05/2023 12:24 PM  Performed by: Florene Route, CRNAPatient Re-evaluated:Patient Re-evaluated prior to induction Oxygen Delivery Method: Circle system utilized Preoxygenation: Pre-oxygenation with 100% oxygen Induction Type: IV induction LMA: LMA inserted LMA Size: 4.0 Number of attempts: 1 Placement Confirmation: positive ETCO2 and breath sounds checked- equal and bilateral Tube secured with: Tape Dental Injury: Teeth and Oropharynx as per pre-operative assessment

## 2023-07-05 NOTE — Anesthesia Preprocedure Evaluation (Signed)
Anesthesia Evaluation  Patient identified by MRN, date of birth, ID band Patient awake    Reviewed: Allergy & Precautions, H&P , NPO status , Patient's Chart, lab work & pertinent test results  Airway Mallampati: II  TM Distance: >3 FB Neck ROM: Full    Dental no notable dental hx. (+) Teeth Intact, Caps, Dental Advisory Given,    Pulmonary sleep apnea and Continuous Positive Airway Pressure Ventilation , former smoker   Pulmonary exam normal breath sounds clear to auscultation       Cardiovascular Exercise Tolerance: Good negative cardio ROS Normal cardiovascular exam Rhythm:Regular Rate:Normal     Neuro/Psych negative neurological ROS  negative psych ROS   GI/Hepatic negative GI ROS, Neg liver ROS,,,  Endo/Other  negative endocrine ROS    Renal/GU Renal disease  negative genitourinary   Musculoskeletal  (+) Arthritis , Osteoarthritis,    Abdominal  (+) + obese  Peds negative pediatric ROS (+)  Hematology negative hematology ROS (+)   Anesthesia Other Findings   Reproductive/Obstetrics negative OB ROS                             Anesthesia Physical Anesthesia Plan  ASA: 2  Anesthesia Plan: General   Post-op Pain Management:    Induction: Intravenous  PONV Risk Score and Plan: 2 and Ondansetron, Midazolam and Treatment may vary due to age or medical condition  Airway Management Planned: LMA  Additional Equipment: None  Intra-op Plan:   Post-operative Plan: Extubation in OR  Informed Consent: I have reviewed the patients History and Physical, chart, labs and discussed the procedure including the risks, benefits and alternatives for the proposed anesthesia with the patient or authorized representative who has indicated his/her understanding and acceptance.     Dental advisory given  Plan Discussed with: CRNA  Anesthesia Plan Comments:         Anesthesia Quick  Evaluation

## 2023-07-05 NOTE — Brief Op Note (Signed)
07/05/2023  2:23 PM  PATIENT:  Todd [name]  83 y.o. male  PRE-OPERATIVE DIAGNOSIS:  RENAL STONE, BLADDER STONE  POST-OPERATIVE DIAGNOSIS:  RIGHT RENAL STONE, BLADDER STONE  PROCEDURE:  Procedure(s): CYSTOSCOPY WITH RETROGRADE PYELOGRAM, URETEROSCOPY AND STENT PLACEMENT, LASER INCISION OF URETEROCELE (Right) CYSTOSCOPY WITH LITHOLAPAXY, LASER OF BLADDER STONE (N/A) HOLMIUM LASER APPLICATION (Right)  SURGEON:  Surgeons and Role:    * Vickki Igou, Delbert Phenix., MD - Primary  PHYSICIAN ASSISTANT:   ASSISTANTS: none   ANESTHESIA:   general  EBL:  minimal   BLOOD ADMINISTERED:none  DRAINS: none   LOCAL MEDICATIONS USED:  NONE  SPECIMEN:  Source of Specimen:  Rt ureteral / Bladder/ureterocele stone fragments  DISPOSITION OF SPECIMEN:   given to pt  COUNTS:  YES  TOURNIQUET:  * No tourniquets in log *  DICTATION: .Other Dictation: Dictation Number [id number]  PLAN OF CARE: Discharge to home after PACU  PATIENT DISPOSITION:  PACU - hemodynamically stable.   Delay start of Pharmacological VTE agent (>24hrs) due to surgical blood loss or risk of bleeding: yes

## 2023-07-05 NOTE — H&P (Signed)
Todd [name] is an 82 y.o. male.    Chief Complaint: Pre-OP Cystolithalopexy and RT Ureteroscopic stone manipulation  HPI:   1 - Lower Urinary Tract Symptoms - s/p TURP 03/2021 by Todd [name]. Path benign.   2 - Multifocal Urolithiasis - 1.8cm bladder v. left ureterocele stone and 9mm Rt renal stone on CT 2024. These are new since imagign 2019. No hydro.   PMH sig for OSA/CPAP, PreDM (A1c 6), ortho surgery. 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 Enjoys travellign with his wife sometimes internationally. Walks and excercises daily. His PCP is Todd Sacramento MD.   Today "Todd [name]" is seen to proceed with cystolithalopexy and Rt ureteroscopy for multifocal urolithiasis. UCX staph (?contaminant).    Past Medical History:  Diagnosis Date   Arthritis    hands   Bladder stone    BPH (benign prostatic hyperplasia)    Cancer (HCC)    skin cancer multiple; followed by Todd [name]/dermatology yearly.   Colon polyps     Previous polyps on colonoscopy.  [facility].     Hearing aid worn    both ears   Hiatal hernia 10/20/2013   EGD confirmed.  [facility].   History of Barrett's esophagus 2014   pt told by dr Todd [name] no barrett's esophagus 03-03-2021   History of hypertension    no meds since 2019   History of kidney stones    History of prediabetes    non pre dm since june 2021 weight loss   Hyperlipemia    Neuromuscular disorder (HCC)    numbness rt hand   Seasonal allergies    Sleep apnea    uses CPAP nightly    Past Surgical History:  Procedure Laterality Date   BLADDER STONE REMOVAL  2011, 2018   CARPAL TUNNEL RELEASE  01/17/2012   Procedure: CARPAL TUNNEL RELEASE;  Surgeon: Todd [name]., MD;  Location: [facility];  Service: Orthopedics;  Laterality: Right;   CERVICAL FUSION  1976   COLONOSCOPY  10/20/2013   [facility].  Polyps.    CYSTOSCOPY WITH LITHOLAPAXY N/A 01/09/2017   Procedure: CYSTOSCOPY WITH LITHOLAPAXY;  Surgeon: Todd Matar, MD;  Location: [facility];  Service: Urology;  Laterality: N/A;   ESOPHAGOGASTRODUODENOSCOPY  10/20/2013   [facility]; biopsy of area concernig for Barrett's; gastritis.   EYE SURGERY  2012   both cataracts   FRACTURE SURGERY Right 1960's   3 fingers   HOLMIUM LASER APPLICATION N/A 01/09/2017   Procedure: HOLMIUM LASER APPLICATION;  Surgeon: Todd Matar, MD;  Location: [facility];  Service: Urology;  Laterality: N/A;   QUADRICEPS TENDON REPAIR  2003   bilateral   TRANSURETHRAL RESECTION OF PROSTATE N/A 03/08/2021   Procedure: TRANSURETHRAL RESECTION OF THE PROSTATE (TURP);  Surgeon: Todd Matar, MD;  Location: [facility];  Service: Urology;  Laterality: N/A;   ULNAR NERVE TRANSPOSITION  01/17/2012   Procedure: ULNAR NERVE DECOMPRESSION/TRANSPOSITION;  Surgeon: Todd [name]., MD;  Location: [facility];  Service: Orthopedics;  Laterality: Right;  decompression of ulnar nerve only  at right cubital tunnel   ULNAR NERVE TRANSPOSITION Left 06/01/2017   Procedure: DECOMPRESSION ULNAR NERVE LEFT CUBITAL TUNNEL;  Surgeon: Betha Loa, MD;  Location: [facility];  Service: Orthopedics;  Laterality: Left;   UPPER GASTROINTESTINAL ENDOSCOPY     VASECTOMY  yrs ago    Family History  Problem Relation Age of Onset   Heart failure Mother  in 85s   Cirrhosis Father    Hypertension Father    Alcohol abuse Father    Sleep apnea Father    Arthritis Brother        Rheumatoid arthritis   Hypertension Brother    Neuropathy Neg Hx    Social History:  reports that he quit smoking about 49 years ago. His smoking use included cigarettes. He started smoking about 64 years ago. He has a 30 pack-year smoking history. He has never used smokeless tobacco. He reports current alcohol use. He reports that he does not use drugs.  Allergies:  Allergies  Allergen Reactions   Shellfish Allergy Other (See Comments)    Headaches, high blood pressure    Todd Inhibitors Cough   Amoxicillin Nausea And Vomiting   Darvon     Chest pain   Toprol Xl [Metoprolol Tartrate] Other (See Comments)    Not sure reaction    No medications prior to admission.    No results found for this or any previous visit (from the past 48 hour(s)). No results found.  Review of Systems  Constitutional:  Negative for chills and fever.  Genitourinary:  Positive for flank pain and hematuria.  All other systems reviewed and are negative.   There were no vitals taken for this visit. Physical Exam Vitals reviewed.  HENT:     Head: Normocephalic.     Nose: Nose normal.  Eyes:     Pupils: Pupils are equal, round, and reactive to light.  Cardiovascular:     Rate and Rhythm: Normal rate.  Abdominal:     General: Abdomen is flat.  Genitourinary:    Comments: No CVAT at present Musculoskeletal:        General: Normal range of motion.     Cervical back: Normal range of motion.  Neurological:     General: No focal deficit present.     Mental Status: He is alert.  Psychiatric:        Mood and Affect: Mood normal.      Assessment/Plan  Proceed as planned with cystolithalopexy, / ureteroscopy.   Loletta Parish., MD 07/05/2023, 6:58 AM

## 2023-07-05 NOTE — Discharge Instructions (Signed)
1 - You may have urinary urgency (bladder spasms) and bloody urine on / off with stent in place. This is normal. ° °2 - Call MD or go to ER for fever >102, severe pain / nausea / vomiting not relieved by medications, or acute change in medical status ° °

## 2023-07-05 NOTE — Op Note (Signed)
Todd [name], Todd [name] MEDICAL RECORD NO: [id number] ACCOUNT NO: [id number] DATE OF BIRTH: 10/04/1941 FACILITY: [facility] LOCATION: [facility] PHYSICIAN: Sebastian Ache, MD  Operative Report   DATE OF PROCEDURE: 07/05/2023  PREOPERATIVE DIAGNOSIS:  Right renal stone, left bladder stone versus stone in the ureterocele.  POSTOPERATIVE DIAGNOSIS: Right renal stone very large, left ureterocele stone greater than 2 cm.  PROCEDURE PERFORMED:   1.  Cystoscopy with right retrograde pyelogram interpretation. 2.  Right ureteroscopy with laser lithotripsy. 3. Insertion of right ureteral stent. 4.  Incision of left ureterocele. 5.  Cystolitholapaxy stone greater than 2 cm.  ESTIMATED BLOOD LOSS:  Nil.  COMPLICATIONS:  None.  SPECIMEN:  Stone fragments given to patient.  FINDINGS:   1.  Right intrarenal stone with hydronephrosis. 2.  Complete resolution of all accessible stone fragments larger than one-third mm following laser lithotripsy on the right side. 3.  Successful placement of right ureteral stent, proximal end in the renal pelvis, distal end in urinary bladder.  No tether. 4.  Large left ureterocele containing approximately 2.5 cm stone. 5.  Widely patent left ureteral orifice following incision of ureterocele.  INDICATIONS:  The patient is an 82 year old man with history of recurrent urolithiasis.  He was found on workup of flank pain and occasional hematuria to have a right intrarenal stone and a very large left distal ureteral stone in ureterocele versus  bladder stone on CT imaging.  Options were discussed for management including recommended path of endoscopic lithotripsy with goal of stone free and he presents for this today.  Informed consent was obtained and placed in medical record.  PROCEDURE:  The patient being identified and verified, procedure being right ureteroscopic stone manipulation, and incision of ureterocele and/or cystolitholapaxy was confirmed.  Procedure timeout was  performed.  Intravenous antibiotics were  administered.  General anesthesia was induced.  The patient was placed into a low lithotomy position.  Sterile field was created, prepped and draped the patient's penis, perineum, and proximal thighs using iodine.  Cystourethroscopy was performed using  21-French rigid cystoscope with offset lens.  Inspection of anterior and posterior urethra was unremarkable.  Inspection of urinary bladder revealed unremarkable right ureteral orifice, however, the left ureteral orifice was completely obscured by a very  large mounding of tissue consistent with a very large ureterocele with stone.  There was no stone free floating in the bladder. Decision was made to address the right sided renal stone first. The right ureteral orifice was cannulated with 6-French  end-hole catheter, and right retrograde pyelogram was obtained.  Right retrograde pyelogram demonstrated single right ureter, single system right kidney.  A 0.038 ZIPwire was advanced to the level of the upper poles set aside as a safety wire.  An 8-French feeding tube placed in the urinary bladder for pressure  release and semirigid ureteroscopy was performed of the distal orifice right ureter alongside as a separate sensor working wire.  Semi-rigid scope was then exchanged for a medium length ureteral access sheath to the level of proximal ureter using  continuous fluoroscopic guidance and flexible digital ureteroscopy was performed of the proximal right ureter and systematic inspection of the right kidney, including all calices x2.  There was a dominant stone in a midpole calix as anticipated.  This  was too large for simple basketing.  As such, holmium laser energy was applied to stone using settings of 0.2 joules and 40 Hz and approximately 80% of the stone was dusted 20% fragmented with fragments being amenable to simple  basketing. Overall stone  volume was at least 8 mm.  Access sheath was removed under continuous  vision, no significant mucosal abnormalities were found.  Attention was then directed at the addressing the ureterocele. Initial ureterocele incision was made with holmium laser energy  using settings of 1 joule and 10 Hz and a linear incision was made.  This immediately effluxed proteinaceous but nonpurulent appearing urine and a very large stone was seen within the ureterocele.  Initially lithotripsy was performed in its native  location.  Stone was at least 2.5 cm and approximately one-third of the stone volume was addressed this way.  Given angulation with the incision I was not able to perorm additional  lithotripsy with this technique.  As such, we used the resectoscope loop and additional wall of the  ureterocele was resected allowing the stone to be dislodged to position it into the deep in the bladder and the ureterocele edges were resected down flush to the bladder and the left ureteral orifice was widely patent, so large that it could easily  accommodate the cystoscope.  The stone now in the dependent bladder.  Additional laser lithotripsy was applied to stone using a 550 nanometer fiber at settings of 1 joule and 10 Hz.  The stone was completely fragmented into pieces that were amenable to  irrigation via the resectoscope sheath.  Following this, complete resolution of all bladder stone fragments, which again was previously in the ureterocele.  The left ureteral orifice was visibly widely patent.  Given large amount of endoscopic  manipulation, it was felt that interval stenting with nontethered stent would be most prudent.  As such, a new 5 x 26 Polaris type stent was carefully placed over the remaining safety wire using fluoroscopic guidance. Good proximal and distal planes were  noted.  Bladder was partially emptied per cystoscope.  Procedure was then terminated.  The patient tolerated procedure well, no immediate periprocedural complications.  The patient taken to postanesthesia care unit in  stable condition.  Plan for  discharge home after void.   PUS D: 07/05/2023 2:30:45 pm T: 07/05/2023 5:05:00 pm  JOB: [id number]/ [id number]

## 2023-07-05 NOTE — Transfer of Care (Signed)
Immediate Anesthesia Transfer of Care Note  Patient: Todd [name]  Procedure(s) Performed: CYSTOSCOPY WITH RETROGRADE PYELOGRAM, URETEROSCOPY AND STENT PLACEMENT, LASER INCISION OF URETEROCELE (Right) CYSTOSCOPY WITH LITHOLAPAXY, LASER OF BLADDER STONE HOLMIUM LASER APPLICATION (Right)  Patient Location: PACU  Anesthesia Type:General  Level of Consciousness: drowsy  Airway & Oxygen Therapy: Patient Spontanous Breathing and Patient connected to face mask oxygen  Post-op Assessment: Report given to RN and Post -op Vital signs reviewed and stable  Post vital signs: Reviewed and stable  Last Vitals:  Vitals Value Taken Time  BP 122/73 07/05/23 1435  Temp    Pulse 75 07/05/23 1437  Resp 15 07/05/23 1437  SpO2 100 % 07/05/23 1437  Vitals shown include unfiled device data.  Last Pain:  Vitals:   07/05/23 1057  TempSrc: Oral         Complications: No notable events documented.

## 2023-07-06 ENCOUNTER — Encounter (HOSPITAL_COMMUNITY): Payer: Self-pay | Admitting: Urology

## 2023-07-06 NOTE — Anesthesia Postprocedure Evaluation (Signed)
Anesthesia Post Note  Patient: Todd [name]  Procedure(s) Performed: CYSTOSCOPY WITH RETROGRADE PYELOGRAM, URETEROSCOPY AND STENT PLACEMENT, LASER INCISION OF URETEROCELE (Right) CYSTOSCOPY WITH LITHOLAPAXY, LASER OF BLADDER STONE HOLMIUM LASER APPLICATION (Right)     Patient location during evaluation: PACU Anesthesia Type: General Level of consciousness: awake and alert Pain management: pain level controlled Vital Signs Assessment: post-procedure vital signs reviewed and stable Respiratory status: spontaneous breathing, nonlabored ventilation and respiratory function stable Cardiovascular status: blood pressure returned to baseline and stable Postop Assessment: no apparent nausea or vomiting Anesthetic complications: no   No notable events documented.  Last Vitals:  Vitals:   07/05/23 1500 07/05/23 1515  BP: 118/85 116/70  Pulse: 75 75  Resp: 19 15  Temp:  36.9 C  SpO2: 96% 95%    Last Pain:  Vitals:   07/05/23 1515  TempSrc:   PainSc: 0-No pain                 Lowella Curb

## 2023-08-27 ENCOUNTER — Emergency Department (HOSPITAL_BASED_OUTPATIENT_CLINIC_OR_DEPARTMENT_OTHER)
Admission: EM | Admit: 2023-08-27 | Discharge: 2023-08-27 | Disposition: A | Discharge status: Home / Self Care | Payer: Medicare Other | Attending: Emergency Medicine | Admitting: Emergency Medicine

## 2023-08-27 ENCOUNTER — Encounter (HOSPITAL_BASED_OUTPATIENT_CLINIC_OR_DEPARTMENT_OTHER): Payer: Self-pay | Admitting: Emergency Medicine

## 2023-08-27 ENCOUNTER — Other Ambulatory Visit: Payer: Self-pay

## 2023-08-27 DIAGNOSIS — R519 Headache, unspecified: Secondary | ICD-10-CM | POA: Insufficient documentation

## 2023-08-27 DIAGNOSIS — R070 Pain in throat: Secondary | ICD-10-CM | POA: Diagnosis not present

## 2023-08-27 DIAGNOSIS — H9393 Unspecified disorder of ear, bilateral: Secondary | ICD-10-CM | POA: Diagnosis present

## 2023-08-27 DIAGNOSIS — H938X3 Other specified disorders of ear, bilateral: Secondary | ICD-10-CM

## 2023-08-27 NOTE — Discharge Instructions (Signed)
Exam today is reassuring.  I recommend you use a sinus rinse as discussed.  Information regarding this attached.  Follow-up with your primary care provider.  For any concerning symptoms such as worsening headache, weakness, or other concerning symptoms return to the emergency room.

## 2023-08-27 NOTE — ED Provider Notes (Signed)
[place] EMERGENCY DEPARTMENT AT [facility] Provider Note   CSN: [id number] Arrival date & time: 08/27/23  3664     History  Chief Complaint  Patient presents with   Ear Fullness    Todd [name] is a 82 y.o. male.  82 year old male presents today for concern of ear fullness.  He denies any other upper respiratory symptoms such as any significant congestion, cough, rhinorrhea, or pharyngitis.  Denies any fever.  States he has a mild headache on the right side of his forehead but he has been having this intermittently for over a year.  Denies any balance issues, paresthesias.  He states his left ear has a sensation of fullness chronically but now also has the right ear involved.  No difficulty swallowing, but notes mild discomfort in his throat.  He states typically he has discomfort on the left side but now he has noticed bilateral discomfort.   Denies chest pain, shortness of breath.  No significant medical history.  Only takes pravastatin daily.  The history is provided by the patient. No language interpreter was used.       Home Medications Prior to Admission medications   Medication Sig Start Date End Date Taking? Authorizing Provider  Digestive Enzymes (PAPAYA AND ENZYMES PO) Take 7 tablets by mouth in the morning and at bedtime. With breakfast and Supper    [provider]  GARLIC PO Take 4,034 mg by mouth in the morning.    [provider]  HYDROcodone-acetaminophen (NORCO/VICODIN) 5-325 MG tablet Take 1 tablet by mouth every 4 (four) hours as needed for severe pain (post-operatively). 07/05/23 07/04/24  Loletta Parish., MD  ketorolac (TORADOL) 10 MG tablet Take 1 tablet (10 mg total) by mouth every 8 (eight) hours as needed for moderate pain (or stent discomfort post-operatively). 07/05/23   Loletta Parish., MD  Multiple Vitamin (MULTIVITAMIN) tablet Take 1 tablet by mouth in the morning.    [provider]  NON FORMULARY Take 2  tablets by mouth in the morning. Super Beets Chewables    [provider]  OMEGA-3 FATTY ACIDS PO Take 2,000 mg by mouth in the morning.    [provider]  pravastatin (PRAVACHOL) 20 MG tablet Take 20 mg by mouth at bedtime.    [provider]  psyllium (METAMUCIL) 58.6 % powder Take 1 packet by mouth in the morning.    [provider]  senna-docusate (SENOKOT-S) 8.6-50 MG tablet Take 1 tablet by mouth 2 (two) times daily. While taking strongest pain med to prevent constipation 07/05/23   Berneice Heinrich Delbert Phenix., MD  Specialty Vitamins Products (BRAIN PO) Take 1 capsule by mouth in the morning.    [provider]  TURMERIC CURCUMIN PO Take 3 capsules by mouth in the morning.    [provider]      Allergies    Shellfish allergy, Ace inhibitors, Amoxicillin, Darvon, and Toprol xl [metoprolol tartrate]    Review of Systems   Review of Systems  Constitutional:  Negative for chills and fever.  HENT:  Negative for congestion, postnasal drip, sore throat and trouble swallowing.   Eyes:  Negative for photophobia and visual disturbance.  Respiratory:  Negative for cough and shortness of breath.   Cardiovascular:  Negative for chest pain.  Neurological:  Positive for headaches. Negative for light-headedness.  All other systems reviewed and are negative.   Physical Exam Updated Vital Signs BP 136/83 (BP Location: Left Arm)  Pulse 83   Temp 97.7 F (36.5 C) (Oral)   Resp 18   Ht 5\' 11"  (1.803 m)   Wt 91.6 kg   SpO2 98%   BMI 28.17 kg/m  Physical Exam Vitals and nursing note reviewed.  Constitutional:      General: He is not in acute distress.    Appearance: Normal appearance. He is not ill-appearing.  HENT:     Head: Normocephalic and atraumatic.     Right Ear: Tympanic membrane, ear canal and external ear normal. There is no impacted cerumen.     Left Ear: Tympanic membrane, ear canal and external ear normal. There is no impacted  cerumen.     Nose: Nose normal.     Mouth/Throat:     Mouth: Mucous membranes are moist.     Pharynx: No oropharyngeal exudate or posterior oropharyngeal erythema.     Comments: No evidence of retropharyngeal abscess, peritonsillar abscess, Ludwig's angina. Eyes:     Conjunctiva/sclera: Conjunctivae normal.  Cardiovascular:     Rate and Rhythm: Normal rate.  Pulmonary:     Effort: Pulmonary effort is normal. No respiratory distress.  Musculoskeletal:        General: No deformity. Normal range of motion.     Cervical back: Normal range of motion.  Skin:    Findings: No rash.  Neurological:     General: No focal deficit present.     Mental Status: He is alert and oriented to person, place, and time. Mental status is at baseline.     Cranial Nerves: No cranial nerve deficit.     ED Results / Procedures / Treatments   Labs (all labs ordered are listed, but only abnormal results are displayed) Labs Reviewed - No data to display  EKG None  Radiology No results found.  Procedures Procedures    Medications Ordered in ED Medications - No data to display  ED Course/ Medical Decision Making/ A&P                                 Medical Decision Making  82 year old male presents today for concern of ear fullness.  Also endorses some pharyngitis.  On exam there is no exudates or pharyngeal erythema.  No tonsillar swelling.  Ear exam is without otitis media, otitis externa, without cerumen impaction, or obvious fluid buildup.  However he is noted to be clearing his throat on exam.  Query if he has postnasal drip from sinus congestion especially considering he is having some pharyngitis.  Discussed using a sinus rinse.  Otherwise he is hemodynamically stable.  No significant medical history.  No significant headache or neurodeficits.  Discussed with attending.  Low suspicion for intracranial bleed given the atypical presentation.  Will discharge patient with return precautions.   Discussed follow-up with his PCP.  He voices understanding and is in agreement with plan.   Final Clinical Impression(s) / ED Diagnoses Final diagnoses:  Sensation of fullness in both ears    Rx / DC Orders ED Discharge Orders     None         Marita Kansas, PA-C 08/27/23 1043    Alvira Monday, MD 08/28/23 2245

## 2023-08-27 NOTE — ED Triage Notes (Signed)
Pt arrived caox4, ambulatory, NAD c/o bilateral "ear fullness" x4 days along with intermittent discomfort in throat depending on positioning, denies pain while swallowing. Pt states he always has the ear fullness in the L side but it is bilateral now. Pt also c/o sinus headache but states he gets those regularly. Uses Afrin daily. Denies fever/chills, N/V/D, cough, congestion.

## 2023-09-21 ENCOUNTER — Encounter: Payer: Self-pay | Admitting: Family Medicine

## 2023-09-21 ENCOUNTER — Ambulatory Visit (INDEPENDENT_AMBULATORY_CARE_PROVIDER_SITE_OTHER)
Admission: RE | Admit: 2023-09-21 | Discharge: 2023-09-21 | Disposition: A | Discharge status: Home / Self Care | Payer: Medicare Other | Source: Ambulatory Visit | Attending: Family Medicine | Admitting: Family Medicine

## 2023-09-21 ENCOUNTER — Ambulatory Visit: Payer: Medicare Other | Admitting: Family Medicine

## 2023-09-21 VITALS — BP 116/80 | HR 76 | Temp 98.3°F | Ht 69.5 in | Wt 213.0 lb

## 2023-09-21 DIAGNOSIS — R7303 Prediabetes: Secondary | ICD-10-CM

## 2023-09-21 DIAGNOSIS — E782 Mixed hyperlipidemia: Secondary | ICD-10-CM

## 2023-09-21 DIAGNOSIS — R0789 Other chest pain: Secondary | ICD-10-CM

## 2023-09-21 DIAGNOSIS — K1370 Unspecified lesions of oral mucosa: Secondary | ICD-10-CM

## 2023-09-21 DIAGNOSIS — M159 Polyosteoarthritis, unspecified: Secondary | ICD-10-CM

## 2023-09-21 LAB — COMPREHENSIVE METABOLIC PANEL
ALT: 24 U/L (ref 0–53)
AST: 32 U/L (ref 0–37)
Albumin: 4.7 g/dL (ref 3.5–5.2)
Alkaline Phosphatase: 102 U/L (ref 39–117)
BUN: 16 mg/dL (ref 6–23)
CO2: 31 meq/L (ref 19–32)
Calcium: 9.5 mg/dL (ref 8.4–10.5)
Chloride: 100 meq/L (ref 96–112)
Creatinine, Ser: 0.98 mg/dL (ref 0.40–1.50)
GFR: 72.69 mL/min (ref >60.00)
Glucose, Bld: 99 mg/dL (ref 70–99)
Potassium: 4.1 meq/L (ref 3.5–5.1)
Sodium: 137 meq/L (ref 135–145)
Total Bilirubin: 1 mg/dL (ref 0.2–1.2)
Total Protein: 7.5 g/dL (ref 6.0–8.3)

## 2023-09-21 LAB — LIPID PANEL
Cholesterol: 174 mg/dL (ref 0–200)
HDL: 46.2 mg/dL (ref >39.00)
LDL Cholesterol: 91 mg/dL (ref 0–99)
NonHDL: 128.04
Total CHOL/HDL Ratio: 4
Triglycerides: 184 mg/dL — ABNORMAL HIGH (ref 0.0–149.0)
VLDL: 36.8 mg/dL (ref 0.0–40.0)

## 2023-09-21 LAB — HEMOGLOBIN A1C: Hgb A1c MFr Bld: 6 % (ref 4.6–6.5)

## 2023-09-21 NOTE — Progress Notes (Signed)
Subjective:  Patient ID: Todd [name], male    DOB: Sep 28, 1941  Age: 82 y.o. MRN: [id number]  CC:  Chief Complaint  Patient presents with   Medical Management of Chronic Issues    Pt notes his arthritis is getting worse wonders how best to manage this   Mouth Lesions    Pt notes a sore spot on his upper lip that's been there for about a year with intermittent pain, sees dentist next week     HPI Todd [name] presents for  Concerns above and other concern. Changed from physical to acute visit today.   Arthritis: Both ankles, shoulders, hands, all over. No limitation in activity. Soreness. Worse in am.  No treatments other than a few BC or Tylenol.   R chest wall pain: After fall - loose brick few weeks ago, fell and landed with R chest wall to ground. Still sore on R chest wall at times. Sore today. No cough/dyspnea/hemoptysis. No bruising. No head injury or other injuries. Tx: ibuprofen.   Lip lesion: Inside left upper lip - past year or so. No drainage, no pain. No changes - looks like cyst. Same size. Dental appt next week.   Prediabetes: Diet/exercise approach.  Has exercise daily including some resistance training in the past. Still exercising. Avoiding sodas.  Lab Results  Component Value Date   HGBA1C 6.0 03/20/2023   Wt Readings from Last 3 Encounters:  09/21/23 213 lb (96.6 kg)  08/27/23 202 lb (91.6 kg)  07/05/23 216 lb (98 kg)   Hyperlipidemia: Pravastatin 20 mg daily. Borderline elevated blood pressure on previous visits. Seen by cardiology July 22 for cardiac risk assessment.  Normal EKG.  No further cardiac testing was indicated. No new myalgias, chronic arthritis pain as above.  BP Readings from Last 3 Encounters:  09/21/23 116/80  08/27/23 136/83  07/05/23 116/70   Lab Results  Component Value Date   CHOL 177 03/20/2023   HDL 43.30 03/20/2023   LDLCALC 75 09/12/2022   LDLDIRECT 107.0 03/20/2023   TRIG 256.0 (H) 03/20/2023   CHOLHDL 4 03/20/2023    Lab Results  Component Value Date   ALT 23 03/20/2023   AST 28 03/20/2023   ALKPHOS 83 03/20/2023   BILITOT 0.7 03/20/2023      History Patient Active Problem List   Diagnosis Date Noted   Enlarged prostate with urinary obstruction 03/08/2021   Prediabetes 03/07/2019   Bladder stone 03/20/2017   Nephrolithiasis 03/20/2017   Sensorineural hearing loss (SNHL) of both ears 03/20/2017   OSA on CPAP 02/21/2017   Primary osteoarthritis of both hands 03/01/2016   Mixed hyperlipidemia 03/01/2016   Seasonal allergic rhinitis due to pollen 03/01/2016   BMI 31.0-31.9,adult 03/01/2016   Obesity 03/01/2016   Angioedema 02/16/2013   Past Medical History:  Diagnosis Date   Arthritis    hands   Bladder stone    BPH (benign prostatic hyperplasia)    Cancer (HCC)    skin cancer multiple; followed by [facility]/dermatology yearly.   Colon polyps     Previous polyps on colonoscopy.  [facility].     Hearing aid worn    both ears   Hiatal hernia 10/20/2013   EGD confirmed.  [facility].   History of Barrett's esophagus 2014   pt told by dr Ewing Schlein no barrett's esophagus 03-03-2021   History of hypertension    no meds since 2019   History of kidney stones    History of prediabetes    non  pre dm since june 2021 weight loss   Hyperlipemia    Neuromuscular disorder (HCC)    numbness rt hand   Seasonal allergies    Sleep apnea    uses CPAP nightly   Past Surgical History:  Procedure Laterality Date   BLADDER STONE REMOVAL  2011, 2018   CARPAL TUNNEL RELEASE  01/17/2012   Procedure: CARPAL TUNNEL RELEASE;  Surgeon: [name]., MD;  Location: [facility];  Service: Orthopedics;  Laterality: Right;   CERVICAL FUSION  1976   COLONOSCOPY  10/20/2013   [facility].  Polyps.    CYSTOSCOPY WITH LITHOLAPAXY N/A 01/09/2017   Procedure: CYSTOSCOPY WITH LITHOLAPAXY;  Surgeon: Marcine Matar, MD;  Location: [facility];  Service: Urology;  Laterality: N/A;   CYSTOSCOPY WITH  LITHOLAPAXY N/A 07/05/2023   Procedure: CYSTOSCOPY WITH LITHOLAPAXY, LASER OF BLADDER STONE;  Surgeon: Loletta Parish., MD;  Location: [facility];  Service: Urology;  Laterality: N/A;   CYSTOSCOPY WITH RETROGRADE PYELOGRAM, URETEROSCOPY AND STENT PLACEMENT Right 07/05/2023   Procedure: CYSTOSCOPY WITH RETROGRADE PYELOGRAM, URETEROSCOPY AND STENT PLACEMENT, LASER INCISION OF URETEROCELE;  Surgeon: Loletta Parish., MD;  Location: [facility];  Service: Urology;  Laterality: Right;   ESOPHAGOGASTRODUODENOSCOPY  10/20/2013   [facility]; biopsy of area concernig for Barrett's; gastritis.   EYE SURGERY  2012   both cataracts   FRACTURE SURGERY Right 1960's   3 fingers   HOLMIUM LASER APPLICATION N/A 01/09/2017   Procedure: HOLMIUM LASER APPLICATION;  Surgeon: Marcine Matar, MD;  Location[facility];  Service: Urology;  Laterality: N/A;   HOLMIUM LASER APPLICATION Right 07/05/2023   Procedure: HOLMIUM LASER APPLICATION;  Surgeon: Loletta Parish., MD;  Location: [facility];  Service: Urology;  Laterality: Right;   QUADRICEPS TENDON REPAIR  2003   bilateral   TRANSURETHRAL RESECTION OF PROSTATE N/A 03/08/2021   Procedure: TRANSURETHRAL RESECTION OF THE PROSTATE (TURP);  Surgeon: Marcine Matar, MD;  Location: [facility];  Service: Urology;  Laterality: N/A;   ULNAR NERVE TRANSPOSITION  01/17/2012   Procedure: ULNAR NERVE DECOMPRESSION/TRANSPOSITION;  Surgeon: [name]., MD;  Location: [facility];  Service: Orthopedics;  Laterality: Right;  decompression of ulnar nerve only  at right cubital tunnel   ULNAR NERVE TRANSPOSITION Left 06/01/2017   Procedure: DECOMPRESSION ULNAR NERVE LEFT CUBITAL TUNNEL;  Surgeon: Betha Loa, MD;  Location: [facility];  Service: Orthopedics;  Laterality: Left;   UPPER GASTROINTESTINAL ENDOSCOPY     VASECTOMY  yrs ago   Allergies  Allergen Reactions   Shellfish Allergy Other (See Comments)     Headaches, high blood pressure   Ace Inhibitors Cough   Amoxicillin Nausea And Vomiting   Darvon     Chest pain   Toprol Xl [Metoprolol Tartrate] Other (See Comments)    Not sure reaction   Prior to Admission medications   Medication Sig Start Date End Date Taking? Authorizing Provider  Digestive Enzymes (PAPAYA AND ENZYMES PO) Take 7 tablets by mouth in the morning and at bedtime. With breakfast and Supper   Yes [provider]  GARLIC PO Take 9,562 mg by mouth in the morning.   Yes [provider]  Multiple Vitamin (MULTIVITAMIN) tablet Take 1 tablet by mouth in the morning.   Yes [provider]  NON FORMULARY Take 2 tablets by mouth in the morning. Super Beets Chewables   Yes [provider]  OMEGA-3 FATTY  ACIDS PO Take 2,000 mg by mouth in the morning.   Yes [provider]  pravastatin (PRAVACHOL) 20 MG tablet Take 20 mg by mouth at bedtime.   Yes [provider]  psyllium (METAMUCIL) 58.6 % powder Take 1 packet by mouth in the morning.   Yes [provider]  Specialty Vitamins Products (BRAIN PO) Take 1 capsule by mouth in the morning.   Yes [provider]  TURMERIC CURCUMIN PO Take 3 capsules by mouth in the morning.   Yes [provider]   Social History   Socioeconomic History   Marital status: Married    Spouse name: Keena Corris   Number of children: 2   Years of education: 12   Highest education level: Not on file  Occupational History   Occupation: Retired  Tobacco Use   Smoking status: Former    Current packs/day: 0.00    Average packs/day: 2.0 packs/day for 15.0 years (30.0 ttl pk-yrs)    Types: Cigarettes    Start date: 01/12/1959    Quit date: 01/11/1974    Years since quitting: 49.7   Smokeless tobacco: Never  Vaping Use   Vaping status: Never Used  Substance and Sexual Activity   Alcohol use: Yes    Comment: occ wine   Drug use: No   Sexual activity: Not Currently  Other Topics  Concern   Not on file  Social History Narrative   Marital status:  Married x 42 years      Children: 2 daughters; 3 grandchildren.      Employment; retired in 1998 from [employer]; retired age 7 from [employer] after 14 years.      Lives: with wife.      Tobacco: former smoker; quit at age 69.  Smoked 15 years.      Alcohol:  On vacation or special occasions.        Education: [employer].       Exercise: Gym 3-4 times a week for 2 hours.; exercises 7 days per week.   Elliptical for one hour.      Advanced Directives: none; +desires FULL CODE.        ADLs: independent with ADLs; drives.  Does not walk with assistant devices.     Drinks 3-4 caffeine drinks a day    Social Determinants of Health   Financial Resource Strain: Low Risk  (12/21/2022)   Overall Financial Resource Strain (CARDIA)    Difficulty of Paying Living Expenses: Not hard at all  Food Insecurity: No Food Insecurity (12/21/2022)   Hunger Vital Sign    Worried About Running Out of Food in the Last Year: Never true    Ran Out of Food in the Last Year: Never true  Transportation Needs: No Transportation Needs (12/21/2022)   PRAPARE - Administrator, Civil Service (Medical): No    Lack of Transportation (Non-Medical): No  Physical Activity: Sufficiently Active (12/21/2022)   Exercise Vital Sign    Days of Exercise per Week: 7 days    Minutes of Exercise per Session: 30 min  Stress: No Stress Concern Present (12/21/2022)   [employer] of Occupational Health - Occupational Stress Questionnaire    Feeling of Stress : Not at all  Social Connections: Socially Integrated (12/21/2022)   Social Connection and Isolation Panel [NHANES]    Frequency of Communication with Friends and Family: More than three times a week    Frequency of Social Gatherings with Friends  and Family: Three times a week    Attends Religious Services: More than 4 times per year    Active Member of Clubs or Organizations:  Yes    Attends Banker Meetings: More than 4 times per year    Marital Status: Married  Catering manager Violence: Not At Risk (12/21/2022)   Humiliation, Afraid, Rape, and Kick questionnaire    Fear of Current or Ex-Partner: No    Emotionally Abused: No    Physically Abused: No    Sexually Abused: No    Review of Systems   Objective:   Vitals:   09/21/23 0832  BP: 116/80  Pulse: 76  Temp: 98.3 F (36.8 C)  TempSrc: Temporal  SpO2: 97%  Weight: 213 lb (96.6 kg)  Height: 5' 9.5" (1.765 m)     Physical Exam Vitals reviewed.  Constitutional:      Appearance: He is well-developed.  HENT:     Head: Normocephalic and atraumatic.     Mouth/Throat:     Comments: Small 1 to 2 mm flesh-colored papule inside left upper lip in mucosa. See photo.  Nontender.  No discharge. Neck:     Vascular: No carotid bruit or JVD.  Cardiovascular:     Rate and Rhythm: Normal rate and regular rhythm.     Heart sounds: Normal heart sounds. No murmur heard. Pulmonary:     Effort: Pulmonary effort is normal.     Breath sounds: Normal breath sounds. No rales.     Comments: Mild discomfort over the right chest wall without 1 focal area of bony tenderness.  Primarily lower right chest wall, anterior axillary line.  No crepitus.  Skin intact without ecchymosis. Musculoskeletal:     Right lower leg: No edema.     Left lower leg: No edema.     Comments: Multiple prominent DIP joints of hands.  Motion intact.  Motion intact of bilateral knees.  Skin:    General: Skin is warm and dry.  Neurological:     Mental Status: He is alert and oriented to person, place, and time.  Psychiatric:        Mood and Affect: Mood normal.        Assessment & Plan:  Todd [name] is a 82 y.o. male . Prediabetes - Plan: Hemoglobin A1c  -Diet/exercise approach, check A1c and adjust plan accordingly.  Mixed hyperlipidemia - Plan: Comprehensive metabolic panel, Lipid panel  -Tolerating pravastatin,  continue same.  Check labs and adjust regimen accordingly.  Right-sided chest wall pain - Plan: DG Ribs Unilateral W/Chest Right  -Suspected rib fracture, x-ray does confirm minimally displaced rib fracture but no sign of pneumothorax or fluid within lungs.  Based on timing of symptoms and minimal discomfort managed with over-the-counter notes, no new meds at this time.  Option of gabapentin if worsening or persistent pain with RTC precautions given after phone call with rib x-ray results.  Lesion of mouth  -Possible small cyst within mucosa but recommended he discuss initially with his dentist and option of maxillofacial/oral surgeon evaluation.  Osteoarthritis of multiple joints, unspecified osteoarthritis type  -Over-the-counter treatments initially discussed with Tylenol, glucosamine/chondroitin, and follow-up if 1 specific area is bothering him more, option of orthopedic evaluation.  No orders of the defined types were placed in this encounter.  Patient Instructions  Try over-the-counter glucosamine chondroitin to see if that helps with the various areas of arthritis.  Tylenol is fine to take for arthritis if you would like.  We  can recheck this in the next few months but if you have one area that is hurting more than usual or any limitations in activity please schedule a separate visit we can look at that further including options or possible orthopedic evaluation.  I do recommend xray of right ribs to rule out fracture. Tylenol is fine if needed. Short term ibuprofen if needed, but I prefer tylenol.    [facility] or xray: Walk in 8:30-4:30 during weekdays, no appointment needed [address]  [place], [place] [address]  Area mouth could be a cyst or benign lesion but I would like to discuss that with your dentist next week.  I am happy to refer you to an oral surgeon as well if needed.  Let me know.  I will check labs for cholesterol and prediabetes.   Recheck in 3 months.  Return  to the clinic or go to the nearest emergency room if any of your symptoms worsen or new symptoms occur.   Osteoarthritis  Osteoarthritis is a type of arthritis. It refers to joint pain or joint disease. Osteoarthritis affects tissue that covers the ends of bones in joints (cartilage). Cartilage acts as a cushion between the bones and helps them move smoothly. Osteoarthritis occurs when cartilage in the joints gets worn down. Osteoarthritis is sometimes called "wear and tear" arthritis. Osteoarthritis is the most common form of arthritis. It often occurs in older people. It is a condition that gets worse over time. The joints most often affected by this condition are in the fingers, toes, hips, knees, and spine, including the neck and lower back. What are the causes? This condition is caused by the wearing down of cartilage that covers the ends of bones. What increases the risk? The following factors may make you more likely to develop this condition: Being age 56 or older. Obesity. Overuse of joints. Past injury of a joint. Past surgery on a joint. Family history of osteoarthritis. What are the signs or symptoms? The main symptoms of this condition are pain, swelling, and stiffness in the joint. Other symptoms may include: An enlarged joint. More pain and further damage caused by small pieces of bone or cartilage that break off and float inside of the joint. Small deposits of bone (osteophytes) that grow on the edges of the joint. A grating or scraping feeling inside the joint when you move it. Popping or creaking sounds when you move. Difficulty walking or exercising. An inability to grip items, twist your hand, or control the movements of your hands and fingers. How is this diagnosed? This condition may be diagnosed based on: Your medical history. A physical exam. Your symptoms. X-rays of the affected joints. Blood tests to rule out other types of arthritis. How is this  treated? There is no cure for this condition, but treatment can help control pain and improve joint function. Treatment may include a combination of therapies, such as: Pain relief techniques, such as: Applying heat and cold to the joint. Massage. A form of talk therapy called cognitive behavioral therapy (CBT). This therapy helps you set goals and follow up on the changes that you make. Medicines for pain and inflammation. The medicines can be taken by mouth or applied to the skin. They include: NSAIDs, such as ibuprofen. Prescription medicines. Strong anti-inflammatory medicines (corticosteroids). Certain nutritional supplements. A prescribed exercise program. You may work with a physical therapist. Assistive devices, such as a brace, wrap, splint, specialized glove, or cane. A weight control plan. Surgery,  such as: An osteotomy. This is done to reposition the bones and relieve pain or to remove loose pieces of bone and cartilage. Joint replacement surgery. You may need this surgery if you have advanced osteoarthritis. Follow these instructions at home: Activity Rest your affected joints as told by your health care provider. Exercise as told by your provider. The provider may recommend specific types of exercise, such as: Strengthening exercises. These are done to strengthen the muscles that support joints affected by arthritis. Aerobic activities. These are exercises, such as brisk walking or water aerobics, that increase your heart rate. Range-of-motion activities. These help your joints move more easily. Balance and agility exercises. Managing pain, stiffness, and swelling     If told, apply heat to the affected area as often as told by your provider. Use the heat source that your provider recommends, such as a moist heat pack or a heating pad. If you have a removable assistive device, remove it as told by your provider. Place a towel between your skin and the heat source. If your  provider tells you to keep the assistive device on while you apply heat, place a towel between the assistive device and the heat source. Leave the heat on for 20-30 minutes. If told, put ice on the affected area. If you have a removable assistive device, remove it as told by your provider. Put ice in a plastic bag. Place a towel between your skin and the bag. If your provider tells you to keep the assistive device on during icing, place a towel between the assistive device and the bag. Leave the ice on for 20 minutes, 2-3 times a day. If your skin turns bright red, remove the ice or heat right away to prevent skin damage. The risk of damage is higher if you cannot feel pain, heat, or cold. Move your fingers or toes often to reduce stiffness and swelling. Raise (elevate) the affected area above the level of your heart while you are sitting or lying down. General instructions Take over-the-counter and prescription medicines only as told by your provider. Maintain a healthy weight. Follow instructions from your provider for weight control. Do not use any products that contain nicotine or tobacco. These products include cigarettes, chewing tobacco, and vaping devices, such as e-cigarettes. If you need help quitting, ask your provider. Use assistive devices as told by your provider. Where to find more information [employer] of Arthritis and Musculoskeletal and Skin Diseases: niams.http://www.myers.net/ [employer] on Aging: BaseRingTones.pl American College of Rheumatology: rheumatology.org Contact a health care provider if: You have redness, swelling, or a feeling of warmth in a joint that gets worse. You have a fever along with joint or muscle aches. You develop a rash. You have trouble doing your normal activities. You have pain that gets worse and is not relieved by pain medicine. This information is not intended to replace advice given to you by your health care provider. Make sure you discuss  any questions you have with your health care provider. Document Revised: 06/23/2022 Document Reviewed: 06/23/2022 Elsevier Patient Education  2024 Elsevier Inc.  Chest Wall Pain Chest wall pain is pain in or around the bones and muscles of your chest. Sometimes, an injury causes this pain. Excessive coughing or overuse of arm and chest muscles may also cause chest wall pain. Sometimes, the cause may not be known. This pain may take several weeks or longer to get better. Follow these instructions at home: Managing pain, stiffness, and swelling  If directed, put ice on the painful area: Put ice in a plastic bag. Place a towel between your skin and the bag. Leave the ice on for 20 minutes, 2-3 times per day. Activity Rest as told by your health care provider. Avoid activities that cause pain. These include any activities that use your chest muscles or your abdominal and side muscles to lift heavy items. Ask your health care provider what activities are safe for you. General instructions  Take over-the-counter and prescription medicines only as told by your health care provider. Do not use any products that contain nicotine or tobacco, such as cigarettes, e-cigarettes, and chewing tobacco. These can delay healing after injury. If you need help quitting, ask your health care provider. Keep all follow-up visits as told by your health care provider. This is important. Contact a health care provider if: You have a fever. Your chest pain becomes worse. You have new symptoms. Get help right away if: You have nausea or vomiting. You feel sweaty or light-headed. You have a cough with mucus from your lungs (sputum) or you cough up blood. You develop shortness of breath. These symptoms may represent a serious problem that is an emergency. Do not wait to see if the symptoms will go away. Get medical help right away. Call your local emergency services (911 in the [place] Do not drive yourself to the  hospital. Summary Chest wall pain is pain in or around the bones and muscles of your chest. Depending on the cause, it may be treated with ice, rest, medicines, and avoiding activities that cause pain. Contact a health care provider if you have a fever, worsening chest pain, or new symptoms. Get help right away if you feel light-headed or you develop shortness of breath. These symptoms may be an emergency. This information is not intended to replace advice given to you by your health care provider. Make sure you discuss any questions you have with your health care provider. Document Revised: 10/17/2022 Document Reviewed: 10/17/2022 Elsevier Patient Education  2024 Elsevier Inc.     Signed,   Meredith Staggers, MD [facility], [facility] 09/21/23 9:37 AM

## 2023-09-21 NOTE — Patient Instructions (Addendum)
Try over-the-counter glucosamine chondroitin to see if that helps with the various areas of arthritis.  Tylenol is fine to take for arthritis if you would like.  We can recheck this in the next few months but if you have one area that is hurting more than usual or any limitations in activity please schedule a separate visit we can look at that further including options or possible orthopedic evaluation.  I do recommend xray of right ribs to rule out fracture. Tylenol is fine if needed. Short term ibuprofen if needed, but I prefer tylenol.   [facility] or xray: Walk in 8:30-4:30 during weekdays, no appointment needed [address]  [place], [place] [address]  Area mouth could be a cyst or benign lesion but I would like to discuss that with your dentist next week.  I am happy to refer you to an oral surgeon as well if needed.  Let me know.  I will check labs for cholesterol and prediabetes.   Recheck in 3 months.  Return to the clinic or go to the nearest emergency room if any of your symptoms worsen or new symptoms occur.   Osteoarthritis  Osteoarthritis is a type of arthritis. It refers to joint pain or joint disease. Osteoarthritis affects tissue that covers the ends of bones in joints (cartilage). Cartilage acts as a cushion between the bones and helps them move smoothly. Osteoarthritis occurs when cartilage in the joints gets worn down. Osteoarthritis is sometimes called "wear and tear" arthritis. Osteoarthritis is the most common form of arthritis. It often occurs in older people. It is a condition that gets worse over time. The joints most often affected by this condition are in the fingers, toes, hips, knees, and spine, including the neck and lower back. What are the causes? This condition is caused by the wearing down of cartilage that covers the ends of bones. What increases the risk? The following factors may make you more likely to develop this condition: Being age 38 or  older. Obesity. Overuse of joints. Past injury of a joint. Past surgery on a joint. Family history of osteoarthritis. What are the signs or symptoms? The main symptoms of this condition are pain, swelling, and stiffness in the joint. Other symptoms may include: An enlarged joint. More pain and further damage caused by small pieces of bone or cartilage that break off and float inside of the joint. Small deposits of bone (osteophytes) that grow on the edges of the joint. A grating or scraping feeling inside the joint when you move it. Popping or creaking sounds when you move. Difficulty walking or exercising. An inability to grip items, twist your hand, or control the movements of your hands and fingers. How is this diagnosed? This condition may be diagnosed based on: Your medical history. A physical exam. Your symptoms. X-rays of the affected joints. Blood tests to rule out other types of arthritis. How is this treated? There is no cure for this condition, but treatment can help control pain and improve joint function. Treatment may include a combination of therapies, such as: Pain relief techniques, such as: Applying heat and cold to the joint. Massage. A form of talk therapy called cognitive behavioral therapy (CBT). This therapy helps you set goals and follow up on the changes that you make. Medicines for pain and inflammation. The medicines can be taken by mouth or applied to the skin. They include: NSAIDs, such as ibuprofen. Prescription medicines. Strong anti-inflammatory medicines (corticosteroids). Certain nutritional supplements.  A prescribed exercise program. You may work with a physical therapist. Assistive devices, such as a brace, wrap, splint, specialized glove, or cane. A weight control plan. Surgery, such as: An osteotomy. This is done to reposition the bones and relieve pain or to remove loose pieces of bone and cartilage. Joint replacement surgery. You may need  this surgery if you have advanced osteoarthritis. Follow these instructions at home: Activity Rest your affected joints as told by your health care provider. Exercise as told by your provider. The provider may recommend specific types of exercise, such as: Strengthening exercises. These are done to strengthen the muscles that support joints affected by arthritis. Aerobic activities. These are exercises, such as brisk walking or water aerobics, that increase your heart rate. Range-of-motion activities. These help your joints move more easily. Balance and agility exercises. Managing pain, stiffness, and swelling     If told, apply heat to the affected area as often as told by your provider. Use the heat source that your provider recommends, such as a moist heat pack or a heating pad. If you have a removable assistive device, remove it as told by your provider. Place a towel between your skin and the heat source. If your provider tells you to keep the assistive device on while you apply heat, place a towel between the assistive device and the heat source. Leave the heat on for 20-30 minutes. If told, put ice on the affected area. If you have a removable assistive device, remove it as told by your provider. Put ice in a plastic bag. Place a towel between your skin and the bag. If your provider tells you to keep the assistive device on during icing, place a towel between the assistive device and the bag. Leave the ice on for 20 minutes, 2-3 times a day. If your skin turns bright red, remove the ice or heat right away to prevent skin damage. The risk of damage is higher if you cannot feel pain, heat, or cold. Move your fingers or toes often to reduce stiffness and swelling. Raise (elevate) the affected area above the level of your heart while you are sitting or lying down. General instructions Take over-the-counter and prescription medicines only as told by your provider. Maintain a healthy  weight. Follow instructions from your provider for weight control. Do not use any products that contain nicotine or tobacco. These products include cigarettes, chewing tobacco, and vaping devices, such as e-cigarettes. If you need help quitting, ask your provider. Use assistive devices as told by your provider. Where to find more information [employer] of Arthritis and Musculoskeletal and Skin Diseases: niams.http://www.myers.net/ [employer] on Aging: BaseRingTones.pl American College of Rheumatology: rheumatology.org Contact a health care provider if: You have redness, swelling, or a feeling of warmth in a joint that gets worse. You have a fever along with joint or muscle aches. You develop a rash. You have trouble doing your normal activities. You have pain that gets worse and is not relieved by pain medicine. This information is not intended to replace advice given to you by your health care provider. Make sure you discuss any questions you have with your health care provider. Document Revised: 06/23/2022 Document Reviewed: 06/23/2022 Elsevier Patient Education  2024 [employer]  Chest Wall Pain Chest wall pain is pain in or around the bones and muscles of your chest. Sometimes, an injury causes this pain. Excessive coughing or overuse of arm and chest muscles may also cause chest wall pain. Sometimes,  the cause may not be known. This pain may take several weeks or longer to get better. Follow these instructions at home: Managing pain, stiffness, and swelling  If directed, put ice on the painful area: Put ice in a plastic bag. Place a towel between your skin and the bag. Leave the ice on for 20 minutes, 2-3 times per day. Activity Rest as told by your health care provider. Avoid activities that cause pain. These include any activities that use your chest muscles or your abdominal and side muscles to lift heavy items. Ask your health care provider what activities are safe for  you. General instructions  Take over-the-counter and prescription medicines only as told by your health care provider. Do not use any products that contain nicotine or tobacco, such as cigarettes, e-cigarettes, and chewing tobacco. These can delay healing after injury. If you need help quitting, ask your health care provider. Keep all follow-up visits as told by your health care provider. This is important. Contact a health care provider if: You have a fever. Your chest pain becomes worse. You have new symptoms. Get help right away if: You have nausea or vomiting. You feel sweaty or light-headed. You have a cough with mucus from your lungs (sputum) or you cough up blood. You develop shortness of breath. These symptoms may represent a serious problem that is an emergency. Do not wait to see if the symptoms will go away. Get medical help right away. Call your local emergency services (911 in the [place] Do not drive yourself to the hospital. Summary Chest wall pain is pain in or around the bones and muscles of your chest. Depending on the cause, it may be treated with ice, rest, medicines, and avoiding activities that cause pain. Contact a health care provider if you have a fever, worsening chest pain, or new symptoms. Get help right away if you feel light-headed or you develop shortness of breath. These symptoms may be an emergency. This information is not intended to replace advice given to you by your health care provider. Make sure you discuss any questions you have with your health care provider. Document Revised: 10/17/2022 Document Reviewed: 10/17/2022 Elsevier Patient Education  2024 [employer].

## 2023-09-23 ENCOUNTER — Encounter: Payer: Self-pay | Admitting: Family Medicine

## 2023-10-21 ENCOUNTER — Other Ambulatory Visit: Payer: Self-pay | Admitting: Family Medicine

## 2023-10-21 DIAGNOSIS — E785 Hyperlipidemia, unspecified: Secondary | ICD-10-CM

## 2023-10-23 ENCOUNTER — Ambulatory Visit: Payer: Medicare Other | Admitting: Adult Health

## 2023-11-30 ENCOUNTER — Telehealth: Payer: Self-pay | Admitting: Neurology

## 2023-11-30 NOTE — Telephone Encounter (Signed)
Pt called to verify appointment

## 2023-12-22 ENCOUNTER — Encounter: Payer: Medicare Other | Admitting: Family Medicine

## 2023-12-25 ENCOUNTER — Encounter: Payer: Self-pay | Admitting: Neurology

## 2023-12-25 ENCOUNTER — Ambulatory Visit (INDEPENDENT_AMBULATORY_CARE_PROVIDER_SITE_OTHER): Payer: Medicare Other | Admitting: Neurology

## 2023-12-25 VITALS — BP 136/75 | HR 83 | Ht 71.0 in | Wt 205.0 lb

## 2023-12-25 DIAGNOSIS — G4733 Obstructive sleep apnea (adult) (pediatric): Secondary | ICD-10-CM

## 2023-12-25 DIAGNOSIS — Z9181 History of falling: Secondary | ICD-10-CM

## 2023-12-25 DIAGNOSIS — Z9889 Other specified postprocedural states: Secondary | ICD-10-CM | POA: Diagnosis not present

## 2023-12-25 DIAGNOSIS — Z872 Personal history of diseases of the skin and subcutaneous tissue: Secondary | ICD-10-CM | POA: Diagnosis not present
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
## 2023-12-25 NOTE — Patient Instructions (Addendum)
 It was nice to see you again. Your CPAP settings look good, your apnea is under good control on the current pressure.  Please continue using your CPAP regularly. While your insurance requires that you use CPAP at least 4 hours each night on 70% of the nights, I recommend, that you not skip any nights and use it throughout the night if you can. Getting used to CPAP and staying with the treatment long term does take time and patience and discipline. Untreated obstructive sleep apnea when it is moderate to severe can have an adverse impact on cardiovascular health and raise her risk for heart disease, arrhythmias, hypertension, congestive heart failure, stroke and diabetes. Untreated obstructive sleep apnea causes sleep disruption, nonrestorative sleep, and sleep deprivation. This can have an impact on your day to day functioning and cause daytime sleepiness and impairment of cognitive function, memory loss, mood disturbance, and problems focussing. Using CPAP regularly can improve these symptoms. We can see you in 1 year, you can see Butch Penny, NP again.

## 2023-12-27 ENCOUNTER — Ambulatory Visit (INDEPENDENT_AMBULATORY_CARE_PROVIDER_SITE_OTHER): Payer: Medicare Other | Admitting: *Deleted

## 2023-12-27 DIAGNOSIS — Z0000 Encounter for general adult medical examination without abnormal findings: Secondary | ICD-10-CM

## 2023-12-27 NOTE — Patient Instructions (Signed)
 [name] , Thank you for taking time to come for your Medicare Wellness Visit. I appreciate your ongoing commitment to your health goals. Please review the following plan we discussed and let me know if I can assist you in the future.   Screening recommendations/referrals: Colonoscopy: no longer Recommended yearly ophthalmology/optometry visit for glaucoma screening and checkup Recommended yearly dental visit for hygiene and checkup  Vaccinations: Influenza vaccine: up to date Pneumococcal vaccine: up to date Tdap vaccine: up to date Shingles vaccine: up to date    Advanced directives: up to date    Preventive Care 65 Years and Older, Male Preventive care refers to lifestyle choices and visits with your health care provider that can promote health and wellness. What does preventive care include? A yearly physical exam. This is also called an annual well check. Dental exams once or twice a year. Routine eye exams. Ask your health care provider how often you should have your eyes checked. Personal lifestyle choices, including: Daily care of your teeth and gums. Regular physical activity. Eating a healthy diet. Avoiding tobacco and drug use. Limiting alcohol use. Practicing safe sex. Taking low doses of aspirin every day. Taking vitamin and mineral supplements as recommended by your health care provider. What happens during an annual well check? The services and screenings done by your health care provider during your annual well check will depend on your age, overall health, lifestyle risk factors, and family history of disease. Counseling  Your health care provider may ask you questions about your: Alcohol use. Tobacco use. Drug use. Emotional well-being. Home and relationship well-being. Sexual activity. Eating habits. History of falls. Memory and ability to understand (cognition). Work and work Astronomer. Screening  You may have the following tests or  measurements: Height, weight, and BMI. Blood pressure. Lipid and cholesterol levels. These may be checked every 5 years, or more frequently if you are over 80 years old. Skin check. Lung cancer screening. You may have this screening every year starting at age 75 if you have a 30-pack-year history of smoking and currently smoke or have quit within the past 15 years. Fecal occult blood test (FOBT) of the stool. You may have this test every year starting at age 43. Flexible sigmoidoscopy or colonoscopy. You may have a sigmoidoscopy every 5 years or a colonoscopy every 10 years starting at age 109. Prostate cancer screening. Recommendations will vary depending on your family history and other risks. Hepatitis C blood test. Hepatitis B blood test. Sexually transmitted disease (STD) testing. Diabetes screening. This is done by checking your blood sugar (glucose) after you have not eaten for a while (fasting). You may have this done every 1-3 years. Abdominal aortic aneurysm (AAA) screening. You may need this if you are a current or former smoker. Osteoporosis. You may be screened starting at age 49 if you are at high risk. Talk with your health care provider about your test results, treatment options, and if necessary, the need for more tests. Vaccines  Your health care provider may recommend certain vaccines, such as: Influenza vaccine. This is recommended every year. Tetanus, diphtheria, and acellular pertussis (Tdap, Td) vaccine. You may need a Td booster every 10 years. Zoster vaccine. You may need this after age 62. Pneumococcal 13-valent conjugate (PCV13) vaccine. One dose is recommended after age 40. Pneumococcal polysaccharide (PPSV23) vaccine. One dose is recommended after age 59. Talk to your health care provider about which screenings and vaccines you need and how often you need them.  This information is not intended to replace advice given to you by your health care provider. Make sure  you discuss any questions you have with your health care provider. Document Released: 11/20/2015 Document Revised: 07/13/2016 Document Reviewed: 08/25/2015 Elsevier Interactive Patient Education  2017 [employer].  Fall Prevention in the Home Falls can cause injuries. They can happen to people of all ages. There are many things you can do to make your home safe and to help prevent falls. What can I do on the outside of my home? Regularly fix the edges of walkways and driveways and fix any cracks. Remove anything that might make you trip as you walk through a door, such as a raised step or threshold. Trim any bushes or trees on the path to your home. Use bright outdoor lighting. Clear any walking paths of anything that might make someone trip, such as rocks or tools. Regularly check to see if handrails are loose or broken. Make sure that both sides of any steps have handrails. Any raised decks and porches should have guardrails on the edges. Have any leaves, snow, or ice cleared regularly. Use sand or salt on walking paths during winter. Clean up any spills in your garage right away. This includes oil or grease spills. What can I do in the bathroom? Use night lights. Install grab bars by the toilet and in the tub and shower. Do not use towel bars as grab bars. Use non-skid mats or decals in the tub or shower. If you need to sit down in the shower, use a plastic, non-slip stool. Keep the floor dry. Clean up any water that spills on the floor as soon as it happens. Remove soap buildup in the tub or shower regularly. Attach bath mats securely with double-sided non-slip rug tape. Do not have throw rugs and other things on the floor that can make you trip. What can I do in the bedroom? Use night lights. Make sure that you have a light by your bed that is easy to reach. Do not use any sheets or blankets that are too big for your bed. They should not hang down onto the floor. Have a firm  chair that has side arms. You can use this for support while you get dressed. Do not have throw rugs and other things on the floor that can make you trip. What can I do in the kitchen? Clean up any spills right away. Avoid walking on wet floors. Keep items that you use a lot in easy-to-reach places. If you need to reach something above you, use a strong step stool that has a grab bar. Keep electrical cords out of the way. Do not use floor polish or wax that makes floors slippery. If you must use wax, use non-skid floor wax. Do not have throw rugs and other things on the floor that can make you trip. What can I do with my stairs? Do not leave any items on the stairs. Make sure that there are handrails on both sides of the stairs and use them. Fix handrails that are broken or loose. Make sure that handrails are as long as the stairways. Check any carpeting to make sure that it is firmly attached to the stairs. Fix any carpet that is loose or worn. Avoid having throw rugs at the top or bottom of the stairs. If you do have throw rugs, attach them to the floor with carpet tape. Make sure that you have a light switch at the  top of the stairs and the bottom of the stairs. If you do not have them, ask someone to add them for you. What else can I do to help prevent falls? Wear shoes that: Do not have high heels. Have rubber bottoms. Are comfortable and fit you well. Are closed at the toe. Do not wear sandals. If you use a stepladder: Make sure that it is fully opened. Do not climb a closed stepladder. Make sure that both sides of the stepladder are locked into place. Ask someone to hold it for you, if possible. Clearly mark and make sure that you can see: Any grab bars or handrails. First and last steps. Where the edge of each step is. Use tools that help you move around (mobility aids) if they are needed. These include: Canes. Walkers. Scooters. Crutches. Turn on the lights when you go  into a dark area. Replace any light bulbs as soon as they burn out. Set up your furniture so you have a clear path. Avoid moving your furniture around. If any of your floors are uneven, fix them. If there are any pets around you, be aware of where they are. Review your medicines with your doctor. Some medicines can make you feel dizzy. This can increase your chance of falling. Ask your doctor what other things that you can do to help prevent falls. This information is not intended to replace advice given to you by your health care provider. Make sure you discuss any questions you have with your health care provider. Document Released: 08/20/2009 Document Revised: 03/31/2016 Document Reviewed: 11/28/2014 Elsevier Interactive Patient Education  2017 [employer].

## 2023-12-27 NOTE — Progress Notes (Signed)
 Subjective:   Todd [name] is a 83 y.o. male who presents for Medicare Annual/Subsequent preventive examination.  Visit Complete: Virtual I connected with  Todd [name] on 12/27/23 by a audio enabled telemedicine application and verified that I am speaking with the correct person using two identifiers.  Patient Location: Home  Provider Location: Home Office  I discussed the limitations of evaluation and management by telemedicine. The patient expressed understanding and agreed to proceed.  Vital Signs: Because this visit was a virtual/telehealth visit, some criteria may be missing or patient reported. Any vitals not documented were not able to be obtained and vitals that have been documented are patient reported.  Patient Medicare AWV questionnaire was completed by the patient on 12-24-2023; I have confirmed that all information answered by patient is correct and no changes since this date.  Cardiac Risk Factors include: advanced age (>21men, >30 women);male gender;obesity (BMI >30kg/m2)     Objective:    There were no vitals filed for this visit. There is no height or weight on file to calculate BMI.     12/27/2023    8:55 AM 08/27/2023    9:17 AM 07/05/2023   11:39 AM 06/22/2023    8:40 AM 12/21/2022    8:41 AM 10/18/2021    7:52 AM 10/14/2021    8:27 AM  Advanced Directives  Does Patient Have a Medical Advance Directive? Yes No Yes Yes Yes Yes Yes  Type of Forensic scientist of [place];Living will Living will;Healthcare Power of [employer] Power of [place];Living will  Healthcare Power of [place];Living will  Does patient want to make changes to medical advance directive?   No - Patient declined      Copy of Healthcare Power of Attorney in Chart? Yes - validated most recent copy scanned in chart (See row information)  No - copy requested  No - copy requested  No - copy requested    Current Medications  (verified) Outpatient Encounter Medications as of 12/27/2023  Medication Sig   Digestive Enzymes (PAPAYA AND ENZYMES PO) Take 7 tablets by mouth in the morning and at bedtime. With breakfast and Supper   GARLIC PO Take 0,272 mg by mouth in the morning.   Multiple Vitamin (MULTIVITAMIN) tablet Take 1 tablet by mouth in the morning.   NON FORMULARY Take 2 tablets by mouth in the morning. Super Beets Chewables   OMEGA-3 FATTY ACIDS PO Take 2,000 mg by mouth in the morning.   pravastatin (PRAVACHOL) 20 MG tablet TAKE 1 TABLET BY MOUTH EVERY DAY CAN START FEW DAYS PER WEEK AND INCREASE IF TOLERATED   psyllium (METAMUCIL) 58.6 % powder Take 1 packet by mouth in the morning.   Specialty Vitamins Products (BRAIN PO) Take 1 capsule by mouth in the morning.   TURMERIC CURCUMIN PO Take 3 capsules by mouth in the morning.   No facility-administered encounter medications on file as of 12/27/2023.    Allergies (verified) Shellfish allergy, Ace inhibitors, Amoxicillin, Darvon, and Toprol xl [metoprolol tartrate]   History: Past Medical History:  Diagnosis Date   Arthritis    hands   Bladder stone    BPH (benign prostatic hyperplasia)    Cancer (HCC)    skin cancer multiple; followed by [facility]/dermatology yearly.   Colon polyps     Previous polyps on colonoscopy.  [facility].     Hearing aid worn    both ears   Hiatal hernia 10/20/2013   EGD  confirmed.  [facility].   History of Barrett's esophagus 2014   pt told by dr Ewing Schlein no barrett's esophagus 03-03-2021   History of hypertension    no meds since 2019   History of kidney stones    History of prediabetes    non pre dm since june 2021 weight loss   Hyperlipemia    Neuromuscular disorder (HCC)    numbness rt hand   Seasonal allergies    Sleep apnea    uses CPAP nightly   Past Surgical History:  Procedure Laterality Date   BLADDER STONE REMOVAL  2011, 2018   CARPAL TUNNEL RELEASE  01/17/2012   Procedure: CARPAL TUNNEL RELEASE;  Surgeon: [name]., MD;  Location: [facility];  Service: Orthopedics;  Laterality: Right;   CERVICAL FUSION  1976   COLONOSCOPY  10/20/2013   [facility].  Polyps.    CYSTOSCOPY WITH LITHOLAPAXY N/A 01/09/2017   Procedure: CYSTOSCOPY WITH LITHOLAPAXY;  Surgeon: Marcine Matar, MD;  Location: [facility];  Service: Urology;  Laterality: N/A;   CYSTOSCOPY WITH LITHOLAPAXY N/A 07/05/2023   Procedure: CYSTOSCOPY WITH LITHOLAPAXY, LASER OF BLADDER STONE;  Surgeon: Loletta Parish., MD;  Location: [facility];  Service: Urology;  Laterality: N/A;   CYSTOSCOPY WITH RETROGRADE PYELOGRAM, URETEROSCOPY AND STENT PLACEMENT Right 07/05/2023   Procedure: CYSTOSCOPY WITH RETROGRADE PYELOGRAM, URETEROSCOPY AND STENT PLACEMENT, LASER INCISION OF URETEROCELE;  Surgeon: Loletta Parish., MD;  Location: [facility];  Service: Urology;  Laterality: Right;   ESOPHAGOGASTRODUODENOSCOPY  10/20/2013   [facility]; biopsy of area concernig for Barrett's; gastritis.   EYE SURGERY  2012   both cataracts   FRACTURE SURGERY Right 1960's   3 fingers   HOLMIUM LASER APPLICATION N/A 01/09/2017   Procedure: HOLMIUM LASER APPLICATION;  Surgeon: Marcine Matar, MD;  Location: [facility];  Service: Urology;  Laterality: N/A;   HOLMIUM LASER APPLICATION Right 07/05/2023   Procedure: HOLMIUM LASER APPLICATION;  Surgeon: Loletta Parish., MD;  Location: [facility];  Service: Urology;  Laterality: Right;   QUADRICEPS TENDON REPAIR  2003   bilateral   TRANSURETHRAL RESECTION OF PROSTATE N/A 03/08/2021   Procedure: TRANSURETHRAL RESECTION OF THE PROSTATE (TURP);  Surgeon: Marcine Matar, MD;  Location: [facility];  Service: Urology;  Laterality: N/A;   ULNAR NERVE TRANSPOSITION  01/17/2012   Procedure: ULNAR NERVE DECOMPRESSION/TRANSPOSITION;  Surgeon: [name]., MD;  Location: [facility];  Service: Orthopedics;  Laterality: Right;  decompression of ulnar nerve only   at right cubital tunnel   ULNAR NERVE TRANSPOSITION Left 06/01/2017   Procedure: DECOMPRESSION ULNAR NERVE LEFT CUBITAL TUNNEL;  Surgeon: Betha Loa, MD;  Location: [facility];  Service: Orthopedics;  Laterality: Left;   UPPER GASTROINTESTINAL ENDOSCOPY     VASECTOMY  yrs ago   Family History  Problem Relation Age of Onset   Heart failure Mother        in 3s   Cirrhosis Father    Hypertension Father    Alcohol abuse Father    Sleep apnea Father    Arthritis Brother        Rheumatoid arthritis   Hypertension Brother    Neuropathy Neg Hx    Social History   Socioeconomic History   Marital status: Married    Spouse name: Machi Whittaker   Number of children: 2   Years of education: 12   Highest education level: 12th grade  Occupational History   Occupation: Retired  Tobacco Use   Smoking status: Former    Current packs/day: 0.00    Average packs/day: 2.0 packs/day for 15.0 years (30.0 ttl pk-yrs)    Types: Cigarettes    Start date: 01/12/1959    Quit date: 01/11/1974    Years since quitting: 49.9   Smokeless tobacco: Never  Vaping Use   Vaping status: Never Used  Substance and Sexual Activity   Alcohol use: Yes    Comment: occ wine   Drug use: No   Sexual activity: Not Currently  Other Topics Concern   Not on file  Social History Narrative   Marital status:  Married x 42 years      Children: 2 daughters; 3 grandchildren.      Employment; retired in 1998 from [employer]; retired age 89 from [employer] after 14 years.      Lives: with wife.      Tobacco: former smoker; quit at age 71.  Smoked 15 years.      Alcohol:  On vacation or special occasions.        Education: [employer].       Exercise: Gym 3-4 times a week for 2 hours.; exercises 7 days per week.   Elliptical for one hour.      Advanced Directives: none; +desires FULL CODE.        ADLs: independent with ADLs; drives.  Does not walk with assistant devices.     Drinks 3-4 caffeine  drinks a day    Social Drivers of Corporate investment banker Strain: Low Risk  (12/27/2023)   Overall Financial Resource Strain (CARDIA)    Difficulty of Paying Living Expenses: Not hard at all  Food Insecurity: No Food Insecurity (12/27/2023)   Hunger Vital Sign    Worried About Running Out of Food in the Last Year: Never true    Ran Out of Food in the Last Year: Never true  Transportation Needs: No Transportation Needs (12/27/2023)   PRAPARE - Administrator, Civil Service (Medical): No    Lack of Transportation (Non-Medical): No  Physical Activity: Sufficiently Active (12/27/2023)   Exercise Vital Sign    Days of Exercise per Week: 6 days    Minutes of Exercise per Session: 60 min  Stress: No Stress Concern Present (12/27/2023)   [employer] of Occupational Health - Occupational Stress Questionnaire    Feeling of Stress : Not at all  Social Connections: Moderately Integrated (12/27/2023)   Social Connection and Isolation Panel [NHANES]    Frequency of Communication with Friends and Family: Once a week    Frequency of Social Gatherings with Friends and Family: Once a week    Attends Religious Services: More than 4 times per year    Active Member of [employer] or Organizations: Yes    Attends Banker Meetings: Never    Marital Status: Married    Tobacco Counseling Counseling given: Not Answered   Clinical Intake:  Pre-visit preparation completed: Yes  Pain : No/denies pain     Diabetes: No  How often do you need to have someone help you when you read instructions, pamphlets, or other written materials from your doctor or pharmacy?: 1 - Never  Interpreter Needed?: No  Information entered by :: Remi Haggard LPN   Activities of Daily Living    12/27/2023    8:57 AM 12/24/2023    4:06 PM  In your present state  of health, do you have any difficulty performing the following activities:  Hearing? 0 0  Vision? 0 0  Difficulty concentrating or  making decisions? 0 0  Walking or climbing stairs? 0 0  Dressing or bathing? 0 0  Doing errands, shopping? 0 0  Preparing Food and eating ? N N  Using the Toilet? N N  In the past six months, have you accidently leaked urine? N   Do you have problems with loss of bowel control? N N  Managing your Medications? N N  Managing your Finances? N N  Housekeeping or managing your Housekeeping? N N    Patient Care Team: Shade Flood, MD as PCP - General (Family Medicine) Kathleene Hazel, MD as PCP - Cardiology (Cardiology) Janet Berlin, MD as Consulting Physician (Ophthalmology) Marcine Matar, MD as Consulting Physician (Urology) Benita Stabile, MD (Dermatology) Kathleene Hazel, MD as Consulting Physician (Cardiology) Huston Foley, MD as Attending Physician (Neurology)  Indicate any recent Medical Services you may have received from other than [facility] providers in the past year (date may be approximate).     Assessment:   This is a routine wellness examination for Todd [name].  Hearing/Vision screen Hearing Screening - Comments:: Bilateral hearing aids  Vision Screening - Comments:: [facility] Up to date   Goals Addressed             This Visit's Progress    Patient Stated   On track    Wants to stay active and healthy.  Get better at the piano     Patient Stated       Continue current lifestyle       Depression Screen    12/27/2023    8:59 AM 09/21/2023    8:35 AM 03/20/2023    9:50 AM 12/21/2022    8:39 AM 09/12/2022    9:03 AM 10/18/2021    7:51 AM 10/14/2021    8:28 AM  PHQ 2/9 Scores  PHQ - 2 Score 0 0 0 0 0 0 0  PHQ- 9 Score 0 0 0  0      Fall Risk    12/27/2023    8:56 AM 12/24/2023    4:06 PM 09/21/2023    8:35 AM 03/20/2023    9:51 AM 12/21/2022    8:32 AM  Fall Risk   Falls in the past year? 1 1 0 0 0  Number falls in past yr: 0 0 0 0 0  Injury with Fall? 1 1 0 0 0  Risk for fall due to :   No Fall Risks  Orthopedic patient  Follow  up Falls evaluation completed;Education provided;Falls prevention discussed  Falls evaluation completed Falls evaluation completed Falls prevention discussed;Education provided    MEDICARE RISK AT HOME: Medicare Risk at Home Any stairs in or around the home?: Yes If so, are there any without handrails?: No Home free of loose throw rugs in walkways, pet beds, electrical cords, etc?: Yes Adequate lighting in your home to reduce risk of falls?: Yes Life alert?: No Use of a cane, walker or w/c?: No Grab bars in the bathroom?: No Shower chair or bench in shower?: No Elevated toilet seat or a handicapped toilet?: No  TIMED UP AND GO:  Was the test performed?  No    Cognitive Function:        12/27/2023    8:56 AM 12/21/2022    8:43 AM 10/18/2021    7:51 AM 10/07/2020    1:08  PM 09/10/2019    9:07 AM  6CIT Screen  What Year? 0 points 0 points 0 points 0 points 0 points  What month? 0 points 0 points 0 points 0 points 0 points  What time? 0 points 0 points 0 points 0 points 0 points  Count back from 20 0 points 0 points 0 points 0 points 0 points  Months in reverse 0 points 0 points 0 points 0 points 0 points  Repeat phrase 0 points 2 points 0 points 0 points 0 points  Total Score 0 points 2 points 0 points 0 points 0 points    Immunizations Immunization History  Administered Date(s) Administered   Fluad Quad(high Dose 65+) 07/29/2022, 07/19/2023   Influenza Inj Mdck Quad Pf 08/13/2019   Influenza, High Dose Seasonal PF 07/24/2018   Influenza,inj,Quad PF,6+ Mos 07/15/2013, 07/21/2014, 07/07/2015, 07/06/2016, 08/04/2017   Influenza-Unspecified 08/12/2020   PFIZER(Purple Top)SARS-COV-2 Vaccination 12/15/2019, 01/09/2020, 09/10/2020   Pneumococcal Conjugate-13 08/11/2014   Pneumococcal Polysaccharide-23 03/10/2008, 08/23/2016   Tdap 06/14/2010, 05/23/2014   Zoster Recombinant(Shingrix) 09/11/2017, 02/02/2018   Zoster, Live 11/07/2010    TDAP status: Up to date  Flu Vaccine  status: Up to date  Pneumococcal vaccine status: Up to date  Covid-19 vaccine status: Declined, Education has been provided regarding the importance of this vaccine but patient still declined. Advised may receive this vaccine at local pharmacy or Health Dept.or vaccine clinic. Aware to provide a copy of the vaccination record if obtained from local pharmacy or Health Dept. Verbalized acceptance and understanding.  Qualifies for Shingles Vaccine? No   Zostavax completed Yes   Shingrix Completed?: Yes  Screening Tests Health Maintenance  Topic Date Due   COVID-19 Vaccine (4 - 2024-25 season) 07/09/2023   DTaP/Tdap/Td (3 - Td or Tdap) 05/23/2024   Medicare Annual Wellness (AWV)  12/26/2024   Pneumonia Vaccine 72+ Years old  Completed   INFLUENZA VACCINE  Completed   Zoster Vaccines- Shingrix  Completed   HPV VACCINES  Aged Out   Hepatitis C Screening  Discontinued    Health Maintenance  Health Maintenance Due  Topic Date Due   COVID-19 Vaccine (4 - 2024-25 season) 07/09/2023    Colorectal cancer screening: No longer required.   Lung Cancer Screening: (Low Dose CT Chest recommended if Age 78-80 years, 20 pack-year currently smoking OR have quit w/in 15years.) does not qualify.   Lung Cancer Screening Referral:   Additional Screening:  Hepatitis C Screening: does not qualify  Vision Screening: Recommended annual ophthalmology exams for early detection of glaucoma and other disorders of the eye. Is the patient up to date with their annual eye exam?  Yes  Who is the provider or what is the name of the office in which the patient attends annual eye exams? [facility] If pt is not established with a provider, would they like to be referred to a provider to establish care? No .   Dental Screening: Recommended annual dental exams for proper oral hygiene    Community Resource Referral / Chronic Care Management: CRR required this visit?  No   CCM required this visit?  No      Plan:     I have personally reviewed and noted the following in the patient's chart:   Medical and social history Use of alcohol, tobacco or illicit drugs  Current medications and supplements including opioid prescriptions. Patient is not currently taking opioid prescriptions. Functional ability and status Nutritional status Physical activity Advanced directives List of other physicians Hospitalizations,  surgeries, and ER visits in previous 12 months Vitals Screenings to include cognitive, depression, and falls Referrals and appointments  In addition, I have reviewed and discussed with patient certain preventive protocols, quality metrics, and best practice recommendations. A written personalized care plan for preventive services as well as general preventive health recommendations were provided to patient.     Remi Haggard, LPN   1/61/0960   After Visit Summary: (MyChart) Due to this being a telephonic visit, the after visit summary with patients personalized plan was offered to patient via MyChart   Nurse Notes:

## 2024-01-24 ENCOUNTER — Encounter: Payer: Medicare Other | Admitting: Family Medicine

## 2024-06-14 ENCOUNTER — Other Ambulatory Visit: Payer: Self-pay | Admitting: Family Medicine

## 2024-06-14 DIAGNOSIS — E785 Hyperlipidemia, unspecified: Secondary | ICD-10-CM

## 2024-09-25 ENCOUNTER — Encounter: Payer: Self-pay | Admitting: Family Medicine

## 2024-09-25 ENCOUNTER — Ambulatory Visit (INDEPENDENT_AMBULATORY_CARE_PROVIDER_SITE_OTHER): Admitting: Family Medicine

## 2024-09-25 VITALS — BP 124/68 | HR 67 | Temp 98.2°F | Ht 70.5 in | Wt 220.4 lb

## 2024-09-25 DIAGNOSIS — R7303 Prediabetes: Secondary | ICD-10-CM | POA: Diagnosis not present

## 2024-09-25 DIAGNOSIS — E785 Hyperlipidemia, unspecified: Secondary | ICD-10-CM

## 2024-09-25 DIAGNOSIS — Z0000 Encounter for general adult medical examination without abnormal findings: Secondary | ICD-10-CM

## 2024-09-25 LAB — COMPREHENSIVE METABOLIC PANEL WITH GFR
ALT: 24 U/L (ref 0–53)
AST: 28 U/L (ref 0–37)
Albumin: 4.5 g/dL (ref 3.5–5.2)
Alkaline Phosphatase: 71 U/L (ref 39–117)
BUN: 16 mg/dL (ref 6–23)
CO2: 31 meq/L (ref 19–32)
Calcium: 9.3 mg/dL (ref 8.4–10.5)
Chloride: 102 meq/L (ref 96–112)
Creatinine, Ser: 0.99 mg/dL (ref 0.40–1.50)
GFR: 71.3 mL/min (ref >60.00)
Glucose, Bld: 98 mg/dL (ref 70–99)
Potassium: 3.8 meq/L (ref 3.5–5.1)
Sodium: 140 meq/L (ref 135–145)
Total Bilirubin: 0.6 mg/dL (ref 0.2–1.2)
Total Protein: 7.3 g/dL (ref 6.0–8.3)

## 2024-09-25 LAB — LIPID PANEL
Cholesterol: 160 mg/dL (ref 0–200)
HDL: 45.8 mg/dL (ref >39.00)
LDL Cholesterol: 89 mg/dL (ref 0–99)
NonHDL: 114.48
Total CHOL/HDL Ratio: 3
Triglycerides: 125 mg/dL (ref 0.0–149.0)
VLDL: 25 mg/dL (ref 0.0–40.0)

## 2024-09-25 LAB — HEMOGLOBIN A1C: Hgb A1c MFr Bld: 5.9 % (ref 4.6–6.5)

## 2024-09-25 NOTE — Patient Instructions (Addendum)
 I recommend discussing nighttime with urination with your urologist to see if any meds recommended. Tetanus vaccine due at your pharmacy.  Blood pressures look ok. Thank you for coming in today. No change in medications at this time. If there are any concerns on your bloodwork, I will let you know. Take care!  Preventive Care 29 Years and Older, Male Preventive care refers to lifestyle choices and visits with your health care provider that can promote health and wellness. Preventive care visits are also called wellness exams. What can I expect for my preventive care visit? Counseling During your preventive care visit, your health care provider may ask about your: Medical history, including: Past medical problems. Family medical history. History of falls. Current health, including: Emotional well-being. Home life and relationship well-being. Sexual activity. Memory and ability to understand (cognition). Lifestyle, including: Alcohol, nicotine or tobacco, and drug use. Access to firearms. Diet, exercise, and sleep habits. Work and work astronomer. Sunscreen use. Safety issues such as seatbelt and bike helmet use. Physical exam Your health care provider will check your: Height and weight. These may be used to calculate your BMI (body mass index). BMI is a measurement that tells if you are at a healthy weight. Waist circumference. This measures the distance around your waistline. This measurement also tells if you are at a healthy weight and may help predict your risk of certain diseases, such as type 2 diabetes and high blood pressure. Heart rate and blood pressure. Body temperature. Skin for abnormal spots. What immunizations do I need?  Vaccines are usually given at various ages, according to a schedule. Your health care provider will recommend vaccines for you based on your age, medical history, and lifestyle or other factors, such as travel or where you work. What tests do I  need? Screening Your health care provider may recommend screening tests for certain conditions. This may include: Lipid and cholesterol levels. Diabetes screening. This is done by checking your blood sugar (glucose) after you have not eaten for a while (fasting). Hepatitis C test. Hepatitis B test. HIV (human immunodeficiency virus) test. STI (sexually transmitted infection) testing, if you are at risk. Lung cancer screening. Colorectal cancer screening. Prostate cancer screening. Abdominal aortic aneurysm (AAA) screening. You may need this if you are a current or former smoker. Talk with your health care provider about your test results, treatment options, and if necessary, the need for more tests. Follow these instructions at home: Eating and drinking  Eat a diet that includes fresh fruits and vegetables, whole grains, lean protein, and low-fat dairy products. Limit your intake of foods with high amounts of sugar, saturated fats, and salt. Take vitamin and mineral supplements as recommended by your health care provider. Do not drink alcohol if your health care provider tells you not to drink. If you drink alcohol: Limit how much you have to 0-2 drinks a day. Know how much alcohol is in your drink. In the [place] one drink equals one 12 oz bottle of beer (355 mL), one 5 oz glass of wine (148 mL), or one 1 oz glass of hard liquor (44 mL). Lifestyle Brush your teeth every morning and night with fluoride toothpaste. Floss one time each day. Exercise for at least 30 minutes 5 or more days each week. Do not use any products that contain nicotine or tobacco. These products include cigarettes, chewing tobacco, and vaping devices, such as e-cigarettes. If you need help quitting, ask your health care provider. Do not use drugs. If  you are sexually active, practice safe sex. Use a condom or other form of protection to prevent STIs. Take aspirin  only as told by your health care provider. Make sure  that you understand how much to take and what form to take. Work with your health care provider to find out whether it is safe and beneficial for you to take aspirin  daily. Ask your health care provider if you need to take a cholesterol-lowering medicine (statin). Find healthy ways to manage stress, such as: Meditation, yoga, or listening to music. Journaling. Talking to a trusted person. Spending time with friends and family. Safety Always wear your seat belt while driving or riding in a vehicle. Do not drive: If you have been drinking alcohol. Do not ride with someone who has been drinking. When you are tired or distracted. While texting. If you have been using any mind-altering substances or drugs. Wear a helmet and other protective equipment during sports activities. If you have firearms in your house, make sure you follow all gun safety procedures. Minimize exposure to UV radiation to reduce your risk of skin cancer. What's next? Visit your health care provider once a year for an annual wellness visit. Ask your health care provider how often you should have your eyes and teeth checked. Stay up to date on all vaccines. This information is not intended to replace advice given to you by your health care provider. Make sure you discuss any questions you have with your health care provider. Document Revised: 04/21/2021 Document Reviewed: 04/21/2021 Elsevier Patient Education  2024 [employer].

## 2024-09-25 NOTE — Addendum Note (Signed)
 Addended by: LEVORA PURCHASE R on: 09/25/2024 08:32 AM   Modules accepted: Orders

## 2024-09-25 NOTE — Progress Notes (Signed)
 Subjective:  Patient ID: Todd [name], male    DOB: 12/30/1940  Age: 83 y.o. MRN: [id number]  CC:  Chief Complaint  Patient presents with   Annual Exam    Pt is here for annual exam Pt reports no concerns    HPI Todd [name] presents for Annual Exam  PCP: me Neuro/sleep: Dr. Buck, within CPAP, office visit 12/25/2023. Consistent use of CPAP. Sleeping ok. Nocturia at times.  No daytime urinary sx's.  Urology, Dr. Alvaro, Urolithiasis, lower urinary tract symptoms status post TURP in 2022.  Office visit in September 2024.  Plan for 1 year follow-up with renal ultrasound and KUB.  Low threshold for finasteride to prevent regrowth after prior TURP. Recent appt. - no concerns.  Dermatology, Dr. Shona Cardiology, Dr. Verlin, appointment July 2024 with 2-year follow-up. Ophthalmology, Dr. Patrcia  Prediabetes: Diet/exercise approach, weight has increased since February. Some increased restaurant food, less home food prep. Trying to watch food choices.  Lab Results  Component Value Date   HGBA1C 6.0 09/21/2023   Wt Readings from Last 3 Encounters:  09/25/24 220 lb 6 oz (100 kg)  12/25/23 205 lb (93 kg)  09/21/23 213 lb (96.6 kg)   Hyperlipidemia: Pravastatin  20 mg daily.no new myalgia/side effects.  Lab Results  Component Value Date   CHOL 174 09/21/2023   HDL 46.20 09/21/2023   LDLCALC 91 09/21/2023   LDLDIRECT 107.0 03/20/2023   TRIG 184.0 (H) 09/21/2023   CHOLHDL 4 09/21/2023   Lab Results  Component Value Date   ALT 24 09/21/2023   AST 32 09/21/2023   ALKPHOS 102 09/21/2023   BILITOT 1.0 09/21/2023   He has a record of home blood pressure readings, ranging from 117-136/68-80, with heart rates ranging from 65-85     09/25/2024    7:47 AM 12/27/2023    8:59 AM 09/21/2023    8:35 AM 03/20/2023    9:50 AM 12/21/2022    8:39 AM  Depression screen PHQ 2/9  Decreased Interest 0 0 0 0 0  Down, Depressed, Hopeless 0 0 0 0 0  PHQ - 2 Score 0 0 0 0 0  Altered sleeping 0  0 0 0   Tired, decreased energy 0 0 0 0   Change in appetite 0 0 0 0   Feeling bad or failure about yourself  0 0 0 0   Trouble concentrating 0 0 0 0   Moving slowly or fidgety/restless 0 0 0 0   Suicidal thoughts 0 0 0 0   PHQ-9 Score 0 0  0  0    Difficult doing work/chores Not difficult at all Not difficult at all  Not difficult at all      Data saved with a previous flowsheet row definition    Health Maintenance  Topic Date Due   DTaP/Tdap/Td (3 - Td or Tdap) 05/23/2024   COVID-19 Vaccine (4 - 2025-26 season) 07/08/2024   Medicare Annual Wellness (AWV)  12/26/2024   Pneumococcal Vaccine: 50+ Years  Completed   Influenza Vaccine  Completed   Zoster Vaccines- Shingrix  Completed   Meningococcal B Vaccine  Aged Out   Hepatitis C Screening  Discontinued  Followed by urology as above.   Immunization History  Administered Date(s) Administered   Fluad Quad(high Dose 65+) 07/29/2022, 07/19/2023   Fluad Trivalent(High Dose 65+) 08/19/2024   INFLUENZA, HIGH DOSE SEASONAL PF 07/24/2018   Influenza Inj Mdck Quad Pf 08/13/2019   Influenza,inj,Quad PF,6+ Mos 07/15/2013, 07/21/2014, 07/07/2015,  07/06/2016, 08/04/2017   Influenza-Unspecified 08/12/2020   PFIZER(Purple Top)SARS-COV-2 Vaccination 12/15/2019, 01/09/2020, 09/10/2020   Pneumococcal Conjugate-13 08/11/2014   Pneumococcal Polysaccharide-23 03/10/2008, 08/23/2016   Tdap 06/14/2010, 05/23/2014   Zoster Recombinant(Shingrix) 09/11/2017, 02/02/2018   Zoster, Live 11/07/2010  Declines covid booster.   No results found. Optho - Dr Patrcia - appt in December.   Dental: every 6 months.   Alcohol:rare - 1-2 per week.   Tobacco: none  Exercise: resistance and stretching exercises, 5-6 days per week.  Some numbness feeing in feet, cramps at times, for years, no changes, no pain. Has d/w neuro. Stiffness in joints at times. Has seen [facility].    History Patient Active Problem List   Diagnosis Date Noted   Enlarged  prostate with urinary obstruction 03/08/2021   Prediabetes 03/07/2019   Bladder stone 03/20/2017   Nephrolithiasis 03/20/2017   Sensorineural hearing loss (SNHL) of both ears 03/20/2017   OSA on CPAP 02/21/2017   Primary osteoarthritis of both hands 03/01/2016   Mixed hyperlipidemia 03/01/2016   Seasonal allergic rhinitis due to pollen 03/01/2016   BMI 31.0-31.9,adult 03/01/2016   Obesity 03/01/2016   Angioedema 02/16/2013   Past Medical History:  Diagnosis Date   Arthritis    hands   Bladder stone    BPH (benign prostatic hyperplasia)    Cancer (HCC)    skin cancer multiple; followed by [facility]/dermatology yearly.   Colon polyps     Previous polyps on colonoscopy.  [facility].     Hearing aid worn    both ears   Hiatal hernia 10/20/2013   EGD confirmed.  [facility].   History of Barrett's esophagus 2014   pt told by dr rosalie no barrett's esophagus 03-03-2021   History of hypertension    no meds since 2019   History of kidney stones    History of prediabetes    non pre dm since june 2021 weight loss   Hyperlipemia    Neuromuscular disorder (HCC)    numbness rt hand   Seasonal allergies    Sleep apnea    uses CPAP nightly   Past Surgical History:  Procedure Laterality Date   BLADDER STONE REMOVAL  2011, 2018   CARPAL TUNNEL RELEASE  01/17/2012   Procedure: CARPAL TUNNEL RELEASE;  Surgeon: Lamar LULLA Leonor Mickey., MD;  Location: [facility];  Service: Orthopedics;  Laterality: Right;   CERVICAL FUSION  1976   COLONOSCOPY  10/20/2013   [facility].  Polyps.    CYSTOSCOPY WITH LITHOLAPAXY N/A 01/09/2017   Procedure: CYSTOSCOPY WITH LITHOLAPAXY;  Surgeon: Garnette Shack, MD;  Location: [facility];  Service: Urology;  Laterality: N/A;   CYSTOSCOPY WITH LITHOLAPAXY N/A 07/05/2023   Procedure: CYSTOSCOPY WITH LITHOLAPAXY, LASER OF BLADDER STONE;  Surgeon: Alvaro Ricardo KATHEE Mickey., MD;  Location: [facility];  Service: Urology;  Laterality: N/A;   CYSTOSCOPY WITH RETROGRADE  PYELOGRAM, URETEROSCOPY AND STENT PLACEMENT Right 07/05/2023   Procedure: CYSTOSCOPY WITH RETROGRADE PYELOGRAM, URETEROSCOPY AND STENT PLACEMENT, LASER INCISION OF URETEROCELE;  Surgeon: Alvaro Ricardo KATHEE Mickey., MD;  Location: [facility];  Service: Urology;  Laterality: Right;   ESOPHAGOGASTRODUODENOSCOPY  10/20/2013   [facility]; biopsy of area concernig for Barrett's; gastritis.   EYE SURGERY  2012   both cataracts   FRACTURE SURGERY Right 1960's   3 fingers   HOLMIUM LASER APPLICATION N/A 01/09/2017   Procedure: HOLMIUM LASER APPLICATION;  Surgeon: Garnette Shack, MD;  Location: [facility];  Service: Urology;  Laterality: N/A;  HOLMIUM LASER APPLICATION Right 07/05/2023   Procedure: HOLMIUM LASER APPLICATION;  Surgeon: Alvaro Ricardo KATHEE Mickey., MD;  Location: [facility];  Service: Urology;  Laterality: Right;   QUADRICEPS TENDON REPAIR  2003   bilateral   TRANSURETHRAL RESECTION OF PROSTATE N/A 03/08/2021   Procedure: TRANSURETHRAL RESECTION OF THE PROSTATE (TURP);  Surgeon: Matilda Senior, MD;  Location: [facility];  Service: Urology;  Laterality: N/A;   ULNAR NERVE TRANSPOSITION  01/17/2012   Procedure: ULNAR NERVE DECOMPRESSION/TRANSPOSITION;  Surgeon: Lamar LULLA Leonor Mickey., MD;  Location: [facility];  Service: Orthopedics;  Laterality: Right;  decompression of ulnar nerve only  at right cubital tunnel   ULNAR NERVE TRANSPOSITION Left 06/01/2017   Procedure: DECOMPRESSION ULNAR NERVE LEFT CUBITAL TUNNEL;  Surgeon: Murrell Drivers, MD;  Location:  SURGERY CENTER;  Service: Orthopedics;  Laterality: Left;   UPPER GASTROINTESTINAL ENDOSCOPY     VASECTOMY  yrs ago   Allergies  Allergen Reactions   Shellfish Allergy Other (See Comments)    Headaches, high blood pressure   Ace Inhibitors Cough   Amoxicillin Nausea And Vomiting   Darvon     Chest pain   Toprol  Xl [Metoprolol  Tartrate] Other (See Comments)    Not sure reaction   Prior to Admission  medications   Medication Sig Start Date End Date Taking? Authorizing Provider  Digestive Enzymes (PAPAYA AND ENZYMES PO) Take 7 tablets by mouth in the morning and at bedtime. With breakfast and Supper    [provider]  GARLIC PO Take 2,000 mg by mouth in the morning.    [provider]  Multiple Vitamin (MULTIVITAMIN) tablet Take 1 tablet by mouth in the morning.    [provider]  NON FORMULARY Take 2 tablets by mouth in the morning. Super Beets Chewables    [provider]  OMEGA-3 FATTY ACIDS  PO Take 2,000 mg by mouth in the morning.    [provider]  pravastatin  (PRAVACHOL ) 20 MG tablet TAKE 1 TABLET BY MOUTH EVERY DAY CAN START FEW DAYS PER WEEK AND INCREASE IF TOLERATED 06/14/24   Levora Todd SAUNDERS, MD  psyllium (METAMUCIL) 58.6 % powder Take 1 packet by mouth in the morning.    [provider]  Specialty Vitamins Products (BRAIN PO) Take 1 capsule by mouth in the morning.    [provider]   Social History   Socioeconomic History   Marital status: Married    Spouse name: Alesandro Stueve   Number of children: 2   Years of education: 12   Highest education level: 12th grade  Occupational History   Occupation: Retired  Tobacco Use   Smoking status: Former    Current packs/day: 0.00    Average packs/day: 2.0 packs/day for 15.0 years (30.0 ttl pk-yrs)    Types: Cigarettes    Start date: 01/12/1959    Quit date: 01/11/1974    Years since quitting: 50.7   Smokeless tobacco: Never  Vaping Use   Vaping status: Never Used  Substance and Sexual Activity   Alcohol use: Yes    Comment: occ wine   Drug use: No   Sexual activity: Not Currently  Other Topics Concern   Not on file  Social History Narrative   Marital status:  Married x 42 years      Children: 2 daughters; 3 grandchildren.      Employment; retired in 1998 from [employer]; retired age 72 from [employer] after 14 years.  Lives: with wife.       Tobacco: former smoker; quit at age 39.  Smoked 15 years.      Alcohol:  On vacation or special occasions.        Education: [employer].       Exercise: Gym 3-4 times a week for 2 hours.; exercises 7 days per week.   Elliptical for one hour.      Advanced Directives: none; +desires FULL CODE.        ADLs: independent with ADLs; drives.  Does not walk with assistant devices.     Drinks 3-4 caffeine drinks a day    Social Drivers of Corporate Investment Banker Strain: Low Risk  (09/24/2024)   Overall Financial Resource Strain (CARDIA)    Difficulty of Paying Living Expenses: Not hard at all  Food Insecurity: No Food Insecurity (09/24/2024)   Hunger Vital Sign    Worried About Running Out of Food in the Last Year: Never true    Ran Out of Food in the Last Year: Never true  Transportation Needs: No Transportation Needs (09/24/2024)   PRAPARE - Administrator, Civil Service (Medical): No    Lack of Transportation (Non-Medical): No  Physical Activity: Sufficiently Active (09/24/2024)   Exercise Vital Sign    Days of Exercise per Week: 5 days    Minutes of Exercise per Session: 60 min  Stress: No Stress Concern Present (09/24/2024)   [employer] of Occupational Health - Occupational Stress Questionnaire    Feeling of Stress: Not at all  Social Connections: Socially Integrated (09/24/2024)   Social Connection and Isolation Panel    Frequency of Communication with Friends and Family: Once a week    Frequency of Social Gatherings with Friends and Family: Three times a week    Attends Religious Services: More than 4 times per year    Active Member of Clubs or Organizations: Yes    Attends Banker Meetings: More than 4 times per year    Marital Status: Married  Catering Manager Violence: Not At Risk (12/27/2023)   Humiliation, Afraid, Rape, and Kick questionnaire    Fear of Current or Ex-Partner: No    Emotionally Abused: No    Physically Abused: No     Sexually Abused: No    Review of Systems 13 point review of systems per patient health survey noted.  Negative other than as indicated above or in HPI.    Objective:   Vitals:   09/25/24 0749  BP: 124/68  Pulse: 67  Temp: 98.2 F (36.8 C)  SpO2: 98%  Weight: 220 lb 6 oz (100 kg)  Height: 5' 10.5 (1.791 m)     Physical Exam Vitals reviewed.  Constitutional:      Appearance: He is well-developed.  HENT:     Head: Normocephalic and atraumatic.     Right Ear: External ear normal.     Left Ear: External ear normal.  Eyes:     Conjunctiva/sclera: Conjunctivae normal.     Pupils: Pupils are equal, round, and reactive to light.  Neck:     Thyroid : No thyromegaly.  Cardiovascular:     Rate and Rhythm: Normal rate and regular rhythm.     Heart sounds: Normal heart sounds.  Pulmonary:     Effort: Pulmonary effort is normal. No respiratory distress.     Breath sounds: Normal breath sounds. No wheezing.  Abdominal:     General: There is no distension.  Palpations: Abdomen is soft.     Tenderness: There is no abdominal tenderness.  Musculoskeletal:        General: No tenderness. Normal range of motion.     Cervical back: Normal range of motion and neck supple.  Lymphadenopathy:     Cervical: No cervical adenopathy.  Skin:    General: Skin is warm and dry.  Neurological:     Mental Status: He is alert and oriented to person, place, and time.     Deep Tendon Reflexes: Reflexes are normal and symmetric.  Psychiatric:        Behavior: Behavior normal.     Assessment & Plan:  Todd [name] is a 83 y.o. male . Annual physical exam  - -anticipatory guidance as below in AVS, screening labs above. Health maintenance items as above in HPI discussed/recommended as applicable.   Prediabetes  - weight increased.  Continued monitoring of diet, increased restaurant, food outside the home may be contributing.  He is trying to watch food choices.  Check A1c and adjust plan  accordingly.                     Hyperlipidemia, unspecified hyperlipidemia type  -  Stable, tolerating current regimen. Medications recently refilled. Labs pending as above.    No orders of the defined types were placed in this encounter.  Patient Instructions  I recommend discussing nighttime with urination with your urologist to see if any meds recommended. Tetanus vaccine due at your pharmacy.  Blood pressures look ok. Thank you for coming in today. No change in medications at this time. If there are any concerns on your bloodwork, I will let you know. Take care!      Signed,   Todd Pines, MD [facility], [facility] 09/25/24 8:24 AM

## 2024-09-29 ENCOUNTER — Ambulatory Visit: Payer: Self-pay | Admitting: Family Medicine

## 2024-12-06 ENCOUNTER — Other Ambulatory Visit: Payer: Self-pay | Admitting: Family Medicine

## 2024-12-06 DIAGNOSIS — E785 Hyperlipidemia, unspecified: Secondary | ICD-10-CM

## 2024-12-30 ENCOUNTER — Ambulatory Visit: Payer: Medicare Other | Admitting: Adult Health

## 2025-01-07 ENCOUNTER — Ambulatory Visit: Payer: Medicare Other

## 2025-01-08 ENCOUNTER — Ambulatory Visit

## 2025-03-26 ENCOUNTER — Ambulatory Visit: Admitting: Family Medicine
